# Patient Record
Sex: Male | Born: 1940
Health system: Southern US, Community
[De-identification: ages and names within clinical notes are randomized; demographics above are authoritative.]

## PROBLEM LIST (undated history)

## (undated) ENCOUNTER — Ambulatory Visit (HOSPITAL_COMMUNITY): Disposition: A | Discharge status: Home / Self Care | Payer: Self-pay

## (undated) DIAGNOSIS — K635 Polyp of colon: Secondary | ICD-10-CM

## (undated) DIAGNOSIS — R3129 Other microscopic hematuria: Secondary | ICD-10-CM

## (undated) DIAGNOSIS — N3281 Overactive bladder: Secondary | ICD-10-CM

## (undated) DIAGNOSIS — Z720 Tobacco use: Secondary | ICD-10-CM

## (undated) DIAGNOSIS — K5900 Constipation, unspecified: Secondary | ICD-10-CM

## (undated) DIAGNOSIS — IMO0002 Reserved for concepts with insufficient information to code with codable children: Secondary | ICD-10-CM

## (undated) DIAGNOSIS — C801 Malignant (primary) neoplasm, unspecified: Secondary | ICD-10-CM

## (undated) DIAGNOSIS — H269 Unspecified cataract: Secondary | ICD-10-CM

## (undated) DIAGNOSIS — E785 Hyperlipidemia, unspecified: Secondary | ICD-10-CM

## (undated) DIAGNOSIS — I10 Essential (primary) hypertension: Secondary | ICD-10-CM

## (undated) DIAGNOSIS — N400 Enlarged prostate without lower urinary tract symptoms: Secondary | ICD-10-CM

## (undated) DIAGNOSIS — E538 Deficiency of other specified B group vitamins: Secondary | ICD-10-CM

## (undated) DIAGNOSIS — D696 Thrombocytopenia, unspecified: Secondary | ICD-10-CM

## (undated) DIAGNOSIS — E7800 Pure hypercholesterolemia, unspecified: Secondary | ICD-10-CM

## (undated) HISTORY — DX: Reserved for concepts with insufficient information to code with codable children: IMO0002

## (undated) HISTORY — DX: Essential (primary) hypertension: I10

## (undated) HISTORY — PX: COLONOSCOPY: SHX174

## (undated) HISTORY — DX: Overactive bladder: N32.81

## (undated) HISTORY — DX: Constipation, unspecified: K59.00

## (undated) HISTORY — DX: Deficiency of other specified B group vitamins: E53.8

## (undated) HISTORY — DX: Tobacco use: Z72.0

## (undated) HISTORY — DX: Malignant (primary) neoplasm, unspecified: C80.1

## (undated) HISTORY — DX: Hyperlipidemia, unspecified: E78.5

## (undated) HISTORY — DX: Thrombocytopenia, unspecified: D69.6

## (undated) HISTORY — DX: Pure hypercholesterolemia, unspecified: E78.00

## (undated) HISTORY — DX: Benign prostatic hyperplasia without lower urinary tract symptoms: N40.0

## (undated) HISTORY — PX: POLYPECTOMY: SHX149

## (undated) HISTORY — DX: Polyp of colon: K63.5

## (undated) HISTORY — DX: Other microscopic hematuria: R31.29

## (undated) HISTORY — DX: Unspecified cataract: H26.9

## (undated) HISTORY — PX: NOSE SURGERY: SHX723

---

## 1978-06-16 HISTORY — PX: HERNIA REPAIR: SHX51

## 1998-09-11 ENCOUNTER — Ambulatory Visit (HOSPITAL_COMMUNITY): Admission: RE | Admit: 1998-09-11 | Discharge: 1998-09-11 | Payer: Self-pay

## 2001-08-20 ENCOUNTER — Encounter: Payer: Self-pay | Admitting: Family Medicine

## 2001-08-20 ENCOUNTER — Ambulatory Visit (HOSPITAL_COMMUNITY): Admission: RE | Admit: 2001-08-20 | Discharge: 2001-08-20 | Payer: Self-pay | Admitting: Family Medicine

## 2003-10-03 ENCOUNTER — Ambulatory Visit (HOSPITAL_COMMUNITY): Admission: RE | Admit: 2003-10-03 | Discharge: 2003-10-03 | Payer: Self-pay | Admitting: Family Medicine

## 2004-01-26 ENCOUNTER — Ambulatory Visit (HOSPITAL_COMMUNITY): Admission: RE | Admit: 2004-01-26 | Discharge: 2004-01-26 | Payer: Self-pay | Admitting: Family Medicine

## 2004-02-23 ENCOUNTER — Ambulatory Visit (HOSPITAL_COMMUNITY): Admission: RE | Admit: 2004-02-23 | Discharge: 2004-02-23 | Payer: Self-pay | Admitting: Gastroenterology

## 2004-02-23 ENCOUNTER — Encounter (INDEPENDENT_AMBULATORY_CARE_PROVIDER_SITE_OTHER): Payer: Self-pay | Admitting: Specialist

## 2005-06-16 HISTORY — PX: CATARACT EXTRACTION: SUR2

## 2006-10-08 HISTORY — PX: SKIN BIOPSY: SHX1

## 2006-11-10 HISTORY — PX: OTHER SURGICAL HISTORY: SHX169

## 2006-11-24 ENCOUNTER — Encounter: Admission: RE | Admit: 2006-11-24 | Discharge: 2006-12-10 | Payer: Self-pay | Admitting: Orthopedic Surgery

## 2008-02-28 HISTORY — PX: KNEE SURGERY: SHX244

## 2008-03-16 ENCOUNTER — Encounter: Admission: RE | Admit: 2008-03-16 | Discharge: 2008-05-04 | Payer: Self-pay | Admitting: Orthopedic Surgery

## 2010-11-01 NOTE — Op Note (Signed)
NAME:  Lonnie Hurley, Lonnie Hurley                        ACCOUNT NO.:  [id number]   MEDICAL RECORD NO.:  [id number]                   PATIENT TYPE:  AMB   LOCATION:  ENDO                                 FACILITY:  [facility]   PHYSICIAN:  John C. Madilyn Fireman, M.D.                 DATE OF BIRTH:  1941/06/14   DATE OF PROCEDURE:  02/23/2004  DATE OF DISCHARGE:                                 OPERATIVE REPORT   PROCEDURE:  Colonoscopy with polypectomy.   INDICATIONS FOR PROCEDURE:  Heme-positive stools.   PROCEDURE:  The patient was placed in the left lateral decubitus position  and placed on the pulse monitor with continuous low flow oxygen delivered by  nasal cannula.  He was sedated with 87.5 mcg IV fentanyl and 9 mg IV Versed.  The Olympus video colonoscope is inserted into the rectum and advanced to  the cecum, confirmed by transillumination at McBurney's point and  visualization of the ileocecal valve and appendiceal orifice.  Prep is  excellent.  The cecum, ascending, transverse, descending, and sigmoid colon  all appeared normal with no masses, polyps, diverticula, or other mucosal  abnormalities.  At the rectosigmoid junction, there was a smaller polyp that  was fulgurated by hot biopsy.  In the rectum at approximately 10 cm, there  was a second polyp, approximately 5 mm in diameter, that was also fulgurated  by hot biopsy.  The remainder of the rectum appeared normal.  On retroflexed  view, the anus revealed no obvious internal hemorrhoids.  The scope was then  withdrawn, and the patient returned to the recovery room in stable  condition.  He tolerated the procedure well, and there were no immediate  complications.   IMPRESSION:  Rectosigmoid and rectal polyps.   PLAN:  Await histology to determine method and interval for future colon  screening.                                               John C. Madilyn Fireman, M.D.    [facility]MED[facility]  D:  02/23/2004  T:  02/24/2004  Job:  [id number]   cc:   Ernestina Penna, M.D.  [address] [place]  [place] [address]  Fax: [phone]

## 2010-11-08 ENCOUNTER — Encounter: Payer: Self-pay | Admitting: Nurse Practitioner

## 2011-11-21 ENCOUNTER — Telehealth: Payer: Self-pay | Admitting: Oncology

## 2011-11-21 NOTE — Telephone Encounter (Signed)
pt called Lonnie Hurley and scheduled new pt appt for 12/03/2011.  will fax over a letter to Dr. Caryn Bee

## 2011-11-24 ENCOUNTER — Telehealth: Payer: Self-pay | Admitting: Oncology

## 2011-11-24 NOTE — Telephone Encounter (Signed)
Referred by Helene Kelp, PA Dx- Mild-Chronic Thrombocytome

## 2011-12-02 DIAGNOSIS — D696 Thrombocytopenia, unspecified: Secondary | ICD-10-CM | POA: Insufficient documentation

## 2011-12-02 NOTE — Progress Notes (Signed)
Called patient to remind him of appointment tomorrow (12/03/11); patient verbalized understanding.

## 2011-12-02 NOTE — Patient Instructions (Addendum)
Issue:  Low platelet count (thrombocytopenia). Cause:  Most likely due to medication (simvastatin).  I need to rule out other causes such as VitB12 and folate deficiency. Risk of bleeding is low when platelet >30.   Recommend:  [facility] blood count [facility] in about 3 and 6 months.  Follow up in about 9 months.  In the future, if your platelet count decreases to <80, or if you develop anemia or low white count, I may consider bone marrow biopsy to rule out bone marrow failure.

## 2011-12-03 ENCOUNTER — Telehealth: Payer: Self-pay | Admitting: Oncology

## 2011-12-03 ENCOUNTER — Encounter: Payer: Self-pay | Admitting: Oncology

## 2011-12-03 ENCOUNTER — Ambulatory Visit (HOSPITAL_BASED_OUTPATIENT_CLINIC_OR_DEPARTMENT_OTHER): Payer: Medicare Other

## 2011-12-03 ENCOUNTER — Ambulatory Visit (HOSPITAL_BASED_OUTPATIENT_CLINIC_OR_DEPARTMENT_OTHER): Payer: Medicare Other | Admitting: Oncology

## 2011-12-03 ENCOUNTER — Other Ambulatory Visit (HOSPITAL_BASED_OUTPATIENT_CLINIC_OR_DEPARTMENT_OTHER): Payer: Medicare Other | Admitting: Lab

## 2011-12-03 VITALS — BP 124/73 | HR 75 | Temp 96.8°F | Ht 65.0 in | Wt 227.1 lb

## 2011-12-03 DIAGNOSIS — I10 Essential (primary) hypertension: Secondary | ICD-10-CM

## 2011-12-03 DIAGNOSIS — R5383 Other fatigue: Secondary | ICD-10-CM

## 2011-12-03 DIAGNOSIS — E785 Hyperlipidemia, unspecified: Secondary | ICD-10-CM

## 2011-12-03 DIAGNOSIS — D696 Thrombocytopenia, unspecified: Secondary | ICD-10-CM

## 2011-12-03 DIAGNOSIS — M949 Disorder of cartilage, unspecified: Secondary | ICD-10-CM

## 2011-12-03 LAB — CBC WITH DIFFERENTIAL/PLATELET
BASO%: 0.9 % (ref 0.0–2.0)
Eosinophils Absolute: 0.2 10*3/uL (ref 0.0–0.5)
HCT: 40.9 % (ref 38.4–49.9)
HGB: 14.1 g/dL (ref 13.0–17.1)
MCHC: 34.6 g/dL (ref 32.0–36.0)
MONO#: 0.6 10*3/uL (ref 0.1–0.9)
NEUT#: 3.4 10*3/uL (ref 1.5–6.5)
NEUT%: 51.9 % (ref 39.0–75.0)
WBC: 6.5 10*3/uL (ref 4.0–10.3)
lymph#: 2.3 10*3/uL (ref 0.9–3.3)

## 2011-12-03 LAB — VITAMIN B12: Vitamin B-12: 326 pg/mL (ref 211–911)

## 2011-12-03 LAB — TSH: TSH: 1.706 u[IU]/mL (ref 0.350–4.500)

## 2011-12-03 LAB — CHCC SMEAR

## 2011-12-03 LAB — MORPHOLOGY: PLT EST: ADEQUATE

## 2011-12-03 NOTE — Progress Notes (Signed)
[facility]  Telephone:(336) [phone] Fax:(336) [phone]     INITIAL HEMATOLOGY CONSULTATION    Referral MD:   Dr. Rudi Heap, M.D.   Reason for Referral: thrombocytopenia.   HPI: Lonnie Hurley is a 71 year-old man with history of DJD, HTN, HLP on Simvastatin, BPH.  There was no old record provided.  His most recent CBC on 11/13/2011 showed WBC 6.2; Hgb 14; Plt 118.  CMET was completely normal including his LFT.  He was thus kindly referred for evaluation.  Lonnie Hurley presented [facility].  He has chronic bone pain in his bilateral knees (Left worse than right).  He also had mild bone pain diffusely.   He denied fever, erythema, redness in bilateral knees.   Patient denies fever, anorexia, weight loss, fatigue, headache, visual changes, confusion, drenching night sweats, palpable lymph node swelling, mucositis, odynophagia, dysphagia, nausea vomiting, jaundice, chest pain, palpitation, shortness of breath, dyspnea on exertion, productive cough, gum bleeding, epistaxis, hematemesis, hemoptysis, abdominal pain, abdominal swelling, early satiety, melena, hematochezia, hematuria, skin rash, spontaneous bleeding, joint swelling, heat or cold intolerance, bowel bladder incontinence, back pain, focal motor weakness, paresthesia, depression, suicidal or homocidal ideation, feeling hopelessness.    Past Medical History  Diagnosis Date  . Hypercholesteremia   . Microscopic hematuria     negative work up with Urology in the past.   . Hearing loss   . Tinnitus   . DDD (degenerative disc disease)   . Hypertension   . Colon polyps   . BPH (benign prostatic hypertrophy)     Dr. Earlene Plater / Dr. Etta Grandchild  - Urologist   . Thrombocytopenia   . Vitamin d deficiency   . Hyperlipidemia   . Leukoplakia   . Tobacco abuse   :    Past Surgical History  Procedure Date  . Left knee repair   . Hernia repair 1980  . Nose surgery   :   CURRENT  MEDS: Current Outpatient Prescriptions  Medication Sig Dispense Refill  . Cholecalciferol (VITAMIN D3) 2000 UNITS TABS Take by mouth.        . CVS CALCIUM-MAGNESIUM-ZINC PO Take by mouth daily.      Marland Kitchen ezetimibe (ZETIA) 10 MG tablet Take 10 mg by mouth daily.        Marland Kitchen GLUCOSAMINE PO Take by mouth daily.        . hydrochlorothiazide 25 MG tablet Take 12.5 mg by mouth daily.       . Multiple Vitamins-Minerals (MULTIVITAMIN WITH MINERALS) tablet Take 1 tablet by mouth daily.      . niacin (NIASPAN) 1000 MG CR tablet Take 1,000 mg by mouth at bedtime.        . Omega-3 Fatty Acids (FISH OIL) 1000 MG CAPS Take by mouth.      . ramipril (ALTACE) 10 MG tablet Take 10 mg by mouth at bedtime as needed.        . simvastatin (ZOCOR) 40 MG tablet Take 40 mg by mouth at bedtime.        . Tamsulosin HCl (FLOMAX) 0.4 MG CAPS Take by mouth at bedtime.            Allergies  Allergen Reactions  . Aspirin Other (See Comments)    Causes Blood in urine  . Augmentin Es-600 (Amoxicillin-Pot Clavulanate)   . Crestor (Rosuvastatin Calcium)     Myalgia   . Hydrocodone     Constipation    .  Penicillins   :  Family History  Problem Relation Age of Onset  . Heart failure Father   :  History   Social History  . Marital Status: Single    Spouse Name: N/A    Number of Children: 0  . Years of Education: N/A   Occupational History  .      retired Chartered loss adjuster   Social History Main Topics  . Smoking status: Former Smoker -- 50 years    Types: Cigarettes    Quit date: 08/14/2009  . Smokeless tobacco: Not on file  . Alcohol Use: No  . Drug Use: No  . Sexually Active: Not on file   Other Topics Concern  . Not on file   Social History Narrative  . No narrative on file  :  REVIEW OF SYSTEM:  The rest of the 14-point review of sytem was negative.   Exam: ECOG 0.   General:  Mildly obese man,  in no acute distress.  Eyes:  no scleral icterus.  ENT:  There were no oropharyngeal  lesions.  Neck was without thyromegaly.  Lymphatics:  Negative cervical, supraclavicular or axillary adenopathy.  Respiratory: lungs were clear bilaterally without wheezing or crackles.  Cardiovascular:  Regular rate and rhythm, S1/S2, without murmur, rub or gallop.  There was no pedal edema.  GI:  abdomen was soft, flat, nontender, nondistended, without organomegaly.  Muscoloskeletal:  no spinal tenderness of palpation of vertebral spine.  Skin exam was without echymosis, petichae.  Neuro exam was nonfocal.  Patient was able to get on and off exam table without assistance.  Gait was normal.  Patient was alerted and oriented.  Attention was good.   Language was appropriate.  Mood was normal without depression.  Speech was not pressured.  Thought content was not tangential.    LABS:  Lab Results  Component Value Date   WBC 6.5 12/03/2011   HGB 14.1 12/03/2011   HCT 40.9 12/03/2011   PLT 143 Platelet count consistent in citrate 12/03/2011      Blood smear review:   I personally reviewed the patient's peripheral blood smear today.  There was isocytosis.  There was no peripheral blast.  There was no schistocytosis, spherocytosis, target cell, rouleaux formation, tear drop cell.  There was no giant platelets or platelet clumps.     ASSESSMENT AND PLAN:   1.  Thrombocytopenia:   It is resolved today.   Cause:  Most likely due to medication (simvastatin).  I need to rule out other causes such as VitB12 and folate deficiency.  I sent for these but they came back normal. I cannot rule out early stage myelodysplastic syndrome but this is low on my differential.  I discussed with patient that risk of spontaneous bleeding is low when >30K.   Recommend:  - Continue with Simvastatin for now.  -  Follow blood count here at the Cancer Center in about 3 and 6 months.   - Follow up in about 9 months.  In the future, if your platelet count decreases to <80, or if you develop anemia or low white count, I may  consider bone marrow biopsy to rule out bone marrow failure.   2.  Hyperlipidemia:  Continue Zocor, Zetia, Niacin, and Omega-3.  I do not advocate changing this at this time since his thrombocytopenia has been very mild and has resolved today.   3.  Hypertension:  Well controlled on ramipril.  4.  BPH:  Symptoms improved with  Tamsulosin.    Thank you for this referral.     The length of time of the face-to-face encounter was 30  minutes. More than 50% of time was spent counseling and coordination of care.

## 2011-12-03 NOTE — Telephone Encounter (Signed)
appts made and printed for pt aom °

## 2012-03-04 ENCOUNTER — Other Ambulatory Visit (HOSPITAL_BASED_OUTPATIENT_CLINIC_OR_DEPARTMENT_OTHER): Payer: Medicare Other | Admitting: Lab

## 2012-03-04 DIAGNOSIS — R5383 Other fatigue: Secondary | ICD-10-CM

## 2012-03-04 DIAGNOSIS — R5381 Other malaise: Secondary | ICD-10-CM

## 2012-03-04 DIAGNOSIS — D696 Thrombocytopenia, unspecified: Secondary | ICD-10-CM

## 2012-03-04 LAB — CBC WITH DIFFERENTIAL/PLATELET
BASO%: 0.8 % (ref 0.0–2.0)
Basophils Absolute: 0.1 10*3/uL (ref 0.0–0.1)
EOS%: 2.9 % (ref 0.0–7.0)
HCT: 40.4 % (ref 38.4–49.9)
HGB: 13.7 g/dL (ref 13.0–17.1)
LYMPH%: 36 % (ref 14.0–49.0)
MCH: 30.2 pg (ref 27.2–33.4)
MCHC: 33.9 g/dL (ref 32.0–36.0)
MONO#: 1.1 10*3/uL — ABNORMAL HIGH (ref 0.1–0.9)
NEUT%: 48.7 % (ref 39.0–75.0)
Platelets: 159 10*3/uL (ref 140–400)
lymph#: 3.3 10*3/uL (ref 0.9–3.3)

## 2012-03-09 ENCOUNTER — Telehealth: Payer: Self-pay

## 2012-03-09 NOTE — Telephone Encounter (Signed)
Message copied by Kallie Locks on Tue Mar 09, 2012  3:24 PM ------      Message from: Jethro Bolus T      Created: Tue Mar 09, 2012  9:10 AM       Please call patient. His platelet count has normalized. His previous thrombocytopenia was most likely due to medication-induced such as simvastatin. Since his platelet is normal now; no need to do anything. I recommend to continue observation at this time.

## 2012-06-03 ENCOUNTER — Telehealth: Payer: Self-pay | Admitting: *Deleted

## 2012-06-03 ENCOUNTER — Other Ambulatory Visit (HOSPITAL_BASED_OUTPATIENT_CLINIC_OR_DEPARTMENT_OTHER): Payer: Medicare Other

## 2012-06-03 DIAGNOSIS — R5383 Other fatigue: Secondary | ICD-10-CM

## 2012-06-03 DIAGNOSIS — D696 Thrombocytopenia, unspecified: Secondary | ICD-10-CM

## 2012-06-03 LAB — CBC WITH DIFFERENTIAL/PLATELET
BASO%: 0.9 % (ref 0.0–2.0)
Basophils Absolute: 0.1 10*3/uL (ref 0.0–0.1)
EOS%: 2.9 % (ref 0.0–7.0)
HCT: 43.5 % (ref 38.4–49.9)
HGB: 14.8 g/dL (ref 13.0–17.1)
MCH: 30.6 pg (ref 27.2–33.4)
MCHC: 34 g/dL (ref 32.0–36.0)
MCV: 90 fL (ref 79.3–98.0)
MONO%: 9.4 % (ref 0.0–14.0)
NEUT%: 47.8 % (ref 39.0–75.0)
lymph#: 3.3 10*3/uL (ref 0.9–3.3)

## 2012-06-03 NOTE — Telephone Encounter (Signed)
Message copied by PORTER, Shanta Dorvil C on Thu Jun 03, 2012  5:21 PM ------      Message from: HA, HUAN T      Created: Thu Jun 03, 2012  2:12 PM       Please call Lonnie Hurley.  His mild thrombocytopenia is still there. Not low enough for any work up.  Again, most likely due to medications.  Continue observation.  Thanks. 

## 2012-06-03 NOTE — Telephone Encounter (Signed)
Message copied by Wende Mott on Thu Jun 03, 2012  5:21 PM ------      Message from: HA, Raliegh Ip T      Created: Thu Jun 03, 2012  2:12 PM       Please call pt.  His mild thrombocytopenia is still there. Not low enough for any work up.  Again, most likely due to medications.  Continue observation.  Thanks.

## 2012-06-03 NOTE — Telephone Encounter (Signed)
Called pt w/ lab results,  Relayed Dr. Lodema Pilot message and instructed to keep lab as scheduled in March.  He verbalized understanding.

## 2012-09-01 ENCOUNTER — Encounter: Payer: Self-pay | Admitting: Oncology

## 2012-09-01 ENCOUNTER — Telehealth: Payer: Self-pay | Admitting: Oncology

## 2012-09-01 ENCOUNTER — Ambulatory Visit (HOSPITAL_BASED_OUTPATIENT_CLINIC_OR_DEPARTMENT_OTHER): Payer: Medicare Other | Admitting: Oncology

## 2012-09-01 ENCOUNTER — Other Ambulatory Visit (HOSPITAL_BASED_OUTPATIENT_CLINIC_OR_DEPARTMENT_OTHER): Payer: Medicare Other | Admitting: Lab

## 2012-09-01 VITALS — BP 123/80 | HR 78 | Temp 97.2°F | Resp 22 | Ht 65.0 in | Wt 235.2 lb

## 2012-09-01 DIAGNOSIS — D696 Thrombocytopenia, unspecified: Secondary | ICD-10-CM

## 2012-09-01 DIAGNOSIS — R5383 Other fatigue: Secondary | ICD-10-CM

## 2012-09-01 LAB — CBC WITH DIFFERENTIAL/PLATELET
BASO%: 0.7 % (ref 0.0–2.0)
EOS%: 2.8 % (ref 0.0–7.0)
MCH: 30.5 pg (ref 27.2–33.4)
MCHC: 34.4 g/dL (ref 32.0–36.0)
MCV: 88.4 fL (ref 79.3–98.0)
MONO%: 11.8 % (ref 0.0–14.0)
RBC: 4.76 10*6/uL (ref 4.20–5.82)
RDW: 13.4 % (ref 11.0–14.6)
lymph#: 3 10*3/uL (ref 0.9–3.3)

## 2012-09-01 LAB — COMPREHENSIVE METABOLIC PANEL (CC13)
ALT: 27 U/L (ref 0–55)
Albumin: 3.5 g/dL (ref 3.5–5.0)
Alkaline Phosphatase: 65 U/L (ref 40–150)
CO2: 28 mEq/L (ref 22–29)
Glucose: 106 mg/dl — ABNORMAL HIGH (ref 70–99)
Potassium: 4.4 mEq/L (ref 3.5–5.1)
Sodium: 141 mEq/L (ref 136–145)
Total Bilirubin: 0.77 mg/dL (ref 0.20–1.20)
Total Protein: 6.8 g/dL (ref 6.4–8.3)

## 2012-09-01 LAB — CHCC SMEAR

## 2012-09-01 NOTE — Progress Notes (Signed)
[facility]  Telephone:(336) [phone] Fax:(336) [phone]   OFFICE PROGRESS NOTE   Cc:  Rudi Heap, MD  DIAGNOSIS: Thrombocytopenia  CURRENT THERAPY: Watchful observation.  INTERVAL HISTORY: PADRAIC MARINOS 72 y.o. male returns for routine follow-up by himself. He continues to have chronic bone pain to his bilateral knees. No fatigue, CP, SOB, DOE. Denies any bleeding. No palpable adenopathy.  Patient denies fever, anorexia, weight loss, fatigue, headache, visual changes, confusion, drenching night sweats, palpable lymph node swelling, mucositis, odynophagia, dysphagia, nausea vomiting, jaundice, chest pain, palpitation, shortness of breath, dyspnea on exertion, productive cough, gum bleeding, epistaxis, hematemesis, hemoptysis, abdominal pain, abdominal swelling, early satiety, melena, hematochezia, hematuria, skin rash, spontaneous bleeding, joint swelling, heat or cold intolerance, bowel bladder incontinence, back pain, focal motor weakness, paresthesia, depression, suicidal or homocidal ideation, feeling hopelessness.   Past Medical History  Diagnosis Date  . Hypercholesteremia   . Microscopic hematuria     negative work up with Urology in the past.   . Hearing loss   . Tinnitus   . DDD (degenerative disc disease)   . Hypertension   . Colon polyps   . BPH (benign prostatic hypertrophy)     Dr. Earlene Plater / Dr. Etta Grandchild  - Urologist   . Thrombocytopenia   . Vitamin D deficiency   . Hyperlipidemia   . Leukoplakia   . Tobacco abuse     Past Surgical History  Procedure Laterality Date  . Left knee repair    . Hernia repair  1980  . Nose surgery      Current Outpatient Prescriptions  Medication Sig Dispense Refill  . mirabegron ER (MYRBETRIQ) 25 MG TB24 Take 25 mg by mouth daily.      . Cholecalciferol (VITAMIN D3) 2000 UNITS TABS Take by mouth.        . CVS CALCIUM-MAGNESIUM-ZINC PO Take by mouth daily.      Marland Kitchen ezetimibe (ZETIA) 10 MG tablet Take 10 mg by  mouth daily.        . hydrochlorothiazide 25 MG tablet Take 12.5 mg by mouth daily.       . Multiple Vitamins-Minerals (MULTIVITAMIN WITH MINERALS) tablet Take 1 tablet by mouth daily.      . niacin (NIASPAN) 1000 MG CR tablet Take 1,000 mg by mouth at bedtime.        . Omega-3 Fatty Acids (FISH OIL) 1000 MG CAPS Take by mouth.      . ramipril (ALTACE) 10 MG tablet Take 10 mg by mouth at bedtime as needed.        . simvastatin (ZOCOR) 40 MG tablet Take 40 mg by mouth at bedtime.        . Tamsulosin HCl (FLOMAX) 0.4 MG CAPS Take by mouth at bedtime.         No current facility-administered medications for this visit.    ALLERGIES:  is allergic to aspirin; augmentin es-600; crestor; hydrocodone; and penicillins.  REVIEW OF SYSTEMS:  The rest of the 14-point review of system was negative.   Filed Vitals:   09/01/12 1033  BP: 123/80  Pulse: 78  Temp: 97.2 F (36.2 C)  Resp: 22   Wt Readings from Last 3 Encounters:  09/01/12 235 lb 3.2 oz (106.686 kg)  12/03/11 227 lb 1.6 oz (103.012 kg)   ECOG Performance status: 0  PHYSICAL EXAMINATION:   General:  well-nourished in no acute distress.  Eyes:  no scleral icterus.  ENT:  There were no oropharyngeal lesions.  Neck was without thyromegaly.  Lymphatics:  Negative cervical, supraclavicular or axillary adenopathy.  Respiratory: lungs were clear bilaterally without wheezing or crackles.  Cardiovascular:  Regular rate and rhythm, S1/S2, without murmur, rub or gallop.  There was no pedal edema.  GI:  abdomen was soft, flat, nontender, nondistended, without organomegaly.  Muscoloskeletal:  no spinal tenderness of palpation of vertebral spine.  Skin exam was without echymosis, petichae.  Neuro exam was nonfocal.  Patient was able to get on and off exam table without assistance.  Gait was normal.  Patient was alerted and oriented.  Attention was good.   Language was appropriate.  Mood was normal without depression.  Speech was not pressured.  Thought  content was not tangential.     LABORATORY/RADIOLOGY DATA:  Lab Results  Component Value Date   WBC 8.1 09/01/2012   HGB 14.5 09/01/2012   HCT 42.1 09/01/2012   PLT 132* 09/01/2012   GLUCOSE 106* 09/01/2012   ALKPHOS 65 09/01/2012   ALT 27 09/01/2012   AST 22 09/01/2012   NA 141 09/01/2012   K 4.4 09/01/2012   CL 106 09/01/2012   CREATININE 1.1 09/01/2012   BUN 12.0 09/01/2012   CO2 28 09/01/2012    ASSESSMENT AND PLAN:   1. Thrombocytopenia:   Cause: Most likely due to medication (simvastatin). I cannot rule out early stage myelodysplastic syndrome but this is low on my differential. I discussed with patient that risk of spontaneous bleeding is low when >30K.  Recommend:  - Continue with Simvastatin for now.  - Follow blood count here at the Cancer Center in about 3 and 6 and 9 months.  - Follow up in about 1 year. In the future, if your platelet count decreases to <80, or if you develop anemia or low white count, I may consider bone marrow biopsy to rule out bone marrow failure.  2. Hyperlipidemia: Continue Zocor, Zetia, Niacin, and Omega-3. I do not advocate changing this at this time since his thrombocytopenia has been very mild and has resolved today.  3. Hypertension: Well controlled on ramipril.  4. BPH: Symptoms improved with Tamsulosin.       The length of time of the face-to-face encounter was 15 minutes. More than 50% of time was spent counseling and coordination of care.

## 2012-10-14 ENCOUNTER — Ambulatory Visit: Payer: Self-pay | Admitting: Nurse Practitioner

## 2012-11-15 ENCOUNTER — Other Ambulatory Visit (INDEPENDENT_AMBULATORY_CARE_PROVIDER_SITE_OTHER): Payer: Medicare Other

## 2012-11-15 DIAGNOSIS — I10 Essential (primary) hypertension: Secondary | ICD-10-CM

## 2012-11-15 DIAGNOSIS — E785 Hyperlipidemia, unspecified: Secondary | ICD-10-CM

## 2012-11-15 DIAGNOSIS — E559 Vitamin D deficiency, unspecified: Secondary | ICD-10-CM

## 2012-11-15 LAB — COMPLETE METABOLIC PANEL WITH GFR
AST: 20 U/L (ref 0–37)
Alkaline Phosphatase: 52 U/L (ref 39–117)
BUN: 16 mg/dL (ref 6–23)
GFR, Est Non African American: 83 mL/min
Glucose, Bld: 104 mg/dL — ABNORMAL HIGH (ref 70–99)
Total Bilirubin: 0.7 mg/dL (ref 0.3–1.2)

## 2012-11-15 NOTE — Progress Notes (Signed)
Patient came in for labs only.

## 2012-11-16 LAB — NMR LIPOPROFILE WITH LIPIDS
Cholesterol, Total: 120 mg/dL (ref <200)
HDL Size: 9.2 nm (ref >=9.2)
HDL-C: 43 mg/dL (ref >=40)
LDL (calc): 54 mg/dL (ref <100)
LDL Particle Number: 950 nmol/L (ref <1000)
LP-IR Score: 50 — ABNORMAL HIGH (ref <=45)
Triglycerides: 113 mg/dL (ref <150)
VLDL Size: 48 nm — ABNORMAL HIGH (ref <=46.6)

## 2012-11-16 LAB — VITAMIN D 25 HYDROXY (VIT D DEFICIENCY, FRACTURES): Vit D, 25-Hydroxy: 38 ng/mL (ref 30–89)

## 2012-11-17 ENCOUNTER — Ambulatory Visit (INDEPENDENT_AMBULATORY_CARE_PROVIDER_SITE_OTHER): Payer: Medicare Other | Admitting: Nurse Practitioner

## 2012-11-17 ENCOUNTER — Encounter: Payer: Self-pay | Admitting: Nurse Practitioner

## 2012-11-17 VITALS — BP 124/79 | HR 82 | Temp 96.7°F | Ht 65.0 in | Wt 239.0 lb

## 2012-11-17 DIAGNOSIS — M5136 Other intervertebral disc degeneration, lumbar region: Secondary | ICD-10-CM | POA: Insufficient documentation

## 2012-11-17 DIAGNOSIS — I10 Essential (primary) hypertension: Secondary | ICD-10-CM

## 2012-11-17 DIAGNOSIS — IMO0002 Reserved for concepts with insufficient information to code with codable children: Secondary | ICD-10-CM

## 2012-11-17 DIAGNOSIS — E785 Hyperlipidemia, unspecified: Secondary | ICD-10-CM

## 2012-11-17 NOTE — Progress Notes (Signed)
Subjective:    Patient ID: Lonnie Hurley, male    DOB: [birth year], 72 y.o.   MRN: [id number]  Hypertension This is a chronic problem. The current episode started more than 1 year ago. The problem has been resolved since onset. The problem is controlled. Pertinent negatives include no chest pain, malaise/fatigue, peripheral edema or shortness of breath. There are no associated agents to hypertension. Risk factors for coronary artery disease include dyslipidemia, obesity, post-menopausal state and family history. Past treatments include ACE inhibitors and diuretics. The current treatment provides moderate improvement. Compliance problems include diet and exercise.   Hyperlipidemia This is a chronic problem. The current episode started more than 1 year ago. The problem is controlled. Recent lipid tests were reviewed and are normal. There are no known factors aggravating his hyperlipidemia. Pertinent negatives include no chest pain, leg pain, myalgias or shortness of breath. Current antihyperlipidemic treatment includes ezetimibe and statins. The current treatment provides moderate improvement of lipids. Compliance problems include adherence to diet and adherence to exercise.  Risk factors for coronary artery disease include male sex, obesity and hypertension.  BPH flomax daily- no c/o uregncy or dificulty with stream- No nocturia    Review of Systems  Constitutional: Negative for malaise/fatigue.  Respiratory: Negative for shortness of breath.   Cardiovascular: Negative for chest pain.  Musculoskeletal: Negative for myalgias.  All other systems reviewed and are negative.       Objective:   Physical Exam  Constitutional: He is oriented to person, place, and time. He appears well-developed and well-nourished.  HENT:  Head: Normocephalic.  Right Ear: External ear normal.  Left Ear: External ear normal.  Nose: Nose normal.  Mouth/Throat: Oropharynx is clear and moist.  Eyes: EOM are  normal. Pupils are equal, round, and reactive to light.  Neck: Normal range of motion. Neck supple. No thyromegaly present.  Cardiovascular: Normal rate, regular rhythm, normal heart sounds and intact distal pulses.   No murmur heard. Pulmonary/Chest: Effort normal and breath sounds normal. He has no wheezes. He has no rales.  Abdominal: Soft. Bowel sounds are normal.  Genitourinary:  Patient refuses DRE   Musculoskeletal: Normal range of motion.  Neurological: He is alert and oriented to person, place, and time.  Skin: Skin is warm and dry.  Psychiatric: He has a normal mood and affect. His behavior is normal. Judgment and thought content normal.    BP 124/79  Pulse 82  Temp(Src) 96.7 F (35.9 C) (Oral)  Ht 5\' 5"  (1.651 m)  Wt 239 lb (108.41 kg)  BMI 39.77 kg/m2       Assessment & Plan:   1. Hypertension   2. Hyperlipidemia   3. DDD (degenerative disc disease)    Labs reviewed at appointment   Medication List       These changes are accurate as of: 11/17/2012  2:55 PM. If you have any questions, ask your nurse or doctor.          TAKE these medications       CVS CALCIUM-MAGNESIUM-ZINC PO  Take by mouth daily.     ezetimibe 10 MG tablet  Commonly known as:  ZETIA  Take 10 mg by mouth daily.     Fish Oil 1000 MG Caps  Take by mouth.     FLOMAX 0.4 MG Caps  Generic drug:  tamsulosin  Take by mouth at bedtime.     hydrochlorothiazide 25 MG tablet  Commonly known as:  HYDRODIURIL  Take 12.5 mg by mouth  daily.     multivitamin with minerals tablet  Take 1 tablet by mouth daily.     MYRBETRIQ 25 MG Tb24  Generic drug:  mirabegron ER  Take 25 mg by mouth daily.     niacin 1000 MG CR tablet  Commonly known as:  NIASPAN  Take 1,000 mg by mouth at bedtime.     ramipril 10 MG tablet  Commonly known as:  ALTACE  Take 10 mg by mouth at bedtime as needed.     simvastatin 40 MG tablet  Commonly known as:  ZOCOR  Take 40 mg by mouth at bedtime.      Vitamin D3 2000 UNITS Tabs  Take by mouth.       Continue all meds  Labs reviewed at appointment Diet encouraged [place] cards given to patient Mary-Margaret Daphine Deutscher, FNP

## 2012-11-17 NOTE — Patient Instructions (Addendum)
Health Maintenance, Males A healthy lifestyle and preventative care can promote health and wellness.  Maintain regular health, dental, and eye exams.  Eat a healthy diet. Foods like vegetables, fruits, whole grains, low-fat dairy products, and lean protein foods contain the nutrients you need without too many calories. Decrease your intake of foods high in solid fats, added sugars, and salt. Get information about a proper diet from your caregiver, if necessary.  Regular physical exercise is one of the most important things you can do for your health. Most adults should get at least 150 minutes of moderate-intensity exercise (any activity that increases your heart rate and causes you to sweat) each week. In addition, most adults need muscle-strengthening exercises on 2 or more days a week.   Maintain a healthy weight. The body mass index (BMI) is a screening tool to identify possible weight problems. It provides an estimate of body fat based on height and weight. Your caregiver can help determine your BMI, and can help you achieve or maintain a healthy weight. For adults 20 years and older:  A BMI below 18.5 is considered underweight.  A BMI of 18.5 to 24.9 is normal.  A BMI of 25 to 29.9 is considered overweight.  A BMI of 30 and above is considered obese.  Maintain normal blood lipids and cholesterol by exercising and minimizing your intake of saturated fat. Eat a balanced diet with plenty of fruits and vegetables. Blood tests for lipids and cholesterol should begin at age 20 and be repeated every 5 years. If your lipid or cholesterol levels are high, you are over 50, or you are a high risk for heart disease, you may need your cholesterol levels checked more frequently.Ongoing high lipid and cholesterol levels should be treated with medicines, if diet and exercise are not effective.  If you smoke, find out from your caregiver how to quit. If you do not use tobacco, do not start.  If you  choose to drink alcohol, do not exceed 2 drinks per day. One drink is considered to be 12 ounces (355 mL) of beer, 5 ounces (148 mL) of wine, or 1.5 ounces (44 mL) of liquor.  Avoid use of street drugs. Do not share needles with anyone. Ask for help if you need support or instructions about stopping the use of drugs.  High blood pressure causes heart disease and increases the risk of stroke. Blood pressure should be checked at least every 1 to 2 years. Ongoing high blood pressure should be treated with medicines if weight loss and exercise are not effective.  If you are 45 to 70 years old, ask your caregiver if you should take aspirin to prevent heart disease.  Diabetes screening involves taking a blood sample to check your fasting blood sugar level. This should be done once every 3 years, after age 45, if you are within normal weight and without risk factors for diabetes. Testing should be considered at a younger age or be carried out more frequently if you are overweight and have at least 1 risk factor for diabetes.  Colorectal cancer can be detected and often prevented. Most routine colorectal cancer screening begins at the age of 50 and continues through age 75. However, your caregiver may recommend screening at an earlier age if you have risk factors for colon cancer. On a yearly basis, your caregiver may provide home test kits to check for hidden blood in the stool. Use of a small camera at the end of a tube,   to directly examine the colon (sigmoidoscopy or colonoscopy), can detect the earliest forms of colorectal cancer. Talk to your caregiver about this at age 50, when routine screening begins. Direct examination of the colon should be repeated every 5 to 10 years through age 75, unless early forms of pre-cancerous polyps or small growths are found.  Hepatitis C blood testing is recommended for all people born from 1945 through 1965 and any individual with known risks for hepatitis C.  Healthy  men should no longer receive prostate-specific antigen (PSA) blood tests as part of routine cancer screening. Consult with your caregiver about prostate cancer screening.  Testicular cancer screening is not recommended for adolescents or adult males who have no symptoms. Screening includes self-exam, caregiver exam, and other screening tests. Consult with your caregiver about any symptoms you have or any concerns you have about testicular cancer.  Practice safe sex. Use condoms and avoid high-risk sexual practices to reduce the spread of sexually transmitted infections (STIs).  Use sunscreen with a sun protection factor (SPF) of 30 or greater. Apply sunscreen liberally and repeatedly throughout the day. You should seek shade when your shadow is shorter than you. Protect yourself by wearing long sleeves, pants, a wide-brimmed hat, and sunglasses year round, whenever you are outdoors.  Notify your caregiver of new moles or changes in moles, especially if there is a change in shape or color. Also notify your caregiver if a mole is larger than the size of a pencil eraser.  A one-time screening for abdominal aortic aneurysm (AAA) and surgical repair of large AAAs by sound wave imaging (ultrasonography) is recommended for ages 65 to 75 years who are current or former smokers.  Stay current with your immunizations. Document Released: 11/29/2007 Document Revised: 08/25/2011 Document Reviewed: 10/28/2010 Exit[employer] Patient Information 2014 [employer].  

## 2012-11-19 ENCOUNTER — Other Ambulatory Visit (INDEPENDENT_AMBULATORY_CARE_PROVIDER_SITE_OTHER): Payer: Medicare Other

## 2012-11-19 DIAGNOSIS — Z1212 Encounter for screening for malignant neoplasm of rectum: Secondary | ICD-10-CM

## 2012-12-02 ENCOUNTER — Other Ambulatory Visit (HOSPITAL_BASED_OUTPATIENT_CLINIC_OR_DEPARTMENT_OTHER): Payer: Medicare Other

## 2012-12-02 DIAGNOSIS — D696 Thrombocytopenia, unspecified: Secondary | ICD-10-CM

## 2012-12-02 LAB — CBC WITH DIFFERENTIAL/PLATELET
BASO%: 0.7 % (ref 0.0–2.0)
EOS%: 2.3 % (ref 0.0–7.0)
HCT: 41.2 % (ref 38.4–49.9)
LYMPH%: 33.2 % (ref 14.0–49.0)
MCH: 31 pg (ref 27.2–33.4)
MCHC: 35.4 g/dL (ref 32.0–36.0)
MCV: 87.3 fL (ref 79.3–98.0)
MONO#: 1 10*3/uL — ABNORMAL HIGH (ref 0.1–0.9)
MONO%: 13.1 % (ref 0.0–14.0)
NEUT%: 50.7 % (ref 39.0–75.0)
Platelets: 134 10*3/uL — ABNORMAL LOW (ref 140–400)

## 2012-12-06 ENCOUNTER — Encounter: Payer: Self-pay | Admitting: Physician Assistant

## 2012-12-06 ENCOUNTER — Ambulatory Visit (INDEPENDENT_AMBULATORY_CARE_PROVIDER_SITE_OTHER): Payer: Medicare Other | Admitting: Physician Assistant

## 2012-12-06 ENCOUNTER — Telehealth: Payer: Self-pay

## 2012-12-06 VITALS — BP 140/94 | HR 80 | Temp 96.8°F | Ht 65.0 in | Wt 227.0 lb

## 2012-12-06 DIAGNOSIS — W57XXXA Bitten or stung by nonvenomous insect and other nonvenomous arthropods, initial encounter: Secondary | ICD-10-CM

## 2012-12-06 MED ORDER — DOXYCYCLINE HYCLATE 100 MG PO TABS: 100.0000 mg | ORAL_TABLET | Freq: Two times a day (BID) | ORAL | Status: DC

## 2012-12-06 NOTE — Patient Instructions (Signed)
Deer Tick Bite Deer ticks are brown arachnids (spider family) that vary in size from as small as the head of a pin to 1/4 inch (1/2 cm) diameter. They thrive in wooded areas. Deer are the preferred host of adult deer ticks. Small rodents are the host of young ticks (nymphs). When a person walks in a field or wooded area, young and adult ticks in the surrounding grass and vegetation can attach themselves to the skin. They can suck blood for hours to days if unnoticed. Ticks are found all over the [place] Some ticks carry a specific bacteria (Borrelia burgdorferi) that causes an infection called Lyme disease. The bacteria is typically passed into a person during the blood sucking process. This happens after the tick has been attached for at least a number of hours. While ticks can be found all over the [place] those carrying the bacteria that causes Lyme disease are most common in [place] and the [place]. Only a small proportion of ticks in these areas carry the Lyme disease bacteria and cause human infections. Ticks usually attach to warm spots on the body, such as the:  Head.  Back.  Neck.  Armpits.  Groin. SYMPTOMS  Most of the time, a deer tick bite will not be felt. You may or may not see the attached tick. You may notice mild irritation or redness around the bite site. If the deer tick passes the Lyme disease bacteria to a person, a round, red rash may be noticed 2 to 3 days after the bite. The rash may be clear in the middle, like a bull's-eye or target. If not treated, other symptoms may develop several days to weeks after the onset of the rash. These symptoms may include:  New rash lesions.  Fatigue and weakness.  General ill feeling and achiness.  Chills.  Headache and neck pain.  Swollen lymph glands.  Sore muscles and joints. 5 to 15% of untreated people with Lyme disease may develop more severe illnesses after several weeks to months. This may include inflammation of the  brain lining (meningitis), nerve palsies, an abnormal heartbeat, or severe muscle and joint pain and inflammation (myositis or arthritis). DIAGNOSIS   Physical exam and medical history.  Viewing the tick if it was saved for confirmation.  Blood tests (to check or confirm the presence of Lyme disease). TREATMENT  Most ticks do not carry disease. If found, an attached tick should be removed using tweezers. Tweezers should be placed under the body of the tick so it is removed by its attachment parts (pincers). If there are signs or symptoms of being sick, or Lyme disease is confirmed, medicines (antibiotics) that kill germs are usually prescribed. In more severe cases, antibiotics may be given through an intravenous (IV) access. HOME CARE INSTRUCTIONS   Always remove ticks with tweezers. Do not use petroleum jelly or other methods to kill or remove the tick. Slide the tweezers under the body and pull out as much as you can. If you are not sure what it is, save it in a jar and show your caregiver.  Once you remove the tick, the skin will heal on its own. Wash your hands and the affected area with water and soap. You may place a bandage on the affected area.  Take medicine as directed. You may be advised to take a full course of antibiotics.  Follow up with your caregiver as recommended. FINDING OUT THE RESULTS OF YOUR TEST Not all test results are available   during your visit. If your test results are not back during the visit, make an appointment with your caregiver to find out the results. Do not assume everything is normal if you have not heard from your caregiver or the medical facility. It is important for you to follow up on all of your test results. PROGNOSIS  If Lyme disease is confirmed, early treatment with antibiotics is very effective. Following preventive guidelines is important since it is possible to get the disease more than once. PREVENTION   Wear long sleeves and long pants in  wooded or grassy areas. Tuck your pants into your socks.  Use an insect repellent while hiking.  Check yourself, your children, and your pets regularly for ticks after playing outside.  Clear piles of leaves or brush from your yard. Ticks might live there. SEEK MEDICAL CARE IF:   You or your child has an oral temperature above 102 F (38.9 C).  You develop a severe headache following the bite.  You feel generally ill.  You notice a rash.  You are having trouble removing the tick.  The bite area has red skin or yellow drainage. SEEK IMMEDIATE MEDICAL CARE IF:   Your face is weak and droopy or you have other neurological symptoms.  You have severe joint pain or weakness. MAKE SURE YOU:   Understand these instructions.  Will watch your condition.  Will get help right away if you are not doing well or get worse. FOR MORE INFORMATION Centers for Disease Control and Prevention: www.cdc.gov American Academy [employer]: www.aafp.org Document Released: 08/27/2009 Document Revised: 08/25/2011 Document Reviewed: 08/27/2009 ExitCare Patient Information 2014 [employer].  

## 2012-12-06 NOTE — Telephone Encounter (Signed)
Message copied by Kallie Locks on Mon Dec 06, 2012 10:19 AM ------      Message from: Clenton Pare R      Created: Thu Dec 02, 2012 11:09 AM       Please call pt. Plt are stable. Recommend continued observation. ------

## 2012-12-06 NOTE — Progress Notes (Signed)
Subjective:     Patient ID: CORINTHIAN MIZRAHI, male   DOB: 09-27-1940, 72 y.o.   MRN: [id number]  HPI Pt with a tick bite to the R post leg He does not know how long attached Noticed irritation to the area and when he scratched then noticed tick Pt states area will not heal Also with rash over the last several days   Review of Systems  All other systems reviewed and are negative.       Objective:   Physical Exam  Nursing note and vitals reviewed. Erythem bite to the prox R post thigh + surrounding induration No drainage from site + erythem rash to distal upper ext      Assessment:     Tick Bite    Plan:     Doxycyline rx- sun precaut given Keep area clean and dry F/U prn

## 2013-01-10 ENCOUNTER — Emergency Department (HOSPITAL_COMMUNITY)
Admission: EM | Admit: 2013-01-10 | Discharge: 2013-01-10 | Disposition: A | Discharge status: Home / Self Care | Payer: Medicare Other | Attending: Emergency Medicine | Admitting: Emergency Medicine

## 2013-01-10 ENCOUNTER — Encounter (HOSPITAL_COMMUNITY): Payer: Self-pay | Admitting: Cardiology

## 2013-01-10 ENCOUNTER — Ambulatory Visit: Payer: Medicare Other | Admitting: Family Medicine

## 2013-01-10 DIAGNOSIS — S91009A Unspecified open wound, unspecified ankle, initial encounter: Secondary | ICD-10-CM | POA: Insufficient documentation

## 2013-01-10 DIAGNOSIS — W540XXA Bitten by dog, initial encounter: Secondary | ICD-10-CM | POA: Insufficient documentation

## 2013-01-10 DIAGNOSIS — Z87891 Personal history of nicotine dependence: Secondary | ICD-10-CM | POA: Insufficient documentation

## 2013-01-10 DIAGNOSIS — Z23 Encounter for immunization: Secondary | ICD-10-CM | POA: Insufficient documentation

## 2013-01-10 DIAGNOSIS — I10 Essential (primary) hypertension: Secondary | ICD-10-CM | POA: Insufficient documentation

## 2013-01-10 DIAGNOSIS — Z79899 Other long term (current) drug therapy: Secondary | ICD-10-CM | POA: Insufficient documentation

## 2013-01-10 DIAGNOSIS — S81009A Unspecified open wound, unspecified knee, initial encounter: Secondary | ICD-10-CM | POA: Insufficient documentation

## 2013-01-10 DIAGNOSIS — Y990 Civilian activity done for income or pay: Secondary | ICD-10-CM | POA: Insufficient documentation

## 2013-01-10 DIAGNOSIS — Y9289 Other specified places as the place of occurrence of the external cause: Secondary | ICD-10-CM | POA: Insufficient documentation

## 2013-01-10 DIAGNOSIS — S81851A Open bite, right lower leg, initial encounter: Secondary | ICD-10-CM

## 2013-01-10 DIAGNOSIS — E785 Hyperlipidemia, unspecified: Secondary | ICD-10-CM | POA: Insufficient documentation

## 2013-01-10 MED ORDER — RABIES IMMUNE GLOBULIN 150 UNIT/ML IM INJ
20.0000 [IU]/kg | INJECTION | Freq: Once | INTRAMUSCULAR | Status: AC
  Administered 2013-01-10: 2025 [IU]
  Filled 2013-01-10: qty 13.5

## 2013-01-10 MED ORDER — RABIES VACCINE, PCEC IM SUSR
1.0000 mL | Freq: Once | INTRAMUSCULAR | Status: AC
  Administered 2013-01-10: 1 mL via INTRAMUSCULAR
  Filled 2013-01-10: qty 1

## 2013-01-10 MED ORDER — TETANUS-DIPHTH-ACELL PERTUSSIS 5-2.5-18.5 LF-MCG/0.5 IM SUSP
0.5000 mL | Freq: Once | INTRAMUSCULAR | Status: AC
  Administered 2013-01-10: 0.5 mL via INTRAMUSCULAR
  Filled 2013-01-10: qty 0.5

## 2013-01-10 MED ORDER — AMOXICILLIN-POT CLAVULANATE 875-125 MG PO TABS
1.0000 | ORAL_TABLET | Freq: Once | ORAL | Status: AC
  Administered 2013-01-10: 1 via ORAL
  Filled 2013-01-10: qty 1

## 2013-01-10 MED ORDER — AMOXICILLIN-POT CLAVULANATE 875-125 MG PO TABS: 1.0000 | ORAL_TABLET | Freq: Two times a day (BID) | ORAL | Status: DC

## 2013-01-10 NOTE — ED Notes (Signed)
Pt reports that he was bit on his right leg by a dog that he did not know. States that he is unsure whether the dog was up to date on his shots. Pt with small reddened area to the back of thigh.

## 2013-01-10 NOTE — ED Provider Notes (Signed)
Medical screening examination/treatment/procedure(s) were performed by non-physician practitioner and as supervising physician I was immediately available for consultation/collaboration.   Gwyneth Sprout, MD 01/10/13 2145

## 2013-01-10 NOTE — ED Provider Notes (Signed)
CSN: [id number]     Arrival date & time 01/10/13  1001 History     First MD Initiated Contact with Patient 01/10/13 1038     Chief Complaint  Patient presents with  . Animal Bite   (Consider location/radiation/quality/duration/timing/severity/associated sxs/prior Treatment) The history is provided by the patient.   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 Bite did break the skin but bleeding is well controlled on arrival.  Patient states he is unsure who the dog belongs to or vaccination status.  Animal control has not been contacted at this time.  Last tetanus unknown.  Denies and numbness or paresthesias of RLE.    Past Medical History  Diagnosis Date  . Hypercholesteremia   . Microscopic hematuria     negative work up with Urology in the past.   . Hearing loss   . Tinnitus   . DDD (degenerative disc disease)   . Hypertension   . Colon polyps   . BPH (benign prostatic hypertrophy)     Dr. Earlene Plater / Dr. Etta Grandchild  - Urologist   . Thrombocytopenia   . Vitamin D deficiency   . Hyperlipidemia   . Leukoplakia   . Tobacco abuse    Past Surgical History  Procedure Laterality Date  . Left knee repair    . Hernia repair  1980  . Nose surgery     Family History  Problem Relation Age of Onset  . Heart failure Father    History  Substance Use Topics  . Smoking status: Former Smoker -- 50 years    Types: Cigarettes    Quit date: 08/14/2009  . Smokeless tobacco: Not on file  . Alcohol Use: No    Review of Systems  Skin: Positive for wound.  All other systems reviewed and are negative.    Allergies  Aspirin; Crestor; Hydrocodone; and Penicillins  Home Medications   Current Outpatient Rx  Name  Route  Sig  Dispense  Refill  . Cholecalciferol (VITAMIN D3) 2000 UNITS TABS   Oral   Take 1 tablet by mouth daily.          . [facility] CALCIUM-MAGNESIUM-ZINC PO   Oral   Take 1 tablet by mouth  daily.          Marland Kitchen ezetimibe (ZETIA) 10 MG tablet   Oral   Take 10 mg by mouth daily.          . hydrochlorothiazide (HYDRODIURIL) 12.5 MG tablet   Oral   Take 12.5 mg by mouth daily.         . Multiple Vitamins-Minerals (MULTIVITAMIN WITH MINERALS) tablet   Oral   Take 1 tablet by mouth daily.         . niacin (NIASPAN) 1000 MG CR tablet   Oral   Take 1,000 mg by mouth at bedtime.           . Omega-3 Fatty Acids (FISH OIL) 1000 MG CAPS   Oral   Take 1 capsule by mouth daily.          . ramipril (ALTACE) 10 MG tablet   Oral   Take 10 mg by mouth at bedtime as needed.           . simvastatin (ZOCOR) 40 MG tablet   Oral   Take 40 mg by mouth at bedtime.           . Tamsulosin HCl (  FLOMAX) 0.4 MG CAPS   Oral   Take 0.4 mg by mouth at bedtime.          Marland Kitchen CALCIUM-MAGNESIUM PO   Oral   Take 1 tablet by mouth daily.         Marland Kitchen doxycycline (VIBRA-TABS) 100 MG tablet   Oral   Take 1 tablet (100 mg total) by mouth 2 (two) times daily.   20 tablet   0   . mirabegron ER (MYRBETRIQ) 25 MG TB24   Oral   Take 25 mg by mouth daily.          BP 142/82  Pulse 76  Temp(Src) 97.7 F (36.5 C) (Oral)  Resp 19  SpO2 93%  Physical Exam  Nursing note and vitals reviewed. Constitutional: He is oriented to person, place, and time. He appears well-developed and well-nourished.  HENT:  Head: Normocephalic and atraumatic.  Eyes: Conjunctivae and EOM are normal. Pupils are equal, round, and reactive to light.  Neck: Normal range of motion. Neck supple.  Cardiovascular: Normal rate, regular rhythm and normal heart sounds.   Pulmonary/Chest: Effort normal and breath sounds normal.  Musculoskeletal: Normal range of motion.  Neurological: He is alert and oriented to person, place, and time.  Skin: Skin is warm and dry.  Small dog bite to right posterior calf, 4 teeth marks present, localized erythema and bruising, no swelling, FB or signs of infection   Psychiatric: He has a normal mood and affect.    ED Course   Procedures (including critical care time)  Labs Reviewed - No data to display No results found.  1. Animal bite of lower leg, right, initial encounter   2. Rabies, need for prophylactic vaccination against     MDM   Animal control contacted, they are on scene looking for the dog.  They will call pt and FU.  Tetanus updated.  Rabies vaccine, immune globulin and first dose of augmentin given in the ED.  Rx augmentin.  Given FU schedule for subsequent rabies vaccinations at [facility] urgent care.  Instructed that if he does not complete the series, vaccines may not be fully effective-- pt acknowledged understanding and agreed to plan.  Return precautions advised.  Garlon Hatchet, PA-C 01/10/13 1531

## 2013-01-10 NOTE — Progress Notes (Signed)
Per Triage, patient sent to ER for evaluation and possible rabies vaccinations.

## 2013-01-10 NOTE — Patient Instructions (Signed)
Go to Emergency Department.

## 2013-01-17 ENCOUNTER — Encounter (HOSPITAL_COMMUNITY): Payer: Self-pay | Admitting: Emergency Medicine

## 2013-01-17 ENCOUNTER — Emergency Department (HOSPITAL_COMMUNITY)
Admission: EM | Admit: 2013-01-17 | Discharge: 2013-01-17 | Disposition: A | Discharge status: Home / Self Care | Payer: Medicare Other | Source: Home / Self Care

## 2013-01-17 DIAGNOSIS — Z203 Contact with and (suspected) exposure to rabies: Secondary | ICD-10-CM

## 2013-01-17 MED ORDER — RABIES VACCINE, PCEC IM SUSR
INTRAMUSCULAR | Status: AC
  Filled 2013-01-17: qty 1

## 2013-01-17 MED ORDER — RABIES VACCINE, PCEC IM SUSR
1.0000 mL | Freq: Once | INTRAMUSCULAR | Status: AC
  Administered 2013-01-17: 1 mL via INTRAMUSCULAR

## 2013-01-17 NOTE — ED Notes (Signed)
Patient at [facility] today for rabies injection

## 2013-01-24 ENCOUNTER — Encounter (HOSPITAL_COMMUNITY): Payer: Self-pay | Admitting: Emergency Medicine

## 2013-01-24 ENCOUNTER — Emergency Department (INDEPENDENT_AMBULATORY_CARE_PROVIDER_SITE_OTHER)
Admission: EM | Admit: 2013-01-24 | Discharge: 2013-01-24 | Disposition: A | Discharge status: Home / Self Care | Payer: Medicare Other | Source: Home / Self Care

## 2013-01-24 DIAGNOSIS — Z203 Contact with and (suspected) exposure to rabies: Secondary | ICD-10-CM

## 2013-01-24 MED ORDER — RABIES VACCINE, PCEC IM SUSR
1.0000 mL | Freq: Once | INTRAMUSCULAR | Status: AC
  Administered 2013-01-24: 1 mL via INTRAMUSCULAR

## 2013-01-24 MED ORDER — RABIES VACCINE, PCEC IM SUSR
INTRAMUSCULAR | Status: AC
  Filled 2013-01-24: qty 1

## 2013-01-24 NOTE — ED Notes (Signed)
Patient is here for Day 7 rabies vaccination.

## 2013-01-31 ENCOUNTER — Emergency Department (INDEPENDENT_AMBULATORY_CARE_PROVIDER_SITE_OTHER)
Admission: EM | Admit: 2013-01-31 | Discharge: 2013-01-31 | Disposition: A | Discharge status: Home / Self Care | Payer: Medicare Other | Source: Home / Self Care

## 2013-01-31 ENCOUNTER — Encounter (HOSPITAL_COMMUNITY): Payer: Self-pay

## 2013-01-31 DIAGNOSIS — Z203 Contact with and (suspected) exposure to rabies: Secondary | ICD-10-CM

## 2013-01-31 MED ORDER — RABIES VACCINE, PCEC IM SUSR
1.0000 mL | Freq: Once | INTRAMUSCULAR | Status: AC
  Administered 2013-01-31: 1 mL via INTRAMUSCULAR

## 2013-01-31 MED ORDER — RABIES VACCINE, PCEC IM SUSR
INTRAMUSCULAR | Status: AC
  Filled 2013-01-31: qty 1

## 2013-01-31 NOTE — ED Notes (Signed)
Her for day #14 , final shot , in rabies series ; NAD

## 2013-03-02 ENCOUNTER — Telehealth: Payer: Self-pay | Admitting: *Deleted

## 2013-03-02 NOTE — Telephone Encounter (Signed)
Pt states returning a missed call.  Informed him of lab appt scheduled for tomorrow at 10:15 am.  He verbalized understanding.

## 2013-03-03 ENCOUNTER — Other Ambulatory Visit (HOSPITAL_BASED_OUTPATIENT_CLINIC_OR_DEPARTMENT_OTHER): Payer: Medicare Other | Admitting: Lab

## 2013-03-03 DIAGNOSIS — D696 Thrombocytopenia, unspecified: Secondary | ICD-10-CM

## 2013-03-03 LAB — CBC WITH DIFFERENTIAL/PLATELET
Basophils Absolute: 0.1 10*3/uL (ref 0.0–0.1)
EOS%: 3.5 % (ref 0.0–7.0)
HCT: 41.4 % (ref 38.4–49.9)
HGB: 14.2 g/dL (ref 13.0–17.1)
LYMPH%: 35.9 % (ref 14.0–49.0)
MCH: 30.4 pg (ref 27.2–33.4)
MCV: 88.7 fL (ref 79.3–98.0)
MONO%: 12.3 % (ref 0.0–14.0)
NEUT%: 47.1 % (ref 39.0–75.0)
Platelets: 134 10*3/uL — ABNORMAL LOW (ref 140–400)

## 2013-03-04 ENCOUNTER — Telehealth: Payer: Self-pay | Admitting: *Deleted

## 2013-03-04 NOTE — Telephone Encounter (Signed)
Attempted to call pt w/ lab results.  Pt does not have Voicemail.  Will wait for return call.

## 2013-03-04 NOTE — Telephone Encounter (Signed)
Message copied by Wende Mott on Fri Mar 04, 2013  1:26 PM ------      Message from: Lonnie Hurley      Created: Thu Mar 03, 2013  8:38 PM       Call pt. Plt remain stable. Continue observation. ------

## 2013-03-15 ENCOUNTER — Ambulatory Visit: Payer: Medicare Other

## 2013-06-02 ENCOUNTER — Other Ambulatory Visit (HOSPITAL_BASED_OUTPATIENT_CLINIC_OR_DEPARTMENT_OTHER): Payer: Medicare Other

## 2013-06-02 ENCOUNTER — Encounter (INDEPENDENT_AMBULATORY_CARE_PROVIDER_SITE_OTHER): Payer: Self-pay

## 2013-06-02 DIAGNOSIS — D696 Thrombocytopenia, unspecified: Secondary | ICD-10-CM

## 2013-06-02 LAB — CBC WITH DIFFERENTIAL/PLATELET
EOS%: 2.1 % (ref 0.0–7.0)
LYMPH%: 36.2 % (ref 14.0–49.0)
MCH: 30.3 pg (ref 27.2–33.4)
MCV: 89.9 fL (ref 79.3–98.0)
MONO%: 13.1 % (ref 0.0–14.0)
RBC: 4.59 10*6/uL (ref 4.20–5.82)
RDW: 13.2 % (ref 11.0–14.6)

## 2013-06-23 ENCOUNTER — Other Ambulatory Visit (INDEPENDENT_AMBULATORY_CARE_PROVIDER_SITE_OTHER): Payer: Medicare Other

## 2013-06-23 DIAGNOSIS — Z0000 Encounter for general adult medical examination without abnormal findings: Secondary | ICD-10-CM

## 2013-06-23 DIAGNOSIS — I10 Essential (primary) hypertension: Secondary | ICD-10-CM

## 2013-06-23 DIAGNOSIS — D696 Thrombocytopenia, unspecified: Secondary | ICD-10-CM

## 2013-06-23 DIAGNOSIS — E785 Hyperlipidemia, unspecified: Secondary | ICD-10-CM

## 2013-06-23 LAB — POCT CBC
Granulocyte percent: 54.5 %G (ref 37–80)
HCT, POC: 44.5 % (ref 43.5–53.7)
Hemoglobin: 14.2 g/dL (ref 14.1–18.1)
Lymph, poc: 2.9 (ref 0.6–3.4)
MCH, POC: 28.5 pg (ref 27–31.2)
MCHC: 31.8 g/dL (ref 31.8–35.4)
MCV: 89.6 fL (ref 80–97)
MPV: 8.7 fL (ref 0–99.8)
POC Granulocyte: 3.9 (ref 2–6.9)
POC LYMPH PERCENT: 40.8 %L (ref 10–50)
Platelet Count, POC: 127 10*3/uL — AB (ref 142–424)
RBC: 5 M/uL (ref 4.69–6.13)
RDW, POC: 13.2 %
WBC: 7.2 10*3/uL (ref 4.6–10.2)

## 2013-06-23 NOTE — Progress Notes (Signed)
Pt came in for labs only 

## 2013-06-25 LAB — NMR, LIPOPROFILE
Cholesterol: 106 mg/dL (ref <200)
HDL Cholesterol by NMR: 41 mg/dL (ref >=40)
HDL Particle Number: 31.7 umol/L (ref >=30.5)
LDL Particle Number: 1066 nmol/L — ABNORMAL HIGH (ref <1000)
LDL Size: 19.9 nm — ABNORMAL LOW (ref >20.5)
LDLC SERPL CALC-MCNC: 48 mg/dL (ref <100)
LP-IR Score: 74 — ABNORMAL HIGH (ref <=45)
Small LDL Particle Number: 754 nmol/L — ABNORMAL HIGH (ref <=527)
Triglycerides by NMR: 87 mg/dL (ref <150)

## 2013-06-25 LAB — CMP14+EGFR
ALT: 17 IU/L (ref 0–44)
AST: 17 IU/L (ref 0–40)
Albumin/Globulin Ratio: 2 (ref 1.1–2.5)
Albumin: 4.1 g/dL (ref 3.5–4.8)
Alkaline Phosphatase: 57 IU/L (ref 39–117)
BUN/Creatinine Ratio: 19 (ref 10–22)
BUN: 19 mg/dL (ref 8–27)
CO2: 25 mmol/L (ref 18–29)
Calcium: 9.4 mg/dL (ref 8.6–10.2)
Chloride: 102 mmol/L (ref 97–108)
Creatinine, Ser: 0.99 mg/dL (ref 0.76–1.27)
GFR calc Af Amer: 89 mL/min/{1.73_m2} (ref >59)
GFR calc non Af Amer: 77 mL/min/{1.73_m2} (ref >59)
Globulin, Total: 2.1 g/dL (ref 1.5–4.5)
Glucose: 97 mg/dL (ref 65–99)
Potassium: 4.5 mmol/L (ref 3.5–5.2)
Sodium: 142 mmol/L (ref 134–144)
Total Bilirubin: 0.6 mg/dL (ref 0.0–1.2)
Total Protein: 6.2 g/dL (ref 6.0–8.5)

## 2013-06-25 LAB — PSA, TOTAL AND FREE
PSA, Free Pct: 41 %
PSA, Free: 0.41 ng/mL
PSA: 1 ng/mL (ref 0.0–4.0)

## 2013-06-25 LAB — THYROID PANEL WITH TSH
Free Thyroxine Index: 1.7 (ref 1.2–4.9)
T3 Uptake Ratio: 31 % (ref 24–39)
T4, Total: 5.6 ug/dL (ref 4.5–12.0)
TSH: 1.54 u[IU]/mL (ref 0.450–4.500)

## 2013-06-25 LAB — VITAMIN D 25 HYDROXY (VIT D DEFICIENCY, FRACTURES): Vit D, 25-Hydroxy: 36 ng/mL (ref 30.0–100.0)

## 2013-06-30 ENCOUNTER — Ambulatory Visit (INDEPENDENT_AMBULATORY_CARE_PROVIDER_SITE_OTHER): Payer: Medicare Other | Admitting: Family Medicine

## 2013-06-30 ENCOUNTER — Encounter: Payer: Self-pay | Admitting: Family Medicine

## 2013-06-30 VITALS — BP 116/74 | HR 62 | Temp 97.6°F | Ht 65.0 in | Wt 234.0 lb

## 2013-06-30 DIAGNOSIS — N400 Enlarged prostate without lower urinary tract symptoms: Secondary | ICD-10-CM

## 2013-06-30 DIAGNOSIS — E785 Hyperlipidemia, unspecified: Secondary | ICD-10-CM

## 2013-06-30 DIAGNOSIS — I10 Essential (primary) hypertension: Secondary | ICD-10-CM

## 2013-06-30 MED ORDER — HYDROCHLOROTHIAZIDE 12.5 MG PO TABS: 12.5000 mg | ORAL_TABLET | Freq: Every day | ORAL | Status: DC

## 2013-06-30 MED ORDER — EZETIMIBE 10 MG PO TABS: 10.0000 mg | ORAL_TABLET | Freq: Every day | ORAL | Status: DC

## 2013-06-30 MED ORDER — RAMIPRIL 10 MG PO CAPS: 10.0000 mg | ORAL_CAPSULE | Freq: Every day | ORAL | Status: DC

## 2013-06-30 MED ORDER — SIMVASTATIN 40 MG PO TABS: 40.0000 mg | ORAL_TABLET | Freq: Every day | ORAL | Status: DC

## 2013-06-30 MED ORDER — NIACIN ER (ANTIHYPERLIPIDEMIC) 1000 MG PO TBCR: 1000.0000 mg | EXTENDED_RELEASE_TABLET | Freq: Every day | ORAL | Status: DC

## 2013-06-30 NOTE — Progress Notes (Signed)
   Subjective:    Patient ID: GEOFF DACANAY, male    DOB: 05/09/1941, 73 y.o.   MRN: [id number]  HPI This 73 y.o. male presents for evaluation of hypertension, hyperlipidemia, and vitamin  D deficiency .   Review of Systems No chest pain, SOB, HA, dizziness, vision change, N/V, diarrhea, constipation, dysuria, urinary urgency or frequency, myalgias, arthralgias or rash.     Objective:   Physical Exam  Vital signs noted  Well developed well nourished male.  HEENT - Head atraumatic Normocephalic                Eyes - PERRLA, Conjuctiva - clear Sclera- Clear EOMI                Ears - EAC's Wnl TM's Wnl Gross Hearing WNL                Nose - Nares patent                 Throat - oropharanx wnl Respiratory - Lungs CTA bilateral Cardiac - RRR S1 and S2 without murmur GI - Abdomen soft Nontender and bowel sounds active x 4 Extremities - No edema. Neuro - Grossly intact.      Assessment & Plan:  BPH (benign prostatic hyperplasia)  Unspecified essential hypertension - Plan: niacin (NIASPAN) 1000 MG CR tablet, ramipril (ALTACE) 10 MG capsule  Hyperlipidemia - Plan: ezetimibe (ZETIA) 10 MG tablet, hydrochlorothiazide (HYDRODIURIL) 12.5 MG tablet, simvastatin (ZOCOR) 40 MG tablet  Lysbeth Penner FNP

## 2013-07-02 ENCOUNTER — Other Ambulatory Visit: Payer: Self-pay | Admitting: General Practice

## 2013-07-02 DIAGNOSIS — I10 Essential (primary) hypertension: Secondary | ICD-10-CM

## 2013-07-02 MED ORDER — NIACIN ER (ANTIHYPERLIPIDEMIC) 1000 MG PO TBCR: 1000.0000 mg | EXTENDED_RELEASE_TABLET | Freq: Every day | ORAL | Status: DC

## 2013-07-14 ENCOUNTER — Telehealth: Payer: Self-pay | Admitting: Family Medicine

## 2013-07-14 ENCOUNTER — Other Ambulatory Visit: Payer: Self-pay | Admitting: *Deleted

## 2013-07-14 DIAGNOSIS — E785 Hyperlipidemia, unspecified: Secondary | ICD-10-CM

## 2013-07-14 DIAGNOSIS — I10 Essential (primary) hypertension: Secondary | ICD-10-CM

## 2013-07-14 MED ORDER — HYDROCHLOROTHIAZIDE 12.5 MG PO TABS: 12.5000 mg | ORAL_TABLET | Freq: Every day | ORAL | Status: DC

## 2013-07-14 MED ORDER — RAMIPRIL 10 MG PO CAPS: 10.0000 mg | ORAL_CAPSULE | Freq: Every day | ORAL | Status: DC

## 2013-07-14 MED ORDER — EZETIMIBE 10 MG PO TABS: 10.0000 mg | ORAL_TABLET | Freq: Every day | ORAL | Status: DC

## 2013-07-14 MED ORDER — SIMVASTATIN 40 MG PO TABS: 40.0000 mg | ORAL_TABLET | Freq: Every day | ORAL | Status: DC

## 2013-07-14 NOTE — Telephone Encounter (Signed)
LDL-P was a little more elevated compared to last check but over all cholesterol looks good. Continue simvastatin 40mg  daily and zetia 10mg  daily.

## 2013-07-14 NOTE — Progress Notes (Signed)
Mail order pharmacy wouldn't accept prescription because they couldn't read the signature. Sent prescriptions electronically.

## 2013-07-15 ENCOUNTER — Telehealth: Payer: Self-pay | Admitting: Family Medicine

## 2013-07-15 NOTE — Telephone Encounter (Signed)
Please call the pharmacy for me and call his lisinopril in

## 2013-07-18 ENCOUNTER — Other Ambulatory Visit: Payer: Self-pay | Admitting: *Deleted

## 2013-07-18 DIAGNOSIS — E785 Hyperlipidemia, unspecified: Secondary | ICD-10-CM

## 2013-07-18 DIAGNOSIS — I10 Essential (primary) hypertension: Secondary | ICD-10-CM

## 2013-07-18 MED ORDER — HYDROCHLOROTHIAZIDE 12.5 MG PO TABS: 12.5000 mg | ORAL_TABLET | Freq: Every day | ORAL | Status: DC

## 2013-07-18 MED ORDER — EZETIMIBE 10 MG PO TABS: 10.0000 mg | ORAL_TABLET | Freq: Every day | ORAL | Status: DC

## 2013-07-18 MED ORDER — SIMVASTATIN 40 MG PO TABS: 40.0000 mg | ORAL_TABLET | Freq: Every day | ORAL | Status: DC

## 2013-07-18 MED ORDER — RAMIPRIL 10 MG PO CAPS: 10.0000 mg | ORAL_CAPSULE | Freq: Every day | ORAL | Status: DC

## 2013-07-18 NOTE — Telephone Encounter (Signed)
Reordered through [facility]

## 2013-09-01 ENCOUNTER — Encounter: Payer: Self-pay | Admitting: Hematology and Oncology

## 2013-09-01 ENCOUNTER — Ambulatory Visit (HOSPITAL_BASED_OUTPATIENT_CLINIC_OR_DEPARTMENT_OTHER): Payer: Medicare Other | Admitting: Hematology and Oncology

## 2013-09-01 ENCOUNTER — Other Ambulatory Visit (HOSPITAL_BASED_OUTPATIENT_CLINIC_OR_DEPARTMENT_OTHER): Payer: Medicare Other

## 2013-09-01 VITALS — BP 144/76 | HR 73 | Temp 97.5°F | Resp 19 | Ht 65.0 in | Wt 234.2 lb

## 2013-09-01 DIAGNOSIS — D696 Thrombocytopenia, unspecified: Secondary | ICD-10-CM

## 2013-09-01 DIAGNOSIS — E669 Obesity, unspecified: Secondary | ICD-10-CM

## 2013-09-01 DIAGNOSIS — E785 Hyperlipidemia, unspecified: Secondary | ICD-10-CM

## 2013-09-01 LAB — COMPREHENSIVE METABOLIC PANEL (CC13)
ALK PHOS: 57 U/L (ref 40–150)
ALT: 20 U/L (ref 0–55)
AST: 20 U/L (ref 5–34)
Albumin: 3.6 g/dL (ref 3.5–5.0)
Anion Gap: 9 mEq/L (ref 3–11)
BILIRUBIN TOTAL: 0.84 mg/dL (ref 0.20–1.20)
BUN: 15.8 mg/dL (ref 7.0–26.0)
CO2: 29 mEq/L (ref 22–29)
Calcium: 9.8 mg/dL (ref 8.4–10.4)
Chloride: 105 mEq/L (ref 98–109)
Creatinine: 1.1 mg/dL (ref 0.7–1.3)
GLUCOSE: 109 mg/dL (ref 70–140)
Potassium: 4.5 mEq/L (ref 3.5–5.1)
SODIUM: 143 meq/L (ref 136–145)
TOTAL PROTEIN: 6.9 g/dL (ref 6.4–8.3)

## 2013-09-01 LAB — CBC WITH DIFFERENTIAL/PLATELET
BASO%: 0.6 % (ref 0.0–2.0)
Basophils Absolute: 0.1 10*3/uL (ref 0.0–0.1)
EOS ABS: 0.2 10*3/uL (ref 0.0–0.5)
EOS%: 2.3 % (ref 0.0–7.0)
HCT: 43.9 % (ref 38.4–49.9)
HGB: 14.6 g/dL (ref 13.0–17.1)
LYMPH%: 35.9 % (ref 14.0–49.0)
MCH: 29.7 pg (ref 27.2–33.4)
MCHC: 33.3 g/dL (ref 32.0–36.0)
MCV: 89.4 fL (ref 79.3–98.0)
MONO#: 1.1 10*3/uL — ABNORMAL HIGH (ref 0.1–0.9)
MONO%: 13.6 % (ref 0.0–14.0)
NEUT#: 3.7 10*3/uL (ref 1.5–6.5)
NEUT%: 47.6 % (ref 39.0–75.0)
PLATELETS: 144 10*3/uL (ref 140–400)
RBC: 4.91 10*6/uL (ref 4.20–5.82)
RDW: 12.7 % (ref 11.0–14.6)
WBC: 7.9 10*3/uL (ref 4.0–10.3)
lymph#: 2.8 10*3/uL (ref 0.9–3.3)

## 2013-09-01 LAB — CHCC SMEAR

## 2013-09-01 NOTE — Progress Notes (Signed)
[place] OFFICE PROGRESS NOTE  Redge Gainer, MD DIAGNOSIS:  Chronic thrombocytopenia, resolved, likely due to hepatic congestion  SUMMARY OF HEMATOLOGIC HISTORY: This patient was seen before for chronic thrombocytopenia. His last 2 blood work show resolution of his thrombocytopenia  INTERVAL HISTORY: Lonnie Hurley 73 y.o. male returns for further followup. The patient is attempting to lose weight. Overall he feels well. Denies any bleeding complication. The patient denies any recent signs or symptoms of bleeding such as spontaneous epistaxis, hematuria or hematochezia.  I have reviewed the past medical history, past surgical history, social history and family history with the patient and they are unchanged from previous note.  ALLERGIES:  is allergic to aspirin; crestor; hydrocodone; and penicillins.  MEDICATIONS:  Current Outpatient Prescriptions  Medication Sig Dispense Refill  . CALCIUM-MAGNESIUM PO Take 1 tablet by mouth daily.      . Cholecalciferol (VITAMIN D3) 2000 UNITS TABS Take 1 tablet by mouth daily.       Marland Kitchen ezetimibe (ZETIA) 10 MG tablet Take 1 tablet (10 mg total) by mouth daily.  90 tablet  3  . hydrochlorothiazide (HYDRODIURIL) 12.5 MG tablet Take 1 tablet (12.5 mg total) by mouth daily.  90 tablet  4  . Multiple Vitamins-Minerals (MULTIVITAMIN WITH MINERALS) tablet Take 1 tablet by mouth daily.      . niacin (NIASPAN) 1000 MG CR tablet Take 1 tablet (1,000 mg total) by mouth at bedtime.  90 tablet  4  . Omega-3 Fatty Acids (FISH OIL) 1000 MG CAPS Take 1 capsule by mouth daily.       . ramipril (ALTACE) 10 MG capsule Take 1 capsule (10 mg total) by mouth daily.  90 capsule  4  . simvastatin (ZOCOR) 40 MG tablet Take 1 tablet (40 mg total) by mouth at bedtime.  90 tablet  4  . Tamsulosin HCl (FLOMAX) 0.4 MG CAPS Take 0.4 mg by mouth at bedtime.        No current facility-administered medications for this visit.     REVIEW OF SYSTEMS:    Constitutional: Denies fevers, chills or night sweats Eyes: Denies blurriness of vision Ears, nose, mouth, throat, and face: Denies mucositis or sore throat Respiratory: Denies cough, dyspnea or wheezes Cardiovascular: Denies palpitation, chest discomfort or lower extremity swelling Gastrointestinal:  Denies nausea, heartburn or change in bowel habits Skin: Denies abnormal skin rashes Lymphatics: Denies new lymphadenopathy or easy bruising Neurological:Denies numbness, tingling or new weaknesses Behavioral/Psych: Mood is stable, no new changes  All other systems were reviewed with the patient and are negative.  PHYSICAL EXAMINATION: ECOG PERFORMANCE STATUS: 0 - Asymptomatic  Filed Vitals:   09/01/13 1024  BP: 144/76  Pulse: 73  Temp: 97.5 F (36.4 C)  Resp: 19   Filed Weights   09/01/13 1024  Weight: 234 lb 3.2 oz (106.232 kg)    GENERAL:alert, no distress and comfortable ABDOMEN:abdomen soft, non-tender and normal bowel sounds. Significant abdomen the obesity is seen Musculoskeletal:no cyanosis of digits and no clubbing  NEURO: alert & oriented x 3 with fluent speech, no focal motor/sensory deficits  LABORATORY DATA:  I have reviewed the data as listed Results for orders placed in visit on 09/01/13 (from the past 48 hour(s))  CBC WITH DIFFERENTIAL     Status: Abnormal   Collection Time    09/01/13 10:03 AM      Result Value Ref Range   WBC 7.9  4.0 - 10.3 10e3/uL   NEUT# 3.7  1.5 - 6.5  10e3/uL   HGB 14.6  13.0 - 17.1 g/dL   HCT 43.9  38.4 - 49.9 %   Platelets 144  140 - 400 10e3/uL   MCV 89.4  79.3 - 98.0 fL   MCH 29.7  27.2 - 33.4 pg   MCHC 33.3  32.0 - 36.0 g/dL   RBC 4.91  4.20 - 5.82 10e6/uL   RDW 12.7  11.0 - 14.6 %   lymph# 2.8  0.9 - 3.3 10e3/uL   MONO# 1.1 (*) 0.1 - 0.9 10e3/uL   Eosinophils Absolute 0.2  0.0 - 0.5 10e3/uL   Basophils Absolute 0.1  0.0 - 0.1 10e3/uL   NEUT% 47.6  39.0 - 75.0 %   LYMPH% 35.9  14.0 - 49.0 %   MONO% 13.6  0.0 - 14.0 %    EOS% 2.3  0.0 - 7.0 %   BASO% 0.6  0.0 - 2.0 %  COMPREHENSIVE METABOLIC PANEL (QI29)     Status: None   Collection Time    09/01/13 10:04 AM      Result Value Ref Range   Sodium 143  136 - 145 mEq/L   Potassium 4.5  3.5 - 5.1 mEq/L   Chloride 105  98 - 109 mEq/L   CO2 29  22 - 29 mEq/L   Glucose 109  70 - 140 mg/dl   BUN 15.8  7.0 - 26.0 mg/dL   Creatinine 1.1  0.7 - 1.3 mg/dL   Total Bilirubin 0.84  0.20 - 1.20 mg/dL   Alkaline Phosphatase 57  40 - 150 U/L   AST 20  5 - 34 U/L   ALT 20  0 - 55 U/L   Total Protein 6.9  6.4 - 8.3 g/dL   Albumin 3.6  3.5 - 5.0 g/dL   Calcium 9.8  8.4 - 10.4 mg/dL   Anion Gap 9  3 - 11 mEq/L    Lab Results  Component Value Date   WBC 7.9 09/01/2013   HGB 14.6 09/01/2013   HCT 43.9 09/01/2013   MCV 89.4 09/01/2013   PLT 144 09/01/2013   ASSESSMENT & PLAN:  #1 chronic thrombocytopenia, resolved #2 history of hyperlipidemia I suspect the cause of his thrombocytopenia is likely due to hepatic congestion from significant obesity and hyperlipidemia. The patient is attempting to loose weight and exercise regularly. His low platelet problem has resolved. I would discharge patient from the clinic. I continue to encourage his effort to lose weight. All questions were answered. The patient knows to call the clinic with any problems, questions or concerns. No barriers to learning was detected.  I spent 15 minutes counseling the patient face to face. The total time spent in the appointment was 20 minutes and more than 50% was on counseling.     [place], [place], MD 09/01/2013 11:58 AM

## 2013-12-29 ENCOUNTER — Ambulatory Visit (INDEPENDENT_AMBULATORY_CARE_PROVIDER_SITE_OTHER): Payer: Medicare Other

## 2013-12-29 ENCOUNTER — Ambulatory Visit (INDEPENDENT_AMBULATORY_CARE_PROVIDER_SITE_OTHER): Payer: Medicare Other | Admitting: Family Medicine

## 2013-12-29 VITALS — BP 126/83 | HR 79 | Temp 96.9°F | Ht 65.0 in | Wt 229.4 lb

## 2013-12-29 DIAGNOSIS — I739 Peripheral vascular disease, unspecified: Secondary | ICD-10-CM

## 2013-12-29 DIAGNOSIS — M545 Low back pain, unspecified: Secondary | ICD-10-CM

## 2013-12-29 DIAGNOSIS — M5430 Sciatica, unspecified side: Secondary | ICD-10-CM

## 2013-12-29 DIAGNOSIS — Z23 Encounter for immunization: Secondary | ICD-10-CM

## 2013-12-29 DIAGNOSIS — Z1211 Encounter for screening for malignant neoplasm of colon: Secondary | ICD-10-CM

## 2013-12-29 MED ORDER — METHYLPREDNISOLONE (PAK) 4 MG PO TABS: ORAL_TABLET | ORAL | Status: DC

## 2013-12-29 NOTE — Progress Notes (Signed)
   Subjective:    Patient ID: Lonnie Hurley, male    DOB: [birth year], 73 y.o.   MRN: [id number]  HPI This 73 y.o. male presents for evaluation of complaints of discomfort in his buttocks when he sits and when he gets up his legs are numb.  He gets numbness in his right foot with riding the bike at the [facility].  He has been having claudication sx's when he walks a distance.  He has hx of DDD of the lumbar spine.  He has hx of HTN, hyperlipidemia, BPH, and he is due for colonoscopy.   Review of Systems C/o lower extremity numbness and discomfort. No chest pain, SOB, HA, dizziness, vision change, N/V, diarrhea, constipation, dysuria, urinary urgency or frequency, myalgias, arthralgias or rash.     Objective:   Physical Exam   Vital signs noted  Well developed well nourished male.  HEENT - Head atraumatic Normocephalic                Eyes - PERRLA, Conjuctiva - clear Sclera- Clear EOMI                Ears - EAC's Wnl TM's Wnl Gross Hearing WNL                Throat - oropharanx wnl Respiratory - Lungs CTA bilateral Cardiac - RRR S1 and S2 without murmur GI - Abdomen soft Nontender and bowel sounds active x 4 Extremities - No edema. Neuro - Grossly intact. Feet - unable to detect DP or PT pulses bilateral    Assessment & Plan:  Claudication - Plan: Lower Extremity Arterial Doppler  Back pain at L4-L5 level - Plan: DG Lumbar Spine 2-3 Views  Special screening for malignant neoplasms, colon - Plan: Ambulatory referral to Gastroenterology  Sciatica, unspecified laterality - Plan: methylPREDNIsolone (MEDROL DOSPACK) 4 MG tablet, DISCONTINUED: methylPREDNIsolone (MEDROL DOSPACK) 4 MG tablet  Follow up in 2 weeks to discuss.  Lysbeth Penner FNP

## 2014-01-03 ENCOUNTER — Other Ambulatory Visit: Payer: Self-pay

## 2014-01-03 DIAGNOSIS — I70219 Atherosclerosis of native arteries of extremities with intermittent claudication, unspecified extremity: Secondary | ICD-10-CM

## 2014-01-09 ENCOUNTER — Ambulatory Visit (HOSPITAL_COMMUNITY)
Admission: RE | Admit: 2014-01-09 | Discharge: 2014-01-09 | Disposition: A | Discharge status: Home / Self Care | Payer: Medicare Other | Source: Ambulatory Visit | Attending: Family Medicine | Admitting: Family Medicine

## 2014-01-09 DIAGNOSIS — I10 Essential (primary) hypertension: Secondary | ICD-10-CM | POA: Diagnosis not present

## 2014-01-09 DIAGNOSIS — E785 Hyperlipidemia, unspecified: Secondary | ICD-10-CM | POA: Diagnosis not present

## 2014-01-09 DIAGNOSIS — I70219 Atherosclerosis of native arteries of extremities with intermittent claudication, unspecified extremity: Secondary | ICD-10-CM | POA: Diagnosis present

## 2014-01-09 DIAGNOSIS — Z87891 Personal history of nicotine dependence: Secondary | ICD-10-CM | POA: Insufficient documentation

## 2014-01-12 ENCOUNTER — Ambulatory Visit (INDEPENDENT_AMBULATORY_CARE_PROVIDER_SITE_OTHER): Payer: Medicare Other | Admitting: Family Medicine

## 2014-01-12 ENCOUNTER — Encounter: Payer: Self-pay | Admitting: Family Medicine

## 2014-01-12 VITALS — BP 106/69 | HR 71 | Temp 97.4°F | Ht 65.0 in | Wt 225.6 lb

## 2014-01-12 DIAGNOSIS — M129 Arthropathy, unspecified: Secondary | ICD-10-CM

## 2014-01-12 DIAGNOSIS — M1990 Unspecified osteoarthritis, unspecified site: Secondary | ICD-10-CM

## 2014-01-12 MED ORDER — MELOXICAM 15 MG PO TABS: 15.0000 mg | ORAL_TABLET | Freq: Every day | ORAL | Status: DC

## 2014-01-12 NOTE — Progress Notes (Signed)
   Subjective:    Patient ID: Lonnie Hurley, male    DOB: April 24, 1941, 73 y.o.   MRN: [id number]  HPI C/o back pain and right sciatic discomfort and he is better since taking the prednisone. He has a lot of pain problems.  He has been having a lot arthritis discomfort.   Review of Systems C/o arthritis and back pain No chest pain, SOB, HA, dizziness, vision change, N/V, diarrhea, constipation, dysuria, urinary urgency or frequency, myalgias, arthralgias or rash.     Objective:   Physical Exam  Vital signs noted  Well developed well nourished male.  HEENT - Head atraumatic Normocephalic                Eyes - PERRLA, Conjuctiva - clear Sclera- Clear EOMI                Ears - EAC's Wnl TM's Wnl Gross Hearing WNL                Throat - oropharanx wnl Respiratory - Lungs CTA bilateral Cardiac - RRR S1 and S2 without murmur GI - Abdomen soft Nontender and bowel sounds active x 4 MS - TTP LS paraspinous muscles      Assessment & Plan:  Arthritis Meloxicam 15mg  one po qd and he is better and recommend he is allergic to ASA and will try and see If meloxicam can be tolerated.  Lysbeth Penner FNP

## 2014-01-17 ENCOUNTER — Encounter: Payer: Self-pay | Admitting: Internal Medicine

## 2014-02-14 ENCOUNTER — Telehealth: Payer: Self-pay | Admitting: *Deleted

## 2014-02-14 ENCOUNTER — Ambulatory Visit (AMBULATORY_SURGERY_CENTER): Payer: Self-pay | Admitting: *Deleted

## 2014-02-14 VITALS — Ht 66.0 in | Wt 237.0 lb

## 2014-02-14 DIAGNOSIS — Z860100 Personal history of colon polyps, unspecified: Secondary | ICD-10-CM

## 2014-02-14 MED ORDER — MOVIPREP 100 G PO SOLR: 1.0000 | Freq: Once | ORAL | Status: DC

## 2014-02-14 NOTE — Telephone Encounter (Signed)
Pt in [place] today. Had colon 10 years ago with Teena Irani md at [facility]. Release of information form signed and given to [place] for dr Henrene Pastor.

## 2014-02-14 NOTE — Progress Notes (Signed)
No home 02 use. ewm No problems with past sedation. ewm Last colon 02-23-2004 with Teena Irani at [facility].  We need records. Records release to leslie wrenn CMA for dr Henrene Pastor. ewm

## 2014-02-23 ENCOUNTER — Encounter: Payer: Self-pay | Admitting: Internal Medicine

## 2014-02-28 ENCOUNTER — Encounter: Payer: Self-pay | Admitting: Internal Medicine

## 2014-02-28 ENCOUNTER — Ambulatory Visit (AMBULATORY_SURGERY_CENTER): Payer: Medicare Other | Admitting: Internal Medicine

## 2014-02-28 VITALS — BP 91/65 | HR 59 | Temp 96.8°F | Resp 13 | Ht 66.0 in | Wt 237.0 lb

## 2014-02-28 DIAGNOSIS — D122 Benign neoplasm of ascending colon: Secondary | ICD-10-CM

## 2014-02-28 DIAGNOSIS — Z86010 Personal history of colonic polyps: Secondary | ICD-10-CM

## 2014-02-28 DIAGNOSIS — D126 Benign neoplasm of colon, unspecified: Secondary | ICD-10-CM

## 2014-02-28 DIAGNOSIS — Z1211 Encounter for screening for malignant neoplasm of colon: Secondary | ICD-10-CM

## 2014-02-28 HISTORY — PX: COLONOSCOPY: SHX174

## 2014-02-28 MED ORDER — SODIUM CHLORIDE 0.9 % IV SOLN: 500.0000 mL | INTRAVENOUS | Status: DC

## 2014-02-28 NOTE — Patient Instructions (Signed)
YOU HAD AN ENDOSCOPIC PROCEDURE TODAY AT THE [facility]: Refer to the procedure report that was given to you for any specific questions about what was found during the examination.  If the procedure report does not answer your questions, please call your gastroenterologist to clarify.  If you requested that your care partner not be given the details of your procedure findings, then the procedure report has been included in a sealed envelope for you to review at your convenience later.  YOU SHOULD EXPECT: Some feelings of bloating in the abdomen. Passage of more gas than usual.  Walking can help get rid of the air that was put into your GI tract during the procedure and reduce the bloating. If you had a lower endoscopy (such as a colonoscopy or flexible sigmoidoscopy) you may notice spotting of blood in your stool or on the toilet paper. If you underwent a bowel prep for your procedure, then you may not have a normal bowel movement for a few days.  DIET: Your first meal following the procedure should be a light meal and then it is ok to progress to your normal diet.  A half-sandwich or bowl of soup is an example of a good first meal.  Heavy or fried foods are harder to digest and may make you feel nauseous or bloated.  Likewise meals heavy in dairy and vegetables can cause extra gas to form and this can also increase the bloating.  Drink plenty of fluids but you should avoid alcoholic beverages for 24 hours.  ACTIVITY: Your care partner should take you home directly after the procedure.  You should plan to take it easy, moving slowly for the rest of the day.  You can resume normal activity the day after the procedure however you should NOT DRIVE or use heavy machinery for 24 hours (because of the sedation medicines used during the test).    SYMPTOMS TO REPORT IMMEDIATELY: A gastroenterologist can be reached at any hour.  During normal business hours, 8:30 AM to 5:00 PM Monday through Friday,  call [phone].  After hours and on weekends, please call the GI answering service at [phone] who will take a message and have the physician on call contact you.   Following lower endoscopy (colonoscopy or flexible sigmoidoscopy):  Excessive amounts of blood in the stool  Significant tenderness or worsening of abdominal pains  Swelling of the abdomen that is new, acute  Fever of 100F or higher    FOLLOW UP: If any biopsies were taken you will be contacted by phone or by letter within the next 1-3 weeks.  Call your gastroenterologist if you have not heard about the biopsies in 3 weeks.  Our staff will call the home number listed on your records the next business day following your procedure to check on you and address any questions or concerns that you may have at that time regarding the information given to you following your procedure. This is a courtesy call and so if there is no answer at the home number and we have not heard from you through the emergency physician on call, we will assume that you have returned to your regular daily activities without incident.  SIGNATURES/CONFIDENTIALITY: You and/or your care partner have signed paperwork which will be entered into your electronic medical record.  These signatures attest to the fact that that the information above on your After Visit Summary has been reviewed and is understood.  Full responsibility of the confidentiality   this discharge information lies with you and/or your care-partner.  Polyp, diverticulosis and high fiber diet information given. 

## 2014-02-28 NOTE — Op Note (Signed)
[facility]  [address] [place], [address]   COLONOSCOPY PROCEDURE REPORT  PATIENT: Lonnie Hurley, Lonnie Hurley  MR#: [id number] BIRTHDATE: [birth year] , 72  yrs. old GENDER: Male ENDOSCOPIST: Eustace Quail, MD REFERRED EZ:[id number] Laurance Flatten, M.D. PROCEDURE DATE:  02/28/2014 PROCEDURE:   Colonoscopy with snare polypectomy x 4 First Screening Colonoscopy - Avg.  risk and is 50 yrs.  old or older - No.  Prior Negative Screening - Now for repeat screening. 10 or more years since last screening  History of Adenoma - Now for follow-up colonoscopy & has been > or = to 3 yrs.  N/A  Polyps Removed Today? Yes. ASA CLASS:   Class II INDICATIONS:average risk screening. Index exam 2005 (Dr. Amedeo Plenty) non-adenoma/hyperplastic polyp only. MEDICATIONS: MAC sedation, administered by CRNA and propofol (Diprivan) 350mg  IV  DESCRIPTION OF PROCEDURE:   After the risks benefits and alternatives of the procedure were thoroughly explained, informed consent was obtained.  A digital rectal exam revealed no abnormalities of the rectum.   The [id number]  endoscope was introduced through the anus and advanced to the cecum, which was identified by both the appendix and ileocecal valve. No adverse events experienced.   The quality of the prep was excellent, using MoviPrep  The instrument was then slowly withdrawn as the colon was fully examined.  COLON FINDINGS: Four diminutive polyps were found in the ascending colon.  A polypectomy was performed with a cold snare.  The resection was complete and the polyp tissue was completely retrieved.   Moderate diverticulosis was noted The finding was in the left colon.   The colon mucosa was otherwise normal. Retroflexed views revealed internal hemorrhoids. The time to cecum=4 minutes 59 seconds.  Withdrawal time=14 minutes 59 seconds. The scope was withdrawn and the procedure completed. COMPLICATIONS: There were no complications.  ENDOSCOPIC  IMPRESSION: 1.   Four diminutive polyps were found in the ascending colon; polypectomy was performed with a cold snare 2.   Moderate diverticulosis was noted in the left colon 3.   The colon mucosa was otherwise normal  RECOMMENDATIONS: 1. Repeat colonoscopy in 5 years if polyp adenomatous; otherwise 10 years   eSigned:  Eustace Quail, MD 02/28/2014 1:59 PM   cc: Redge Gainer, MD and The Patient

## 2014-02-28 NOTE — Progress Notes (Signed)
Called to room to assist during endoscopic procedure.  Patient ID and intended procedure confirmed with present staff. Received instructions for my participation in the procedure from the performing physician.  

## 2014-02-28 NOTE — Progress Notes (Signed)
Report to PACU, RN, vss, BBS= Clear.  

## 2014-03-01 ENCOUNTER — Telehealth: Payer: Self-pay | Admitting: *Deleted

## 2014-03-01 NOTE — Telephone Encounter (Signed)
  Follow up Call-  Call back number 02/28/2014  Post procedure Call Back phone  # [phone]  Permission to leave phone message No  comments NO VOICEMAIL     Patient questions:  Do you have a fever, pain , or abdominal swelling? No. Pain Score  0 *  Have you tolerated food without any problems? Yes.    Have you been able to return to your normal activities? Yes.    Do you have any questions about your discharge instructions: Diet   No. Medications  No. Follow up visit  No.  Do you have questions or concerns about your Care? No.  Actions: * If pain score is 4 or above: No action needed, pain <4.

## 2014-03-01 NOTE — Telephone Encounter (Signed)
Had colon 9-15 ewm, rn

## 2014-03-07 ENCOUNTER — Encounter: Payer: Self-pay | Admitting: Internal Medicine

## 2014-03-14 ENCOUNTER — Ambulatory Visit (INDEPENDENT_AMBULATORY_CARE_PROVIDER_SITE_OTHER): Payer: Medicare Other

## 2014-03-14 DIAGNOSIS — Z23 Encounter for immunization: Secondary | ICD-10-CM

## 2014-05-22 ENCOUNTER — Encounter: Payer: Self-pay | Admitting: Family Medicine

## 2014-05-22 ENCOUNTER — Ambulatory Visit (INDEPENDENT_AMBULATORY_CARE_PROVIDER_SITE_OTHER): Payer: Medicare Other | Admitting: Family Medicine

## 2014-05-22 VITALS — BP 151/93 | HR 78 | Temp 98.0°F | Ht 66.0 in | Wt 236.0 lb

## 2014-05-22 DIAGNOSIS — R200 Anesthesia of skin: Secondary | ICD-10-CM

## 2014-05-22 DIAGNOSIS — I10 Essential (primary) hypertension: Secondary | ICD-10-CM

## 2014-05-22 DIAGNOSIS — M5136 Other intervertebral disc degeneration, lumbar region: Secondary | ICD-10-CM

## 2014-05-22 DIAGNOSIS — R739 Hyperglycemia, unspecified: Secondary | ICD-10-CM

## 2014-05-22 LAB — POCT GLYCOSYLATED HEMOGLOBIN (HGB A1C): Hemoglobin A1C: 5.9

## 2014-05-22 NOTE — Progress Notes (Signed)
   Subjective:    Patient ID: Lonnie Hurley, male    DOB: January 27, 1941, 73 y.o.   MRN: [id number]  HPI Patient is here for c/o left tingling and numbness.  He states he took his fsbs and it was 127.   He has had arterial doppler that was normal.  He has no hx of diabetes.  He has been having PTNS treatments for his bladder and he had trouble at last tx with having the conduction and states he didn't feel it as good.  He has DDD of the LS spine and hx of T12 compression fx.  He has been told he has borderline diabetes.  Review of Systems  Constitutional: Negative for fever.  HENT: Negative for ear pain.   Eyes: Negative for discharge.  Respiratory: Negative for cough.   Cardiovascular: Negative for chest pain.  Gastrointestinal: Negative for abdominal distention.  Endocrine: Negative for polyuria.  Genitourinary: Negative for difficulty urinating.  Musculoskeletal: Negative for gait problem and neck pain.  Skin: Negative for color change and rash.  Neurological: Negative for speech difficulty and headaches.  Psychiatric/Behavioral: Negative for agitation.       Objective:    BP 151/93 mmHg  Pulse 78  Temp(Src) 98 F (36.7 C)  Ht 5' 6" (1.676 m)  Wt 236 lb (107.049 kg)  BMI 38.11 kg/m2 Physical Exam  Constitutional: He is oriented to person, place, and time. He appears well-developed and well-nourished.  HENT:  Head: Normocephalic and atraumatic.  Mouth/Throat: Oropharynx is clear and moist.  Eyes: Pupils are equal, round, and reactive to light.  Neck: Normal range of motion. Neck supple.  Cardiovascular: Normal rate and regular rhythm.   No murmur heard. 1 plus DP and PT right foot  Pulmonary/Chest: Effort normal and breath sounds normal.  Abdominal: Soft. Bowel sounds are normal. There is no tenderness.  Musculoskeletal:  Reduced sensation bottom right foot with monofilament.  Neurological: He is alert and oriented to person, place, and time.  Skin: Skin is warm and  dry.  Psychiatric: He has a normal mood and affect.          Assessment & Plan:     ICD-9-CM ICD-10-CM   1. Numbness 782.0 R20.0 Vitamin B12  2. Hyperglycemia 790.29 R73.9 POCT glycosylated hemoglobin (Hb A1C)  3. Essential hypertension 401.9 I10 POCT CBC     CMP14+EGFR  4. DDD (degenerative disc disease), lumbar 722.52 M51.36 POCT CBC     TSH     CMP14+EGFR     No Follow-up on file.  Lysbeth Penner FNP

## 2014-05-23 LAB — VITAMIN B12: Vitamin B-12: 195 pg/mL — ABNORMAL LOW (ref 211–946)

## 2014-05-23 LAB — CMP14+EGFR
ALT: 17 IU/L (ref 0–44)
AST: 19 IU/L (ref 0–40)
Albumin/Globulin Ratio: 1.7 (ref 1.1–2.5)
Albumin: 4.1 g/dL (ref 3.5–4.8)
Alkaline Phosphatase: 54 IU/L (ref 39–117)
BUN/Creatinine Ratio: 17 (ref 10–22)
BUN: 16 mg/dL (ref 8–27)
CO2: 23 mmol/L (ref 18–29)
Calcium: 9.4 mg/dL (ref 8.6–10.2)
Chloride: 103 mmol/L (ref 97–108)
Creatinine, Ser: 0.96 mg/dL (ref 0.76–1.27)
GFR calc Af Amer: 92 mL/min/{1.73_m2} (ref >59)
GFR calc non Af Amer: 79 mL/min/{1.73_m2} (ref >59)
Globulin, Total: 2.4 g/dL (ref 1.5–4.5)
Glucose: 120 mg/dL — ABNORMAL HIGH (ref 65–99)
Potassium: 4 mmol/L (ref 3.5–5.2)
Sodium: 142 mmol/L (ref 134–144)
Total Bilirubin: 0.7 mg/dL (ref 0.0–1.2)
Total Protein: 6.5 g/dL (ref 6.0–8.5)

## 2014-05-23 LAB — CBC WITH DIFFERENTIAL
Basophils Absolute: 0.1 10*3/uL (ref 0.0–0.2)
Basos: 1 %
Eos: 1 %
Eosinophils Absolute: 0.1 10*3/uL (ref 0.0–0.4)
HCT: 45.8 % (ref 37.5–51.0)
Hemoglobin: 15.8 g/dL (ref 12.6–17.7)
Immature Grans (Abs): 0 10*3/uL (ref 0.0–0.1)
Immature Granulocytes: 0 %
Lymphocytes Absolute: 2.4 10*3/uL (ref 0.7–3.1)
Lymphs: 31 %
MCH: 31 pg (ref 26.6–33.0)
MCHC: 34.5 g/dL (ref 31.5–35.7)
MCV: 90 fL (ref 79–97)
Monocytes Absolute: 0.8 10*3/uL (ref 0.1–0.9)
Monocytes: 10 %
Neutrophils Absolute: 4.4 10*3/uL (ref 1.4–7.0)
Neutrophils Relative %: 57 %
Platelets: 155 10*3/uL (ref 150–379)
RBC: 5.1 x10E6/uL (ref 4.14–5.80)
RDW: 13.2 % (ref 12.3–15.4)
WBC: 7.7 10*3/uL (ref 3.4–10.8)

## 2014-05-23 LAB — TSH: TSH: 2.3 u[IU]/mL (ref 0.450–4.500)

## 2014-05-24 ENCOUNTER — Telehealth: Payer: Self-pay | Admitting: Family Medicine

## 2014-05-24 NOTE — Telephone Encounter (Signed)
I hadn't called patient but returned his call anyway. Was unable to reach him.  He had labs 2 days ago - maybe was called by nurse regarding call.  Will forward message to nurses pool.

## 2014-05-24 NOTE — Telephone Encounter (Signed)
Thank  You for taking a look at this.  I could not find a phone message either.  Thanks/rs

## 2014-05-24 NOTE — Telephone Encounter (Signed)
Patient prefers to start B-12 at the beginning of January.

## 2014-05-29 ENCOUNTER — Encounter: Payer: Self-pay | Admitting: Family Medicine

## 2014-05-29 ENCOUNTER — Ambulatory Visit (INDEPENDENT_AMBULATORY_CARE_PROVIDER_SITE_OTHER): Payer: Medicare Other | Admitting: Family Medicine

## 2014-05-29 VITALS — BP 119/77 | HR 77 | Temp 97.1°F | Ht 66.0 in | Wt 236.6 lb

## 2014-05-29 DIAGNOSIS — I10 Essential (primary) hypertension: Secondary | ICD-10-CM

## 2014-05-29 DIAGNOSIS — R5383 Other fatigue: Secondary | ICD-10-CM

## 2014-05-29 DIAGNOSIS — E785 Hyperlipidemia, unspecified: Secondary | ICD-10-CM

## 2014-05-29 DIAGNOSIS — E538 Deficiency of other specified B group vitamins: Secondary | ICD-10-CM

## 2014-05-29 MED ORDER — CYANOCOBALAMIN 1000 MCG/ML IJ SOLN: INTRAMUSCULAR | Status: DC

## 2014-05-29 MED ORDER — NIACIN ER (ANTIHYPERLIPIDEMIC) 1000 MG PO TBCR: 1000.0000 mg | EXTENDED_RELEASE_TABLET | Freq: Every day | ORAL | Status: DC

## 2014-05-29 MED ORDER — HYDROCHLOROTHIAZIDE 12.5 MG PO TABS
12.5000 mg | ORAL_TABLET | Freq: Every day | ORAL | Status: DC
Start: 2014-05-29 — End: 2014-06-29

## 2014-05-29 MED ORDER — CYANOCOBALAMIN 1000 MCG/ML IJ SOLN
1000.0000 ug | INTRAMUSCULAR | Status: DC
  Administered 2014-05-29 – 2014-10-05 (×5): 1000 ug via INTRAMUSCULAR

## 2014-05-29 NOTE — Progress Notes (Signed)
   Subjective:    Patient ID: Lonnie Hurley, male    DOB: [birth year], 73 y.o.   MRN: [id number]  HPI Patient is here for follow up on his labs.  He has some numbness and b12 level is low.  He needs refills.  He is doing well and is exercising daily.  Review of Systems  Constitutional: Negative for fever.  HENT: Negative for ear pain.   Eyes: Negative for discharge.  Respiratory: Negative for cough.   Cardiovascular: Negative for chest pain.  Gastrointestinal: Negative for abdominal distention.  Endocrine: Negative for polyuria.  Genitourinary: Negative for difficulty urinating.  Musculoskeletal: Negative for gait problem and neck pain.  Skin: Negative for color change and rash.  Neurological: Negative for speech difficulty and headaches.  Psychiatric/Behavioral: Negative for agitation.       Objective:    BP 119/77 mmHg  Pulse 77  Temp(Src) 97.1 F (36.2 C) (Oral)  Ht 5\' 6"  (1.676 m)  Wt 236 lb 9.6 oz (107.321 kg)  BMI 38.21 kg/m2 Physical Exam  Constitutional: He is oriented to person, place, and time. He appears well-developed and well-nourished.  HENT:  Head: Normocephalic and atraumatic.  Mouth/Throat: Oropharynx is clear and moist.  Eyes: Pupils are equal, round, and reactive to light.  Neck: Normal range of motion. Neck supple.  Cardiovascular: Normal rate and regular rhythm.   No murmur heard. Pulmonary/Chest: Effort normal and breath sounds normal.  Abdominal: Soft. Bowel sounds are normal. There is no tenderness.  Neurological: He is alert and oriented to person, place, and time.  Skin: Skin is warm and dry.  Psychiatric: He has a normal mood and affect.          Assessment & Plan:     ICD-9-CM ICD-10-CM   1. Other fatigue 780.79 R53.83 cyanocobalamin ((VITAMIN B-12)) injection 1,000 mcg     cyanocobalamin (,VITAMIN B-12,) 1000 MCG/ML injection     DISCONTINUED: cyanocobalamin (,VITAMIN B-12,) 1000 MCG/ML injection  2. Vitamin B12 deficiency 266.2  E53.8 cyanocobalamin ((VITAMIN B-12)) injection 1,000 mcg     cyanocobalamin (,VITAMIN B-12,) 1000 MCG/ML injection     DISCONTINUED: cyanocobalamin (,VITAMIN B-12,) 1000 MCG/ML injection  3. Hyperlipidemia 272.4 E78.5 niacin (NIASPAN) 1000 MG CR tablet     hydrochlorothiazide (HYDRODIURIL) 12.5 MG tablet  4. Essential hypertension 401.9 I10      No Follow-up on file.  Lysbeth Penner FNP

## 2014-05-30 ENCOUNTER — Ambulatory Visit (INDEPENDENT_AMBULATORY_CARE_PROVIDER_SITE_OTHER): Payer: Medicare Other | Admitting: *Deleted

## 2014-05-30 ENCOUNTER — Other Ambulatory Visit: Payer: Self-pay | Admitting: *Deleted

## 2014-05-30 DIAGNOSIS — E785 Hyperlipidemia, unspecified: Secondary | ICD-10-CM

## 2014-05-30 DIAGNOSIS — E538 Deficiency of other specified B group vitamins: Secondary | ICD-10-CM

## 2014-05-30 MED ORDER — NIACIN ER (ANTIHYPERLIPIDEMIC) 1000 MG PO TBCR: 1000.0000 mg | EXTENDED_RELEASE_TABLET | Freq: Every day | ORAL | Status: DC

## 2014-05-30 MED ORDER — CYANOCOBALAMIN 1000 MCG/ML IJ SOLN
1000.0000 ug | Freq: Every day | INTRAMUSCULAR | Status: AC
  Administered 2014-05-30 – 2014-06-02 (×4): 1000 ug via INTRAMUSCULAR

## 2014-05-30 NOTE — Patient Instructions (Signed)

## 2014-05-30 NOTE — Progress Notes (Signed)
Vitamin b12 injection given and tolerated well.  

## 2014-05-31 ENCOUNTER — Ambulatory Visit (INDEPENDENT_AMBULATORY_CARE_PROVIDER_SITE_OTHER): Payer: Medicare Other | Admitting: *Deleted

## 2014-05-31 DIAGNOSIS — E538 Deficiency of other specified B group vitamins: Secondary | ICD-10-CM

## 2014-05-31 NOTE — Progress Notes (Signed)
Vitamin b12 injection given and tolerated well.  

## 2014-05-31 NOTE — Patient Instructions (Signed)
Vitamin B12 Deficiency Not having enough vitamin B12 is called a deficiency. Vitamin B12 is an important vitamin. Your body needs vitamin B12 to:   Make red blood cells.  Make DNA. This is the genetic material inside all of your cells.  Help your nerves work properly so they can carry messages from your brain to your body. CAUSES  Not eating enough foods that contain vitamin B12.  Not having enough stomach acid and digestive juices. The body needs these to absorb vitamin B12 from the food you eat.  Having certain digestive system diseases that make it hard to absorb vitamin B12. These diseases include Crohn's disease, chronic pancreatitis, and cystic fibrosis.  Having pernicious anemia, which is a condition where the body has too few red blood cells. People with this condition do not make enough of a protein called "intrinsic factor," which is needed to absorb vitamin B12.  Having a surgery in which part of the stomach or small intestine is removed.  Taking certain medicines that make it hard for the body to absorb vitamin B12. These medicines include:  Heartburn medicine (antacids and proton pump inhibitors).  A certain antibiotic medicine called neomycin, which fights infection.  Some medicines used to treat diabetes, tuberculosis, gout, and high cholesterol. RISK FACTORS Risk factors are things that make you more likely to develop a vitamin B12 deficiency. They include:  Being older than 50.  Being a vegetarian.  Being pregnant and a vegetarian or having a poor diet.  Taking certain drugs.  Being an alcoholic. SYMPTOMS You may have a vitamin B12 deficiency with no symptoms. However, a vitamin B12 deficiency can cause health problems like anemia and nerve damage. These health problems can lead to many possible symptoms, including:  Weakness.  Fatigue.  Loss of appetite.  Weight loss.  Numbness or tingling in your hands and feet.  Redness and burning of the  tongue.  Confusion or memory problems.  Depression.  Dizziness.  Sensory problems, such as loss of taste, color blindness, and ringing in the ears.  Diarrhea or constipation.  Trouble walking. DIAGNOSIS Various types of tests can be given to help find the cause of your vitamin B12 deficiency. These tests include:  A complete blood count (CBC). This test gives your caregiver an overall picture of what makes up your blood.  A blood test to measure your B12 level.  A blood test to measure intrinsic factor.  An endoscopy. This procedure uses a thin tube with a camera on the end to look into your stomach or intestines. TREATMENT Treatment for vitamin B12 deficiency depends on what is causing it. Common options include:  Changing your eating and drinking habits, such as:  Eating more foods that contain vitamin B12.  Not drinking as much alcohol or any alcohol.  Taking vitamin B12 supplements. Your caregiver will tell you what dose is best for you.  Getting vitamin B12 injections. Some people get these a few times a week. Others get them once a month. HOME CARE INSTRUCTIONS  Take all supplements as directed by your caregiver. Follow the directions carefully.  Get any injections your caregiver prescribes. Do not miss your appointments.  Eat lots of healthy foods that contain vitamin B12. Ask your caregiver if you should work with a nutritionist. Good things to include in your diet are:  Meat.  Poultry.  Fish.  Eggs.  Fortified cereal and dairy products. This means vitamin B12 has been added to the food. Check the label on the   package to be sure.  Do not abuse alcohol.  Keep all follow-up appointments. Your caregiver will need to perform blood tests to make sure your vitamin B12 deficiency is going away. SEEK MEDICAL CARE IF:  You have any questions about your treatment.  Your symptoms come back. MAKE SURE YOU:  Understand these instructions.  Will watch your  condition.  Will get help right away if you are not doing well or get worse. Document Released: 08/25/2011 Document Reviewed: 08/25/2011 [facility] Patient Information 2015 [place]. This information is not intended to replace advice given to you by your health care provider. Make sure you discuss any questions you have with your health care provider. Cyanocobalamin, Vitamin B12 injection What is this medicine? CYANOCOBALAMIN (sye an oh koe BAL a min) is a man made form of vitamin B12. Vitamin B12 is used in the growth of healthy blood cells, nerve cells, and proteins in the body. It also helps with the metabolism of fats and carbohydrates. This medicine is used to treat people who can not absorb vitamin B12. This medicine may be used for other purposes; ask your health care provider or pharmacist if you have questions. COMMON BRAND NAME(S): Cyomin, LA-12, Nutri-Twelve, Primabalt What should I tell my health care provider before I take this medicine? They need to know if you have any of these conditions: -kidney disease -Leber's disease -megaloblastic anemia -an unusual or allergic reaction to cyanocobalamin, cobalt, other medicines, foods, dyes, or preservatives -pregnant or trying to get pregnant -breast-feeding How should I use this medicine? This medicine is injected into a muscle or deeply under the skin. It is usually given by a health care professional in a clinic or doctor's office. However, your doctor may teach you how to inject yourself. Follow all instructions. Talk to your pediatrician regarding the use of this medicine in children. Special care may be needed. Overdosage: If you think you have taken too much of this medicine contact a poison control center or emergency room at once. NOTE: This medicine is only for you. Do not share this medicine with others. What if I miss a dose? If you are given your dose at a clinic or doctor's office, call to reschedule your appointment.  If you give your own injections and you miss a dose, take it as soon as you can. If it is almost time for your next dose, take only that dose. Do not take double or extra doses. What may interact with this medicine? -colchicine -heavy alcohol intake This list may not describe all possible interactions. Give your health care provider a list of all the medicines, herbs, non-prescription drugs, or dietary supplements you use. Also tell them if you smoke, drink alcohol, or use illegal drugs. Some items may interact with your medicine. What should I watch for while using this medicine? Visit your doctor or health care professional regularly. You may need blood work done while you are taking this medicine. You may need to follow a special diet. Talk to your doctor. Limit your alcohol intake and avoid smoking to get the best benefit. What side effects may I notice from receiving this medicine? Side effects that you should report to your doctor or health care professional as soon as possible: -allergic reactions like skin rash, itching or hives, swelling of the face, lips, or tongue -blue tint to skin -chest tightness, pain -difficulty breathing, wheezing -dizziness -red, swollen painful area on the leg Side effects that usually do not require medical attention (report  to your doctor or health care professional if they continue or are bothersome): -diarrhea -headache This list may not describe all possible side effects. Call your doctor for medical advice about side effects. You may report side effects to FDA at 1-800-FDA-1088. Where should I keep my medicine? Keep out of the reach of children. Store at room temperature between 15 and 30 degrees C (59 and 85 degrees F). Protect from light. Throw away any unused medicine after the expiration date. NOTE: This sheet is a summary. It may not cover all possible information. If you have questions about this medicine, talk to your doctor, pharmacist, or  health care provider.  2015, [employer] Standard. (2007-09-13 22:10:20)

## 2014-06-01 ENCOUNTER — Ambulatory Visit (INDEPENDENT_AMBULATORY_CARE_PROVIDER_SITE_OTHER): Payer: Medicare Other | Admitting: *Deleted

## 2014-06-01 DIAGNOSIS — E538 Deficiency of other specified B group vitamins: Secondary | ICD-10-CM

## 2014-06-01 NOTE — Patient Instructions (Signed)

## 2014-06-01 NOTE — Progress Notes (Signed)
Pt given B12 injection IM left deltoid, pt tolerated injection well. 

## 2014-06-02 ENCOUNTER — Ambulatory Visit (INDEPENDENT_AMBULATORY_CARE_PROVIDER_SITE_OTHER): Payer: Medicare Other | Admitting: *Deleted

## 2014-06-02 DIAGNOSIS — E538 Deficiency of other specified B group vitamins: Secondary | ICD-10-CM

## 2014-06-02 NOTE — Patient Instructions (Signed)

## 2014-06-02 NOTE — Progress Notes (Signed)
Vitamin B12 injection given and tolerated well   

## 2014-06-08 ENCOUNTER — Ambulatory Visit (INDEPENDENT_AMBULATORY_CARE_PROVIDER_SITE_OTHER): Payer: Medicare Other | Admitting: *Deleted

## 2014-06-08 DIAGNOSIS — E538 Deficiency of other specified B group vitamins: Secondary | ICD-10-CM

## 2014-06-08 NOTE — Patient Instructions (Signed)

## 2014-06-08 NOTE — Progress Notes (Signed)
Pt given B12 injection IM left deltoid. Pt tolerated injection well.

## 2014-06-15 ENCOUNTER — Ambulatory Visit (INDEPENDENT_AMBULATORY_CARE_PROVIDER_SITE_OTHER): Payer: Medicare Other | Admitting: *Deleted

## 2014-06-15 DIAGNOSIS — E538 Deficiency of other specified B group vitamins: Secondary | ICD-10-CM

## 2014-06-15 MED ORDER — CYANOCOBALAMIN 1000 MCG/ML IJ SOLN
1000.0000 ug | INTRAMUSCULAR | Status: AC
  Administered 2014-06-15 – 2014-06-23 (×2): 1000 ug via INTRAMUSCULAR

## 2014-06-15 NOTE — Patient Instructions (Signed)

## 2014-06-15 NOTE — Progress Notes (Signed)
Vitamin B12 injection given and tolerated well   

## 2014-06-21 ENCOUNTER — Ambulatory Visit (INDEPENDENT_AMBULATORY_CARE_PROVIDER_SITE_OTHER): Payer: Medicare Other | Admitting: Family Medicine

## 2014-06-21 ENCOUNTER — Encounter: Payer: Self-pay | Admitting: Family Medicine

## 2014-06-21 VITALS — BP 162/78 | HR 105 | Temp 99.2°F | Ht 66.0 in | Wt 238.0 lb

## 2014-06-21 DIAGNOSIS — J029 Acute pharyngitis, unspecified: Secondary | ICD-10-CM

## 2014-06-21 MED ORDER — CEFTRIAXONE SODIUM 1 G IJ SOLR
1.0000 g | Freq: Once | INTRAMUSCULAR | Status: AC
  Administered 2014-06-21: 1 g via INTRAMUSCULAR

## 2014-06-21 MED ORDER — AZITHROMYCIN 250 MG PO TABS: ORAL_TABLET | ORAL | Status: DC

## 2014-06-21 NOTE — Progress Notes (Signed)
   Subjective:    Patient ID: Lonnie Hurley, male    DOB: [birth year], 74 y.o.   MRN: [id number]  HPI Patient is c/o fever and sore throat for 2 days.    Review of Systems  Constitutional: Negative for fever.  HENT: Negative for ear pain.   Eyes: Negative for discharge.  Respiratory: Negative for cough.   Cardiovascular: Negative for chest pain.  Gastrointestinal: Negative for abdominal distention.  Endocrine: Negative for polyuria.  Genitourinary: Negative for difficulty urinating.  Musculoskeletal: Negative for gait problem and neck pain.  Skin: Negative for color change and rash.  Neurological: Negative for speech difficulty and headaches.  Psychiatric/Behavioral: Negative for agitation.       Objective:    BP 162/78 mmHg  Pulse 105  Temp(Src) 99.2 F (37.3 C) (Oral)  Ht 5\' 6"  (1.676 m)  Wt 238 lb (107.956 kg)  BMI 38.43 kg/m2 Physical Exam  Constitutional: He is oriented to person, place, and time. He appears well-developed and well-nourished.  HENT:  Head: Normocephalic and atraumatic.  Mouth/Throat: Oropharynx is clear and moist.  Eyes: Pupils are equal, round, and reactive to light.  Neck: Normal range of motion. Neck supple.  Cardiovascular: Normal rate and regular rhythm.   No murmur heard. Pulmonary/Chest: Effort normal and breath sounds normal.  Abdominal: Soft. Bowel sounds are normal. There is no tenderness.  Neurological: He is alert and oriented to person, place, and time.  Skin: Skin is warm and dry.  Psychiatric: He has a normal mood and affect.          Assessment & Plan:     ICD-9-CM ICD-10-CM   1. Acute pharyngitis, unspecified pharyngitis type 462 J02.9 azithromycin (ZITHROMAX) 250 MG tablet     cefTRIAXone (ROCEPHIN) injection 1 g     Return if symptoms worsen or fail to improve.  Lysbeth Penner FNP

## 2014-06-23 ENCOUNTER — Ambulatory Visit (INDEPENDENT_AMBULATORY_CARE_PROVIDER_SITE_OTHER): Payer: Medicare Other | Admitting: *Deleted

## 2014-06-23 DIAGNOSIS — E538 Deficiency of other specified B group vitamins: Secondary | ICD-10-CM

## 2014-06-23 NOTE — Patient Instructions (Signed)

## 2014-06-23 NOTE — Progress Notes (Signed)
Pt given B12 injection IM left deltoid, pt tolerated injection well. 

## 2014-06-29 ENCOUNTER — Ambulatory Visit (INDEPENDENT_AMBULATORY_CARE_PROVIDER_SITE_OTHER): Payer: Medicare Other | Admitting: Family

## 2014-06-29 ENCOUNTER — Encounter: Payer: Self-pay | Admitting: Family

## 2014-06-29 VITALS — BP 121/80 | HR 84 | Temp 97.1°F | Ht 66.0 in | Wt 234.4 lb

## 2014-06-29 DIAGNOSIS — D696 Thrombocytopenia, unspecified: Secondary | ICD-10-CM

## 2014-06-29 DIAGNOSIS — I10 Essential (primary) hypertension: Secondary | ICD-10-CM

## 2014-06-29 DIAGNOSIS — N400 Enlarged prostate without lower urinary tract symptoms: Secondary | ICD-10-CM

## 2014-06-29 DIAGNOSIS — E559 Vitamin D deficiency, unspecified: Secondary | ICD-10-CM | POA: Insufficient documentation

## 2014-06-29 DIAGNOSIS — E785 Hyperlipidemia, unspecified: Secondary | ICD-10-CM

## 2014-06-29 DIAGNOSIS — E538 Deficiency of other specified B group vitamins: Secondary | ICD-10-CM

## 2014-06-29 MED ORDER — RAMIPRIL 10 MG PO CAPS: 10.0000 mg | ORAL_CAPSULE | Freq: Every day | ORAL | Status: DC

## 2014-06-29 MED ORDER — SIMVASTATIN 40 MG PO TABS: 40.0000 mg | ORAL_TABLET | Freq: Every day | ORAL | Status: DC

## 2014-06-29 MED ORDER — HYDROCHLOROTHIAZIDE 12.5 MG PO TABS: 12.5000 mg | ORAL_TABLET | Freq: Every day | ORAL | Status: DC

## 2014-06-29 MED ORDER — NIACIN ER (ANTIHYPERLIPIDEMIC) 1000 MG PO TBCR: 1000.0000 mg | EXTENDED_RELEASE_TABLET | Freq: Every day | ORAL | Status: DC

## 2014-06-29 MED ORDER — EZETIMIBE 10 MG PO TABS: 10.0000 mg | ORAL_TABLET | Freq: Every day | ORAL | Status: DC

## 2014-06-29 NOTE — Patient Instructions (Signed)

## 2014-06-29 NOTE — Progress Notes (Signed)
Subjective:    Patient ID: Lonnie Hurley, male    DOB: [birth year], 74 y.o.   MRN: [id number]  Hypertension This is a chronic problem. The current episode started more than 1 year ago. The problem has been resolved since onset. The problem is controlled. Pertinent negatives include no anxiety, chest pain, headaches, palpitations, peripheral edema or shortness of breath. Risk factors for coronary artery disease include dyslipidemia and male gender. Past treatments include ACE inhibitors and diuretics. The current treatment provides moderate improvement. There is no history of kidney disease, CAD/MI, CVA, heart failure or a thyroid problem. There is no history of sleep apnea.  Hyperlipidemia This is a chronic problem. The current episode started more than 1 year ago. The problem is controlled. Recent lipid tests were reviewed and are normal. Exacerbating diseases include obesity. He has no history of diabetes or hypothyroidism. Pertinent negatives include no chest pain, leg pain, myalgias or shortness of breath. Current antihyperlipidemic treatment includes statins and nicotinic acid. The current treatment provides significant improvement of lipids. Risk factors for coronary artery disease include dyslipidemia, hypertension, male sex and obesity.  Benign Prostatic Hypertrophy This is a chronic (Pt sees Dr. Jettie Pagan) problem. The current episode started more than 1 year ago. Irritative symptoms include frequency and nocturia. Pertinent negatives include no hematuria, nausea or vomiting. Past treatments include tamsulosin. The treatment provided moderate relief.      Review of Systems  Constitutional: Negative.   HENT: Negative.   Respiratory: Negative.  Negative for shortness of breath.   Cardiovascular: Negative.  Negative for chest pain and palpitations.  Gastrointestinal: Negative.  Negative for nausea and vomiting.  Endocrine: Negative.   Genitourinary: Positive for frequency and nocturia.  Negative for hematuria.  Musculoskeletal: Negative.  Negative for myalgias.  Neurological: Negative.  Negative for headaches.  Hematological: Negative.   Psychiatric/Behavioral: Negative.   All other systems reviewed and are negative.      Objective:   Physical Exam  Constitutional: He is oriented to person, place, and time. He appears well-developed and well-nourished. No distress.  HENT:  Head: Normocephalic.  Right Ear: External ear normal.  Left Ear: External ear normal.  Nose: Nose normal.  Mouth/Throat: Oropharynx is clear and moist.  Eyes: Pupils are equal, round, and reactive to light. Right eye exhibits no discharge. Left eye exhibits no discharge.  Neck: Normal range of motion. Neck supple. No thyromegaly present.  Cardiovascular: Normal rate, regular rhythm, normal heart sounds and intact distal pulses.   No murmur heard. Pulmonary/Chest: Effort normal and breath sounds normal. No respiratory distress. He has no wheezes.  Abdominal: Soft. Bowel sounds are normal. He exhibits no distension. There is no tenderness.  Musculoskeletal: Normal range of motion. He exhibits no edema or tenderness.  Neurological: He is alert and oriented to person, place, and time. He has normal reflexes. No cranial nerve deficit.  Skin: Skin is warm and dry. No rash noted. No erythema.  Psychiatric: He has a normal mood and affect. His behavior is normal. Judgment and thought content normal.  Vitals reviewed.     BP 121/80 mmHg  Pulse 84  Temp(Src) 97.1 F (36.2 C) (Oral)  Ht 5' 6"  (1.676 m)  Wt 234 lb 6.4 oz (106.323 kg)  BMI 37.85 kg/m2     Assessment & Plan:  1. Essential hypertension - CMP14+EGFR; Future - ramipril (ALTACE) 10 MG capsule; Take 1 capsule (10 mg total) by mouth daily.  Dispense: 90 capsule; Refill: 4  2. Hyperlipidemia - CMP14+EGFR;  Future - Lipid panel; Future - ezetimibe (ZETIA) 10 MG tablet; Take 1 tablet (10 mg total) by mouth daily.  Dispense: 90 tablet;  Refill: 3 - hydrochlorothiazide (HYDRODIURIL) 12.5 MG tablet; Take 1 tablet (12.5 mg total) by mouth daily.  Dispense: 90 tablet; Refill: 4 - niacin (NIASPAN) 1000 MG CR tablet; Take 1 tablet (1,000 mg total) by mouth at bedtime.  Dispense: 90 tablet; Refill: 4 - simvastatin (ZOCOR) 40 MG tablet; Take 1 tablet (40 mg total) by mouth at bedtime.  Dispense: 90 tablet; Refill: 4  3. Vitamin D deficiency - CMP14+EGFR; Future - Vit D  25 hydroxy (rtn osteoporosis monitoring); Future  4. Thrombocytopenia - CMP14+EGFR; Future  5. BPH (benign prostatic hyperplasia) - CMP14+EGFR; Future  6. Vitamin B12 deficiency - Vitamin B12; Future   Continue all meds Labs pending Health Maintenance reviewed Diet and exercise encouraged RTO 6 months  [name], FNP

## 2014-06-30 ENCOUNTER — Other Ambulatory Visit (INDEPENDENT_AMBULATORY_CARE_PROVIDER_SITE_OTHER): Payer: Medicare Other

## 2014-06-30 ENCOUNTER — Ambulatory Visit (INDEPENDENT_AMBULATORY_CARE_PROVIDER_SITE_OTHER): Payer: Medicare Other | Admitting: *Deleted

## 2014-06-30 DIAGNOSIS — E559 Vitamin D deficiency, unspecified: Secondary | ICD-10-CM

## 2014-06-30 DIAGNOSIS — N400 Enlarged prostate without lower urinary tract symptoms: Secondary | ICD-10-CM

## 2014-06-30 DIAGNOSIS — D696 Thrombocytopenia, unspecified: Secondary | ICD-10-CM

## 2014-06-30 DIAGNOSIS — E785 Hyperlipidemia, unspecified: Secondary | ICD-10-CM

## 2014-06-30 DIAGNOSIS — R5383 Other fatigue: Secondary | ICD-10-CM

## 2014-06-30 DIAGNOSIS — E538 Deficiency of other specified B group vitamins: Secondary | ICD-10-CM

## 2014-06-30 DIAGNOSIS — I10 Essential (primary) hypertension: Secondary | ICD-10-CM

## 2014-06-30 NOTE — Patient Instructions (Signed)

## 2014-06-30 NOTE — Progress Notes (Signed)
Pt given B12 injection IM right deltoid per pt request, pt tolerated injection well.

## 2014-06-30 NOTE — Progress Notes (Signed)
Labs only

## 2014-07-01 LAB — CMP14+EGFR
ALT: 22 IU/L (ref 0–44)
AST: 17 IU/L (ref 0–40)
Albumin/Globulin Ratio: 1.6 (ref 1.1–2.5)
Albumin: 3.9 g/dL (ref 3.5–4.8)
Alkaline Phosphatase: 52 IU/L (ref 39–117)
BUN/Creatinine Ratio: 19 (ref 10–22)
BUN: 20 mg/dL (ref 8–27)
CHLORIDE: 101 mmol/L (ref 97–108)
CO2: 26 mmol/L (ref 18–29)
CREATININE: 1.03 mg/dL (ref 0.76–1.27)
Calcium: 9 mg/dL (ref 8.6–10.2)
GFR calc Af Amer: 84 mL/min/{1.73_m2} (ref >59)
GFR calc non Af Amer: 73 mL/min/{1.73_m2} (ref >59)
GLUCOSE: 102 mg/dL — AB (ref 65–99)
Globulin, Total: 2.4 g/dL (ref 1.5–4.5)
Potassium: 4 mmol/L (ref 3.5–5.2)
Sodium: 140 mmol/L (ref 134–144)
TOTAL PROTEIN: 6.3 g/dL (ref 6.0–8.5)
Total Bilirubin: 0.5 mg/dL (ref 0.0–1.2)

## 2014-07-01 LAB — LIPID PANEL
CHOL/HDL RATIO: 2.8 ratio (ref 0.0–5.0)
Cholesterol, Total: 106 mg/dL (ref 100–199)
HDL: 38 mg/dL — ABNORMAL LOW (ref >39)
LDL Calculated: 46 mg/dL (ref 0–99)
Triglycerides: 110 mg/dL (ref 0–149)
VLDL CHOLESTEROL CAL: 22 mg/dL (ref 5–40)

## 2014-07-01 LAB — VITAMIN D 25 HYDROXY (VIT D DEFICIENCY, FRACTURES): Vit D, 25-Hydroxy: 33.1 ng/mL (ref 30.0–100.0)

## 2014-07-01 LAB — VITAMIN B12: VITAMIN B 12: 926 pg/mL (ref 211–946)

## 2014-08-01 ENCOUNTER — Ambulatory Visit (INDEPENDENT_AMBULATORY_CARE_PROVIDER_SITE_OTHER): Payer: Medicare Other | Admitting: *Deleted

## 2014-08-01 DIAGNOSIS — E538 Deficiency of other specified B group vitamins: Secondary | ICD-10-CM

## 2014-08-01 NOTE — Progress Notes (Signed)
Vitamin b12 injection given and tolerated well.  

## 2014-08-01 NOTE — Patient Instructions (Signed)

## 2014-08-31 ENCOUNTER — Ambulatory Visit (INDEPENDENT_AMBULATORY_CARE_PROVIDER_SITE_OTHER): Payer: Medicare Other | Admitting: *Deleted

## 2014-08-31 DIAGNOSIS — E538 Deficiency of other specified B group vitamins: Secondary | ICD-10-CM

## 2014-08-31 DIAGNOSIS — R5383 Other fatigue: Secondary | ICD-10-CM

## 2014-08-31 NOTE — Patient Instructions (Signed)

## 2014-08-31 NOTE — Progress Notes (Signed)
Vitamin b12 given and tolerated well. 

## 2014-10-02 ENCOUNTER — Other Ambulatory Visit: Payer: Self-pay | Admitting: Family

## 2014-10-02 DIAGNOSIS — E785 Hyperlipidemia, unspecified: Secondary | ICD-10-CM

## 2014-10-02 DIAGNOSIS — I10 Essential (primary) hypertension: Secondary | ICD-10-CM

## 2014-10-02 MED ORDER — RAMIPRIL 10 MG PO CAPS: 10.0000 mg | ORAL_CAPSULE | Freq: Every day | ORAL | Status: DC

## 2014-10-02 MED ORDER — NIACIN ER (ANTIHYPERLIPIDEMIC) 1000 MG PO TBCR: 1000.0000 mg | EXTENDED_RELEASE_TABLET | Freq: Every day | ORAL | Status: DC

## 2014-10-02 NOTE — Telephone Encounter (Signed)
done

## 2014-10-05 ENCOUNTER — Ambulatory Visit (INDEPENDENT_AMBULATORY_CARE_PROVIDER_SITE_OTHER): Payer: Medicare Other | Admitting: *Deleted

## 2014-10-05 DIAGNOSIS — R5383 Other fatigue: Secondary | ICD-10-CM | POA: Diagnosis not present

## 2014-10-05 DIAGNOSIS — E538 Deficiency of other specified B group vitamins: Secondary | ICD-10-CM

## 2014-10-05 NOTE — Patient Instructions (Signed)

## 2014-10-05 NOTE — Progress Notes (Signed)
Pt given B12 injection IM left deltoid and tolerated well. °

## 2014-10-17 ENCOUNTER — Ambulatory Visit (INDEPENDENT_AMBULATORY_CARE_PROVIDER_SITE_OTHER): Payer: Medicare Other | Admitting: Family Medicine

## 2014-10-17 ENCOUNTER — Encounter: Payer: Self-pay | Admitting: Family Medicine

## 2014-10-17 VITALS — BP 142/85 | HR 68 | Temp 98.0°F | Ht 66.0 in | Wt 241.6 lb

## 2014-10-17 DIAGNOSIS — G609 Hereditary and idiopathic neuropathy, unspecified: Secondary | ICD-10-CM | POA: Insufficient documentation

## 2014-10-17 MED ORDER — PREDNISONE 10 MG PO TABS: ORAL_TABLET | ORAL | Status: DC

## 2014-10-17 NOTE — Progress Notes (Signed)
   Subjective:    Patient ID: Lonnie Hurley, male    DOB: [birth year], 74 y.o.   MRN: [id number]  HPI 74 year old gentleman who complains of pain and numbness in his right foot and leg. There have been some sharp pains in his hips. He has a history of B12 deficiency neuropathy secondary and receives B12 injections. Last B12 level was in the therapeutic range. His symptoms of been ongoing. I note in his chart he had Doppler and LS-spine x-rays within the last year    Review of Systems  HENT: Negative.   Respiratory: Negative.   Cardiovascular: Negative.   Gastrointestinal: Negative.   Musculoskeletal: Negative.   Neurological: Positive for numbness.   Patient Active Problem List   Diagnosis Date Noted  . Vitamin D deficiency 06/29/2014  . BPH (benign prostatic hyperplasia) 06/29/2014  . Hypertension 11/17/2012  . Hyperlipidemia 11/17/2012  . DDD (degenerative disc disease) 11/17/2012  . Thrombocytopenia    Outpatient Encounter Prescriptions as of 10/17/2014  . Order #: [id number] Class: Historical Med  . Order #: [id number] Class: Historical Med  . Order #: [id number] Class: Normal  . Order #: [id number] Class: Normal  . Order #: [id number] Class: Normal  . Order #: [id number] Class: Normal  . Order #: [id number] Class: Historical Med  . Order #: [id number] Class: Normal  . Order #: [id number] Class: Historical Med  . Order #: [id number] Class: Normal  . Order #: [id number] Class: Normal  . Order #: [id number] Class: Historical Med  . Order #: [id number] Class: Normal       Objective:   Physical Exam  Constitutional: He is oriented to person, place, and time. He appears well-developed.  Cardiovascular: Normal rate.   Pulmonary/Chest: Effort normal.  Musculoskeletal:  Sensation in her right foot his okay but there are some calluses that impede sensation. The right calf is firm but there is no erythema or increased 10 to suggest DVT and symptoms are really more in the foot and in the calf.    Neurological: He is alert and oriented to person, place, and time. He has normal reflexes.          Assessment & Plan:  1. Hereditary and idiopathic peripheral neuropathy Problem may be multifactorial. Certainly there is M75 neuropathy but I am concerned that he may   [facility] neuropathy secondary to her disc disease. I will treat him with prednisone for 10-14 days and recheck. If symptoms are not improved consider imaging of lumbar spine to rule out disc problem

## 2014-11-01 ENCOUNTER — Ambulatory Visit: Payer: Medicare Other | Admitting: Family Medicine

## 2014-11-02 ENCOUNTER — Encounter: Payer: Self-pay | Admitting: Family Medicine

## 2014-11-02 ENCOUNTER — Ambulatory Visit (INDEPENDENT_AMBULATORY_CARE_PROVIDER_SITE_OTHER): Payer: Medicare Other | Admitting: Family Medicine

## 2014-11-02 VITALS — BP 109/73 | HR 89 | Temp 97.2°F | Ht 66.0 in | Wt 240.0 lb

## 2014-11-02 DIAGNOSIS — G609 Hereditary and idiopathic neuropathy, unspecified: Secondary | ICD-10-CM

## 2014-11-02 MED ORDER — GABAPENTIN 300 MG PO CAPS: ORAL_CAPSULE | ORAL | Status: DC

## 2014-11-02 NOTE — Progress Notes (Signed)
   Subjective:    Patient ID: Lonnie Hurley, male    DOB: 12-Jul-1940, 74 y.o.   MRN: [id number]  HPI  Patient Active Problem List   Diagnosis Date Noted  . Hereditary and idiopathic peripheral neuropathy 10/17/2014  . Vitamin D deficiency 06/29/2014  . BPH (benign prostatic hyperplasia) 06/29/2014  . Hypertension 11/17/2012  . Hyperlipidemia 11/17/2012  . DDD (degenerative disc disease) 11/17/2012  . Thrombocytopenia    Outpatient Encounter Prescriptions as of 11/02/2014  Medication Sig  . CALCIUM-MAGNESIUM PO Take 1 tablet by mouth daily.  . Cholecalciferol (VITAMIN D3) 2000 UNITS TABS Take 1 tablet by mouth daily.   . cyanocobalamin (,VITAMIN B-12,) 1000 MCG/ML injection One ml IM daily for a week then weekly for 4 weeks then monthly  . ezetimibe (ZETIA) 10 MG tablet Take 1 tablet (10 mg total) by mouth daily.  . hydrochlorothiazide (HYDRODIURIL) 12.5 MG tablet Take 1 tablet (12.5 mg total) by mouth daily.  . meloxicam (MOBIC) 15 MG tablet Take 1 tablet (15 mg total) by mouth daily.  . Multiple Vitamins-Minerals (MULTIVITAMIN WITH MINERALS) tablet Take 1 tablet by mouth daily.  . niacin (NIASPAN) 1000 MG CR tablet Take 1 tablet (1,000 mg total) by mouth at bedtime.  . Omega-3 Fatty Acids (FISH OIL) 1000 MG CAPS Take 1 capsule by mouth daily.   . ramipril (ALTACE) 10 MG capsule Take 1 capsule (10 mg total) by mouth daily.  . simvastatin (ZOCOR) 40 MG tablet Take 1 tablet (40 mg total) by mouth at bedtime.  . Tamsulosin HCl (FLOMAX) 0.4 MG CAPS Take 0.4 mg by mouth at bedtime.   . [DISCONTINUED] predniSONE (DELTASONE) 10 MG tablet 7 tabs x 2 days; 6 tabs x 2 days; 5 tabs x 2 days; 4 tabs x 2 days; 3 tabs x 2 days; 2 tabs x 2 days; 1 tab x 2 days  . [DISCONTINUED] cyanocobalamin ((VITAMIN B-12)) injection 1,000 mcg    No facility-administered encounter medications on file as of 11/02/2014.      Review of Systems     Objective:   Physical Exam  BP 109/73 mmHg  Pulse 89   Temp(Src) 97.2 F (36.2 C) (Oral)  Ht 5\' 6"  (1.676 m)  Wt 240 lb (108.863 kg)  BMI 38.76 kg/m2       Assessment & Plan:

## 2014-11-02 NOTE — Progress Notes (Signed)
   Subjective:    Patient ID: Lonnie Hurley, male    DOB: 1941/03/18, 74 y.o.   MRN: [id number]  HPI since his last visit, after a course of prednisone, symptoms have greatly improved. He still has what sounds like peripheral neuropathy and I note from his medication review, he is not on Neurontin. He asked about further testing such as MRI etc. but I think trial of Neurontin might be the next best thing to try.    Review of Systems  Constitutional: Negative.   Respiratory: Negative.   Cardiovascular: Negative.   Musculoskeletal: Positive for back pain.  Neurological: Positive for numbness.       Objective:   Physical Exam  Constitutional: He is oriented to person, place, and time. He appears well-developed and well-nourished.  Musculoskeletal:  T. here is tenderness over greater trochanters on both sides. Straight leg raising is negative  Neurological: He is alert and oriented to person, place, and time. He has normal reflexes.          Assessment & Plan:  1. Hereditary and idiopathic peripheral neuropathy One-month trial of Neurontin: (300 mg at bedtime and if no improvement after 1 week and 300 mg in a.m.  [facility] MD

## 2014-11-08 ENCOUNTER — Ambulatory Visit (INDEPENDENT_AMBULATORY_CARE_PROVIDER_SITE_OTHER): Payer: Medicare Other | Admitting: *Deleted

## 2014-11-08 DIAGNOSIS — E538 Deficiency of other specified B group vitamins: Secondary | ICD-10-CM

## 2014-11-08 MED ORDER — CYANOCOBALAMIN 1000 MCG/ML IJ SOLN
1000.0000 ug | INTRAMUSCULAR | Status: AC
  Administered 2014-11-08 – 2014-12-13 (×2): 1000 ug via INTRAMUSCULAR

## 2014-11-08 NOTE — Progress Notes (Signed)
Vitamin B12 injection given in Right Deltoid and patient tolerated well.

## 2014-11-08 NOTE — Patient Instructions (Signed)

## 2014-11-20 ENCOUNTER — Encounter: Payer: Self-pay | Admitting: *Deleted

## 2014-12-01 ENCOUNTER — Ambulatory Visit (INDEPENDENT_AMBULATORY_CARE_PROVIDER_SITE_OTHER): Payer: Medicare Other | Admitting: Family Medicine

## 2014-12-01 ENCOUNTER — Encounter: Payer: Self-pay | Admitting: Family Medicine

## 2014-12-01 VITALS — BP 118/67 | HR 77 | Temp 97.2°F | Ht 66.0 in | Wt 242.0 lb

## 2014-12-01 DIAGNOSIS — G609 Hereditary and idiopathic neuropathy, unspecified: Secondary | ICD-10-CM | POA: Diagnosis not present

## 2014-12-01 DIAGNOSIS — I10 Essential (primary) hypertension: Secondary | ICD-10-CM

## 2014-12-01 MED ORDER — NYSTATIN-TRIAMCINOLONE 100000-0.1 UNIT/GM-% EX CREA: 1.0000 "application " | TOPICAL_CREAM | Freq: Two times a day (BID) | CUTANEOUS | Status: DC

## 2014-12-01 NOTE — Progress Notes (Signed)
   Subjective:    Patient ID: Lonnie Hurley, male    DOB: 04/09/1941, 74 y.o.   MRN: [id number]  HPI  74 year old gentleman here to follow-up neuropathy. He has started taking gabapentin 300 mg twice a day. Symptoms have improved. He also has a history of degenerative disc disease with the B12 deficiency. Also question about some itching in the gluteal cleft at the base of spine    Review of Systems  Constitutional: Negative.   Respiratory: Negative.   Cardiovascular: Negative.   Gastrointestinal: Negative.   Skin: Positive for rash.  Neurological: Positive for numbness.  Psychiatric/Behavioral: Positive for confusion.       Patient Active Problem List   Diagnosis Date Noted  . Hereditary and idiopathic peripheral neuropathy 10/17/2014  . Vitamin D deficiency 06/29/2014  . BPH (benign prostatic hyperplasia) 06/29/2014  . Hypertension 11/17/2012  . Hyperlipidemia 11/17/2012  . DDD (degenerative disc disease) 11/17/2012  . Thrombocytopenia    Outpatient Encounter Prescriptions as of 12/01/2014  Medication Sig  . CALCIUM-MAGNESIUM PO Take 1 tablet by mouth daily.  . Cholecalciferol (VITAMIN D3) 2000 UNITS TABS Take 1 tablet by mouth daily.   . cyanocobalamin (,VITAMIN B-12,) 1000 MCG/ML injection One ml IM daily for a week then weekly for 4 weeks then monthly  . ezetimibe (ZETIA) 10 MG tablet Take 1 tablet (10 mg total) by mouth daily.  Marland Kitchen gabapentin (NEURONTIN) 300 MG capsule If sx not improved after 1 week, take 1 cap in AM also  . glucosamine-chondroitin 500-400 MG tablet Take 1 tablet by mouth 3 (three) times daily.  . hydrochlorothiazide (HYDRODIURIL) 12.5 MG tablet Take 1 tablet (12.5 mg total) by mouth daily.  . Multiple Vitamins-Minerals (MULTIVITAMIN WITH MINERALS) tablet Take 1 tablet by mouth daily.  . niacin (NIASPAN) 1000 MG CR tablet Take 1 tablet (1,000 mg total) by mouth at bedtime.  . Omega-3 Fatty Acids (FISH OIL) 1000 MG CAPS Take 1 capsule by mouth daily.   .  ramipril (ALTACE) 10 MG capsule Take 1 capsule (10 mg total) by mouth daily.  . simvastatin (ZOCOR) 40 MG tablet Take 1 tablet (40 mg total) by mouth at bedtime.  . Tamsulosin HCl (FLOMAX) 0.4 MG CAPS Take 0.4 mg by mouth at bedtime.   . [DISCONTINUED] meloxicam (MOBIC) 15 MG tablet Take 1 tablet (15 mg total) by mouth daily.  Marland Kitchen nystatin-triamcinolone (MYCOLOG II) cream Apply 1 application topically 2 (two) times daily.   Facility-Administered Encounter Medications as of 12/01/2014  Medication  . cyanocobalamin ((VITAMIN B-12)) injection 1,000 mcg    Objective:   Physical Exam  Constitutional: He is oriented to person, place, and time. He appears well-developed and well-nourished.  Cardiovascular: Normal rate and regular rhythm.   Musculoskeletal: Normal range of motion.  Neurological: He is alert and oriented to person, place, and time. He displays normal reflexes. He exhibits normal muscle tone.  Skin:  Erythema superior gluteal cleft consistent with monilia  Psychiatric: He has a normal mood and affect.          Assessment & Plan:   1. Hereditary and idiopathic peripheral neuropathy 7 improved on gabapentin may still increased dose but have explained to have no symptoms may not be realistic. We'll continue with B12 is well  2. Essential hypertension Blood pressure well controlled on current regimen of Remeron Prinivil.  Wardell Honour MD

## 2014-12-12 ENCOUNTER — Ambulatory Visit: Payer: Medicare Other

## 2014-12-13 ENCOUNTER — Ambulatory Visit (INDEPENDENT_AMBULATORY_CARE_PROVIDER_SITE_OTHER): Payer: Medicare Other | Admitting: *Deleted

## 2014-12-13 DIAGNOSIS — E538 Deficiency of other specified B group vitamins: Secondary | ICD-10-CM | POA: Diagnosis not present

## 2014-12-13 NOTE — Patient Instructions (Signed)

## 2014-12-13 NOTE — Progress Notes (Signed)
Pt given B12 injection IM left deltoid and tolerated well. °

## 2014-12-14 ENCOUNTER — Encounter: Payer: Self-pay | Admitting: Family

## 2014-12-14 ENCOUNTER — Ambulatory Visit (INDEPENDENT_AMBULATORY_CARE_PROVIDER_SITE_OTHER): Payer: Medicare Other | Admitting: Family

## 2014-12-14 VITALS — BP 120/67 | HR 83 | Temp 97.5°F | Ht 66.0 in | Wt 241.8 lb

## 2014-12-14 DIAGNOSIS — M5136 Other intervertebral disc degeneration, lumbar region: Secondary | ICD-10-CM | POA: Diagnosis not present

## 2014-12-14 DIAGNOSIS — I10 Essential (primary) hypertension: Secondary | ICD-10-CM | POA: Diagnosis not present

## 2014-12-14 DIAGNOSIS — N400 Enlarged prostate without lower urinary tract symptoms: Secondary | ICD-10-CM

## 2014-12-14 DIAGNOSIS — E559 Vitamin D deficiency, unspecified: Secondary | ICD-10-CM | POA: Diagnosis not present

## 2014-12-14 DIAGNOSIS — E538 Deficiency of other specified B group vitamins: Secondary | ICD-10-CM | POA: Diagnosis not present

## 2014-12-14 DIAGNOSIS — E785 Hyperlipidemia, unspecified: Secondary | ICD-10-CM | POA: Diagnosis not present

## 2014-12-14 DIAGNOSIS — G609 Hereditary and idiopathic neuropathy, unspecified: Secondary | ICD-10-CM

## 2014-12-14 NOTE — Patient Instructions (Signed)

## 2014-12-14 NOTE — Progress Notes (Signed)
Subjective:    Patient ID: Lonnie Hurley, male    DOB: [birth year], 74 y.o.   MRN: [id number]  Hypertension This is a chronic problem. The current episode started more than 1 year ago. The problem has been resolved since onset. The problem is controlled. Pertinent negatives include no anxiety, chest pain, headaches, palpitations, peripheral edema or shortness of breath. Risk factors for coronary artery disease include dyslipidemia and male gender. Past treatments include ACE inhibitors and diuretics. The current treatment provides moderate improvement. There is no history of kidney disease, CAD/MI, CVA, heart failure or a thyroid problem. There is no history of sleep apnea.  Hyperlipidemia This is a chronic problem. The current episode started more than 1 year ago. The problem is controlled. Recent lipid tests were reviewed and are normal. Exacerbating diseases include obesity. He has no history of diabetes or hypothyroidism. Pertinent negatives include no chest pain, leg pain, myalgias or shortness of breath. Current antihyperlipidemic treatment includes statins and nicotinic acid. The current treatment provides significant improvement of lipids. Risk factors for coronary artery disease include dyslipidemia, hypertension, male sex and obesity.  Benign Prostatic Hypertrophy This is a chronic (Pt sees Dr. Jettie Pagan) problem. The current episode started more than 1 year ago. Irritative symptoms include frequency and nocturia. Pertinent negatives include no hematuria, nausea or vomiting. Past treatments include tamsulosin. The treatment provided moderate relief.      Review of Systems  Constitutional: Negative.   HENT: Negative.   Respiratory: Negative.  Negative for shortness of breath.   Cardiovascular: Negative.  Negative for chest pain and palpitations.  Gastrointestinal: Negative.  Negative for nausea and vomiting.  Endocrine: Negative.   Genitourinary: Positive for frequency and nocturia.  Negative for hematuria.  Musculoskeletal: Negative.  Negative for myalgias.  Neurological: Negative.  Negative for headaches.  Hematological: Negative.   Psychiatric/Behavioral: Negative.   All other systems reviewed and are negative.      Objective:   Physical Exam  Constitutional: He is oriented to person, place, and time. He appears well-developed and well-nourished. No distress.  HENT:  Head: Normocephalic.  Right Ear: External ear normal.  Left Ear: External ear normal.  Nose: Nose normal.  Mouth/Throat: Oropharynx is clear and moist.  Eyes: Pupils are equal, round, and reactive to light. Right eye exhibits no discharge. Left eye exhibits no discharge.  Neck: Normal range of motion. Neck supple. No thyromegaly present.  Cardiovascular: Normal rate, regular rhythm, normal heart sounds and intact distal pulses.   No murmur heard. Pulmonary/Chest: Effort normal and breath sounds normal. No respiratory distress. He has no wheezes.  Abdominal: Soft. Bowel sounds are normal. He exhibits no distension. There is no tenderness.  Musculoskeletal: Normal range of motion. He exhibits no edema or tenderness.  Neurological: He is alert and oriented to person, place, and time. He has normal reflexes. No cranial nerve deficit.  Skin: Skin is warm and dry. No rash noted. No erythema.  Psychiatric: He has a normal mood and affect. His behavior is normal. Judgment and thought content normal.  Vitals reviewed.   BP 120/67 mmHg  Pulse 83  Temp(Src) 97.5 F (36.4 C)  Ht 5' 6"  (1.676 m)  Wt 241 lb 12.8 oz (109.68 kg)  BMI 39.05 kg/m2       Assessment & Plan:  1. Essential hypertension - CMP14+EGFR  2. Hereditary and idiopathic peripheral neuropathy - CMP14+EGFR  3. DDD (degenerative disc disease), lumbar - CMP14+EGFR  4. BPH (benign prostatic hyperplasia) - CMP14+EGFR  5. Hyperlipidemia -  CMP14+EGFR - Lipid panel  6. Vitamin D deficiency - CMP14+EGFR - Vit D  25 hydroxy  (rtn osteoporosis monitoring)   Continue all meds Labs pending- Will hold off on lipid levels because pt just ate Health Maintenance reviewed Diet and exercise encouraged RTO 6 months  [name], FNP

## 2014-12-14 NOTE — Addendum Note (Signed)
Addended by: [name] A on: 12/14/2014 02:52 PM   Modules accepted: Orders

## 2014-12-15 ENCOUNTER — Ambulatory Visit: Payer: Medicare Other | Admitting: Family

## 2014-12-15 LAB — CMP14+EGFR
A/G RATIO: 1.6 (ref 1.1–2.5)
ALT: 21 IU/L (ref 0–44)
AST: 19 IU/L (ref 0–40)
Albumin: 3.9 g/dL (ref 3.5–4.8)
Alkaline Phosphatase: 51 IU/L (ref 39–117)
BUN / CREAT RATIO: 16 (ref 10–22)
BUN: 16 mg/dL (ref 8–27)
Bilirubin Total: 0.6 mg/dL (ref 0.0–1.2)
CHLORIDE: 103 mmol/L (ref 97–108)
CO2: 28 mmol/L (ref 18–29)
Calcium: 9.8 mg/dL (ref 8.6–10.2)
Creatinine, Ser: 0.97 mg/dL (ref 0.76–1.27)
GFR calc Af Amer: 90 mL/min/{1.73_m2} (ref >59)
GFR calc non Af Amer: 78 mL/min/{1.73_m2} (ref >59)
Globulin, Total: 2.5 g/dL (ref 1.5–4.5)
Glucose: 112 mg/dL — ABNORMAL HIGH (ref 65–99)
Potassium: 3.8 mmol/L (ref 3.5–5.2)
Sodium: 146 mmol/L — ABNORMAL HIGH (ref 134–144)
Total Protein: 6.4 g/dL (ref 6.0–8.5)

## 2014-12-15 LAB — VITAMIN D 25 HYDROXY (VIT D DEFICIENCY, FRACTURES): VIT D 25 HYDROXY: 36.9 ng/mL (ref 30.0–100.0)

## 2014-12-15 LAB — VITAMIN B12: Vitamin B-12: 1205 pg/mL — ABNORMAL HIGH (ref 211–946)

## 2014-12-19 ENCOUNTER — Other Ambulatory Visit: Payer: Self-pay | Admitting: *Deleted

## 2015-01-06 ENCOUNTER — Telehealth: Payer: Self-pay | Admitting: Family Medicine

## 2015-01-06 MED ORDER — GABAPENTIN 300 MG PO CAPS: 300.0000 mg | ORAL_CAPSULE | Freq: Three times a day (TID) | ORAL | Status: DC

## 2015-01-06 NOTE — Telephone Encounter (Signed)
His script for Gabapentin 300 mg ran out before his insurance would pay for his next refill.   He takes BID now and wanted a refill.   The original script was for 45 pills only.  Since prescribing BID would not allow him to get an early refill,  I changed his directions but he knows to continue with same dose of BID.  This was approved per Kayren Eaves FNP.

## 2015-01-16 ENCOUNTER — Ambulatory Visit: Payer: Medicare Other

## 2015-03-16 ENCOUNTER — Ambulatory Visit: Payer: Medicare Other

## 2015-03-16 ENCOUNTER — Ambulatory Visit (INDEPENDENT_AMBULATORY_CARE_PROVIDER_SITE_OTHER): Payer: Medicare Other

## 2015-03-16 DIAGNOSIS — Z23 Encounter for immunization: Secondary | ICD-10-CM

## 2015-03-21 HISTORY — PX: CATARACT EXTRACTION: SUR2

## 2015-04-06 ENCOUNTER — Other Ambulatory Visit: Payer: Self-pay | Admitting: Family Medicine

## 2015-04-06 DIAGNOSIS — I10 Essential (primary) hypertension: Secondary | ICD-10-CM

## 2015-04-06 MED ORDER — RAMIPRIL 10 MG PO CAPS: 10.0000 mg | ORAL_CAPSULE | Freq: Every day | ORAL | Status: DC

## 2015-04-06 NOTE — Telephone Encounter (Signed)
done

## 2015-04-10 ENCOUNTER — Other Ambulatory Visit: Payer: Self-pay | Admitting: Family Medicine

## 2015-04-10 DIAGNOSIS — E785 Hyperlipidemia, unspecified: Secondary | ICD-10-CM

## 2015-04-10 MED ORDER — NIACIN ER (ANTIHYPERLIPIDEMIC) 1000 MG PO TBCR: 1000.0000 mg | EXTENDED_RELEASE_TABLET | Freq: Every day | ORAL | Status: DC

## 2015-04-10 NOTE — Telephone Encounter (Signed)
done

## 2015-04-25 ENCOUNTER — Encounter: Payer: Self-pay | Admitting: Family Medicine

## 2015-04-25 ENCOUNTER — Ambulatory Visit (INDEPENDENT_AMBULATORY_CARE_PROVIDER_SITE_OTHER): Payer: Medicare Other | Admitting: Family Medicine

## 2015-04-25 VITALS — BP 125/65 | HR 77 | Temp 97.0°F | Ht 66.0 in | Wt 240.8 lb

## 2015-04-25 DIAGNOSIS — J218 Acute bronchiolitis due to other specified organisms: Secondary | ICD-10-CM | POA: Diagnosis not present

## 2015-04-25 MED ORDER — AZITHROMYCIN 250 MG PO TABS: ORAL_TABLET | ORAL | Status: DC

## 2015-04-25 NOTE — Progress Notes (Signed)
   HPI  Patient presents today for evaluation of cough and nasal congestion.  Patient explains that for the last week or so he's had continued cough, congestion, malaise, and general decreased energy. He seems to be persisting in this shape with no improvement or worsening over the last few days. He denies ear pain, chest pain, dyspnea, or by mouth intolerance. He denies fever but does endorse decreased energy and malaise.  PMH: Smoking status noted ROS: Per HPI  Objective: BP 125/65 mmHg  Pulse 77  Temp(Src) 97 F (36.1 C) (Oral)  Ht 5\' 6"  (1.676 m)  Wt 240 lb 12.8 oz (109.226 kg)  BMI 38.88 kg/m2 Gen: NAD, alert, cooperative with exam HEENT: NCAT, nares with some swelling bilaterally, TM on the right obscured by cerumen, left normal CV: RRR, good S1/S2, no murmur Resp: CTABL, no wheezes, non-labored Ext: No edema, warm Neuro: Alert and oriented, No gross deficits  Assessment and plan:  # Acute bronchitis Considering force of illness and continued worsening at day 7 we'll go ahead and cover with azithromycin Continue suppressant and decongestant   Meds ordered this encounter  Medications  . azithromycin (ZITHROMAX) 250 MG tablet    Sig: Take 2 tablets on day 1 and 1 tablet daily after that    Dispense:  6 tablet    Refill:  0    Laroy Apple, MD [facility] 04/25/2015, 3:04 PM

## 2015-04-25 NOTE — Patient Instructions (Addendum)
Great to meet you!  Come back if you get worse or dont get better as expected.   Acute Bronchitis Bronchitis is inflammation of the airways that extend from the windpipe into the lungs (bronchi). The inflammation often causes mucus to develop. This leads to a cough, which is the most common symptom of bronchitis.  In acute bronchitis, the condition usually develops suddenly and goes away over time, usually in a couple weeks. Smoking, allergies, and asthma can make bronchitis worse. Repeated episodes of bronchitis may cause further lung problems.  CAUSES Acute bronchitis is most often caused by the same virus that causes a cold. The virus can spread from person to person (contagious) through coughing, sneezing, and touching contaminated objects. SIGNS AND SYMPTOMS   Cough.   Fever.   Coughing up mucus.   Body aches.   Chest congestion.   Chills.   Shortness of breath.   Sore throat.  DIAGNOSIS  Acute bronchitis is usually diagnosed through a physical exam. Your health care provider will also ask you questions about your medical history. Tests, such as chest X-rays, are sometimes done to rule out other conditions.  TREATMENT  Acute bronchitis usually goes away in a couple weeks. Oftentimes, no medical treatment is necessary. Medicines are sometimes given for relief of fever or cough. Antibiotic medicines are usually not needed but may be prescribed in certain situations. In some cases, an inhaler may be recommended to help reduce shortness of breath and control the cough. A cool mist vaporizer may also be used to help thin bronchial secretions and make it easier to clear the chest.  HOME CARE INSTRUCTIONS  Get plenty of rest.   Drink enough fluids to keep your urine clear or pale yellow (unless you have a medical condition that requires fluid restriction). Increasing fluids may help thin your respiratory secretions (sputum) and reduce chest congestion, and it will prevent  dehydration.   Take medicines only as directed by your health care provider.  If you were prescribed an antibiotic medicine, finish it all even if you start to feel better.  Avoid smoking and secondhand smoke. Exposure to cigarette smoke or irritating chemicals will make bronchitis worse. If you are a smoker, consider using nicotine gum or skin patches to help control withdrawal symptoms. Quitting smoking will help your lungs heal faster.   Reduce the chances of another bout of acute bronchitis by washing your hands frequently, avoiding people with cold symptoms, and trying not to touch your hands to your mouth, nose, or eyes.   Keep all follow-up visits as directed by your health care provider.  SEEK MEDICAL CARE IF: Your symptoms do not improve after 1 week of treatment.  SEEK IMMEDIATE MEDICAL CARE IF:  You develop an increased fever or chills.   You have chest pain.   You have severe shortness of breath.  You have bloody sputum.   You develop dehydration.  You faint or repeatedly feel like you are going to pass out.  You develop repeated vomiting.  You develop a severe headache. MAKE SURE YOU:   Understand these instructions.  Will watch your condition.  Will get help right away if you are not doing well or get worse.   This information is not intended to replace advice given to you by your health care provider. Make sure you discuss any questions you have with your health care provider.   Document Released: 07/10/2004 Document Revised: 06/23/2014 Document Reviewed: 11/23/2012 Elsevier Interactive Patient Education [employer].

## 2015-06-15 ENCOUNTER — Ambulatory Visit (INDEPENDENT_AMBULATORY_CARE_PROVIDER_SITE_OTHER): Payer: Medicare Other | Admitting: Family

## 2015-06-15 ENCOUNTER — Encounter: Payer: Self-pay | Admitting: Family

## 2015-06-15 VITALS — BP 124/72 | HR 79 | Temp 96.8°F | Ht 66.0 in | Wt 240.0 lb

## 2015-06-15 DIAGNOSIS — M5136 Other intervertebral disc degeneration, lumbar region: Secondary | ICD-10-CM

## 2015-06-15 DIAGNOSIS — E8881 Metabolic syndrome: Secondary | ICD-10-CM

## 2015-06-15 DIAGNOSIS — E559 Vitamin D deficiency, unspecified: Secondary | ICD-10-CM

## 2015-06-15 DIAGNOSIS — N400 Enlarged prostate without lower urinary tract symptoms: Secondary | ICD-10-CM

## 2015-06-15 DIAGNOSIS — I10 Essential (primary) hypertension: Secondary | ICD-10-CM | POA: Diagnosis not present

## 2015-06-15 DIAGNOSIS — R7309 Other abnormal glucose: Secondary | ICD-10-CM

## 2015-06-15 DIAGNOSIS — M51369 Other intervertebral disc degeneration, lumbar region without mention of lumbar back pain or lower extremity pain: Secondary | ICD-10-CM

## 2015-06-15 DIAGNOSIS — Z1159 Encounter for screening for other viral diseases: Secondary | ICD-10-CM | POA: Diagnosis not present

## 2015-06-15 DIAGNOSIS — E785 Hyperlipidemia, unspecified: Secondary | ICD-10-CM

## 2015-06-15 DIAGNOSIS — E538 Deficiency of other specified B group vitamins: Secondary | ICD-10-CM

## 2015-06-15 DIAGNOSIS — D696 Thrombocytopenia, unspecified: Secondary | ICD-10-CM

## 2015-06-15 DIAGNOSIS — G609 Hereditary and idiopathic neuropathy, unspecified: Secondary | ICD-10-CM

## 2015-06-15 LAB — POCT GLYCOSYLATED HEMOGLOBIN (HGB A1C): Hemoglobin A1C: 5.9

## 2015-06-15 MED ORDER — NIACIN ER (ANTIHYPERLIPIDEMIC) 1000 MG PO TBCR: 1000.0000 mg | EXTENDED_RELEASE_TABLET | Freq: Every day | ORAL | Status: DC

## 2015-06-15 MED ORDER — RAMIPRIL 10 MG PO CAPS: 10.0000 mg | ORAL_CAPSULE | Freq: Every day | ORAL | Status: DC

## 2015-06-15 MED ORDER — SIMVASTATIN 40 MG PO TABS: 40.0000 mg | ORAL_TABLET | Freq: Every day | ORAL | Status: DC

## 2015-06-15 MED ORDER — GABAPENTIN 300 MG PO CAPS: 300.0000 mg | ORAL_CAPSULE | Freq: Three times a day (TID) | ORAL | Status: DC

## 2015-06-15 MED ORDER — EZETIMIBE 10 MG PO TABS: 10.0000 mg | ORAL_TABLET | Freq: Every day | ORAL | Status: DC

## 2015-06-15 MED ORDER — HYDROCHLOROTHIAZIDE 12.5 MG PO TABS: 12.5000 mg | ORAL_TABLET | Freq: Every day | ORAL | Status: DC

## 2015-06-15 NOTE — Patient Instructions (Signed)

## 2015-06-15 NOTE — Progress Notes (Signed)
Subjective:    Patient ID: Lonnie Hurley, male    DOB: 09-Dec-1940, 74 y.o.   MRN: [id number]  Pt presents to the office today for chronic follow up. PT is followed by Dr. Rayann Heman, [facility], every 3-6 months for BPH.  Hypertension This is a chronic problem. The current episode started more than 1 year ago. The problem has been resolved since onset. The problem is controlled. Pertinent negatives include no anxiety, chest pain, headaches, palpitations, peripheral edema or shortness of breath. Risk factors for coronary artery disease include dyslipidemia and male gender. Past treatments include ACE inhibitors and diuretics. The current treatment provides moderate improvement. There is no history of kidney disease, CAD/MI, CVA, heart failure or a thyroid problem. There is no history of sleep apnea.  Hyperlipidemia This is a chronic problem. The current episode started more than 1 year ago. The problem is controlled. Recent lipid tests were reviewed and are normal. Exacerbating diseases include obesity. He has no history of diabetes or hypothyroidism. Pertinent negatives include no chest pain, leg pain, myalgias or shortness of breath. Current antihyperlipidemic treatment includes statins and nicotinic acid. The current treatment provides significant improvement of lipids. Risk factors for coronary artery disease include dyslipidemia, hypertension, male sex and obesity.  Benign Prostatic Hypertrophy This is a chronic (Pt sees Dr. Jettie Pagan) problem. The current episode started more than 1 year ago. Irritative symptoms include frequency and nocturia. Pertinent negatives include no hematuria, nausea or vomiting. Past treatments include tamsulosin. The treatment provided moderate relief.      Review of Systems  Constitutional: Negative.   HENT: Negative.   Respiratory: Negative.  Negative for shortness of breath.   Cardiovascular: Negative.  Negative for chest pain and palpitations.  Gastrointestinal:  Negative.  Negative for nausea and vomiting.  Endocrine: Negative.   Genitourinary: Positive for frequency and nocturia. Negative for hematuria.  Musculoskeletal: Negative.  Negative for myalgias.  Neurological: Negative.  Negative for headaches.  Hematological: Negative.   Psychiatric/Behavioral: Negative.   All other systems reviewed and are negative.      Objective:   Physical Exam  Constitutional: He is oriented to person, place, and time. He appears well-developed and well-nourished. No distress.  HENT:  Head: Normocephalic.  Right Ear: External ear normal.  Left Ear: External ear normal.  Nose: Nose normal.  Mouth/Throat: Oropharynx is clear and moist.  Eyes: Pupils are equal, round, and reactive to light. Right eye exhibits no discharge. Left eye exhibits no discharge.  Neck: Normal range of motion. Neck supple. No thyromegaly present.  Cardiovascular: Normal rate, regular rhythm, normal heart sounds and intact distal pulses.   No murmur heard. Pulmonary/Chest: Effort normal and breath sounds normal. No respiratory distress. He has no wheezes.  Abdominal: Soft. Bowel sounds are normal. He exhibits no distension. There is no tenderness.  Musculoskeletal: Normal range of motion. He exhibits no edema or tenderness.  Neurological: He is alert and oriented to person, place, and time. He has normal reflexes. No cranial nerve deficit.  Skin: Skin is warm and dry. No rash noted. No erythema.  Psychiatric: He has a normal mood and affect. His behavior is normal. Judgment and thought content normal.  Vitals reviewed.     BP 124/72 mmHg  Pulse 79  Temp(Src) 96.8 F (36 C) (Oral)  Ht _0  (1.676 m)  Wt 240 lb (108.863 kg)  BMI 38.76 kg/m2     Assessment & Plan:  1. Hyperlipidemia - ezetimibe (ZETIA) 10 MG tablet; Take 1 tablet (10  mg total) by mouth daily.  Dispense: 90 tablet; Refill: 3 - simvastatin (ZOCOR) 40 MG tablet; Take 1 tablet (40 mg total) by mouth at bedtime.   Dispense: 90 tablet; Refill: 4 - niacin (NIASPAN) 1000 MG CR tablet; Take 1 tablet (1,000 mg total) by mouth at bedtime.  Dispense: 90 tablet; Refill: 3 - CMP14+EGFR - Lipid panel  2. Essential hypertension - hydrochlorothiazide (HYDRODIURIL) 12.5 MG tablet; Take 1 tablet (12.5 mg total) by mouth daily.  Dispense: 90 tablet; Refill: 4 - ramipril (ALTACE) 10 MG capsule; Take 1 capsule (10 mg total) by mouth daily.  Dispense: 90 capsule; Refill: 1 - CMP14+EGFR  3. Vitamin B 12 deficiency - CMP14+EGFR  4. Hereditary and idiopathic peripheral neuropathy - gabapentin (NEURONTIN) 300 MG capsule; Take 1 capsule (300 mg total) by mouth 3 (three) times daily.  Dispense: 90 capsule; Refill: 1 - CMP14+EGFR  5. DDD (degenerative disc disease), lumbar - CMP14+EGFR  6. BPH (benign prostatic hyperplasia) - CMP14+EGFR  7. Thrombocytopenia (HCC) - CMP14+EGFR  8. Vitamin D deficiency - CMP14+EGFR  9. Need for hepatitis C screening test - CMP14+EGFR - Hepatitis C antibody  10. Elevated glucose level - CMP14+EGFR - POCT glycosylated hemoglobin (Hb A1C)  11. Metabolic syndrome   Continue all meds Labs pending Health Maintenance reviewed Diet and exercise encouraged RTO 6 months  [name], FNP

## 2015-06-16 LAB — CMP14+EGFR
ALBUMIN: 3.9 g/dL (ref 3.5–4.8)
ALK PHOS: 51 IU/L (ref 39–117)
ALT: 19 IU/L (ref 0–44)
AST: 21 IU/L (ref 0–40)
Albumin/Globulin Ratio: 1.6 (ref 1.1–2.5)
BUN / CREAT RATIO: 17 (ref 10–22)
BUN: 17 mg/dL (ref 8–27)
Bilirubin Total: 0.5 mg/dL (ref 0.0–1.2)
CO2: 27 mmol/L (ref 18–29)
CREATININE: 1.01 mg/dL (ref 0.76–1.27)
Calcium: 9.3 mg/dL (ref 8.6–10.2)
Chloride: 102 mmol/L (ref 96–106)
GFR, EST AFRICAN AMERICAN: 86 mL/min/{1.73_m2} (ref >59)
GFR, EST NON AFRICAN AMERICAN: 74 mL/min/{1.73_m2} (ref >59)
GLOBULIN, TOTAL: 2.4 g/dL (ref 1.5–4.5)
Glucose: 153 mg/dL — ABNORMAL HIGH (ref 65–99)
Potassium: 3.6 mmol/L (ref 3.5–5.2)
SODIUM: 144 mmol/L (ref 134–144)
TOTAL PROTEIN: 6.3 g/dL (ref 6.0–8.5)

## 2015-06-16 LAB — HEPATITIS C ANTIBODY

## 2015-06-16 LAB — LIPID PANEL
CHOL/HDL RATIO: 3.1 ratio (ref 0.0–5.0)
Cholesterol, Total: 118 mg/dL (ref 100–199)
HDL: 38 mg/dL — ABNORMAL LOW (ref >39)
LDL CALC: 43 mg/dL (ref 0–99)
Triglycerides: 186 mg/dL — ABNORMAL HIGH (ref 0–149)
VLDL Cholesterol Cal: 37 mg/dL (ref 5–40)

## 2015-06-19 DIAGNOSIS — N3941 Urge incontinence: Secondary | ICD-10-CM | POA: Diagnosis not present

## 2015-06-20 ENCOUNTER — Telehealth: Payer: Self-pay | Admitting: *Deleted

## 2015-06-20 NOTE — Telephone Encounter (Signed)
Aware of all lab results. 

## 2015-07-10 DIAGNOSIS — N3941 Urge incontinence: Secondary | ICD-10-CM | POA: Diagnosis not present

## 2015-07-12 DIAGNOSIS — N401 Enlarged prostate with lower urinary tract symptoms: Secondary | ICD-10-CM | POA: Diagnosis not present

## 2015-07-12 DIAGNOSIS — Z0000 Encounter for general adult medical examination without abnormal findings: Secondary | ICD-10-CM | POA: Diagnosis not present

## 2015-07-12 DIAGNOSIS — N3941 Urge incontinence: Secondary | ICD-10-CM | POA: Diagnosis not present

## 2015-07-12 DIAGNOSIS — N138 Other obstructive and reflux uropathy: Secondary | ICD-10-CM | POA: Diagnosis not present

## 2015-07-12 DIAGNOSIS — R351 Nocturia: Secondary | ICD-10-CM | POA: Diagnosis not present

## 2015-07-31 DIAGNOSIS — N3941 Urge incontinence: Secondary | ICD-10-CM | POA: Diagnosis not present

## 2015-08-23 DIAGNOSIS — N3941 Urge incontinence: Secondary | ICD-10-CM | POA: Diagnosis not present

## 2015-09-11 DIAGNOSIS — N3941 Urge incontinence: Secondary | ICD-10-CM | POA: Diagnosis not present

## 2015-09-18 ENCOUNTER — Ambulatory Visit (INDEPENDENT_AMBULATORY_CARE_PROVIDER_SITE_OTHER): Payer: PPO | Admitting: Family Medicine

## 2015-09-18 ENCOUNTER — Encounter: Payer: Self-pay | Admitting: Family Medicine

## 2015-09-18 VITALS — BP 130/76 | HR 66 | Temp 97.2°F | Ht 66.0 in | Wt 237.8 lb

## 2015-09-18 DIAGNOSIS — R208 Other disturbances of skin sensation: Secondary | ICD-10-CM

## 2015-09-18 DIAGNOSIS — R7303 Prediabetes: Secondary | ICD-10-CM | POA: Diagnosis not present

## 2015-09-18 DIAGNOSIS — R200 Anesthesia of skin: Secondary | ICD-10-CM

## 2015-09-18 DIAGNOSIS — E119 Type 2 diabetes mellitus without complications: Secondary | ICD-10-CM | POA: Insufficient documentation

## 2015-09-18 LAB — BAYER DCA HB A1C WAIVED: HB A1C: 6.1 % (ref <7.0)

## 2015-09-18 NOTE — Progress Notes (Signed)
   HPI  Patient presents today here with BL Foot numbness  Pt explains he has subtle mild foot numbness limited to the soles with occasional pins and needles type feeling.  He denies overt pain, this has been going on approx 2+ years  He is getting PTNS treatments and has noticed a steady decrease in the sensation in his heel during th etreatments.   He has no loss of function, his last 2 A1C's have been 5.9  It is not unilateral, he has npo focal weakness  PMH: Smoking status noted ROS: Per HPI  Objective: BP 130/76 mmHg  Pulse 66  Temp(Src) 97.2 F (36.2 C) (Oral)  Ht 5\' 6"  (1.676 m)  Wt 237 lb 12.8 oz (107.865 kg)  BMI 38.40 kg/m2 Gen: NAD, alert, cooperative with exam HEENT: NCAT CV: RRR, good S1/S2, no murmur Resp: CTABL, no wheezes, non-labored Ext: No edema, warm Neuro: Alert and oriented, No gross deficits  Feet: BL no lesions, sensation  Intact moofilmentthroughout, 2+ dorsalis pedis pulses bilterall  Assessment and plan:  # foot numbness Mild, bilateral Exline nonpainful Tolerate gabapentin i discussed with him that it's likely not diabetic neuropathy given hisvery well-controlled A1c. For now watc I don't believe it severe enough for medication Consider Lyrica or Cymbalta The clinic with any worsening symptoms, otherwise in 4 monts for routine f/u   Lonnie Apple, MD [facility] 09/18/2015, 3:36 PM

## 2015-09-18 NOTE — Patient Instructions (Signed)
Great to see you!  Lets see you back in 3-4 months  Let us know if the numbness becomes persistent or painful

## 2015-09-19 NOTE — Progress Notes (Signed)
Patient aware.

## 2015-10-02 DIAGNOSIS — N3941 Urge incontinence: Secondary | ICD-10-CM | POA: Diagnosis not present

## 2015-10-04 ENCOUNTER — Ambulatory Visit (INDEPENDENT_AMBULATORY_CARE_PROVIDER_SITE_OTHER): Payer: PPO | Admitting: Family

## 2015-10-04 ENCOUNTER — Encounter (INDEPENDENT_AMBULATORY_CARE_PROVIDER_SITE_OTHER): Payer: Self-pay

## 2015-10-04 ENCOUNTER — Encounter: Payer: Self-pay | Admitting: Family

## 2015-10-04 VITALS — BP 121/72 | HR 80 | Temp 97.0°F | Wt 240.0 lb

## 2015-10-04 DIAGNOSIS — S40911A Unspecified superficial injury of right shoulder, initial encounter: Secondary | ICD-10-CM

## 2015-10-04 DIAGNOSIS — W57XXXA Bitten or stung by nonvenomous insect and other nonvenomous arthropods, initial encounter: Secondary | ICD-10-CM | POA: Diagnosis not present

## 2015-10-04 MED ORDER — DOXYCYCLINE HYCLATE 100 MG PO TABS: 200.0000 mg | ORAL_TABLET | Freq: Once | ORAL | Status: DC

## 2015-10-04 NOTE — Patient Instructions (Signed)
Tick Bite Information Ticks are insects that attach themselves to the skin and draw blood for food. There are various types of ticks. Common types include wood ticks and deer ticks. Most ticks live in shrubs and grassy areas. Ticks can climb onto your body when you make contact with leaves or grass where the tick is waiting. The most common places on the body for ticks to attach themselves are the scalp, neck, armpits, waist, and groin. Most tick bites are harmless, but sometimes ticks carry germs that cause diseases. These germs can be spread to a person during the tick's feeding process. The chance of a disease spreading through a tick bite depends on:   The type of tick.  Time of year.   How long the tick is attached.   Geographic location.  HOW CAN YOU PREVENT TICK BITES? Take these steps to help prevent tick bites when you are outdoors:  Wear protective clothing. Long sleeves and long pants are best.   Wear white clothes so you can see ticks more easily.  Tuck your pant legs into your socks.   If walking on a trail, stay in the middle of the trail to avoid brushing against bushes.  Avoid walking through areas with long grass.  Put insect repellent on all exposed skin and along boot tops, pant legs, and sleeve cuffs.   Check clothing, hair, and skin repeatedly and before going inside.   Brush off any ticks that are not attached.  Take a shower or bath as soon as possible after being outdoors.  WHAT IS THE PROPER WAY TO REMOVE A TICK? Ticks should be removed as soon as possible to help prevent diseases caused by tick bites. 1. If latex gloves are available, put them on before trying to remove a tick.  2. Using fine-point tweezers, grasp the tick as close to the skin as possible. You may also use curved forceps or a tick removal tool. Grasp the tick as close to its head as possible. Avoid grasping the tick on its body. 3. Pull gently with steady upward pressure until  the tick lets go. Do not twist the tick or jerk it suddenly. This may break off the tick's head or mouth parts. 4. Do not squeeze or crush the tick's body. This could force disease-carrying fluids from the tick into your body.  5. After the tick is removed, wash the bite area and your hands with soap and water or other disinfectant such as alcohol. 6. Apply a small amount of antiseptic cream or ointment to the bite site.  7. Wash and disinfect any instruments that were used.  Do not try to remove a tick by applying a hot match, petroleum jelly, or fingernail polish to the tick. These methods do not work and may increase the chances of disease being spread from the tick bite.  WHEN SHOULD YOU SEEK MEDICAL CARE? Contact your health care provider if you are unable to remove a tick from your skin or if a part of the tick breaks off and is stuck in the skin.  After a tick bite, you need to be aware of signs and symptoms that could be related to diseases spread by ticks. Contact your health care provider if you develop any of the following in the days or weeks after the tick bite:  Unexplained fever.  Rash. A circular rash that appears days or weeks after the tick bite may indicate the possibility of Lyme disease. The rash may resemble   a target with a bull's-eye and may occur at a different part of your body than the tick bite.  Redness and swelling in the area of the tick bite.   Tender, swollen lymph glands.   Diarrhea.   Weight loss.   Cough.   Fatigue.   Muscle, joint, or bone pain.   Abdominal pain.   Headache.   Lethargy or a change in your level of consciousness.  Difficulty walking or moving your legs.   Numbness in the legs.   Paralysis.  Shortness of breath.   Confusion.   Repeated vomiting.    This information is not intended to replace advice given to you by your health care provider. Make sure you discuss any questions you have with your health  care provider.   Document Released: 05/30/2000 Document Revised: 06/23/2014 Document Reviewed: 11/10/2012 Elsevier Interactive Patient Education 2016 [employer]  

## 2015-10-04 NOTE — Progress Notes (Signed)
   Subjective:    Patient ID: Lonnie Hurley, male    DOB: 1941-02-19, 75 y.o.   MRN: [id number]  HPI PT presents to the office today for a tick bite of right shoulder . Pt states he noticed the tick yesterday. Pt does not know how long the tick was attached.  Denies any new onset of SOB, joint pain, fever, or rash.    Review of Systems  All other systems reviewed and are negative.      Objective:   Physical Exam  Constitutional: He is oriented to person, place, and time. He appears well-developed and well-nourished. No distress.  Eyes: Pupils are equal, round, and reactive to light. Right eye exhibits no discharge. Left eye exhibits no discharge.  Neck: Normal range of motion. Neck supple. No thyromegaly present.  Cardiovascular: Normal rate, regular rhythm, normal heart sounds and intact distal pulses.   No murmur heard. Pulmonary/Chest: Effort normal and breath sounds normal. No respiratory distress. He has no wheezes.  Abdominal: Soft. Bowel sounds are normal. He exhibits no distension. There is no tenderness.  Musculoskeletal: Normal range of motion. He exhibits no edema or tenderness.  Neurological: He is alert and oriented to person, place, and time. He has normal reflexes. No cranial nerve deficit.  Skin: Skin is warm and dry. No rash noted. No erythema.  Psychiatric: He has a normal mood and affect. His behavior is normal. Judgment and thought content normal.  Vitals reviewed.   BP 121/72 mmHg  Pulse 80  Temp(Src) 97 F (36.1 C) (Oral)  Wt 240 lb (108.863 kg)       Assessment & Plan:  1. Tick bite -Pt to report any new fever, joint pain, or rash -Wear protective clothing while outside- Long sleeves and long pants -Put insect repellent on all exposed skin and along clothing -Take a shower as soon as possible after being outside - doxycycline (VIBRA-TABS) 100 MG tablet; Take 2 tablets (200 mg total) by mouth once.  Dispense: 2 tablet; Refill: 0  [name],  FNP

## 2015-10-23 DIAGNOSIS — N3941 Urge incontinence: Secondary | ICD-10-CM | POA: Diagnosis not present

## 2015-10-25 ENCOUNTER — Encounter (INDEPENDENT_AMBULATORY_CARE_PROVIDER_SITE_OTHER): Payer: Self-pay

## 2015-10-25 ENCOUNTER — Encounter: Payer: Self-pay | Admitting: Pharmacist

## 2015-10-25 ENCOUNTER — Ambulatory Visit (INDEPENDENT_AMBULATORY_CARE_PROVIDER_SITE_OTHER): Payer: PPO | Admitting: Pharmacist

## 2015-10-25 VITALS — BP 120/60 | HR 70 | Ht 65.5 in | Wt 236.5 lb

## 2015-10-25 DIAGNOSIS — Z1211 Encounter for screening for malignant neoplasm of colon: Secondary | ICD-10-CM

## 2015-10-25 DIAGNOSIS — Z0000 Encounter for general adult medical examination without abnormal findings: Secondary | ICD-10-CM | POA: Diagnosis not present

## 2015-10-25 NOTE — Progress Notes (Signed)
Patient ID: Lonnie Hurley, male   DOB: 1940-10-11, 75 y.o.   MRN: [id number]    Subjective:   Lonnie Hurley is a 75 y.o. male who presents for an Initial Medicare Annual Wellness Visit.  Review of Systems    Review of Systems  Constitutional: Negative.   HENT: Negative.   Eyes: Positive for pain (reports having when he took myrbetriq  but has resovled sinced stopped).  Respiratory: Negative.   Cardiovascular: Negative.   Gastrointestinal: Negative.   Genitourinary: Positive for frequency (nocturia - improved with tamsulosin). Negative for urgency.  Musculoskeletal: Positive for joint pain.       Reports leg tingling after sitting for a long time - has diagnosis of DDD  Skin: Negative.   Neurological: Negative.   Endo/Heme/Allergies: Negative.   Psychiatric/Behavioral: Negative.      Current Medications (verified) Outpatient Encounter Prescriptions as of 10/25/2015  Medication Sig  . CALCIUM-MAGNESIUM PO Take 1 tablet by mouth daily.  . Cholecalciferol (VITAMIN D3) 2000 UNITS TABS Take 1 tablet by mouth daily.   Marland Kitchen ezetimibe (ZETIA) 10 MG tablet Take 1 tablet (10 mg total) by mouth daily.  Marland Kitchen gabapentin (NEURONTIN) 300 MG capsule Take 1 capsule (300 mg total) by mouth 3 (three) times daily.  . hydrochlorothiazide (HYDRODIURIL) 12.5 MG tablet Take 1 tablet (12.5 mg total) by mouth daily.  . Multiple Vitamins-Minerals (MULTIVITAMIN WITH MINERALS) tablet Take 1 tablet by mouth daily.  . niacin (NIASPAN) 1000 MG CR tablet Take 1 tablet (1,000 mg total) by mouth at bedtime.  . Omega-3 Fatty Acids (FISH OIL) 1000 MG CAPS Take 1 capsule by mouth daily. 1200  . ramipril (ALTACE) 10 MG capsule Take 1 capsule (10 mg total) by mouth daily.  . simvastatin (ZOCOR) 40 MG tablet Take 1 tablet (40 mg total) by mouth at bedtime.  . Tamsulosin HCl (FLOMAX) 0.4 MG CAPS Take 0.4 mg by mouth at bedtime.   Marland Kitchen glucosamine-chondroitin 500-400 MG tablet Take 1 tablet by mouth 3 (three) times daily.  Marland Kitchen  nystatin-triamcinolone (MYCOLOG II) cream Apply 1 application topically 2 (two) times daily. (Patient not taking: Reported on 10/25/2015)  . [DISCONTINUED] doxycycline (VIBRA-TABS) 100 MG tablet Take 2 tablets (200 mg total) by mouth once.   Facility-Administered Encounter Medications as of 10/25/2015  Medication  . cyanocobalamin ((VITAMIN B-12)) injection 1,000 mcg    Allergies (verified) Aspirin; Crestor; Hydrocodone; and Penicillins   History: Past Medical History  Diagnosis Date  . Hypercholesteremia   . Microscopic hematuria     negative work up with Urology in the past.   . Hearing loss   . Tinnitus   . DDD (degenerative disc disease)   . Hypertension   . Colon polyps   . BPH (benign prostatic hypertrophy)     Dr. Rosana Hoes / Dr. Joelyn Oms  - Urologist   . Thrombocytopenia ([place])   . Vitamin D deficiency   . Hyperlipidemia   . Leukoplakia   . Tobacco abuse   . Cancer ([place])     lip-mole surgery  . Cataract    Past Surgical History  Procedure Laterality Date  . Left knee repair  11-10-2006  . Hernia repair  1980  . Nose surgery    . Cataract extraction  2007    right eye  . Knee surgery  02-28-2008    replaced inside right knee  . Colonoscopy    . Polypectomy    . Skin biopsy  10-08-2006    left ear  . Cataract extraction Left  03/21/15   Family History  Problem Relation Age of Onset  . Heart failure Father   . Colon cancer Neg Hx   . Glaucoma Mother   . Osteoporosis Brother   . Heart disease Brother   . Hypertension Brother   . Hyperlipidemia Brother   . Diabetes Brother    Social History   Occupational History  .      retired Immunologist   Social History Main Topics  . Smoking status: Former Smoker -- 50 years    Types: Cigarettes    Quit date: 08/14/2009  . Smokeless tobacco: Never Used  . Alcohol Use: No  . Drug Use: No  . Sexual Activity: No    Do you feel safe at home?  No Are there smokers in your home (other than you)?  No  Dietary issues and exercise activities discussed: Current Exercise Habits: Structured exercise class, Type of exercise: Other - see comments (stationary bike at [facility]), Time (Minutes): 35, Frequency (Times/Week): 5, Weekly Exercise (Minutes/Week): 175, Intensity: Moderate  Current Dietary habits:  Reports "craving" sweets;  No sodas (regular or diet) Drinks lots of water.  Eats potatoes and bread frequently  Cardiac Risk Factors include: advanced age (>5men, >66 women);dyslipidemia;family history of premature cardiovascular disease;male gender;hypertension;obesity (BMI >30kg/m2)  Objective:    Today's Vitals   10/25/15 0827  BP: 120/60  Pulse: 70  Height: 5' 5.5" (1.664 m)  Weight: 236 lb 8 oz (107.276 kg)  PainSc: 2   PainLoc: Hip   Body mass index is 38.74 kg/(m^2).   Activities of Daily Living In your present state of health, do you have any difficulty performing the following activities: 10/25/2015  Hearing? N  Vision? Y  Difficulty concentrating or making decisions? N  Walking or climbing stairs? Y  Dressing or bathing? N  Doing errands, shopping? N  Preparing Food and eating ? N  Using the Toilet? N  In the past six months, have you accidently leaked urine? N  Do you have problems with loss of bowel control? N  Managing your Medications? N  Managing your Finances? N  Housekeeping or managing your Housekeeping? N     Depression Screen PHQ 2/9 Scores 10/25/2015 06/15/2015 12/01/2014 06/29/2014  PHQ - 2 Score 0 0 0 0     Fall Risk Fall Risk  10/25/2015 06/15/2015 12/01/2014 06/29/2014 05/29/2014  Falls in the past year? No No No No No    Cognitive Function: No flowsheet data found.  Immunizations and Health Maintenance Immunization History  Administered Date(s) Administered  . Influenza,inj,Quad PF,36+ Mos 03/14/2014, 03/16/2015  . Pneumococcal Conjugate-13 12/29/2013  . Rabies, IM 01/10/2013, 01/17/2013, 01/24/2013, 01/31/2013  . Tdap 01/10/2013   Health  Maintenance Due  Topic Date Due  . PNA vac Low Risk Adult (2 of 2 - PPSV23) 12/30/2014  . COLON CANCER SCREENING ANNUAL FOBT  03/01/2015    Patient Care Team: Timmothy Euler, MD as PCP - General (Family Medicine) Nobie Putnam, MD (Hematology and Oncology) Irine Seal, MD as Attending Physician (Urology) Trellis Paganini, MD as Consulting Physician (Dermatology) Warden Fillers, MD as Consulting Physician (Ophthalmology)  Indicate any recent Medical Services you may have received from other than [facility] providers in the past year (date may be approximate).    Assessment:    Annual Wellness Visit  Prediabetes / elevated triglycerides - likely worsened by poor dietary habits.   Screening Tests Health Maintenance  Topic Date Due  . PNA vac Low Risk Adult (  2 of 2 - PPSV23) 12/30/2014  . COLON CANCER SCREENING ANNUAL FOBT  03/01/2015  . INFLUENZA VACCINE  01/15/2016  . COLONOSCOPY  03/01/2019  . TETANUS/TDAP  01/11/2023  . ZOSTAVAX  Completed        Plan:   During the course of the visit Raedyn was educated and counseled about the following appropriate screening and preventive services:   Vaccines to include Pneumoccal, Influenza,  Td, Zostavax - UTD on all vaccines  Colorectal cancer screening - colonoscopy is UTD; due FOBT - home test given in office today to take home and return  Cardiovascular disease screening - no EKG in several years - will consider at next PCP visit  Diabetes screening - A1c was 6.1% (06/15/2015) - prediabetes;  Not following any particular diet.  Bone Denisty / Osteoporosis Screening - last ws normal about 5 years ago  Glaucoma screening /  Eye Exam - UTD  Nutrition counseling - discussed limiting sugar and CHO intake (to lower Tg and BG).  He is to limit serving sizes of high CHO foods (discussed which these were).  Increase lean proteins, non starchy vegetables and whole grains.  Prostate cancer screening - UTD  Smoking cessation  counseling - patient has not smoked in over 5 years - he is commended and encouraged to continue smoking cessation  Advanced Directives - information given in office toay  Physical Activity - continue to exercise at [facility] 5 days per week.  Goal is 150 minutes per week.    Orders Placed This Encounter  Procedures  . Fecal occult blood, imunochemical    Standing Status: Future     Number of Occurrences:      Standing Expiration Date: 10/24/2016    Patient Instructions (the written plan) were given to the patient.   Cherre Robins, [facility]   10/25/2015

## 2015-10-25 NOTE — Patient Instructions (Addendum)
Lonnie Hurley , Thank you for taking time to come for your Medicare Wellness Visit. I appreciate your ongoing commitment to your health goals. Please review the following plan we discussed and let me know if I can assist you in the future.   These are the goals we discussed:   Dietary Recommendations to keep blood glucose / sugar and triglycerides low:  Increase non-starchy vegetables - carrots, green bean, squash, zucchini, tomatoes, onions, peppers, spinach and other green leafy vegetables, cabbage, lettuce, cucumbers, asparagus, okra (not fried), eggplant Limit sugar and processed foods (cakes, cookies, ice cream, crackers and chips) Increase fresh fruit but limit serving sizes 1/2 cup or about the size of tennis or baseball Limit red meat to no more than 1-2 times per week (serving size about the size of your palm) Choose whole grains / lean proteins - whole wheat bread, quinoa, whole grain rice (1/2 cup), fish, chicken, [place] Avoid sugar and calorie containing beverages - soda, sweet tea and juice.  Choose water or unsweetened tea instead.  Continue to drink water with meals  Continue bicycling at [facility] 5 days per week.    This is a list of the screening recommended for you and due dates:  Health Maintenance  Topic Date Due  . Pneumonia vaccines (2 of 2 - PPSV23) completed  . Stool Blood Test  03/01/2015  . Flu Shot  01/15/2016  . Colon Cancer Screening  03/01/2019  . Tetanus Vaccine  01/11/2023  . Shingles Vaccine  Completed    Health Maintenance, Male A healthy lifestyle and preventative care can promote health and wellness.  Maintain regular health, dental, and eye exams.  Eat a healthy diet. Foods like vegetables, fruits, whole grains, low-fat dairy products, and lean protein foods contain the nutrients you need and are low in calories. Decrease your intake of foods high in solid fats, added sugars, and salt. Get information about a proper diet from your health care  provider, if necessary.  Regular physical exercise is one of the most important things you can do for your health. Most adults should get at least 150 minutes of moderate-intensity exercise (any activity that increases your heart rate and causes you to sweat) each week. In addition, most adults need muscle-strengthening exercises on 2 or more days a week.   Maintain a healthy weight. The body mass index (BMI) is a screening tool to identify possible weight problems. It provides an estimate of body fat based on height and weight. Your health care provider can find your BMI and can help you achieve or maintain a healthy weight. For males 20 years and older:  A BMI below 18.5 is considered underweight.  A BMI of 18.5 to 24.9 is normal.  A BMI of 25 to 29.9 is considered overweight.  A BMI of 30 and above is considered obese.  Maintain normal blood lipids and cholesterol by exercising and minimizing your intake of saturated fat. Eat a balanced diet with plenty of fruits and vegetables. Blood tests for lipids and cholesterol should begin at age 60 and be repeated every 5 years. If your lipid or cholesterol levels are high, you are over age 22, or you are at high risk for heart disease, you may need your cholesterol levels checked more frequently.Ongoing high lipid and cholesterol levels should be treated with medicines if diet and exercise are not working.  If you smoke, find out from your health care provider how to quit. If you do not use tobacco, do not  start.  Lung cancer screening is recommended for adults aged 69-80 years who are at high risk for developing lung cancer because of a history of smoking. A yearly low-dose CT scan of the lungs is recommended for people who have at least a 30-pack-year history of smoking and are current smokers or have quit within the past 15 years. A pack year of smoking is smoking an average of 1 pack of cigarettes a day for 1 year (for example, a 30-pack-year  history of smoking could mean smoking 1 pack a day for 30 years or 2 packs a day for 15 years). Yearly screening should continue until the smoker has stopped smoking for at least 15 years. Yearly screening should be stopped for people who develop a health problem that would prevent them from having lung cancer treatment.  If you choose to drink alcohol, do not have more than 2 drinks per day. One drink is considered to be 12 oz (360 mL) of beer, 5 oz (150 mL) of wine, or 1.5 oz (45 mL) of liquor.  Avoid the use of street drugs. Do not share needles with anyone. Ask for help if you need support or instructions about stopping the use of drugs.  High blood pressure causes heart disease and increases the risk of stroke. High blood pressure is more likely to develop in:  People who have blood pressure in the end of the normal range (100-139/85-89 mm Hg).  People who are overweight or obese.  People who are African American.  If you are 45-68 years of age, have your blood pressure checked every 3-5 years. If you are 93 years of age or older, have your blood pressure checked every year. You should have your blood pressure measured twice--once when you are at a hospital or clinic, and once when you are not at a hospital or clinic. Record the average of the two measurements. To check your blood pressure when you are not at a hospital or clinic, you can use:  An automated blood pressure machine at a pharmacy.  A home blood pressure monitor.  If you are 73-55 years old, ask your health care provider if you should take aspirin to prevent heart disease.  Diabetes screening involves taking a blood sample to check your fasting blood sugar level. This should be done once every 3 years after age 64 if you are at a normal weight and without risk factors for diabetes. Testing should be considered at a younger age or be carried out more frequently if you are overweight and have at least 1 risk factor for  diabetes.  Colorectal cancer can be detected and often prevented. Most routine colorectal cancer screening begins at the age of 90 and continues through age 53. However, your health care provider may recommend screening at an earlier age if you have risk factors for colon cancer. On a yearly basis, your health care provider may provide home test kits to check for hidden blood in the stool. A small camera at the end of a tube may be used to directly examine the colon (sigmoidoscopy or colonoscopy) to detect the earliest forms of colorectal cancer. Talk to your health care provider about this at age 62 when routine screening begins. A direct exam of the colon should be repeated every 5-10 years through age 16, unless early forms of precancerous polyps or small growths are found.  People who are at an increased risk for hepatitis B should be screened for this virus. You  are considered at high risk for hepatitis B if:  You were born in a country where hepatitis B occurs often. Talk with your health care provider about which countries are considered high risk.  Your parents were born in a high-risk country and you have not received a shot to protect against hepatitis B (hepatitis B vaccine).  You have HIV or AIDS.  You use needles to inject street drugs.  You live with, or have sex with, someone who has hepatitis B.  You are a man who has sex with other men (MSM).  You get hemodialysis treatment.  You take certain medicines for conditions like cancer, organ transplantation, and autoimmune conditions.  Hepatitis C blood testing is recommended for all people born from [birth year] through 1963 and any individual with known risk factors for hepatitis C.  Healthy men should no longer receive prostate-specific antigen (PSA) blood tests as part of routine cancer screening. Talk to your health care provider about prostate cancer screening.  Testicular cancer screening is not recommended for adolescents or  adult males who have no symptoms. Screening includes self-exam, a health care provider exam, and other screening tests. Consult with your health care provider about any symptoms you have or any concerns you have about testicular cancer.  Practice safe sex. Use condoms and avoid high-risk sexual practices to reduce the spread of sexually transmitted infections (STIs).  You should be screened for STIs, including gonorrhea and chlamydia if:  You are sexually active and are younger than 24 years.  You are older than 24 years, and your health care provider tells you that you are at risk for this type of infection.  Your sexual activity has changed since you were last screened, and you are at an increased risk for chlamydia or gonorrhea. Ask your health care provider if you are at risk.  If you are at risk of being infected with HIV, it is recommended that you take a prescription medicine daily to prevent HIV infection. This is called pre-exposure prophylaxis (PrEP). You are considered at risk if:  You are a man who has sex with other men (MSM).  You are a heterosexual man who is sexually active with multiple partners.  You take drugs by injection.  You are sexually active with a partner who has HIV.  Talk with your health care provider about whether you are at high risk of being infected with HIV. If you choose to begin PrEP, you should first be tested for HIV. You should then be tested every 3 months for as long as you are taking PrEP.  Use sunscreen. Apply sunscreen liberally and repeatedly throughout the day. You should seek shade when your shadow is shorter than you. Protect yourself by wearing long sleeves, pants, a wide-brimmed hat, and sunglasses year round whenever you are outdoors.  Tell your health care provider of new moles or changes in moles, especially if there is a change in shape or color. Also, tell your health care provider if a mole is larger than the size of a pencil  eraser.  A one-time screening for abdominal aortic aneurysm (AAA) and surgical repair of large AAAs by ultrasound is recommended for men aged 40-75 years who are current or former smokers.  Stay current with your vaccines (immunizations).   This information is not intended to replace advice given to you by your health care provider. Make sure you discuss any questions you have with your health care provider.   Document Released: 11/29/2007 Document Revised:  06/23/2014 Document Reviewed: 10/28/2010 Elsevier Interactive Patient Education [employer].

## 2015-10-27 ENCOUNTER — Other Ambulatory Visit: Payer: PPO

## 2015-10-27 DIAGNOSIS — Z1211 Encounter for screening for malignant neoplasm of colon: Secondary | ICD-10-CM | POA: Diagnosis not present

## 2015-11-02 LAB — FECAL OCCULT BLOOD, IMMUNOCHEMICAL: Fecal Occult Bld: NEGATIVE

## 2015-11-05 ENCOUNTER — Telehealth: Payer: Self-pay | Admitting: Pharmacist

## 2015-11-05 NOTE — Telephone Encounter (Signed)
Patient notified of normal FOBT and copies of test left at front desk for his records.

## 2015-11-05 NOTE — Telephone Encounter (Signed)
FOBT was WNL and his brother, [name]'s labs were also WNL.  Lonnie Hurley is due to have Reclast infused and will send referral.

## 2015-11-13 DIAGNOSIS — N3941 Urge incontinence: Secondary | ICD-10-CM | POA: Diagnosis not present

## 2015-11-14 ENCOUNTER — Ambulatory Visit (INDEPENDENT_AMBULATORY_CARE_PROVIDER_SITE_OTHER): Payer: PPO | Admitting: Physician Assistant

## 2015-11-14 ENCOUNTER — Encounter: Payer: Self-pay | Admitting: Physician Assistant

## 2015-11-14 VITALS — BP 121/69 | HR 77 | Temp 97.0°F | Ht 65.5 in | Wt 229.6 lb

## 2015-11-14 DIAGNOSIS — J069 Acute upper respiratory infection, unspecified: Secondary | ICD-10-CM | POA: Diagnosis not present

## 2015-11-14 MED ORDER — AZITHROMYCIN 250 MG PO TABS: ORAL_TABLET | ORAL | Status: DC

## 2015-11-14 NOTE — Progress Notes (Signed)
Subjective:     Patient ID: Lonnie Hurley, male   DOB: [birth year], 75 y.o.   MRN: [id number]  HPI Pt with sinus congestion and cough Using OTC nasal decongest and saline drops No fever/chills Sx have not improved over the last week  Review of Systems  Constitutional: Negative.   HENT: Positive for congestion, postnasal drip, rhinorrhea, sinus pressure and sore throat. Negative for ear discharge, ear pain, nosebleeds and sneezing.   Respiratory: Positive for cough and wheezing. Negative for shortness of breath.   Cardiovascular: Negative.        Objective:   Physical Exam  Constitutional: He appears well-developed and well-nourished.  HENT:  Right Ear: External ear normal.  Left Ear: External ear normal.  Mouth/Throat: Oropharynx is clear and moist.  Neck: Neck supple.  Cardiovascular: Normal rate, regular rhythm and normal heart sounds.   Pulmonary/Chest: Effort normal. He has wheezes.  Exp wheezes noted primarily to upper lung fields  Lymphadenopathy:    He has no cervical adenopathy.  Nursing note and vitals reviewed.      Assessment:     1. Acute upper respiratory infection        Plan:     ZPack take as directed Fluids Rest Continue OTC meds fr sx F/U prn

## 2015-11-14 NOTE — Patient Instructions (Signed)
Upper Respiratory Infection, Adult Most upper respiratory infections (URIs) are a viral infection of the air passages leading to the lungs. A URI affects the nose, throat, and upper air passages. The most common type of URI is nasopharyngitis and is typically referred to as "the common cold." URIs run their course and usually go away on their own. Most of the time, a URI does not require medical attention, but sometimes a bacterial infection in the upper airways can follow a viral infection. This is called a secondary infection. Sinus and middle ear infections are common types of secondary upper respiratory infections. Bacterial pneumonia can also complicate a URI. A URI can worsen asthma and chronic obstructive pulmonary disease (COPD). Sometimes, these complications can require emergency medical care and may be life threatening.  CAUSES Almost all URIs are caused by viruses. A virus is a type of germ and can spread from one person to another.  RISKS FACTORS You may be at risk for a URI if:   You smoke.   You have chronic heart or lung disease.  You have a weakened defense (immune) system.   You are very young or very old.   You have nasal allergies or asthma.  You work in crowded or poorly ventilated areas.  You work in health care facilities or schools. SIGNS AND SYMPTOMS  Symptoms typically develop 2-3 days after you come in contact with a cold virus. Most viral URIs last 7-10 days. However, viral URIs from the influenza virus (flu virus) can last 14-18 days and are typically more severe. Symptoms may include:   Runny or stuffy (congested) nose.   Sneezing.   Cough.   Sore throat.   Headache.   Fatigue.   Fever.   Loss of appetite.   Pain in your forehead, behind your eyes, and over your cheekbones (sinus pain).  Muscle aches.  DIAGNOSIS  Your health care provider may diagnose a URI by:  Physical exam.  Tests to check that your symptoms are not due to  another condition such as:  Strep throat.  Sinusitis.  Pneumonia.  Asthma. TREATMENT  A URI goes away on its own with time. It cannot be cured with medicines, but medicines may be prescribed or recommended to relieve symptoms. Medicines may help:  Reduce your fever.  Reduce your cough.  Relieve nasal congestion. HOME CARE INSTRUCTIONS   Take medicines only as directed by your health care provider.   Gargle warm saltwater or take cough drops to comfort your throat as directed by your health care provider.  Use a warm mist humidifier or inhale steam from a shower to increase air moisture. This may make it easier to breathe.  Drink enough fluid to keep your urine clear or pale yellow.   Eat soups and other clear broths and maintain good nutrition.   Rest as needed.   Return to work when your temperature has returned to normal or as your health care provider advises. You may need to stay home longer to avoid infecting others. You can also use a face mask and careful hand washing to prevent spread of the virus.  Increase the usage of your inhaler if you have asthma.   Do not use any tobacco products, including cigarettes, chewing tobacco, or electronic cigarettes. If you need help quitting, ask your health care provider. PREVENTION  The best way to protect yourself from getting a cold is to practice good hygiene.   Avoid oral or hand contact with people with cold   symptoms.   Wash your hands often if contact occurs.  There is no clear evidence that vitamin C, vitamin E, echinacea, or exercise reduces the chance of developing a cold. However, it is always recommended to get plenty of rest, exercise, and practice good nutrition.  SEEK MEDICAL CARE IF:   You are getting worse rather than better.   Your symptoms are not controlled by medicine.   You have chills.  You have worsening shortness of breath.  You have brown or red mucus.  You have yellow or brown nasal  discharge.  You have pain in your face, especially when you bend forward.  You have a fever.  You have swollen neck glands.  You have pain while swallowing.  You have white areas in the back of your throat. SEEK IMMEDIATE MEDICAL CARE IF:   You have severe or persistent:  Headache.  Ear pain.  Sinus pain.  Chest pain.  You have chronic lung disease and any of the following:  Wheezing.  Prolonged cough.  Coughing up blood.  A change in your usual mucus.  You have a stiff neck.  You have changes in your:  Vision.  Hearing.  Thinking.  Mood. MAKE SURE YOU:   Understand these instructions.  Will watch your condition.  Will get help right away if you are not doing well or get worse.   This information is not intended to replace advice given to you by your health care provider. Make sure you discuss any questions you have with your health care provider.   Document Released: 11/26/2000 Document Revised: 10/17/2014 Document Reviewed: 09/07/2013 Elsevier Interactive Patient Education 2016 [employer]  

## 2015-12-04 DIAGNOSIS — N3941 Urge incontinence: Secondary | ICD-10-CM | POA: Diagnosis not present

## 2015-12-14 ENCOUNTER — Ambulatory Visit (INDEPENDENT_AMBULATORY_CARE_PROVIDER_SITE_OTHER): Payer: PPO | Admitting: Family

## 2015-12-14 ENCOUNTER — Encounter: Payer: Self-pay | Admitting: Family

## 2015-12-14 VITALS — BP 113/63 | HR 67 | Temp 97.0°F | Ht 65.5 in | Wt 231.6 lb

## 2015-12-14 DIAGNOSIS — W57XXXA Bitten or stung by nonvenomous insect and other nonvenomous arthropods, initial encounter: Secondary | ICD-10-CM | POA: Diagnosis not present

## 2015-12-14 DIAGNOSIS — E785 Hyperlipidemia, unspecified: Secondary | ICD-10-CM

## 2015-12-14 DIAGNOSIS — N400 Enlarged prostate without lower urinary tract symptoms: Secondary | ICD-10-CM | POA: Diagnosis not present

## 2015-12-14 DIAGNOSIS — S0080XA Unspecified superficial injury of other part of head, initial encounter: Secondary | ICD-10-CM

## 2015-12-14 DIAGNOSIS — E8881 Metabolic syndrome: Secondary | ICD-10-CM | POA: Diagnosis not present

## 2015-12-14 DIAGNOSIS — E559 Vitamin D deficiency, unspecified: Secondary | ICD-10-CM

## 2015-12-14 DIAGNOSIS — M5136 Other intervertebral disc degeneration, lumbar region: Secondary | ICD-10-CM | POA: Diagnosis not present

## 2015-12-14 DIAGNOSIS — E538 Deficiency of other specified B group vitamins: Secondary | ICD-10-CM

## 2015-12-14 DIAGNOSIS — I10 Essential (primary) hypertension: Secondary | ICD-10-CM | POA: Diagnosis not present

## 2015-12-14 DIAGNOSIS — G609 Hereditary and idiopathic neuropathy, unspecified: Secondary | ICD-10-CM | POA: Diagnosis not present

## 2015-12-14 DIAGNOSIS — R7303 Prediabetes: Secondary | ICD-10-CM

## 2015-12-14 DIAGNOSIS — M51369 Other intervertebral disc degeneration, lumbar region without mention of lumbar back pain or lower extremity pain: Secondary | ICD-10-CM

## 2015-12-14 MED ORDER — DOXYCYCLINE HYCLATE 100 MG PO TABS: 200.0000 mg | ORAL_TABLET | Freq: Once | ORAL | Status: DC

## 2015-12-14 MED ORDER — RAMIPRIL 10 MG PO CAPS: 10.0000 mg | ORAL_CAPSULE | Freq: Every day | ORAL | Status: DC

## 2015-12-14 NOTE — Patient Instructions (Signed)

## 2015-12-14 NOTE — Progress Notes (Signed)
Subjective:    Patient ID: Lonnie Hurley, male    DOB: 30-Dec-1940, 75 y.o.   MRN: [id number]  Pt presents to the office today for chronic follow up. PT is followed by Dr. Rayann Heman, [facility],  for BPH. Pt states he removed one tick off his back of his head last night and is unsure how long it was attached. PT denies any fever, rash, or new joint pain.  Hypertension This is a chronic problem. The current episode started more than 1 year ago. The problem has been resolved since onset. The problem is controlled. Pertinent negatives include no anxiety, chest pain, headaches, palpitations, peripheral edema or shortness of breath. Risk factors for coronary artery disease include dyslipidemia and male gender. Past treatments include ACE inhibitors and diuretics. The current treatment provides moderate improvement. There is no history of kidney disease, CAD/MI, CVA, heart failure or a thyroid problem. There is no history of sleep apnea.  Hyperlipidemia This is a chronic problem. The current episode started more than 1 year ago. The problem is controlled. Recent lipid tests were reviewed and are normal. Exacerbating diseases include obesity. He has no history of diabetes or hypothyroidism. Pertinent negatives include no chest pain, leg pain, myalgias or shortness of breath. Current antihyperlipidemic treatment includes statins and nicotinic acid. The current treatment provides significant improvement of lipids. Risk factors for coronary artery disease include dyslipidemia, hypertension, male sex and obesity.  Benign Prostatic Hypertrophy This is a chronic (Pt sees Dr. Jettie Pagan) problem. The current episode started more than 1 year ago. Irritative symptoms include frequency and nocturia. Pertinent negatives include no hematuria, nausea or vomiting. Past treatments include tamsulosin. The treatment provided moderate relief.  Peripheral Neuropathy  Pt currently taking gabapentin 300 mg as needed for  tingling and  burning in his right foot.     Review of Systems  Constitutional: Negative.   HENT: Negative.   Respiratory: Negative.  Negative for shortness of breath.   Cardiovascular: Negative.  Negative for chest pain and palpitations.  Gastrointestinal: Negative.  Negative for nausea and vomiting.  Endocrine: Negative.   Genitourinary: Positive for frequency and nocturia. Negative for hematuria.  Musculoskeletal: Negative.  Negative for myalgias.  Neurological: Negative.  Negative for headaches.  Hematological: Negative.   Psychiatric/Behavioral: Negative.   All other systems reviewed and are negative.      Objective:   Physical Exam  Constitutional: He is oriented to person, place, and time. He appears well-developed and well-nourished. No distress.  HENT:  Head: Normocephalic.  Right Ear: External ear normal.  Left Ear: External ear normal.  Nose: Nose normal.  Mouth/Throat: Oropharynx is clear and moist.  Eyes: Pupils are equal, round, and reactive to light. Right eye exhibits no discharge. Left eye exhibits no discharge.  Neck: Normal range of motion. Neck supple. No thyromegaly present.  Cardiovascular: Normal rate, regular rhythm, normal heart sounds and intact distal pulses.   No murmur heard. Pulmonary/Chest: Effort normal and breath sounds normal. No respiratory distress. He has no wheezes.  Abdominal: Soft. Bowel sounds are normal. He exhibits no distension. There is no tenderness.  Musculoskeletal: Normal range of motion. He exhibits no edema or tenderness.  Neurological: He is alert and oriented to person, place, and time. He has normal reflexes. No cranial nerve deficit.  Skin: Skin is warm and dry. No rash noted. No erythema.  Psychiatric: He has a normal mood and affect. His behavior is normal. Judgment and thought content normal.  Vitals reviewed.  BP 113/63 mmHg  Pulse 67  Temp(Src) 97 F (36.1 C) (Oral)  Ht 5' 5.5" (1.664 m)  Wt 231 lb 9.6 oz (105.053 kg)   BMI 37.94 kg/m2     Assessment & Plan:  1. Essential hypertension - ramipril (ALTACE) 10 MG capsule; Take 1 capsule (10 mg total) by mouth daily.  Dispense: 90 capsule; Refill: 3 - CMP14+EGFR - CBC with Differential/Platelet  2. Vitamin B 12 deficiency  - CMP14+EGFR - CBC with Differential/Platelet  3. Hereditary and idiopathic peripheral neuropathy - CMP14+EGFR - CBC with Differential/Platelet  4. DDD (degenerative disc disease), lumbar - CMP14+EGFR - CBC with Differential/Platelet  5. BPH (benign prostatic hyperplasia) - CMP14+EGFR - CBC with Differential/Platelet  6. Hyperlipidemia - CMP14+EGFR - Lipid panel - CBC with Differential/Platelet  7. Vitamin D deficiency - CMP14+EGFR - CBC with Differential/Platelet  8. Metabolic syndrome - OFH21+FXJO - CBC with Differential/Platelet  9. Pre-diabetes - CMP14+EGFR - CBC with Differential/Platelet  10. Tick bite --Pt to report any new fever, joint pain, or rash -Wear protective clothing while outside- Long sleeves and long pants -Put insect repellent on all exposed skin and along clothing -Take a shower as soon as possible after being outside -RTO prn - doxycycline (VIBRA-TABS) 100 MG tablet; Take 2 tablets (200 mg total) by mouth once. 1 po bid  Dispense: 2 tablet; Refill: 0   Continue all meds Labs pending Health Maintenance reviewed Diet and exercise encouraged RTO 6 months  [name], FNP

## 2015-12-15 ENCOUNTER — Other Ambulatory Visit: Payer: Self-pay | Admitting: Nurse Practitioner

## 2015-12-15 DIAGNOSIS — W57XXXA Bitten or stung by nonvenomous insect and other nonvenomous arthropods, initial encounter: Secondary | ICD-10-CM

## 2015-12-15 LAB — CBC WITH DIFFERENTIAL/PLATELET
BASOS ABS: 0 10*3/uL (ref 0.0–0.2)
Basos: 0 %
EOS (ABSOLUTE): 0.2 10*3/uL (ref 0.0–0.4)
Eos: 3 %
HEMOGLOBIN: 14 g/dL (ref 12.6–17.7)
Hematocrit: 41.1 % (ref 37.5–51.0)
Immature Grans (Abs): 0 10*3/uL (ref 0.0–0.1)
Immature Granulocytes: 0 %
LYMPHS ABS: 3.4 10*3/uL — AB (ref 0.7–3.1)
Lymphs: 38 %
MCH: 30.2 pg (ref 26.6–33.0)
MCHC: 34.1 g/dL (ref 31.5–35.7)
MCV: 89 fL (ref 79–97)
MONOS ABS: 1.1 10*3/uL — AB (ref 0.1–0.9)
Monocytes: 12 %
NEUTROS ABS: 4.3 10*3/uL (ref 1.4–7.0)
Neutrophils: 47 %
PLATELETS: 131 10*3/uL — AB (ref 150–379)
RBC: 4.64 x10E6/uL (ref 4.14–5.80)
RDW: 13.6 % (ref 12.3–15.4)
WBC: 9 10*3/uL (ref 3.4–10.8)

## 2015-12-15 LAB — CMP14+EGFR
ALBUMIN: 3.9 g/dL (ref 3.5–4.8)
ALK PHOS: 50 IU/L (ref 39–117)
ALT: 19 IU/L (ref 0–44)
AST: 23 IU/L (ref 0–40)
Albumin/Globulin Ratio: 1.6 (ref 1.2–2.2)
BILIRUBIN TOTAL: 0.6 mg/dL (ref 0.0–1.2)
BUN / CREAT RATIO: 15 (ref 10–24)
BUN: 15 mg/dL (ref 8–27)
CHLORIDE: 101 mmol/L (ref 96–106)
CO2: 29 mmol/L (ref 18–29)
Calcium: 8.8 mg/dL (ref 8.6–10.2)
Creatinine, Ser: 0.98 mg/dL (ref 0.76–1.27)
GFR calc Af Amer: 89 mL/min/{1.73_m2} (ref >59)
GFR calc non Af Amer: 77 mL/min/{1.73_m2} (ref >59)
GLOBULIN, TOTAL: 2.4 g/dL (ref 1.5–4.5)
GLUCOSE: 78 mg/dL (ref 65–99)
Potassium: 3.7 mmol/L (ref 3.5–5.2)
SODIUM: 144 mmol/L (ref 134–144)
Total Protein: 6.3 g/dL (ref 6.0–8.5)

## 2015-12-15 LAB — LIPID PANEL
CHOL/HDL RATIO: 3.1 ratio (ref 0.0–5.0)
Cholesterol, Total: 120 mg/dL (ref 100–199)
HDL: 39 mg/dL — AB (ref >39)
LDL CALC: 48 mg/dL (ref 0–99)
TRIGLYCERIDES: 167 mg/dL — AB (ref 0–149)
VLDL CHOLESTEROL CAL: 33 mg/dL (ref 5–40)

## 2015-12-15 MED ORDER — DOXYCYCLINE HYCLATE 100 MG PO TABS: 200.0000 mg | ORAL_TABLET | Freq: Once | ORAL | Status: DC

## 2015-12-24 ENCOUNTER — Encounter: Payer: Self-pay | Admitting: Family

## 2015-12-24 ENCOUNTER — Ambulatory Visit (INDEPENDENT_AMBULATORY_CARE_PROVIDER_SITE_OTHER): Payer: PPO

## 2015-12-24 ENCOUNTER — Other Ambulatory Visit: Payer: PPO

## 2015-12-24 ENCOUNTER — Ambulatory Visit (INDEPENDENT_AMBULATORY_CARE_PROVIDER_SITE_OTHER): Payer: PPO | Admitting: Family

## 2015-12-24 VITALS — BP 109/66 | HR 82 | Temp 96.9°F | Ht 65.5 in | Wt 233.8 lb

## 2015-12-24 DIAGNOSIS — S62609A Fracture of unspecified phalanx of unspecified finger, initial encounter for closed fracture: Secondary | ICD-10-CM | POA: Diagnosis not present

## 2015-12-24 DIAGNOSIS — S6992XA Unspecified injury of left wrist, hand and finger(s), initial encounter: Secondary | ICD-10-CM

## 2015-12-24 NOTE — Progress Notes (Signed)
   Subjective:    Patient ID: Lonnie Hurley, male    DOB: 1940-09-23, 75 y.o.   MRN: [id number]  Hand Pain  The incident occurred 3 to 5 days ago. The incident occurred in the yard. The injury mechanism was a direct blow. The pain is present in the left hand. The quality of the pain is described as aching. The pain is at a severity of 3/10. The pain is moderate. The pain has been intermittent since the incident. Pertinent negatives include no numbness or tingling. Nothing aggravates the symptoms. He has tried acetaminophen for the symptoms. The treatment provided mild relief.      Review of Systems  Respiratory: Negative.   Cardiovascular: Negative.   Gastrointestinal: Negative.   Genitourinary: Negative.   Musculoskeletal: Positive for joint swelling.  Neurological: Negative for tingling and numbness.       Objective:   Physical Exam  Constitutional: He is oriented to person, place, and time. He appears well-developed and well-nourished. No distress.  HENT:  Head: Normocephalic.  Cardiovascular: Normal rate, regular rhythm, normal heart sounds and intact distal pulses.   No murmur heard. Pulmonary/Chest: Effort normal and breath sounds normal. No respiratory distress. He has no wheezes.  Abdominal: Soft. Bowel sounds are normal. He exhibits no distension. There is no tenderness.  Musculoskeletal: Normal range of motion. He exhibits edema (trace amt of swelling in left middle and ring finger, eccyhmosis present). He exhibits no tenderness.  Neurological: He is alert and oriented to person, place, and time.  Skin: Skin is warm and dry. No rash noted. There is erythema.  Psychiatric: He has a normal mood and affect. His behavior is normal. Judgment and thought content normal.  Vitals reviewed.   BP 109/66 mmHg  Pulse 82  Temp(Src) 96.9 F (36.1 C) (Oral)  Ht 5' 5.5" (1.664 m)  Wt 233 lb 12.8 oz (106.051 kg)  BMI 38.30 kg/m2  X-ray- Small fracture at joint of left middle  finger Preliminary reading by [name], FNP [facility]      Assessment & Plan:  1. Injury, fingers, left, initial encounter - DG Hand Complete Left; Future  2. Finger fracture, left, closed, initial encounter  Rest Ice Buddy taped middle and ring finger Tylenol and motrin prn for pain RTO prn   [name], FNP

## 2015-12-24 NOTE — Patient Instructions (Signed)
Finger Fracture  Fractures of fingers are breaks in the bones of the fingers. There are many types of fractures. There are different ways of treating these fractures. Your health care provider will discuss the best way to treat your fracture.  CAUSES  Traumatic injury is the main cause of broken fingers. These include:  · Injuries while playing sports.  · Workplace injuries.  · Falls.  RISK FACTORS  Activities that can increase your risk of finger fractures include:  · Sports.  · Workplace activities that involve machinery.  · A condition called osteoporosis, which can make your bones less dense and cause them to fracture more easily.  SIGNS AND SYMPTOMS  The main symptoms of a broken finger are pain and swelling within 15 minutes after the injury. Other symptoms include:  · Bruising of your finger.  · Stiffness of your finger.  · Numbness of your finger.  · Exposed bones (compound fracture) if the fracture is severe.  DIAGNOSIS   The best way to diagnose a broken bone is with X-ray imaging. Additionally, your health care provider will use this X-ray image to evaluate the position of the broken finger bones.   TREATMENT   Finger fractures can be treated with:   · Nonreduction--This means the bones are in place. The finger is splinted without changing the positions of the bone pieces. The splint is usually left on for about a week to 10 days. This will depend on your fracture and what your health care provider thinks.  · Closed reduction--The bones are put back into position without using surgery. The finger is then splinted.  · Open reduction and internal fixation--The fracture site is opened. Then the bone pieces are fixed into place with pins or some type of hardware. This is seldom required. It depends on the severity of the fracture.  HOME CARE INSTRUCTIONS   · Follow your health care provider's instructions regarding activities, exercises, and physical therapy.  · Only take over-the-counter or prescription  medicines for pain, discomfort, or fever as directed by your health care provider.  SEEK MEDICAL CARE IF:  You have pain or swelling that limits the motion or use of your fingers.  SEEK IMMEDIATE MEDICAL CARE IF:   Your finger becomes numb.  MAKE SURE YOU:   · Understand these instructions.  · Will watch your condition.  · Will get help right away if you are not doing well or get worse.     This information is not intended to replace advice given to you by your health care provider. Make sure you discuss any questions you have with your health care provider.     Document Released: 09/14/2000 Document Revised: 03/23/2013 Document Reviewed: 01/12/2013  Elsevier Interactive Patient Education ©2016 [employer].

## 2015-12-25 DIAGNOSIS — N3941 Urge incontinence: Secondary | ICD-10-CM | POA: Diagnosis not present

## 2016-01-03 DIAGNOSIS — N401 Enlarged prostate with lower urinary tract symptoms: Secondary | ICD-10-CM | POA: Diagnosis not present

## 2016-01-10 DIAGNOSIS — N401 Enlarged prostate with lower urinary tract symptoms: Secondary | ICD-10-CM | POA: Diagnosis not present

## 2016-01-10 DIAGNOSIS — R3129 Other microscopic hematuria: Secondary | ICD-10-CM | POA: Diagnosis not present

## 2016-01-10 DIAGNOSIS — N3941 Urge incontinence: Secondary | ICD-10-CM | POA: Diagnosis not present

## 2016-01-10 DIAGNOSIS — R351 Nocturia: Secondary | ICD-10-CM | POA: Diagnosis not present

## 2016-01-15 DIAGNOSIS — N3941 Urge incontinence: Secondary | ICD-10-CM | POA: Diagnosis not present

## 2016-01-21 ENCOUNTER — Other Ambulatory Visit: Payer: Self-pay | Admitting: Family Medicine

## 2016-01-21 ENCOUNTER — Ambulatory Visit (INDEPENDENT_AMBULATORY_CARE_PROVIDER_SITE_OTHER): Payer: PPO | Admitting: Family Medicine

## 2016-01-21 ENCOUNTER — Ambulatory Visit (INDEPENDENT_AMBULATORY_CARE_PROVIDER_SITE_OTHER): Payer: PPO

## 2016-01-21 DIAGNOSIS — G609 Hereditary and idiopathic neuropathy, unspecified: Secondary | ICD-10-CM | POA: Diagnosis not present

## 2016-01-21 DIAGNOSIS — M25572 Pain in left ankle and joints of left foot: Secondary | ICD-10-CM

## 2016-01-21 MED ORDER — DICLOFENAC SODIUM 1 % TD GEL: 4.0000 g | Freq: Four times a day (QID) | TRANSDERMAL | 0 refills | Status: DC

## 2016-01-21 MED ORDER — GABAPENTIN 300 MG PO CAPS: 300.0000 mg | ORAL_CAPSULE | Freq: Three times a day (TID) | ORAL | 1 refills | Status: DC

## 2016-01-21 NOTE — Progress Notes (Signed)
   HPI  Patient presents today here with left ankle pain.  Patient's lines over the last week it's been bothering him more and more. His symptoms are predominantly at night. He has also had some left lateral hip pain and has a history of degenerative disc disease.  He states that he has throbbing left lateral malleolus pain with no radiation. It is not distinctly associated with the left lateral hip pain.  He denies any loss of function. It has interrupted his sleep.  He has tried his usual topical ointments with no improvement. He has not been using gabapentin lately. Tolerates 1 gabapentin daily easily, when he increases to twice a he has decreased mental sharpness and does not tolerate it well.  PMH: Smoking status noted ROS: Per HPI  Objective: BP 140/80 (BP Location: Left Arm, Patient Position: Sitting, Cuff Size: Large)   Pulse 72   Temp 97.2 F (36.2 C) (Oral)   Ht 5\' 5"  (1.651 m)   Wt 233 lb (105.7 kg)   BMI 38.77 kg/m  Gen: NAD, alert, cooperative with exam HEENT: NCAT, CV: RRR, good S1/S2, no murmur Resp: CTABL, no wheezes, non-labored Ext: No edema, warm Neuro: Alert and oriented, No gross deficits  Plain film without acute findings on my read- Heel spurs present  Assessment and plan:  # Ankle pain Unclear etiology, possibly related to degenerative disc disease, also possibly isolated ankle issue. Recommended conservative treatment with Voltaren gel, and starting gabapentin every night. Return to clinic if not improving within 1 week. Return to clinic with any other concerns as well.   Meds ordered this encounter  Medications  . diclofenac sodium (VOLTAREN) 1 % GEL    Sig: Apply 4 g topically 4 (four) times daily.    Dispense:  100 g    Refill:  0  . gabapentin (NEURONTIN) 300 MG capsule    Sig: Take 1 capsule (300 mg total) by mouth 3 (three) times daily.    Dispense:  90 capsule    Refill:  [place], MD [facility] 01/21/2016, 4:10 PM

## 2016-01-21 NOTE — Patient Instructions (Signed)
Great to see you!  Start gabapentin, 1 pill every night  Try voltaren gel, if this is too expensive then try aspercreme.

## 2016-01-28 DIAGNOSIS — Z85828 Personal history of other malignant neoplasm of skin: Secondary | ICD-10-CM | POA: Diagnosis not present

## 2016-01-28 DIAGNOSIS — L821 Other seborrheic keratosis: Secondary | ICD-10-CM | POA: Diagnosis not present

## 2016-01-28 DIAGNOSIS — C44319 Basal cell carcinoma of skin of other parts of face: Secondary | ICD-10-CM | POA: Diagnosis not present

## 2016-01-28 DIAGNOSIS — D485 Neoplasm of uncertain behavior of skin: Secondary | ICD-10-CM | POA: Diagnosis not present

## 2016-01-28 DIAGNOSIS — L570 Actinic keratosis: Secondary | ICD-10-CM | POA: Diagnosis not present

## 2016-02-11 DIAGNOSIS — C44319 Basal cell carcinoma of skin of other parts of face: Secondary | ICD-10-CM | POA: Diagnosis not present

## 2016-02-11 DIAGNOSIS — Z85828 Personal history of other malignant neoplasm of skin: Secondary | ICD-10-CM | POA: Diagnosis not present

## 2016-02-12 ENCOUNTER — Ambulatory Visit (INDEPENDENT_AMBULATORY_CARE_PROVIDER_SITE_OTHER): Payer: PPO | Admitting: Family Medicine

## 2016-02-12 ENCOUNTER — Encounter: Payer: Self-pay | Admitting: Family Medicine

## 2016-02-12 DIAGNOSIS — M79605 Pain in left leg: Secondary | ICD-10-CM | POA: Diagnosis not present

## 2016-02-12 DIAGNOSIS — G8929 Other chronic pain: Secondary | ICD-10-CM

## 2016-02-12 MED ORDER — PREGABALIN 150 MG PO CAPS: 150.0000 mg | ORAL_CAPSULE | Freq: Every evening | ORAL | 1 refills | Status: DC

## 2016-02-12 NOTE — Progress Notes (Signed)
Subjective:  Patient ID: Laken Fincannon, male    DOB: Sep 29, 1940  Age: 75 y.o. MRN: [id number]  CC: Left leg pain (worsening, worse when walking or laying down)   HPI Verle Gelsinger presents for Pain in the left lower extremity that is continually worsening. He describes the pain at the left lateral thigh over the trochanteric bursa region as 5-6/10 and stinging. There is no radiation to her from the buttocks region or the back. The pain will radiate down to the inner aspect of the knee. It progresses to the lateral malleolus of the left ankle. He says that the pain becomes a hard pain at night when he lays down. He had a knee replacement 8 years ago. Since that time he has had some pain. It used to be made worse when he mowed the lawn. Now lesser activities affect the pain as well. Of note is that he had an ankle x-ray 3 weeks ago that showed heel spurs. 2 years ago he had an arterial ultrasound that was normal for left lower extremity. Pain has progressed and become much worse since that time. History Darren has a past medical history of BPH (benign prostatic hypertrophy); Cancer ([place]); Cataract; Colon polyps; DDD (degenerative disc disease); Hearing loss; Hypercholesteremia; Hyperlipidemia; Hypertension; Leukoplakia; Microscopic hematuria; Thrombocytopenia ([place]); Tinnitus; Tobacco abuse; and Vitamin D deficiency.   He has a past surgical history that includes Left Knee Repair (11-10-2006); Hernia repair (1980); Nose surgery; Cataract extraction (2007); Knee surgery (02-28-2008); Colonoscopy; Polypectomy; Skin biopsy (10-08-2006); and Cataract extraction (Left, 03/21/15).   His family history includes Diabetes in his brother; Glaucoma in his mother; Heart disease in his brother; Heart failure in his father; Hyperlipidemia in his brother; Hypertension in his brother; Osteoporosis in his brother.He reports that he quit smoking about 6 years ago. His smoking use included Cigarettes. He quit after 50.00  years of use. He has never used smokeless tobacco. He reports that he does not drink alcohol or use drugs.    ROS Review of Systems  Constitutional: Negative for chills, diaphoresis, fever and unexpected weight change.  HENT: Positive for hearing loss.   Respiratory: Negative for cough and shortness of breath.   Cardiovascular: Negative for chest pain.  Gastrointestinal: Negative for abdominal pain.  Genitourinary: Negative for dysuria and flank pain.  Musculoskeletal: Positive for arthralgias and myalgias. Negative for back pain and joint swelling.  Skin: Negative for rash.  Neurological: Negative for dizziness, weakness, numbness and headaches.  Psychiatric/Behavioral: Negative for dysphoric mood and sleep disturbance.    Objective:  BP 121/71 (BP Location: Left Arm, Patient Position: Sitting, Cuff Size: Large)   Pulse 74   Temp 97 F (36.1 C) (Oral)   Ht 5\' 5"  (1.651 m)   Wt 240 lb (108.9 kg)   SpO2 96%   BMI 39.94 kg/m   BP Readings from Last 3 Encounters:  02/12/16 121/71  01/21/16 140/80  12/24/15 109/66    Wt Readings from Last 3 Encounters:  02/12/16 240 lb (108.9 kg)  01/21/16 233 lb (105.7 kg)  12/24/15 233 lb 12.8 oz (106.1 kg)     Physical Exam  Constitutional: He is oriented to person, place, and time. He appears well-developed and well-nourished. No distress.  HENT:  Head: Normocephalic and atraumatic.  Right Ear: External ear normal.  Left Ear: External ear normal.  Nose: Nose normal.  Mouth/Throat: Oropharynx is clear and moist.  Eyes: Conjunctivae and EOM are normal. Pupils are equal, round, and reactive to light.  Neck:  Normal range of motion. Neck supple. No thyromegaly present.  Cardiovascular: Normal rate, regular rhythm and normal heart sounds.   No murmur heard. Pulmonary/Chest: Effort normal and breath sounds normal. No respiratory distress. He has no wheezes. He has no rales.  Abdominal: Soft. Bowel sounds are normal. He exhibits no  distension. There is no tenderness.  Musculoskeletal: Normal range of motion.  Lymphadenopathy:    He has no cervical adenopathy.  Neurological: He is alert and oriented to person, place, and time. He has normal reflexes.  Skin: Skin is warm and dry.  Psychiatric: He has a normal mood and affect. His behavior is normal. Judgment and thought content normal.     Lab Results  Component Value Date   WBC 9.0 12/14/2015   HGB 15.8 05/22/2014   HCT 41.1 12/14/2015   PLT 131 (L) 12/14/2015   GLUCOSE 78 12/14/2015   CHOL 120 12/14/2015   TRIG 167 (H) 12/14/2015   HDL 39 (L) 12/14/2015   LDLCALC 48 12/14/2015   ALT 19 12/14/2015   AST 23 12/14/2015   NA 144 12/14/2015   K 3.7 12/14/2015   CL 101 12/14/2015   CREATININE 0.98 12/14/2015   BUN 15 12/14/2015   CO2 29 12/14/2015   TSH 2.300 05/22/2014   PSA 1.0 06/23/2013   HGBA1C 5.9 06/15/2015    US Arterial Seg Single  Result Date: 01/09/2014 CLINICAL DATA:  Bilateral lower extremity claudication symptoms, right greater than left. History of hypertension, hyperlipidemia, former smoker. Evaluate for PAD. EXAM: NONINVASIVE PHYSIOLOGIC VASCULAR STUDY OF BILATERAL LOWER EXTREMITIES TECHNIQUE: Evaluation of both lower extremities were performed at rest, including calculation of ankle-brachial indices with single level Doppler, pressure and pulse volume recording. COMPARISON:  None. FINDINGS: Right Lower Extremity: ABI - 1.12; triphasic waveforms are demonstrated within the right posterior tibial artery. Biphasic waveforms are demonstrated within the right dorsalis pedis artery. Left Lower Extremity: ABI - 1.1; triphasic waveforms are demonstrated within the left posterior tibial artery. Biphasic waveforms are demonstrated within the left dorsalis pedis artery. ABI reference: >1.2: Abnormal vessel hardening 1.0 - 1.2: Normal range 0.9 - 1.0: Acceptable 0.8 - 0.9: Mild arterial disease 0.5 - 0.8: Moderate arterial disease <0.5: Severe arterial  disease IMPRESSION: No evidence of hemodynamically significant vasoocclusive disease affecting either lower extremity on this resting examination. Electronically Signed   By: Sandi Mariscal M.D.   On: 01/09/2014 16:00    Assessment & Plan:   Benicio was seen today for left leg pain.  Diagnoses and all orders for this visit:  Chronic pain of left lower extremity -     Nerve conduction test; Future -     Ultrasound doppler arterial leg left; Future  Other orders -     pregabalin (LYRICA) 150 MG capsule; Take 1 capsule (150 mg total) by mouth every evening.   I have discontinued Mr. Amodio's gabapentin. I am also having him start on pregabalin. Additionally, I am having him maintain his tamsulosin, Vitamin D3, multivitamin with minerals, Fish Oil, CALCIUM-MAGNESIUM PO, glucosamine-chondroitin, nystatin-triamcinolone, ezetimibe, hydrochlorothiazide, simvastatin, niacin, ramipril, and diclofenac sodium.  Meds ordered this encounter  Medications  . pregabalin (LYRICA) 150 MG capsule    Sig: Take 1 capsule (150 mg total) by mouth every evening.    Dispense:  30 capsule    Refill:  1     Follow-up: Return in about 2 weeks (around 02/26/2016).  Claretta Fraise, M.D.

## 2016-02-14 ENCOUNTER — Other Ambulatory Visit: Payer: Self-pay

## 2016-02-14 ENCOUNTER — Telehealth: Payer: Self-pay

## 2016-02-14 DIAGNOSIS — M79605 Pain in left leg: Secondary | ICD-10-CM

## 2016-02-14 DIAGNOSIS — M7989 Other specified soft tissue disorders: Principal | ICD-10-CM

## 2016-02-14 NOTE — Telephone Encounter (Signed)
Thanks! Will take care of it now. WS 

## 2016-02-15 ENCOUNTER — Ambulatory Visit (HOSPITAL_COMMUNITY)
Admission: RE | Admit: 2016-02-15 | Discharge: 2016-02-15 | Disposition: A | Discharge status: Home / Self Care | Payer: PPO | Source: Ambulatory Visit | Attending: Family Medicine | Admitting: Family Medicine

## 2016-02-15 ENCOUNTER — Other Ambulatory Visit: Payer: Self-pay | Admitting: *Deleted

## 2016-02-15 DIAGNOSIS — G8929 Other chronic pain: Secondary | ICD-10-CM

## 2016-02-15 DIAGNOSIS — M79605 Pain in left leg: Secondary | ICD-10-CM | POA: Insufficient documentation

## 2016-02-15 DIAGNOSIS — M79652 Pain in left thigh: Secondary | ICD-10-CM | POA: Diagnosis not present

## 2016-02-19 ENCOUNTER — Ambulatory Visit (INDEPENDENT_AMBULATORY_CARE_PROVIDER_SITE_OTHER): Payer: PPO | Admitting: Neurology

## 2016-02-19 ENCOUNTER — Other Ambulatory Visit: Payer: Self-pay | Admitting: Family Medicine

## 2016-02-19 DIAGNOSIS — M79605 Pain in left leg: Secondary | ICD-10-CM

## 2016-02-19 DIAGNOSIS — G8929 Other chronic pain: Secondary | ICD-10-CM

## 2016-02-19 DIAGNOSIS — M5417 Radiculopathy, lumbosacral region: Secondary | ICD-10-CM

## 2016-02-19 DIAGNOSIS — M5416 Radiculopathy, lumbar region: Secondary | ICD-10-CM

## 2016-02-19 NOTE — Procedures (Signed)
[facility]  [address], [address]  [place], [place] [address] Tel: [phone] Fax:  [phone] Test Date:  02/19/2016  Patient: Lonnie Hurley DOB: 03-16-1941 Physician: Narda Amber, DO  Sex: Male Height: 5\' 6"  Ref Phys: Dr.Stacks  ID#: [id number] Temp: 33.2C Technician: Jerilynn Mages. Dean   Patient Complaints: This is a 75 year old gentleman referred for evaluation of left lateral thigh burning pain, as well as left medial knee and ankle pain.  NCV & EMG Findings: Extensive electrodiagnostic testing of the left lower extremity shows: 1. Left sural and superficial peroneal sensory responses are within normal limits. 2. Left tibial and peroneal motor responses are within normal limits. 3. Chronic motor axon loss changes are seen affecting the L5-S1 myotomes, without accompanied active denervation.  Impression: 1. Chronic L5-S1 radiculopathy affecting the left lower extremity, mild-to-moderate in degree electrically. 2. There is no evidence of a large fiber sensorimotor polyneuropathy.    ___________________________ Narda Amber, DO    Nerve Conduction Studies Anti Sensory Summary Table   Stim Site NR Peak (ms) Norm Peak (ms) P-T Amp (V) Norm P-T Amp  Left Sup Peroneal Anti Sensory (Ant Lat Mall)  33.2C  12 cm    3.4 <4.6 6.1 >3  Left Sural Anti Sensory (Lat Mall)  33.2C  Calf    3.4 <4.6 5.8 >3   Motor Summary Table   Stim Site NR Onset (ms) Norm Onset (ms) O-P Amp (mV) Norm O-P Amp Site1 Site2 Delta-0 (ms) Dist (cm) Vel (m/s) Norm Vel (m/s)  Left Peroneal Motor (Ext Dig Brev)  33.2C  Ankle    4.1 <6.0 3.7 >2.5 B Fib Ankle 7.6 32.0 42 >40  B Fib    11.7  3.5  Poplt B Fib 2.3 10.0 43 >40  Poplt    14.0  3.3         Left Tibial Motor (Abd Hall Brev)  33.2C  Ankle    3.4 <6.0 7.6 >4 Knee Ankle 9.7 40.0 41 >40  Knee    13.1  6.1          H Reflex Studies   NR H-Lat (ms) Lat Norm (ms) L-R H-Lat (ms) M-Lat (ms) HLat-MLat (ms)  Left Tibial (Gastroc)  33.2C      33.88 <35 0.82 4.35 29.53  Right Tibial (Gastroc)  33.2C     34.69 <35 0.82 4.35 30.34   EMG   Side Muscle Ins Act Fibs Psw Fasc Number Recrt Dur Dur. Amp Amp. Poly Poly. Comment  Left AntTibialis Nml Nml Nml Nml 1- Mod-R Few 1+ Few 1+ Nml Nml N/A  Left Gastroc Nml Nml Nml Nml 2- Rapid Some 1+ Some 1+ Nml Nml N/A  Left Flex Dig Long Nml Nml Nml Nml 1- Rapid Some 1+ Some 1+ Nml Nml N/A  Left RectFemoris Nml Nml Nml Nml Nml Nml Nml Nml Nml Nml Nml Nml N/A  Left GluteusMed Nml Nml Nml Nml 1- Rapid Some 1+ Some 1+ Nml Nml N/A  Left BicepsFemS Nml Nml Nml Nml 1- Rapid Some 1+ Some 1+ Nml Nml N/A  Left Lumbo Parasp Low Nml Nml Nml Nml Nml Nml Nml Nml Nml Nml Nml Nml N/A      Waveforms:

## 2016-02-26 ENCOUNTER — Encounter: Payer: Self-pay | Admitting: Family Medicine

## 2016-02-26 ENCOUNTER — Ambulatory Visit (INDEPENDENT_AMBULATORY_CARE_PROVIDER_SITE_OTHER): Payer: PPO | Admitting: Family Medicine

## 2016-02-26 VITALS — BP 120/65 | HR 67 | Temp 96.9°F | Ht 65.0 in | Wt 241.4 lb

## 2016-02-26 DIAGNOSIS — M5136 Other intervertebral disc degeneration, lumbar region: Secondary | ICD-10-CM

## 2016-02-26 DIAGNOSIS — I10 Essential (primary) hypertension: Secondary | ICD-10-CM | POA: Diagnosis not present

## 2016-02-26 DIAGNOSIS — G609 Hereditary and idiopathic neuropathy, unspecified: Secondary | ICD-10-CM | POA: Diagnosis not present

## 2016-02-26 LAB — BMP8+EGFR
BUN/Creatinine Ratio: 23 (ref 10–24)
BUN: 22 mg/dL (ref 8–27)
CO2: 28 mmol/L (ref 18–29)
CREATININE: 0.96 mg/dL (ref 0.76–1.27)
Calcium: 9.5 mg/dL (ref 8.6–10.2)
Chloride: 101 mmol/L (ref 96–106)
GFR calc Af Amer: 91 mL/min/{1.73_m2} (ref >59)
GFR, EST NON AFRICAN AMERICAN: 79 mL/min/{1.73_m2} (ref >59)
GLUCOSE: 107 mg/dL — AB (ref 65–99)
Potassium: 4.5 mmol/L (ref 3.5–5.2)
SODIUM: 142 mmol/L (ref 134–144)

## 2016-02-26 NOTE — Progress Notes (Signed)
Subjective:  Patient ID: Lonnie Hurley, male    DOB: 01/13/1941  Age: 75 y.o. MRN: [id number]  CC: 2 week recheck (pt was started on Lyrica 2 weeks ago for neuropathy and it is getting better)   HPI Lonnie Hurley presents for Right foot partially numb still, but stinging at left thigh is gone. Symptoms improved with no side effects of Lyrica noted. He is able to walk without imbalance.He denies falling.   History Lonnie Hurley has a past medical history of BPH (benign prostatic hypertrophy); Cancer ([place]); Cataract; Colon polyps; DDD (degenerative disc disease); Hearing loss; Hypercholesteremia; Hyperlipidemia; Hypertension; Leukoplakia; Microscopic hematuria; Thrombocytopenia (Lonnie Hurley); Tinnitus; Tobacco abuse; and Vitamin D deficiency.   He has a past surgical history that includes Left Knee Repair (11-10-2006); Hernia repair (1980); Nose surgery; Cataract extraction (2007); Knee surgery (02-28-2008); Colonoscopy; Polypectomy; Skin biopsy (10-08-2006); and Cataract extraction (Left, 03/21/15).   His family history includes Diabetes in his brother; Glaucoma in his mother; Heart disease in his brother; Heart failure in his father; Hyperlipidemia in his brother; Hypertension in his brother; Osteoporosis in his brother.He reports that he quit smoking about 6 years ago. His smoking use included Cigarettes. He quit after 50.00 years of use. He has never used smokeless tobacco. He reports that he does not drink alcohol or use drugs.    ROS Review of Systems  Constitutional: Negative for chills, diaphoresis and fever.  HENT: Negative for rhinorrhea and sore throat.   Respiratory: Negative for cough and shortness of breath.   Cardiovascular: Negative for chest pain.  Gastrointestinal: Negative for abdominal pain.  Musculoskeletal: Negative for arthralgias and myalgias.  Skin: Negative for rash.  Neurological: Positive for numbness. Negative for weakness and headaches.    Objective:  BP 120/65   Pulse  67   Temp (!) 96.9 F (36.1 C) (Oral)   Ht 5' 5"  (1.651 m)   Wt 241 lb 6 oz (109.5 kg)   BMI 40.17 kg/m   BP Readings from Last 3 Encounters:  02/26/16 120/65  02/12/16 121/71  01/21/16 140/80    Wt Readings from Last 3 Encounters:  02/26/16 241 lb 6 oz (109.5 kg)  02/12/16 240 lb (108.9 kg)  01/21/16 233 lb (105.7 kg)     Physical Exam  Constitutional: He appears well-developed and well-nourished.  HENT:  Head: Normocephalic and atraumatic.  Right Ear: Tympanic membrane and external ear normal. No decreased hearing is noted.  Left Ear: Tympanic membrane and external ear normal. No decreased hearing is noted.  Mouth/Throat: No oropharyngeal exudate or posterior oropharyngeal erythema.  Eyes: Pupils are equal, round, and reactive to light.  Neck: Normal range of motion. Neck supple.  Cardiovascular: Normal rate and regular rhythm.   No murmur heard. Pulmonary/Chest: Breath sounds normal. No respiratory distress.  Abdominal: Soft. Bowel sounds are normal. He exhibits no mass. There is no tenderness.  Vitals reviewed.    Lab Results  Component Value Date   WBC 9.0 12/14/2015   HGB 15.8 05/22/2014   HCT 41.1 12/14/2015   PLT 131 (L) 12/14/2015   GLUCOSE 78 12/14/2015   CHOL 120 12/14/2015   TRIG 167 (H) 12/14/2015   HDL 39 (L) 12/14/2015   LDLCALC 48 12/14/2015   ALT 19 12/14/2015   AST 23 12/14/2015   NA 144 12/14/2015   K 3.7 12/14/2015   CL 101 12/14/2015   CREATININE 0.98 12/14/2015   BUN 15 12/14/2015   CO2 29 12/14/2015   TSH 2.300 05/22/2014   PSA 1.0 06/23/2013  HGBA1C 5.9 06/15/2015    US Arterial Seg Single  Result Date: 02/15/2016 CLINICAL DATA:  Left thigh pain x6 months. Left foot tingling. Left infrapopliteal pain. EXAM: NONINVASIVE PHYSIOLOGIC VASCULAR STUDY OF BILATERAL LOWER EXTREMITIES TECHNIQUE: Evaluation of both lower extremities were performed at rest, including calculation of ankle-brachial indices with single level Doppler, pressure  and pulse volume recording. COMPARISON:  01/09/2014 FINDINGS: Right ABI:  1.25 Left ABI:  1.20 Right Lower Extremity: Triphasic waveforms in the distal posterior tibial. Left Lower Extremity:  Triphasic waveforms distally. IMPRESSION: No evidence of hemodynamically significant lower extremity arterial occlusive disease at rest. Electronically Signed   By: Lonnie Hurley M.D.   On: 02/15/2016 14:50    Assessment & Plan:   Lonnie Hurley was seen today for 2 week recheck.  Diagnoses and all orders for this visit:  DDD (degenerative disc disease), lumbar -     BMP8+EGFR  Hereditary and idiopathic peripheral neuropathy -     BMP8+EGFR  Essential hypertension -     BMP8+EGFR   I am having Lonnie Hurley maintain his tamsulosin, Vitamin D3, multivitamin with minerals, Fish Oil, CALCIUM-MAGNESIUM PO, glucosamine-chondroitin, nystatin-triamcinolone, ezetimibe, hydrochlorothiazide, simvastatin, niacin, ramipril, diclofenac sodium, and pregabalin.  No orders of the defined types were placed in this encounter.    Follow-up: Return in about 6 weeks (around 04/08/2016).  Lonnie Hurley, M.D.

## 2016-03-13 ENCOUNTER — Ambulatory Visit (INDEPENDENT_AMBULATORY_CARE_PROVIDER_SITE_OTHER): Payer: PPO

## 2016-03-13 DIAGNOSIS — Z23 Encounter for immunization: Secondary | ICD-10-CM | POA: Diagnosis not present

## 2016-03-14 ENCOUNTER — Ambulatory Visit: Payer: PPO

## 2016-04-01 ENCOUNTER — Ambulatory Visit (INDEPENDENT_AMBULATORY_CARE_PROVIDER_SITE_OTHER): Payer: PPO | Admitting: Family Medicine

## 2016-04-01 ENCOUNTER — Encounter: Payer: Self-pay | Admitting: Family Medicine

## 2016-04-01 VITALS — BP 119/61 | HR 65 | Temp 98.0°F | Ht 65.0 in | Wt 245.0 lb

## 2016-04-01 DIAGNOSIS — R7303 Prediabetes: Secondary | ICD-10-CM | POA: Diagnosis not present

## 2016-04-01 DIAGNOSIS — H04123 Dry eye syndrome of bilateral lacrimal glands: Secondary | ICD-10-CM | POA: Diagnosis not present

## 2016-04-01 DIAGNOSIS — G609 Hereditary and idiopathic neuropathy, unspecified: Secondary | ICD-10-CM | POA: Diagnosis not present

## 2016-04-01 DIAGNOSIS — I10 Essential (primary) hypertension: Secondary | ICD-10-CM

## 2016-04-01 DIAGNOSIS — H538 Other visual disturbances: Secondary | ICD-10-CM | POA: Diagnosis not present

## 2016-04-01 MED ORDER — PREGABALIN 100 MG PO CAPS: 100.0000 mg | ORAL_CAPSULE | Freq: Every evening | ORAL | 2 refills | Status: DC

## 2016-04-01 MED ORDER — CYCLOSPORINE 0.05 % OP EMUL: 1.0000 [drp] | Freq: Two times a day (BID) | OPHTHALMIC | 11 refills | Status: DC

## 2016-04-01 NOTE — Progress Notes (Signed)
Subjective:  Patient ID: Lonnie Hurley, male    DOB: 04/24/1941  Age: 75 y.o. MRN: [id number]  CC: medication follow up (pt here today for a follow up after starting Lyrica 6 weeks ago. Pt states he thinks it is working but has noticed some blurry vision.)   HPI Lonnie Hurley presents for Blurred vision since taking the Lyrica. Seems to have occurred when he started on the 150 mg dose but the timeline is very unclear. He says he also uses some drops for dry eyes and he has not been using those as regularly as directed. In fact much less than directed. He has had some subconjunctival hemorrhages well. With regard to the blurred vision he states that there is no problem with reading road signs or his instrument panel or regular newsprint. However sometimes he has to work hard to focus for fine print. Moreover he says the Lyrica is working quite well for his pain. He still has some in the right knee and in the instep of his right foot. It is tolerable, much less then prior to taking the medication.   History Lonnie Hurley has a past medical history of BPH (benign prostatic hypertrophy); Cancer ([place]); Cataract; Colon polyps; DDD (degenerative disc disease); Hearing loss; Hypercholesteremia; Hyperlipidemia; Hypertension; Leukoplakia; Microscopic hematuria; Thrombocytopenia ([place]); Tinnitus; Tobacco abuse; and Vitamin D deficiency.   He has a past surgical history that includes Left Knee Repair (11-10-2006); Hernia repair (1980); Nose surgery; Cataract extraction (2007); Knee surgery (02-28-2008); Colonoscopy; Polypectomy; Skin biopsy (10-08-2006); and Cataract extraction (Left, 03/21/15).   His family history includes Diabetes in his brother; Glaucoma in his mother; Heart disease in his brother; Heart failure in his father; Hyperlipidemia in his brother; Hypertension in his brother; Osteoporosis in his brother.He reports that he quit smoking about 6 years ago. His smoking use included Cigarettes. He quit after  50.00 years of use. He has never used smokeless tobacco. He reports that he does not drink alcohol or use drugs.    Medication List       Accurate as of 04/01/16  9:40 AM. Always use your most recent med list.          CALCIUM-MAGNESIUM PO Take 1 tablet by mouth daily.   cycloSPORINE 0.05 % ophthalmic emulsion Commonly known as:  RESTASIS Place 1 drop into both eyes 2 (two) times daily. For dry eyes   diclofenac sodium 1 % Gel Commonly known as:  VOLTAREN Apply 4 g topically 4 (four) times daily.   ezetimibe 10 MG tablet Commonly known as:  ZETIA Take 1 tablet (10 mg total) by mouth daily.   Fish Oil 1000 MG Caps Take 2 capsules by mouth daily. 1200   FLOMAX 0.4 MG Caps capsule Generic drug:  tamsulosin Take 0.4 mg by mouth at bedtime.   glucosamine-chondroitin 500-400 MG tablet Take 1 tablet by mouth 3 (three) times daily.   hydrochlorothiazide 12.5 MG tablet Commonly known as:  HYDRODIURIL Take 1 tablet (12.5 mg total) by mouth daily.   multivitamin with minerals tablet Take 1 tablet by mouth daily.   niacin 1000 MG CR tablet Commonly known as:  NIASPAN Take 1 tablet (1,000 mg total) by mouth at bedtime.   nystatin-triamcinolone cream Commonly known as:  MYCOLOG II Apply 1 application topically 2 (two) times daily.   pregabalin 100 MG capsule Commonly known as:  LYRICA Take 1 capsule (100 mg total) by mouth every evening.   ramipril 10 MG capsule Commonly known as:  ALTACE Take 1  capsule (10 mg total) by mouth daily.   simvastatin 40 MG tablet Commonly known as:  ZOCOR Take 1 tablet (40 mg total) by mouth at bedtime.   Vitamin D3 2000 units Tabs Take 1 tablet by mouth daily.        ROS Review of Systems  Constitutional: Negative for chills, diaphoresis and fever.  HENT: Negative for rhinorrhea and sore throat.   Eyes: Positive for visual disturbance.  Respiratory: Negative for cough and shortness of breath.   Cardiovascular: Negative for  chest pain.  Gastrointestinal: Negative for abdominal pain.  Musculoskeletal: Positive for arthralgias and myalgias.  Skin: Negative for rash.  Neurological: Negative for dizziness, weakness and headaches.    Objective:  BP 119/61   Pulse 65   Temp 98 F (36.7 C) (Oral)   Ht 5\' 5"  (1.651 m)   Wt 245 lb (111.1 kg)   BMI 40.77 kg/m   BP Readings from Last 3 Encounters:  04/01/16 119/61  02/26/16 120/65  02/12/16 121/71    Wt Readings from Last 3 Encounters:  04/01/16 245 lb (111.1 kg)  02/26/16 241 lb 6 oz (109.5 kg)  02/12/16 240 lb (108.9 kg)     Physical Exam  Constitutional: He is oriented to person, place, and time. He appears well-developed and well-nourished.  HENT:  Head: Normocephalic and atraumatic.  Right Ear: Tympanic membrane and external ear normal. No decreased hearing is noted.  Left Ear: Tympanic membrane and external ear normal. No decreased hearing is noted.  Mouth/Throat: No oropharyngeal exudate or posterior oropharyngeal erythema.  Eyes: EOM are normal. Pupils are equal, round, and reactive to light. Right eye exhibits no chemosis, no discharge and no hordeolum. Left eye exhibits no chemosis, no discharge and no hordeolum. Right conjunctiva is not injected. Right conjunctiva has no hemorrhage. Left conjunctiva is not injected. Left conjunctiva has no hemorrhage.  Neck: Normal range of motion. Neck supple.  Cardiovascular: Normal rate and regular rhythm.   No murmur heard. Pulmonary/Chest: Breath sounds normal. No respiratory distress.  Neurological: He is alert and oriented to person, place, and time.  Skin: Skin is warm and dry.  Vitals reviewed.    Lab Results  Component Value Date   WBC 9.0 12/14/2015   HGB 15.8 05/22/2014   HCT 41.1 12/14/2015   PLT 131 (L) 12/14/2015   GLUCOSE 107 (H) 02/26/2016   CHOL 120 12/14/2015   TRIG 167 (H) 12/14/2015   HDL 39 (L) 12/14/2015   LDLCALC 48 12/14/2015   ALT 19 12/14/2015   AST 23 12/14/2015    NA 142 02/26/2016   K 4.5 02/26/2016   CL 101 02/26/2016   CREATININE 0.96 02/26/2016   BUN 22 02/26/2016   CO2 28 02/26/2016   TSH 2.300 05/22/2014   PSA 1.0 06/23/2013   HGBA1C 5.9 06/15/2015    US Arterial Seg Single  Result Date: 02/15/2016 CLINICAL DATA:  Left thigh pain x6 months. Left foot tingling. Left infrapopliteal pain. EXAM: NONINVASIVE PHYSIOLOGIC VASCULAR STUDY OF BILATERAL LOWER EXTREMITIES TECHNIQUE: Evaluation of both lower extremities were performed at rest, including calculation of ankle-brachial indices with single level Doppler, pressure and pulse volume recording. COMPARISON:  01/09/2014 FINDINGS: Right ABI:  1.25 Left ABI:  1.20 Right Lower Extremity: Triphasic waveforms in the distal posterior tibial. Left Lower Extremity:  Triphasic waveforms distally. IMPRESSION: No evidence of hemodynamically significant lower extremity arterial occlusive disease at rest. Electronically Signed   By: Lucrezia Europe M.D.   On: 02/15/2016 14:50  Assessment & Plan:   Lonnie Hurley was seen today for medication follow up.  Diagnoses and all orders for this visit:  Essential hypertension  Pre-diabetes  Hereditary and idiopathic peripheral neuropathy  Blurred vision, bilateral  Chronically dry eyes, bilateral  Other orders -     pregabalin (LYRICA) 100 MG capsule; Take 1 capsule (100 mg total) by mouth every evening. -     cycloSPORINE (RESTASIS) 0.05 % ophthalmic emulsion; Place 1 drop into both eyes 2 (two) times daily. For dry eyes    Mild blurred vision is likely due to a combination of the Lyrica and the dry eyes. He will recommit to using his eyedrops as well as decreasing his dose of Lyrica.  I have changed Lonnie Hurley's pregabalin. I am also having him start on cycloSPORINE. Additionally, I am having him maintain his tamsulosin, Vitamin D3, multivitamin with minerals, Fish Oil, CALCIUM-MAGNESIUM PO, glucosamine-chondroitin, nystatin-triamcinolone, ezetimibe,  hydrochlorothiazide, simvastatin, niacin, ramipril, and diclofenac sodium.  Meds ordered this encounter  Medications  . pregabalin (LYRICA) 100 MG capsule    Sig: Take 1 capsule (100 mg total) by mouth every evening.    Dispense:  30 capsule    Refill:  2  . cycloSPORINE (RESTASIS) 0.05 % ophthalmic emulsion    Sig: Place 1 drop into both eyes 2 (two) times daily. For dry eyes    Dispense:  5.5 mL    Refill:  11     Follow-up: Return in about 1 month (around 05/02/2016).  Claretta Fraise, M.D.

## 2016-04-25 DIAGNOSIS — H16223 Keratoconjunctivitis sicca, not specified as Sjogren's, bilateral: Secondary | ICD-10-CM | POA: Diagnosis not present

## 2016-04-25 DIAGNOSIS — E119 Type 2 diabetes mellitus without complications: Secondary | ICD-10-CM | POA: Diagnosis not present

## 2016-04-25 DIAGNOSIS — Z961 Presence of intraocular lens: Secondary | ICD-10-CM | POA: Diagnosis not present

## 2016-05-05 ENCOUNTER — Encounter: Payer: Self-pay | Admitting: Family Medicine

## 2016-05-05 ENCOUNTER — Ambulatory Visit (INDEPENDENT_AMBULATORY_CARE_PROVIDER_SITE_OTHER): Payer: PPO | Admitting: Family Medicine

## 2016-05-05 VITALS — BP 129/67 | HR 74 | Temp 97.2°F | Ht 65.0 in | Wt 250.0 lb

## 2016-05-05 DIAGNOSIS — H04123 Dry eye syndrome of bilateral lacrimal glands: Secondary | ICD-10-CM

## 2016-05-05 DIAGNOSIS — M5136 Other intervertebral disc degeneration, lumbar region: Secondary | ICD-10-CM | POA: Diagnosis not present

## 2016-05-05 DIAGNOSIS — H538 Other visual disturbances: Secondary | ICD-10-CM | POA: Diagnosis not present

## 2016-05-05 DIAGNOSIS — I10 Essential (primary) hypertension: Secondary | ICD-10-CM | POA: Diagnosis not present

## 2016-05-05 NOTE — Progress Notes (Signed)
Subjective:  Patient ID: Lonnie Hurley, male    DOB: 1940/11/01  Age: 75 y.o. MRN: [id number]  CC: Peripheral Neuropathy (pt here today following up on the neuropathy, pt saw the eye doctor and his vision hasn't changed)   HPI Lonnie Hurley presents for  follow-up of hypertension. Patient has no history of headache chest pain or shortness of breath or recent cough. Patient also denies symptoms of TIA such as numbness weakness lateralizing. Patient checks  blood pressure at home and has not had any elevated readings recently. Patient denies side effects from medication. States taking it regularly.  Also in for follow-up of his back pain. We had to decrease his dose of Lyrica last month due to blurred vision. Today he states that Instead he went back to the gabapentin 300 mg once daily. the blurred vision is Much better and the pain is under good control. He still notices some numbness and tingling on the right below the knee. He also has some aching on the left. Both are manageable at this time. He says the Restasis was too expensive and he wants to try an over-the-counter medicine recommended by his eye doctor. It is called Soothe. He states that his eye doctor gave him a good report. He just needed some new lenses in his glasses due to excessive scratches.   History Lonnie Hurley has a past medical history of BPH (benign prostatic hypertrophy); Cancer (Lonnie Hurley); Cataract; Colon polyps; DDD (degenerative disc disease); Hearing loss; Hypercholesteremia; Hyperlipidemia; Hypertension; Leukoplakia; Microscopic hematuria; Thrombocytopenia (Lonnie Hurley); Tinnitus; Tobacco abuse; and Vitamin D deficiency.   He has a past surgical history that includes Left Knee Repair (11-10-2006); Hernia repair (1980); Nose surgery; Cataract extraction (2007); Knee surgery (02-28-2008); Colonoscopy; Polypectomy; Skin biopsy (10-08-2006); and Cataract extraction (Left, 03/21/15).   His family history includes Diabetes in his brother;  Glaucoma in his mother; Heart disease in his brother; Heart failure in his father; Hyperlipidemia in his brother; Hypertension in his brother; Osteoporosis in his brother.He reports that he quit smoking about 6 years ago. His smoking use included Cigarettes. He quit after 50.00 years of use. He has never used smokeless tobacco. He reports that he does not drink alcohol or use drugs.  Current Outpatient Prescriptions on File Prior to Visit  Medication Sig Dispense Refill  . CALCIUM-MAGNESIUM PO Take 1 tablet by mouth daily.    . Cholecalciferol (VITAMIN D3) 2000 UNITS TABS Take 1 tablet by mouth daily.     . cycloSPORINE (RESTASIS) 0.05 % ophthalmic emulsion Place 1 drop into both eyes 2 (two) times daily. For dry eyes 5.5 mL 11  . diclofenac sodium (VOLTAREN) 1 % GEL Apply 4 g topically 4 (four) times daily. 100 g 0  . ezetimibe (ZETIA) 10 MG tablet Take 1 tablet (10 mg total) by mouth daily. 90 tablet 3  . glucosamine-chondroitin 500-400 MG tablet Take 1 tablet by mouth 3 (three) times daily.    . hydrochlorothiazide (HYDRODIURIL) 12.5 MG tablet Take 1 tablet (12.5 mg total) by mouth daily. 90 tablet 4  . Multiple Vitamins-Minerals (MULTIVITAMIN WITH MINERALS) tablet Take 1 tablet by mouth daily.    . niacin (NIASPAN) 1000 MG CR tablet Take 1 tablet (1,000 mg total) by mouth at bedtime. 90 tablet 3  . nystatin-triamcinolone (MYCOLOG II) cream Apply 1 application topically 2 (two) times daily. 30 g 0  . Omega-3 Fatty Acids (FISH OIL) 1000 MG CAPS Take 2 capsules by mouth daily. 1200    . ramipril (ALTACE) 10 MG capsule  Take 1 capsule (10 mg total) by mouth daily. 90 capsule 3  . simvastatin (ZOCOR) 40 MG tablet Take 1 tablet (40 mg total) by mouth at bedtime. 90 tablet 4  . Tamsulosin HCl (FLOMAX) 0.4 MG CAPS Take 0.4 mg by mouth at bedtime.      No current facility-administered medications on file prior to visit.     ROS Review of Systems  Constitutional: Negative for chills, diaphoresis,  fever and unexpected weight change.  HENT: Negative for congestion, hearing loss, rhinorrhea and sore throat.   Eyes: Positive for pain and visual disturbance.  Respiratory: Negative for cough and shortness of breath.   Cardiovascular: Negative for chest pain.  Gastrointestinal: Negative for abdominal pain, constipation and diarrhea.  Genitourinary: Negative for dysuria and flank pain.  Musculoskeletal: Positive for arthralgias and myalgias. Negative for joint swelling.  Skin: Negative for rash.  Neurological: Negative for dizziness and headaches.  Psychiatric/Behavioral: Negative for dysphoric mood and sleep disturbance.    Objective:  BP 129/67   Pulse 74   Temp 97.2 F (36.2 C) (Oral)   Ht 5\' 5"  (1.651 m)   Wt 250 lb (113.4 kg)   BMI 41.60 kg/m   BP Readings from Last 3 Encounters:  05/05/16 129/67  04/01/16 119/61  02/26/16 120/65    Wt Readings from Last 3 Encounters:  05/05/16 250 lb (113.4 kg)  04/01/16 245 lb (111.1 kg)  02/26/16 241 lb 6 oz (109.5 kg)     Physical Exam  Constitutional: He is oriented to person, place, and time. He appears well-developed and well-nourished. No distress.  HENT:  Head: Normocephalic and atraumatic.  Right Ear: External ear normal.  Left Ear: External ear normal.  Nose: Nose normal.  Mouth/Throat: Oropharynx is clear and moist.  Eyes: Conjunctivae and EOM are normal. Pupils are equal, round, and reactive to light.  Neck: Normal range of motion. Neck supple. No thyromegaly present.  Pulmonary/Chest: Effort normal. No respiratory distress.  Abdominal: Soft.  Lymphadenopathy:    He has no cervical adenopathy.  Neurological: He is alert and oriented to person, place, and time. He has normal reflexes.  Skin: Skin is warm and dry.  Psychiatric: He has a normal mood and affect. His behavior is normal. Judgment and thought content normal.     Lab Results  Component Value Date   WBC 9.0 12/14/2015   HGB 15.8 05/22/2014   HCT  41.1 12/14/2015   PLT 131 (L) 12/14/2015   GLUCOSE 107 (H) 02/26/2016   CHOL 120 12/14/2015   TRIG 167 (H) 12/14/2015   HDL 39 (L) 12/14/2015   LDLCALC 48 12/14/2015   ALT 19 12/14/2015   AST 23 12/14/2015   NA 142 02/26/2016   K 4.5 02/26/2016   CL 101 02/26/2016   CREATININE 0.96 02/26/2016   BUN 22 02/26/2016   CO2 28 02/26/2016   TSH 2.300 05/22/2014   PSA 1.0 06/23/2013   HGBA1C 5.9 06/15/2015    US Arterial Seg Single  Result Date: 02/15/2016 CLINICAL DATA:  Left thigh pain x6 months. Left foot tingling. Left infrapopliteal pain. EXAM: NONINVASIVE PHYSIOLOGIC VASCULAR STUDY OF BILATERAL LOWER EXTREMITIES TECHNIQUE: Evaluation of both lower extremities were performed at rest, including calculation of ankle-brachial indices with single level Doppler, pressure and pulse volume recording. COMPARISON:  01/09/2014 FINDINGS: Right ABI:  1.25 Left ABI:  1.20 Right Lower Extremity: Triphasic waveforms in the distal posterior tibial. Left Lower Extremity:  Triphasic waveforms distally. IMPRESSION: No evidence of hemodynamically significant lower extremity  arterial occlusive disease at rest. Electronically Signed   By: Lucrezia Europe M.D.   On: 02/15/2016 14:50    Assessment & Plan:   Sylvin was seen today for peripheral neuropathy.  Diagnoses and all orders for this visit:  Essential hypertension  DDD (degenerative disc disease), lumbar  Chronically dry eyes, bilateral  Blurred vision, bilateral   I have discontinued Mr. Steve's pregabalin. I am also having him maintain his tamsulosin, Vitamin D3, multivitamin with minerals, Fish Oil, CALCIUM-MAGNESIUM PO, glucosamine-chondroitin, nystatin-triamcinolone, ezetimibe, hydrochlorothiazide, simvastatin, niacin, ramipril, diclofenac sodium, cycloSPORINE, and gabapentin.  Meds ordered this encounter  Medications  . gabapentin (NEURONTIN) 300 MG capsule    Sig: Take 300 mg by mouth daily.      Follow-up: Return in about 3 months  (around 08/05/2016) for hypertension, neurpathy, prediabetes.  Claretta Fraise, M.D.

## 2016-05-17 ENCOUNTER — Encounter: Payer: Self-pay | Admitting: Family Medicine

## 2016-05-17 ENCOUNTER — Ambulatory Visit (INDEPENDENT_AMBULATORY_CARE_PROVIDER_SITE_OTHER): Payer: PPO | Admitting: Family Medicine

## 2016-05-17 VITALS — BP 117/69 | HR 81 | Temp 97.2°F | Ht 65.0 in | Wt 247.0 lb

## 2016-05-17 DIAGNOSIS — J209 Acute bronchitis, unspecified: Secondary | ICD-10-CM | POA: Diagnosis not present

## 2016-05-17 MED ORDER — AZITHROMYCIN 250 MG PO TABS: ORAL_TABLET | ORAL | 0 refills | Status: DC

## 2016-05-17 NOTE — Progress Notes (Signed)
   HPI  Patient presents today here with cough.  Patient explains that over the last 7 days he's had cough productive of yellow sputum, sinus congestion, and malaise. He's had subjective fever but no measured fevers. He is tolerating foods and fluids normally.  He denies any chest pain, sore throat, or severe dyspnea.  He also requests results from a recent EMG.  He has been taking Mucinex with some increase in productive cough.   PMH: Smoking status noted ROS: Per HPI  Objective: BP 117/69   Pulse 81   Temp 97.2 F (36.2 C) (Oral)   Ht 5\' 5"  (1.651 m)   Wt 247 lb (112 kg)   BMI 41.10 kg/m  Gen: NAD, alert, cooperative with exam HEENT: NCAT, TMs normal bilaterally, oropharynx moist and clear, nares with swelling of the turbinates bilaterally CV: RRR, good S1/S2, no murmur Resp: CTABL, no wheezes, non-labored Ext: No edema, warm Neuro: Alert and oriented, No gross deficits  Assessment and plan:  # Acute bronchitis Possible developing pneumonia given malaise and productive cough. Considering subjective fever I have covered him with azithromycin. Continue supportive care Low threshold for return   EMG results printed out and reviewed- Note that PCP has discussed with pt and ordered f/u MRI. Pt keeps a detailed binder with hard copies    Meds ordered this encounter  Medications  . fluticasone (FLONASE) 50 MCG/ACT nasal spray    Sig: Place 2 sprays into both nostrils daily.    Laroy Apple, MD [facility] 05/17/2016, 9:00 AM

## 2016-05-17 NOTE — Patient Instructions (Signed)
Great to see you!  Finish all antibiotics   Acute Bronchitis, Adult Acute bronchitis is when air tubes (bronchi) in the lungs suddenly get swollen. The condition can make it hard to breathe. It can also cause these symptoms:  A cough.  Coughing up clear, yellow, or green mucus.  Wheezing.  Chest congestion.  Shortness of breath.  A fever.  Body aches.  Chills.  A sore throat. Follow these instructions at home: Medicines  Take over-the-counter and prescription medicines only as told by your doctor.  If you were prescribed an antibiotic medicine, take it as told by your doctor. Do not stop taking the antibiotic even if you start to feel better. General instructions  Rest.  Drink enough fluids to keep your pee (urine) clear or pale yellow.  Avoid smoking and secondhand smoke. If you smoke and you need help quitting, ask your doctor. Quitting will help your lungs heal faster.  Use an inhaler, cool mist vaporizer, or humidifier as told by your doctor.  Keep all follow-up visits as told by your doctor. This is important. How is this prevented? To lower your risk of getting this condition again:  Wash your hands often with soap and water. If you cannot use soap and water, use hand sanitizer.  Avoid contact with people who have cold symptoms.  Try not to touch your hands to your mouth, nose, or eyes.  Make sure to get the flu shot every year. Contact a doctor if:  Your symptoms do not get better in 2 weeks. Get help right away if:  You cough up blood.  You have chest pain.  You have very bad shortness of breath.  You become dehydrated.  You faint (pass out) or keep feeling like you are going to pass out.  You keep throwing up (vomiting).  You have a very bad headache.  Your fever or chills gets worse. This information is not intended to replace advice given to you by your health care provider. Make sure you discuss any questions you have with your  health care provider. Document Released: 11/19/2007 Document Revised: 01/09/2016 Document Reviewed: 11/21/2015 Elsevier Interactive Patient Education  2017 [employer].

## 2016-05-30 ENCOUNTER — Other Ambulatory Visit: Payer: Self-pay | Admitting: Pharmacist

## 2016-05-30 NOTE — Patient Outreach (Signed)
Outreach call to [employer] regarding his request for follow up from the [facility] Medication Adherence Campaign. The person answering the phone asks that I call back today after 12pm. Will try patient back at that time.  Call patient back as requested. Phone rings, but no one answers and no voicemail picks up.  Harlow Asa, PharmD, [facility] [phone]

## 2016-06-13 ENCOUNTER — Other Ambulatory Visit: Payer: Self-pay | Admitting: Family Medicine

## 2016-06-13 DIAGNOSIS — G609 Hereditary and idiopathic neuropathy, unspecified: Secondary | ICD-10-CM

## 2016-06-17 ENCOUNTER — Ambulatory Visit: Payer: PPO | Admitting: Family Medicine

## 2016-06-18 ENCOUNTER — Ambulatory Visit (INDEPENDENT_AMBULATORY_CARE_PROVIDER_SITE_OTHER): Payer: PPO | Admitting: Family Medicine

## 2016-06-18 ENCOUNTER — Encounter: Payer: Self-pay | Admitting: Family Medicine

## 2016-06-18 VITALS — BP 122/71 | HR 78 | Temp 98.5°F | Ht 65.0 in | Wt 254.4 lb

## 2016-06-18 DIAGNOSIS — E785 Hyperlipidemia, unspecified: Secondary | ICD-10-CM

## 2016-06-18 DIAGNOSIS — I10 Essential (primary) hypertension: Secondary | ICD-10-CM

## 2016-06-18 DIAGNOSIS — M5442 Lumbago with sciatica, left side: Secondary | ICD-10-CM | POA: Diagnosis not present

## 2016-06-18 DIAGNOSIS — R7303 Prediabetes: Secondary | ICD-10-CM | POA: Diagnosis not present

## 2016-06-18 LAB — BAYER DCA HB A1C WAIVED: HB A1C (BAYER DCA - WAIVED): 6.2 % (ref <7.0)

## 2016-06-18 MED ORDER — EZETIMIBE 10 MG PO TABS: 10.0000 mg | ORAL_TABLET | Freq: Every day | ORAL | 3 refills | Status: DC

## 2016-06-18 MED ORDER — NIACIN ER (ANTIHYPERLIPIDEMIC) 1000 MG PO TBCR: 1000.0000 mg | EXTENDED_RELEASE_TABLET | Freq: Every day | ORAL | 3 refills | Status: DC

## 2016-06-18 MED ORDER — SIMVASTATIN 40 MG PO TABS
40.0000 mg | ORAL_TABLET | Freq: Every day | ORAL | 3 refills | Status: DC
Start: 2016-06-18 — End: 2017-06-25

## 2016-06-18 MED ORDER — HYDROCHLOROTHIAZIDE 12.5 MG PO TABS: 12.5000 mg | ORAL_TABLET | Freq: Every day | ORAL | 3 refills | Status: DC

## 2016-06-18 MED ORDER — PREDNISONE 20 MG PO TABS: 40.0000 mg | ORAL_TABLET | Freq: Every day | ORAL | 0 refills | Status: DC

## 2016-06-18 NOTE — Patient Instructions (Addendum)
Great to see you!  Try the prednisone for your back pain, it is only a short course wfor when the pain is bad.   Normally you can take gabapentin 1 pill three times daily  We will call with labs in week

## 2016-06-18 NOTE — Progress Notes (Signed)
   HPI  Patient presents today here for follow-up chronic medical conditions and left hip pain.  Hyperlipidemia Good medication compliance, needs refill  Prediabetes States that he has had increased urination lately, request A1c rechecked today.  Hypertension No chest pain, good medication compliance. Needs refills.  Left-sided low back pain Patient reports 5+ year history of low back pain. Patient states over the last few weeks it's been much worse, he took gabapentin 3 times daily, Tylenol 3 times daily, and some intermittent ibuprofen and has had some improvement. He denies any recent injury. He has had nerve conduction study and an MRI has been ordered, although not arranged.  PMH: Smoking status noted ROS: Per HPI  Objective: BP 122/71   Pulse 78   Temp 98.5 F (36.9 C) (Oral)   Ht 5' 5" (1.651 m)   Wt 254 lb 6.4 oz (115.4 kg)   BMI 42.33 kg/m  Gen: NAD, alert, cooperative with exam HEENT: NCAT CV: RRR, good S1/S2, no murmur Resp: CTABL, no wheezes, non-labored Ext: No edema, warm Neuro: Alert and oriented, left lower extremity 4/5 strength compared to 5/5 strength on the right, 2+ patellar tendon reflexes bilaterally, gait with a left-sided limp  Assessment and plan:  # Hypertension Well-controlled on HCTZ and ramipril, continue Labs ordered  # Hyperlipidemia Previously with Crestor myalgias, tolerating simvastatin and Zetia, refilled Labs  # Prediabetes Also with obesity, repeat A1c Suspect stable results  # Acute left-sided low back pain Acute on chronic low back pain Previous nerve conduction study shows radiculopathy, follow-up on MRI. Short course of prednisone to help with inflammation Reviewed appropriate dosing of Tylenol and gabapentin, recommended avoiding NSAIDs in his case    Orders Placed This Encounter  Procedures  . Lipid panel  . CMP14+EGFR  . CBC with Differential/Platelet  . Bayer DCA Hb A1c Waived    Meds ordered this  encounter  Medications  . ezetimibe (ZETIA) 10 MG tablet    Sig: Take 1 tablet (10 mg total) by mouth daily.    Dispense:  90 tablet    Refill:  3  . niacin (NIASPAN) 1000 MG CR tablet    Sig: Take 1 tablet (1,000 mg total) by mouth at bedtime.    Dispense:  90 tablet    Refill:  3  . simvastatin (ZOCOR) 40 MG tablet    Sig: Take 1 tablet (40 mg total) by mouth at bedtime.    Dispense:  90 tablet    Refill:  3  . hydrochlorothiazide (HYDRODIURIL) 12.5 MG tablet    Sig: Take 1 tablet (12.5 mg total) by mouth daily.    Dispense:  90 tablet    Refill:  3  . predniSONE (DELTASONE) 20 MG tablet    Sig: Take 2 tablets (40 mg total) by mouth daily with breakfast.    Dispense:  10 tablet    Refill:  0    Laroy Apple, MD [facility] 06/18/2016, 5:18 PM

## 2016-06-19 LAB — LIPID PANEL
CHOL/HDL RATIO: 3.1 ratio (ref 0.0–5.0)
Cholesterol, Total: 128 mg/dL (ref 100–199)
HDL: 41 mg/dL (ref >39)
LDL Calculated: 40 mg/dL (ref 0–99)
Triglycerides: 234 mg/dL — ABNORMAL HIGH (ref 0–149)
VLDL Cholesterol Cal: 47 mg/dL — ABNORMAL HIGH (ref 5–40)

## 2016-06-19 LAB — CBC WITH DIFFERENTIAL/PLATELET
BASOS ABS: 0.1 10*3/uL (ref 0.0–0.2)
Basos: 1 %
EOS (ABSOLUTE): 0.5 10*3/uL — ABNORMAL HIGH (ref 0.0–0.4)
Eos: 5 %
HEMOGLOBIN: 14.6 g/dL (ref 13.0–17.7)
Hematocrit: 44.2 % (ref 37.5–51.0)
IMMATURE GRANS (ABS): 0 10*3/uL (ref 0.0–0.1)
Immature Granulocytes: 0 %
LYMPHS: 35 %
Lymphocytes Absolute: 3.7 10*3/uL — ABNORMAL HIGH (ref 0.7–3.1)
MCH: 29.7 pg (ref 26.6–33.0)
MCHC: 33 g/dL (ref 31.5–35.7)
MCV: 90 fL (ref 79–97)
MONOCYTES: 14 %
Monocytes Absolute: 1.4 10*3/uL — ABNORMAL HIGH (ref 0.1–0.9)
Neutrophils Absolute: 4.8 10*3/uL (ref 1.4–7.0)
Neutrophils: 45 %
PLATELETS: 169 10*3/uL (ref 150–379)
RBC: 4.91 x10E6/uL (ref 4.14–5.80)
RDW: 13.6 % (ref 12.3–15.4)
WBC: 10.4 10*3/uL (ref 3.4–10.8)

## 2016-06-19 LAB — CMP14+EGFR
ALBUMIN: 4.2 g/dL (ref 3.5–4.8)
ALT: 26 IU/L (ref 0–44)
AST: 27 IU/L (ref 0–40)
Albumin/Globulin Ratio: 1.6 (ref 1.2–2.2)
Alkaline Phosphatase: 47 IU/L (ref 39–117)
BUN / CREAT RATIO: 18 (ref 10–24)
BUN: 15 mg/dL (ref 8–27)
Bilirubin Total: 0.5 mg/dL (ref 0.0–1.2)
CALCIUM: 9.8 mg/dL (ref 8.6–10.2)
CHLORIDE: 101 mmol/L (ref 96–106)
CO2: 27 mmol/L (ref 18–29)
CREATININE: 0.84 mg/dL (ref 0.76–1.27)
GFR, EST AFRICAN AMERICAN: 100 mL/min/{1.73_m2} (ref >59)
GFR, EST NON AFRICAN AMERICAN: 87 mL/min/{1.73_m2} (ref >59)
GLUCOSE: 94 mg/dL (ref 65–99)
Globulin, Total: 2.6 g/dL (ref 1.5–4.5)
Potassium: 4.1 mmol/L (ref 3.5–5.2)
Sodium: 144 mmol/L (ref 134–144)
TOTAL PROTEIN: 6.8 g/dL (ref 6.0–8.5)

## 2016-06-23 ENCOUNTER — Other Ambulatory Visit: Payer: PPO

## 2016-06-23 ENCOUNTER — Other Ambulatory Visit: Payer: Self-pay | Admitting: *Deleted

## 2016-06-23 DIAGNOSIS — K921 Melena: Secondary | ICD-10-CM | POA: Diagnosis not present

## 2016-06-24 ENCOUNTER — Ambulatory Visit (INDEPENDENT_AMBULATORY_CARE_PROVIDER_SITE_OTHER): Payer: PPO | Admitting: Family Medicine

## 2016-06-24 ENCOUNTER — Encounter: Payer: Self-pay | Admitting: Family Medicine

## 2016-06-24 VITALS — BP 139/83 | HR 87 | Temp 96.9°F | Ht 65.0 in | Wt 252.8 lb

## 2016-06-24 DIAGNOSIS — K921 Melena: Secondary | ICD-10-CM | POA: Diagnosis not present

## 2016-06-24 DIAGNOSIS — M5442 Lumbago with sciatica, left side: Secondary | ICD-10-CM

## 2016-06-24 DIAGNOSIS — G8929 Other chronic pain: Secondary | ICD-10-CM | POA: Diagnosis not present

## 2016-06-24 LAB — CBC WITH DIFFERENTIAL/PLATELET
BASOS: 0 %
Basophils Absolute: 0 10*3/uL (ref 0.0–0.2)
EOS (ABSOLUTE): 0 10*3/uL (ref 0.0–0.4)
Eos: 0 %
HEMATOCRIT: 43.5 % (ref 37.5–51.0)
HEMOGLOBIN: 14.9 g/dL (ref 13.0–17.7)
IMMATURE GRANS (ABS): 0.1 10*3/uL (ref 0.0–0.1)
Immature Granulocytes: 1 %
LYMPHS: 19 %
Lymphocytes Absolute: 1.9 10*3/uL (ref 0.7–3.1)
MCH: 29.9 pg (ref 26.6–33.0)
MCHC: 34.3 g/dL (ref 31.5–35.7)
MCV: 87 fL (ref 79–97)
MONOCYTES: 4 %
Monocytes Absolute: 0.4 10*3/uL (ref 0.1–0.9)
Neutrophils Absolute: 7.8 10*3/uL — ABNORMAL HIGH (ref 1.4–7.0)
Neutrophils: 76 %
Platelets: 185 10*3/uL (ref 150–379)
RBC: 4.99 x10E6/uL (ref 4.14–5.80)
RDW: 14 % (ref 12.3–15.4)
WBC: 10.3 10*3/uL (ref 3.4–10.8)

## 2016-06-24 NOTE — Patient Instructions (Signed)
Great to see you!  Please let us know if you have any additional bloody stools. If you have more than 3 in a day or they are unusaually large please get medical help right away.   If you have any abdominal pain or fevers, please get help.

## 2016-06-24 NOTE — Progress Notes (Signed)
   HPI  Patient presents today here with bloody stools.  Patient states that over the last 3 days he's had 3 different episodes of bright red blood per rectum. He describes stool covered in blood with some blood present in the water of the toilet as well. He denies any abdominal pain or rectal pain. He states that this started 2 days after starting prednisone which was being used for radicular left leg pain.  The pain was not felt very well by prednisone. He has finished prednisone and today he has had a normal bowel movement. He had a total of 3 bowel movements overall.  He had a colonoscopy 3 years ago and had diverticulosis at that time. He does not have any signs or symptoms of anal fissure or hemorrhoids.  Overall patient feels well. A CBC drawn yesterday which is stable from labs one week ago.  Back pain Severe left-sided low back pain with more severe leg pain. She also feels that his left leg is weak. He started gabapentin, however he's hesitant to titrate the medication. He has taken it 3 times a day without somnolence with some improvement of pain, however he is now taking it twice daily. It is helping. Voltaren gel also helps. Prednisone did not help much. Pain worsens with walking. No bowel or bladder dysfunction   PMH: Smoking status noted ROS: Per HPI  Objective: BP 139/83   Pulse 87   Temp (!) 96.9 F (36.1 C) (Oral)   Ht 5\' 5"  (1.651 m)   Wt 252 lb 12.8 oz (114.7 kg)   BMI 42.07 kg/m  Gen: NAD, alert, cooperative with exam HEENT: NCAT CV: RRR, good S1/S2, no murmur Resp: CTABL, no wheezes, non-labored Ext: No edema, warm Neuro: Alert and oriented, 5/5 and sensation intact in bilateral lower extremities MSK Tenderness to palpation left-sided low back paraspinal muscles, no midline tenderness  Assessment and plan:  # Bloody stools Resolved, transient bleeding diverticula versus internal hemorrhoid. CBC is stable. Very low threshold for GI follow-up, however  no signs or symptoms of serious GI bleed  # Chronic Right low back pain Continued, slowly worsening. Previous nerve conduction study shows radiculopathy, MRI ordered Followed up on MRI this morning, if unable to get this approved I would recommend orthopedic surgery referral    Laroy Apple, MD [facility] 06/24/2016, 12:29 PM

## 2016-07-08 ENCOUNTER — Ambulatory Visit (HOSPITAL_COMMUNITY): Payer: PPO

## 2016-07-10 DIAGNOSIS — N401 Enlarged prostate with lower urinary tract symptoms: Secondary | ICD-10-CM | POA: Diagnosis not present

## 2016-07-10 DIAGNOSIS — N3941 Urge incontinence: Secondary | ICD-10-CM | POA: Diagnosis not present

## 2016-07-10 DIAGNOSIS — R351 Nocturia: Secondary | ICD-10-CM | POA: Diagnosis not present

## 2016-07-11 ENCOUNTER — Ambulatory Visit (HOSPITAL_COMMUNITY)
Admission: RE | Admit: 2016-07-11 | Discharge: 2016-07-11 | Disposition: A | Discharge status: Home / Self Care | Payer: PPO | Source: Ambulatory Visit | Attending: Family Medicine | Admitting: Family Medicine

## 2016-07-11 DIAGNOSIS — M48061 Spinal stenosis, lumbar region without neurogenic claudication: Secondary | ICD-10-CM | POA: Diagnosis not present

## 2016-07-11 DIAGNOSIS — M5116 Intervertebral disc disorders with radiculopathy, lumbar region: Secondary | ICD-10-CM | POA: Insufficient documentation

## 2016-07-11 DIAGNOSIS — M5416 Radiculopathy, lumbar region: Secondary | ICD-10-CM

## 2016-07-11 DIAGNOSIS — M545 Low back pain: Secondary | ICD-10-CM | POA: Diagnosis not present

## 2016-07-13 ENCOUNTER — Encounter: Payer: Self-pay | Admitting: Family Medicine

## 2016-07-13 ENCOUNTER — Other Ambulatory Visit: Payer: Self-pay | Admitting: Family Medicine

## 2016-07-13 DIAGNOSIS — I714 Abdominal aortic aneurysm, without rupture, unspecified: Secondary | ICD-10-CM | POA: Insufficient documentation

## 2016-07-13 DIAGNOSIS — M4716 Other spondylosis with myelopathy, lumbar region: Secondary | ICD-10-CM

## 2016-07-15 ENCOUNTER — Ambulatory Visit (INDEPENDENT_AMBULATORY_CARE_PROVIDER_SITE_OTHER): Payer: PPO | Admitting: Family Medicine

## 2016-07-15 ENCOUNTER — Encounter: Payer: Self-pay | Admitting: Family Medicine

## 2016-07-15 VITALS — BP 137/69 | HR 76 | Temp 96.7°F | Ht 65.0 in | Wt 255.0 lb

## 2016-07-15 DIAGNOSIS — I714 Abdominal aortic aneurysm, without rupture, unspecified: Secondary | ICD-10-CM

## 2016-07-15 DIAGNOSIS — M5416 Radiculopathy, lumbar region: Secondary | ICD-10-CM | POA: Diagnosis not present

## 2016-07-15 NOTE — Patient Instructions (Signed)
Great to see you!  We will work on a referral for a back Psychologist, sport and exercise for you.

## 2016-07-15 NOTE — Progress Notes (Signed)
   HPI  Patient presents today here to discuss MRI results.  Patient has been struggling with left-sided low back pain with radiculopathy for several months. He was found to have several issues on MRI, most significantly L5 nerve root compression from a bulging disc. His symptoms are stable to slightly improved. His symptoms are mostly left-sided with some right-footed numbness.  We also discussed his aortic aneurysm and repeat ultrasound in 3 years per the radiology recommendation. He would like to see a neurosurgeon to discuss his MRI results.  PMH: Smoking status noted ROS: Per HPI  Objective: BP 137/69   Pulse 76   Temp (!) 96.7 F (35.9 C) (Oral)   Ht 5\' 5"  (1.651 m)   Wt 255 lb (115.7 kg)   BMI 42.43 kg/m  Gen: NAD, alert, cooperative with exam HEENT: NCAT CV: RRR, good S1/S2, no murmur Resp: CTABL, no wheezes, non-labored Ext: No edema, warm Neuro: Alert and oriented, No gross deficits  Assessment and plan:  # Lumbar nerve root compression As seen on MRI, refer to neurosurgery Doing well with gabapentin and that his pain is actually little bit better over the last week or so. Stable right foot numbness  # Abdominal aortic aneurysm 3 cm No indication for surgical intervention until 5 cm, repeat ultrasound in 3 years per radiology report recommendations    Orders Placed This Encounter  Procedures  . Ambulatory referral to Neurosurgery    Referral Priority:   Routine    Referral Type:   Surgical    Referral Reason:   Specialty Services Required    Requested Specialty:   Neurosurgery    Number of Visits Requested:   [place], MD [facility] 07/15/2016, 10:25 AM

## 2016-08-07 ENCOUNTER — Encounter: Payer: Self-pay | Admitting: Family Medicine

## 2016-08-07 ENCOUNTER — Ambulatory Visit (INDEPENDENT_AMBULATORY_CARE_PROVIDER_SITE_OTHER): Payer: PPO | Admitting: Family Medicine

## 2016-08-07 VITALS — BP 130/70 | HR 70 | Temp 97.1°F | Ht 65.0 in | Wt 247.0 lb

## 2016-08-07 DIAGNOSIS — M65311 Trigger thumb, right thumb: Secondary | ICD-10-CM | POA: Diagnosis not present

## 2016-08-07 MED ORDER — MELOXICAM 7.5 MG PO TABS: 7.5000 mg | ORAL_TABLET | Freq: Every day | ORAL | 0 refills | Status: DC

## 2016-08-07 NOTE — Progress Notes (Signed)
   HPI  Patient presents today here with some pain.  Patient explains that it's been going on off and on for about 3-4 weeks. He has locking symptoms of his right thumb, pain at the base of the right thumb, at the first PIP and DIP, Locking at the PIP  Injury. No history of similar illness. Patient does not take NSAIDs.  PMH: Smoking status noted ROS: Per HPI  Objective: BP 130/70   Pulse 70   Temp 97.1 F (36.2 C) (Oral)   Ht 5\' 5"  (1.651 m)   Wt 247 lb (112 kg)   BMI 41.10 kg/m  Gen: NAD, alert, cooperative with exam HEENT: NCAT, EOMI, PERRL CV: RRR, good S1/S2, no murmur Resp: CTABL, no wheezes, non-labored Ext: No edema, warm Neuro: Alert and oriented, No gross deficits MSK:  Life swelling of the PIP of the thumb on the right, slight tenderness to palpation, thumb does feel to be locking on exam  Assessment and plan:  # Trigger thumb Treat with very short course of meloxicam, 7.5 mg 7 days Ice aggressively If patient needs third injection would recommend hand specialist given diabetes and multiple comorbidities. Patient to call in 1-2 weeks if symptoms are not improved for hand specialist referral    Meds ordered this encounter  Medications  . meloxicam (MOBIC) 7.5 MG tablet    Sig: Take 1 tablet (7.5 mg total) by mouth daily.    Dispense:  7 tablet    Refill:  0    Laroy Apple, MD [facility] 08/07/2016, 8:41 AM

## 2016-08-07 NOTE — Patient Instructions (Signed)
Great to see you!  Try ice 15 minutes 4-5 times daily for 5 days Try meloxicam for 7 days  Call for referral to hand specialist if this does not resolve the problem.

## 2016-08-19 DIAGNOSIS — E119 Type 2 diabetes mellitus without complications: Secondary | ICD-10-CM | POA: Diagnosis not present

## 2016-08-19 DIAGNOSIS — B351 Tinea unguium: Secondary | ICD-10-CM | POA: Diagnosis not present

## 2016-08-20 DIAGNOSIS — M545 Low back pain, unspecified: Secondary | ICD-10-CM | POA: Insufficient documentation

## 2016-08-20 DIAGNOSIS — M4310 Spondylolisthesis, site unspecified: Secondary | ICD-10-CM | POA: Insufficient documentation

## 2016-08-20 DIAGNOSIS — M5416 Radiculopathy, lumbar region: Secondary | ICD-10-CM | POA: Insufficient documentation

## 2016-08-20 DIAGNOSIS — M4316 Spondylolisthesis, lumbar region: Secondary | ICD-10-CM | POA: Diagnosis not present

## 2016-08-20 DIAGNOSIS — M5126 Other intervertebral disc displacement, lumbar region: Secondary | ICD-10-CM | POA: Diagnosis not present

## 2016-08-20 DIAGNOSIS — M4800 Spinal stenosis, site unspecified: Secondary | ICD-10-CM | POA: Insufficient documentation

## 2016-08-20 DIAGNOSIS — M9983 Other biomechanical lesions of lumbar region: Secondary | ICD-10-CM | POA: Diagnosis not present

## 2016-08-25 ENCOUNTER — Ambulatory Visit (INDEPENDENT_AMBULATORY_CARE_PROVIDER_SITE_OTHER): Payer: PPO | Admitting: Pharmacist

## 2016-08-25 ENCOUNTER — Encounter: Payer: Self-pay | Admitting: Pharmacist

## 2016-08-25 VITALS — BP 136/74 | HR 72 | Ht 65.5 in | Wt 249.0 lb

## 2016-08-25 DIAGNOSIS — M479 Spondylosis, unspecified: Secondary | ICD-10-CM | POA: Insufficient documentation

## 2016-08-25 DIAGNOSIS — Z0000 Encounter for general adult medical examination without abnormal findings: Secondary | ICD-10-CM | POA: Diagnosis not present

## 2016-08-25 DIAGNOSIS — Z87891 Personal history of nicotine dependence: Secondary | ICD-10-CM

## 2016-08-25 NOTE — Patient Instructions (Addendum)
  Mr. Bonn , Thank you for taking time to come for your Medicare Wellness Visit. I appreciate your ongoing commitment to your health goals. Please review the following plan we discussed and let me know if I can assist you in the future.   These are the goals we discussed:  Goals    . Weight (lb) < 225 lb (102.1 kg)          Recommend goal is weight loss of 10% of weight as initial goal.        Try to follow the diet recommendations below - will help to keep triglycerides and blood glucose/sugar down:  Increase non-starchy vegetables - carrots, green bean, squash, zucchini, tomatoes, onions, peppers, spinach and other green leafy vegetables, cabbage, lettuce, cucumbers, asparagus, okra (not fried), eggplant Limit sugar and processed foods (cakes, cookies, ice cream, crackers and chips) Increase fresh fruit but limit serving sizes 1/2 cup or about the size of tennis or baseball Limit red meat to no more than 1-2 times per week (serving size about the size of your palm) Choose whole grains / lean proteins - whole wheat bread, old fashioned oatmeal, quinoa, whole grain rice (1/2 cup), fish, chicken, [place] Avoid sugar and calorie containing beverages - soda, sweet tea and juice.  Choose water or unsweetened tea instead.  Continue to exercise at [facility] daily - goal is to get at least 150 minutes of exercise per week.    This is a list of the screening recommended for you and due dates:  Health Maintenance  Topic Date Due  . Pneumonia vaccines (2 of 2 - PPSV23) 12/30/2014  . Stool Blood Test  10/26/2016  . Colon Cancer Screening  03/01/2019  . Tetanus Vaccine  01/11/2023  . Flu Shot  Completed

## 2016-08-25 NOTE — Progress Notes (Signed)
Patient ID: Hadyn Blanck, male   DOB: Sep 26, 1940, 76 y.o.   MRN: [id number]     Subjective:   Rio Kidane is a 76 y.o. male who presents for a subsequent Medicare Annual Wellness Visit. Mr. Godley is a single WM. He is the primary care giver to his adult brother and they live together in [place].  Mr. Mainville is retired   Review of Systems  Review of Systems  Constitutional: Negative.   HENT: Negative.   Eyes: Negative.        Dry eyes  Respiratory: Negative.   Cardiovascular: Negative.   Gastrointestinal: Negative.   Genitourinary: Negative.        Reports urinary symptoms have improved greatly after PTNS procedure and starting tamsulosin  Musculoskeletal: Positive for back pain (sees Dr Vertell Limber for this and is starting PT tomorrow).  Skin: Negative.   Neurological: Negative.   Endo/Heme/Allergies: Negative.   Psychiatric/Behavioral: Negative.     Current Medications (verified) Outpatient Encounter Prescriptions as of 08/25/2016  Medication Sig  . acetaminophen (TYLENOL) 500 MG tablet Take 500 mg by mouth every 6 (six) hours as needed.  . Cholecalciferol (VITAMIN D3) 2000 UNITS TABS Take 1 tablet by mouth daily.   Marland Kitchen ezetimibe (ZETIA) 10 MG tablet Take 1 tablet (10 mg total) by mouth daily.  Marland Kitchen glucosamine-chondroitin 500-400 MG tablet Take 1 tablet by mouth 3 (three) times daily.  . hydrochlorothiazide (HYDRODIURIL) 12.5 MG tablet Take 1 tablet (12.5 mg total) by mouth daily.  . Multiple Vitamins-Minerals (MULTIVITAMIN WITH MINERALS) tablet Take 1 tablet by mouth daily.  . niacin (NIASPAN) 1000 MG CR tablet Take 1 tablet (1,000 mg total) by mouth at bedtime.  Marland Kitchen nystatin-triamcinolone (MYCOLOG II) cream Apply 1 application topically 2 (two) times daily.  . Omega-3 Fatty Acids (FISH OIL) 1000 MG CAPS Take 2 capsules by mouth daily. 1200  . ramipril (ALTACE) 10 MG capsule Take 1 capsule (10 mg total) by mouth daily.  . simvastatin (ZOCOR) 40 MG tablet Take 1 tablet (40 mg  total) by mouth at bedtime.  . Tamsulosin HCl (FLOMAX) 0.4 MG CAPS Take 0.4 mg by mouth at bedtime.   . fluticasone (FLONASE) 50 MCG/ACT nasal spray Place 2 sprays into both nostrils daily.  . [DISCONTINUED] CALCIUM-MAGNESIUM PO Take 1 tablet by mouth daily.  . [DISCONTINUED] cycloSPORINE (RESTASIS) 0.05 % ophthalmic emulsion Place 1 drop into both eyes 2 (two) times daily. For dry eyes (Patient not taking: Reported on 08/25/2016)  . [DISCONTINUED] diclofenac sodium (VOLTAREN) 1 % GEL Apply 4 g topically 4 (four) times daily. (Patient not taking: Reported on 08/25/2016)  . [DISCONTINUED] meloxicam (MOBIC) 7.5 MG tablet Take 1 tablet (7.5 mg total) by mouth daily. (Patient not taking: Reported on 08/25/2016)   No facility-administered encounter medications on file as of 08/25/2016.     Allergies (verified) Penicillins; Aspirin; Crestor [rosuvastatin calcium]; Gabapentin; Hydrocodone; and Lyrica [pregabalin]   History: Past Medical History:  Diagnosis Date  . BPH (benign prostatic hypertrophy)    Dr. Rosana Hoes / Dr. Joelyn Oms  - Urologist   . Cancer [facility])    lip-mole surgery  . Cataract   . Colon polyps   . DDD (degenerative disc disease)   . Hearing loss   . Hypercholesteremia   . Hyperlipidemia   . Hypertension   . Leukoplakia   . Microscopic hematuria    negative work up with Urology in the past.   . OAB (overactive bladder)    with past percutaneous tibial nerve stimulation therapy  .  Thrombocytopenia ([place])   . Tinnitus   . Tobacco abuse   . Vitamin B 12 deficiency   . Vitamin D deficiency    Past Surgical History:  Procedure Laterality Date  . CATARACT EXTRACTION  2007   right eye  . CATARACT EXTRACTION Left 03/21/15  . COLONOSCOPY    . HERNIA REPAIR  1980  . KNEE SURGERY  02-28-2008   replaced inside right knee  . Left Knee Repair  11-10-2006  . NOSE SURGERY    . POLYPECTOMY    . SKIN BIOPSY  10-08-2006   left ear   Family History  Problem Relation Age of Onset  . Heart  failure Father   . Glaucoma Mother   . Osteoporosis Brother   . Heart disease Brother   . Hypertension Brother   . Hyperlipidemia Brother   . Diabetes Brother   . Colon cancer Neg Hx    Social History   Occupational History  .      retired Immunologist   Social History Main Topics  . Smoking status: Former Smoker    Packs/day: 1.00    Years: 50.00    Types: Cigarettes    Quit date: 08/14/2009  . Smokeless tobacco: Never Used  . Alcohol use No  . Drug use: No  . Sexual activity: No    Do you feel safe at home?  Yes Are there smokers in your home (other than you)? No  Dietary issues and exercise activities discussed: Current Exercise Habits: Structured exercise class, Type of exercise: Other - see comments (reclining bike), Time (Minutes): 30, Frequency (Times/Week): 5, Weekly Exercise (Minutes/Week): 150, Intensity: Moderate  Current Dietary habits:  Drinks diet soda, water and sweet tea from time to time.  Has recently started eating oatmeal for breakfast with honey.      Objective:    Today's Vitals   08/25/16 0929  BP: 136/74  Pulse: 72  Weight: 249 lb (112.9 kg)  Height: 5' 5.5" (1.664 m)  PainSc: 2   PainLoc: Foot   Body mass index is 40.81 kg/m.   Activities of Daily Living In your present state of health, do you have any difficulty performing the following activities: 08/25/2016 02/12/2016  Hearing? N N  Vision? N N  Difficulty concentrating or making decisions? N N  Walking or climbing stairs? Y N  Dressing or bathing? N N  Doing errands, shopping? N N  Preparing Food and eating ? N -  Using the Toilet? N -  In the past six months, have you accidently leaked urine? N -  Do you have problems with loss of bowel control? N -  Managing your Medications? N -  Managing your Finances? N -  Housekeeping or managing your Housekeeping? N -  Some recent data might be hidden     Depression Screen PHQ 2/9 Scores 08/25/2016 08/07/2016 07/15/2016  06/24/2016  PHQ - 2 Score 0 0 0 0     Fall Risk Fall Risk  08/25/2016 08/07/2016 07/15/2016 06/24/2016 06/18/2016  Falls in the past year? No No No No No    Cognitive Function: MMSE - Mini Mental State Exam 08/25/2016 10/25/2015  Orientation to time 5 5  Orientation to Place 5 5  Registration 3 3  Attention/ Calculation 5 4  Recall 3 3  Language- name 2 objects 2 2  Language- repeat 1 1  Language- follow 3 step command 3 3  Language- read & follow direction 1 1  Write a sentence  1 1  Copy design 1 0  Total score 30 28    Immunizations and Health Maintenance Immunization History  Administered Date(s) Administered  . Influenza, High Dose Seasonal PF 03/13/2016  . Influenza,inj,Quad PF,36+ Mos 03/14/2014, 03/16/2015  . Pneumococcal Conjugate-13 12/29/2013  . Rabies, IM 01/10/2013, 01/17/2013, 01/24/2013, 01/31/2013  . Tdap 01/10/2013   Health Maintenance Due  Topic Date Due  . PNA vac Low Risk Adult (2 of 2 - PPSV23) 12/30/2014    Patient Care Team: Timmothy Euler, MD as PCP - General (Family Medicine) Nobie Putnam, MD (Hematology and Oncology) Irine Seal, MD as Attending Physician (Urology) Trellis Paganini, MD as Consulting Physician (Dermatology) Warden Fillers, MD as Consulting Physician (Ophthalmology) Erline Levine, MD as Consulting Physician (Neurosurgery)  Indicate any recent Medical Services you may have received from other than [facility] providers in the past year (date may be approximate).    Assessment:    Annual Wellness Visit  Back pain - seeing neurologist / neurosurgeon Dry eyes - seeing optometrist History of smoking   Screening Tests Health Maintenance  Topic Date Due  . PNA vac Low Risk Adult (2 of 2 - PPSV23) 12/30/2014  . COLON CANCER SCREENING ANNUAL FOBT  10/26/2016  . COLONOSCOPY  03/01/2019  . TETANUS/TDAP  01/11/2023  . INFLUENZA VACCINE  Completed        Plan:   During the course of the visit Kayden was educated and counseled about  the following appropriate screening and preventive services:   Vaccines to include Pneumoccal, Influenza,  Td, Zostavax - appears to be UTD on all vaccines except Pneumovax 23 but may have received prior to e-chart. Will request paper chart to verify if Pneumovax given in past  Colorectal cancer screening - UTD; done 2015  Lipids showed elevated Tg 06/18/2016.  Patient is taking statin and niacin  BP is at goal today  Diabetes screening - prediabetic.  Last A1 was 6.2%.  Mr. Koepp asks for referral to his brother's endocrinologist Dr Buddy Duty.  Will check with his PCP  Bone Denisty / Osteoporosis Screening - UTD  Glaucoma screening / Eye Exam - due recheck now. Patient has appointment  Nutrition counseling - recommended limiting sugar and sugar containing beverages;  Limit high CHO containing foods.  Increase non starchy vegetables, lean proteins and whole grains. Also discussed limiting serving sizes.    Prostate cancer screening - UTD, done by urologist Dr Jeffie Pollock  Smoking cessation counseling - continue to not smoke.  Recommend lung CT since less than 15 years since stopped smoking, history of over 30 ppd and history of lung nodule  Advanced Directives - Discussed - patient has information at home  Physical Activity - continue to exercise daily.  Also continue with plan for PT.   Goals    . Weight (lb) < 225 lb (102.1 kg)          Recommend goal is weight loss of 10% of weight as initial goal.         Patient Instructions (the written plan) were given to the patient.   Cherre Robins, PharmD   08/25/2016

## 2016-08-26 ENCOUNTER — Encounter: Payer: Self-pay | Admitting: Physical Therapy

## 2016-08-26 ENCOUNTER — Ambulatory Visit: Payer: PPO | Attending: Neurosurgery | Admitting: Physical Therapy

## 2016-08-26 DIAGNOSIS — M5442 Lumbago with sciatica, left side: Secondary | ICD-10-CM | POA: Diagnosis not present

## 2016-08-26 NOTE — Patient Instructions (Addendum)
Hip Stretch  Put right ankle over left knee. Let right knee fall downward, but keep ankle in place. Feel the stretch in hip. May push down gently with hand to feel stretch. Hold _30-60___ seconds while counting out loud. Repeat with other leg. Repeat _3___ times. Do __2__ sessions per day.    [name], PT 08/26/16 10:26 AM [facility] [address] [place], [place], [address] Phone: [phone]   Fax:  [phone]

## 2016-08-26 NOTE — Therapy (Signed)
[facility] [place], [place], [address] Phone: [phone]   Fax:  [phone]  Physical Therapy Evaluation  Patient Details  Name: Lonnie Hurley MRN: [id number] Date of Birth: 08-Feb-1941 Referring Provider: Erline Levine MD  Encounter Date: 08/26/2016      PT End of Session - 08/26/16 0954    Visit Number 1   Number of Visits 12   Date for PT Re-Evaluation 10/07/16   PT Start Time 0955   PT Stop Time 1033   PT Time Calculation (min) 38 min   Activity Tolerance Patient tolerated treatment well   Behavior During Therapy [facility] for tasks assessed/performed      Past Medical History:  Diagnosis Date  . BPH (benign prostatic hypertrophy)    Dr. Rosana Hoes / Dr. Joelyn Oms  - Urologist   . Cancer [facility])    lip-mole surgery  . Cataract   . Colon polyps   . DDD (degenerative disc disease)   . Hearing loss   . Hypercholesteremia   . Hyperlipidemia   . Hypertension   . Leukoplakia   . Microscopic hematuria    negative work up with Urology in the past.   . OAB (overactive bladder)    with past percutaneous tibial nerve stimulation therapy  . Thrombocytopenia ([place])   . Tinnitus   . Tobacco abuse   . Vitamin B 12 deficiency   . Vitamin D deficiency     Past Surgical History:  Procedure Laterality Date  . CATARACT EXTRACTION  2007   right eye  . CATARACT EXTRACTION Left 03/21/15  . COLONOSCOPY    . HERNIA REPAIR  1980  . KNEE SURGERY  02-28-2008   replaced inside right knee  . Left Knee Repair  11-10-2006  . NOSE SURGERY    . POLYPECTOMY    . SKIN BIOPSY  10-08-2006   left ear    There were no vitals filed for this visit.       Subjective Assessment - 08/26/16 0955    Subjective Patient states onset of LBP in December 2017. It started when he would lie flat on bed. Now he sleeps in reclinter or love seat. Patient reports pain is somewhat better than Dec, but it still hurts. Patient reports he goes to the [facility] 2x/day,  but he would like to learn exercises to help his back. Sit to stand transition is the worst. He denies pain today, but pain gets to 8-9/10.    Pertinent History HTN, aortic aneurysm, prediabetic   Diagnostic tests xrays and MRI; showed arthritis throughout the spine   Currently in Pain? No/denies   Pain Score 8    Pain Location Back   Pain Orientation Left;Right  L greater than right   Pain Descriptors / Indicators Aching   Pain Type Acute pain   Pain Radiating Towards into post hip. lateral thigh, post calf into lateral ankle   Pain Onset More than a month ago   Pain Frequency Intermittent   Aggravating Factors  lying down, sitting to standing   Pain Relieving Factors heat   Effect of Pain on Daily Activities limited with gardening and strenuous positions            [facility] PT Assessment - 08/26/16 0001      Assessment   Medical Diagnosis low back pain unspecified laterality   Referring Provider Erline Levine MD   Onset Date/Surgical Date 06/15/16   Next MD Visit 10/20/16     Precautions  Precaution Comments No heavy lifting/avoid strained position     Balance Screen   Has the patient fallen in the past 6 months No   Has the patient had a decrease in activity level because of a fear of falling?  No   Is the patient reluctant to leave their home because of a fear of falling?  No     Prior Function   Level of Independence Independent   Vocation Retired     ROM / Strength   AROM / PROM / Strength AROM;Strength     AROM   AROM Assessment Site Lumbar   Lumbar Flexion full   Lumbar Extension full   Lumbar - Right Side Bend 50%  pain on R   Lumbar - Left Side Bend 25%  left side pain mild   Lumbar - Right Rotation 75%   Lumbar - Left Rotation 75%     Strength   Overall Strength Comments R hip flex 4+/5 pain in left back, L hip flex 5/5; grossly 5/5 in BLE     Flexibility   Soft Tissue Assessment /Muscle Length yes   Hamstrings R   Piriformis R                            PT Education - 08/26/16 1145    Education provided Yes   Education Details HEP   Person(s) Educated Patient   Methods Explanation;Demonstration;Handout   Comprehension Returned demonstration;Verbalized understanding             PT Long Term Goals - 08/26/16 1155      PT LONG TERM GOAL #1   Title I with HEP for core strengthening and flexibiltiy.   Time 6   Period Weeks   Status New     PT LONG TERM GOAL #2   Title Patient able to lie supine or hooklying to sleep with pain less than 2/10.   Time 6   Period Weeks   Status New     PT LONG TERM GOAL #3   Title Patient to demo improved FOTO to CJ   Time 6   Period Weeks   Status New               Plan - 08/26/16 1146    Clinical Impression Statement Patient presents with c/o LBP with intermittent radicular symptoms mainly into L buttock and leg. He has stenosis and bulging discs per MRI. Patient is going to the [facility] 2x/day and is trying to lose weight. He would like to learn specific back exercises and decrease his pain which occurs mainly with sit to stand transition and lying supine. Patient is reluctant to lie supine secondary to significant pain he experienced in December and with MRI. Patient will benefit from TE to strengthen his core and improve his flexibilty.   Rehab Potential Good   PT Frequency 2x / week   PT Duration 6 weeks   PT Treatment/Interventions ADLs/Self Care Home Management;Electrical Stimulation;Moist Heat;Traction;Ultrasound;Therapeutic activities;Therapeutic exercise;Neuromuscular re-education;Manual techniques;Patient/family education;Dry needling;Taping   PT Next Visit Plan Core strengthening (start in supine as tolerated), hip flexibility, QL stretching. Modalities PRN. Traction?,    PT Home Exercise Plan seated piriformis stretch   Consulted and Agree with Plan of Care Patient      Patient will benefit from skilled therapeutic intervention in  order to improve the following deficits and impairments:  Postural dysfunction, Impaired flexibility, Decreased range of motion, Decreased activity tolerance,  Pain  Visit Diagnosis: Acute left-sided low back pain with left-sided sciatica - Plan: PT plan of care cert/re-cert      G-Codes - 88/41/66 10-07-1209    Functional Assessment Tool Used (Outpatient Only) FOTO 42% LIMITED   Functional Limitation Other PT primary   Other PT Primary Current Status (A6301) At least 40 percent but less than 60 percent impaired, limited or restricted   Other PT Primary Goal Status (S0109) At least 20 percent but less than 40 percent impaired, limited or restricted       Problem List Patient Active Problem List   Diagnosis Date Noted  . Arthritis of back 08/25/2016  . AAA (abdominal aortic aneurysm) without rupture ([place]) 07/13/2016  . Lumbar spondylosis with myelopathy 07/13/2016  . Pre-diabetes 09/18/2015  . Metabolic syndrome 32/35/5732  . Vitamin B 12 deficiency 12/14/2014  . Hereditary and idiopathic peripheral neuropathy 10/17/2014  . Vitamin D deficiency 06/29/2014  . BPH (benign prostatic hyperplasia) 06/29/2014  . Hypertension 11/17/2012  . Hyperlipidemia 11/17/2012  . DDD (degenerative disc disease), lumbar 11/17/2012  . Thrombocytopenia [facility])     [name] PT 08/26/2016, 12:19 PM  [facility] [address] [place], [place], [address] Phone: [phone]   Fax:  [phone]  Name: Kodah Maret MRN: [id number] Date of Birth: [birth year]

## 2016-09-02 ENCOUNTER — Ambulatory Visit: Payer: PPO | Admitting: Physical Therapy

## 2016-09-02 DIAGNOSIS — M5442 Lumbago with sciatica, left side: Secondary | ICD-10-CM | POA: Diagnosis not present

## 2016-09-02 NOTE — Therapy (Signed)
[facility] [address] [place], [place], [address] Phone: [phone]   Fax:  [phone]  Physical Therapy Treatment  Patient Details  Name: Lonnie Hurley MRN: [id number] Date of Birth: Dec 02, 1940 Referring Provider: Maeola Harman MD  Encounter Date: 09/02/2016      PT End of Session - 09/02/16 0945    Visit Number 2   Number of Visits 12   Date for PT Re-Evaluation 10/07/16   PT Start Time 0945   PT Stop Time 1032   PT Time Calculation (min) 47 min   Activity Tolerance Patient tolerated treatment well   Behavior During Therapy [facility] for tasks assessed/performed      Past Medical History:  Diagnosis Date  . BPH (benign prostatic hypertrophy)    Dr. Earlene Plater / Dr. Etta Grandchild  - Urologist   . Cancer [facility])    lip-mole surgery  . Cataract   . Colon polyps   . DDD (degenerative disc disease)   . Hearing loss   . Hypercholesteremia   . Hyperlipidemia   . Hypertension   . Leukoplakia   . Microscopic hematuria    negative work up with Urology in the past.   . OAB (overactive bladder)    with past percutaneous tibial nerve stimulation therapy  . Thrombocytopenia ([facility])   . Tinnitus   . Tobacco abuse   . Vitamin B 12 deficiency   . Vitamin D deficiency     Past Surgical History:  Procedure Laterality Date  . CATARACT EXTRACTION  2007   right eye  . CATARACT EXTRACTION Left 03/21/15  . COLONOSCOPY    . HERNIA REPAIR  1980  . KNEE SURGERY  02-28-2008   replaced inside right knee  . Left Knee Repair  11-10-2006  . NOSE SURGERY    . POLYPECTOMY    . SKIN BIOPSY  10-08-2006   left ear    There were no vitals filed for this visit.      Subjective Assessment - 09/02/16 0945    Subjective Patient states he does not have pain at the moment. He knows what not to do.   Pertinent History HTN, aortic aneurysm, prediabetic   Diagnostic tests xrays and MRI; showed arthritis throughout the spine   Currently in Pain? No/denies                          [facility] Adult PT Treatment/Exercise - 09/02/16 0001      Exercises   Exercises Lumbar     Lumbar Exercises: Stretches   Passive Hamstring Stretch 2 reps;30 seconds   Single Knee to Chest Stretch 1 rep;60 seconds  B   Single Knee to Chest Stretch Limitations to opposite shoulder   ITB Stretch 1 rep;30 seconds  left side only with strap   Piriformis Stretch 2 reps;30 seconds  B; seated      Lumbar Exercises: Aerobic   Stationary Bike Nustep L3 x     Lumbar Exercises: Seated   Other Seated Lumbar Exercises lumbar spine rocking forward and backward with tactile and verbal cues; attempted on mat table and in straight back chair     Lumbar Exercises: Supine   Bridge 10 reps   Straight Leg Raise 10 reps  B with elbow press into table for ab engagement   Isometric Hip Flexion 5 reps  B                PT Education - 09/02/16 1234  Education provided Yes   Education Details HeP; and discussed putting foot up on cupboard when doing dishes at sink   Person(s) Educated Patient   Methods Explanation;Demonstration;Handout;Verbal cues   Comprehension Verbalized understanding;Returned demonstration             PT Long Term Goals - 08/26/16 1155      PT LONG TERM GOAL #1   Title I with HEP for core strengthening and flexibiltiy.   Time 6   Period Weeks   Status New     PT LONG TERM GOAL #2   Title Patient able to lie supine or hooklying to sleep with pain less than 2/10.   Time 6   Period Weeks   Status New     PT LONG TERM GOAL #3   Title Patient to demo improved FOTO to CJ   Time 6   Period Weeks   Status New               Plan - 09/02/16 1235    Clinical Impression Statement Patient did well today with TE. He demonstrated B HS flexibility WNL today which was marked improvement from IE. He reported some R SI region pain with L piriformis stretching. He had difficulty with pelvic rocking and will require  further instruction to master. No goals met as only second visit.   Rehab Potential Good   PT Frequency 2x / week   PT Duration 6 weeks   PT Treatment/Interventions ADLs/Self Care Home Management;Electrical Stimulation;Moist Heat;Traction;Ultrasound;Therapeutic activities;Therapeutic exercise;Neuromuscular re-education;Manual techniques;Patient/family education;Dry needling;Taping   PT Next Visit Plan Core strengthening (start in supine as tolerated), hip flexibility, QL stretching. Modalities PRN. Traction?,    PT Home Exercise Plan seated piriformis stretch; bridging   Consulted and Agree with Plan of Care Patient      Patient will benefit from skilled therapeutic intervention in order to improve the following deficits and impairments:  Postural dysfunction, Impaired flexibility, Decreased range of motion, Decreased activity tolerance, Pain, Abnormal gait  Visit Diagnosis: Acute left-sided low back pain with left-sided sciatica     Problem List Patient Active Problem List   Diagnosis Date Noted  . Arthritis of back 08/25/2016  . AAA (abdominal aortic aneurysm) without rupture ([place]) 07/13/2016  . Lumbar spondylosis with myelopathy 07/13/2016  . Pre-diabetes 09/18/2015  . Metabolic syndrome 04/18/1593  . Vitamin B 12 deficiency 12/14/2014  . Hereditary and idiopathic peripheral neuropathy 10/17/2014  . Vitamin D deficiency 06/29/2014  . BPH (benign prostatic hyperplasia) 06/29/2014  . Hypertension 11/17/2012  . Hyperlipidemia 11/17/2012  . DDD (degenerative disc disease), lumbar 11/17/2012  . Thrombocytopenia [facility])     [name] PT 09/02/2016, 12:39 PM  [facility] [place], [place], [address] Phone: [phone]   Fax:  [phone]  Name: Talis Iwan MRN: [id number] Date of Birth: April 04, 1941

## 2016-09-02 NOTE — Patient Instructions (Signed)
Bridge [employer] small of back into mat, maintain pelvic tilt, roll up one vertebrae at a time. Focus on engaging posterior hip muscles. Hold for _5-10 seconds. Repeat _10-20___ times. 1-2 times per day.  MAKE SURE TO BREATHE! DONT HOLD YOUR BREATH.  [name], PT 09/02/16 10:32 AM; [facility] [place], [place], [address] Phone: [phone]   Fax:  [phone]

## 2016-09-05 DIAGNOSIS — L570 Actinic keratosis: Secondary | ICD-10-CM | POA: Diagnosis not present

## 2016-09-05 DIAGNOSIS — D1801 Hemangioma of skin and subcutaneous tissue: Secondary | ICD-10-CM | POA: Diagnosis not present

## 2016-09-05 DIAGNOSIS — Z85828 Personal history of other malignant neoplasm of skin: Secondary | ICD-10-CM | POA: Diagnosis not present

## 2016-09-05 DIAGNOSIS — L821 Other seborrheic keratosis: Secondary | ICD-10-CM | POA: Diagnosis not present

## 2016-09-05 DIAGNOSIS — L812 Freckles: Secondary | ICD-10-CM | POA: Diagnosis not present

## 2016-09-09 ENCOUNTER — Ambulatory Visit: Payer: PPO | Admitting: Physical Therapy

## 2016-09-09 ENCOUNTER — Encounter: Payer: Self-pay | Admitting: *Deleted

## 2016-09-09 DIAGNOSIS — M5442 Lumbago with sciatica, left side: Secondary | ICD-10-CM

## 2016-09-09 NOTE — Therapy (Signed)
[facility] [place], [place], [address] Phone: [phone]   Fax:  [phone]  Physical Therapy Treatment  Patient Details  Name: Lonnie Hurley MRN: [id number] Date of Birth: [birth year] Referring Provider: Erline Levine MD  Encounter Date: 09/09/2016      PT End of Session - 09/09/16 0905    Visit Number 3   Number of Visits 12   Date for PT Re-Evaluation 10/07/16   PT Start Time 0903   PT Stop Time 0946   PT Time Calculation (min) 43 min   Activity Tolerance Patient tolerated treatment well   Behavior During Therapy [facility] for tasks assessed/performed      Past Medical History:  Diagnosis Date  . BPH (benign prostatic hypertrophy)    Dr. Rosana Hoes / Dr. Joelyn Oms  - Urologist   . Cancer [facility])    lip-mole surgery  . Cataract   . Colon polyps   . DDD (degenerative disc disease)   . Hearing loss   . Hypercholesteremia   . Hyperlipidemia   . Hypertension   . Leukoplakia   . Microscopic hematuria    negative work up with Urology in the past.   . OAB (overactive bladder)    with past percutaneous tibial nerve stimulation therapy  . Thrombocytopenia ([place])   . Tinnitus   . Tobacco abuse   . Vitamin B 12 deficiency   . Vitamin D deficiency     Past Surgical History:  Procedure Laterality Date  . CATARACT EXTRACTION  2007   right eye  . CATARACT EXTRACTION Left 03/21/15  . COLONOSCOPY    . HERNIA REPAIR  1980  . KNEE SURGERY  02-28-2008   replaced inside right knee  . Left Knee Repair  11-10-2006  . NOSE SURGERY    . POLYPECTOMY    . SKIN BIOPSY  10-08-2006   left ear    There were no vitals filed for this visit.      Subjective Assessment - 09/09/16 0906    Subjective Patient reports he was sore on the R side for a couple of days after his last visit.   Pertinent History HTN, aortic aneurysm, prediabetic   Diagnostic tests xrays and MRI; showed arthritis throughout the spine   Patient Stated Goals to get  exercises  to strengthen him and decrease pain   Currently in Pain? Yes   Pain Score 2    Pain Location Back   Pain Orientation Left;Right   Pain Descriptors / Indicators Aching   Pain Type Acute pain   Pain Radiating Towards into post hip, lateral thigh, post calf to ankle (not today)   Pain Onset More than a month ago   Pain Frequency Intermittent   Aggravating Factors  lying down, sitting to standing   Pain Relieving Factors heat   Effect of Pain on Daily Activities limited with gardening and strenuous position                         [facility] Adult PT Treatment/Exercise - 09/09/16 0001      Lumbar Exercises: Aerobic   Stationary Bike Nustep L5 x 6 min     Lumbar Exercises: Seated   Other Seated Lumbar Exercises seated chair yoga activities: cat/cow 2 x 10, OH reach 2 x 10, lumbar flexion (floor reach) 2 x 10, side angle reach x 10 B, seated twist x 10 B  PT Education - 09/09/16 1231    Education provided Yes   Education Details HEP   Person(s) Educated Patient   Methods Explanation;Demonstration;Handout;Verbal cues;Tactile cues   Comprehension Verbalized understanding;Returned demonstration             PT Long Term Goals - 09/09/16 1232      PT LONG TERM GOAL #1   Title I with HEP for core strengthening and flexibiltiy.   Time 6   Period Weeks   Status On-going     PT LONG TERM GOAL #2   Title Patient able to lie supine or hooklying to sleep with pain less than 2/10.   Time 6   Period Weeks   Status On-going     PT LONG TERM GOAL #3   Title Patient to demo improved FOTO to CJ   Time 6   Period Weeks   Status On-going               Plan - 09/09/16 1232    Clinical Impression Statement Patient did very well today with chair yoga activities. He required verbal cues and tactile cues to perform correctly, but tolerated all without c/o increased pain. He did much better with pelvic rocking today. HEP was progressed  and goals are ongoing.   Rehab Potential Good   PT Frequency 2x / week   PT Duration 6 weeks   PT Treatment/Interventions ADLs/Self Care Home Management;Electrical Stimulation;Moist Heat;Traction;Ultrasound;Therapeutic activities;Therapeutic exercise;Neuromuscular re-education;Manual techniques;Patient/family education;Dry needling;Taping   PT Next Visit Plan Continue with chair yoga activite ([email or web address].com for examples); Core strengthening (start in supine as tolerated), hip flexibility, QL stretching. Modalities PRN. Traction?,    PT Home Exercise Plan pelvic rocking, lumbar flexion in chair, seated piriformis stretch; bridging   Consulted and Agree with Plan of Care Patient      Patient will benefit from skilled therapeutic intervention in order to improve the following deficits and impairments:  Postural dysfunction, Impaired flexibility, Decreased range of motion, Decreased activity tolerance, Pain, Abnormal gait  Visit Diagnosis: Acute left-sided low back pain with left-sided sciatica     Problem List Patient Active Problem List   Diagnosis Date Noted  . Arthritis of back 08/25/2016  . AAA (abdominal aortic aneurysm) without rupture ([place]) 07/13/2016  . Lumbar spondylosis with myelopathy 07/13/2016  . Pre-diabetes 09/18/2015  . Metabolic syndrome 10/31/3356  . Vitamin B 12 deficiency 12/14/2014  . Hereditary and idiopathic peripheral neuropathy 10/17/2014  . Vitamin D deficiency 06/29/2014  . BPH (benign prostatic hyperplasia) 06/29/2014  . Hypertension 11/17/2012  . Hyperlipidemia 11/17/2012  . DDD (degenerative disc disease), lumbar 11/17/2012  . Thrombocytopenia [facility])    [name] PT 09/09/2016, 12:36 PM  [facility] [address] [place], [place], [address] Phone: [phone]   Fax:  [phone]  Name: Lonnie Hurley MRN: [id number] Date of Birth: [birth year]

## 2016-09-09 NOTE — Patient Instructions (Signed)
   [name], PT 09/09/16 9:45 AM  [facility]

## 2016-09-16 ENCOUNTER — Ambulatory Visit: Payer: PPO | Attending: Neurosurgery | Admitting: *Deleted

## 2016-09-16 DIAGNOSIS — M5442 Lumbago with sciatica, left side: Secondary | ICD-10-CM | POA: Insufficient documentation

## 2016-09-16 NOTE — Therapy (Signed)
[facility] [place], [place], [address] Phone: [phone]   Fax:  [phone]  Physical Therapy Treatment  Patient Details  Name: Lonnie Hurley MRN: [id number] Date of Birth: 1940/10/26 Referring Provider: Erline Levine MD  Encounter Date: 09/16/2016      PT End of Session - 09/16/16 1004    Visit Number 4   Number of Visits 12   Date for PT Re-Evaluation 10/07/16   PT Start Time 0945   PT Stop Time 6503   PT Time Calculation (min) 50 min      Past Medical History:  Diagnosis Date  . BPH (benign prostatic hypertrophy)    Dr. Rosana Hoes / Dr. Joelyn Oms  - Urologist   . Cancer [facility])    lip-mole surgery  . Cataract   . Colon polyps   . DDD (degenerative disc disease)   . Hearing loss   . Hypercholesteremia   . Hyperlipidemia   . Hypertension   . Leukoplakia   . Microscopic hematuria    negative work up with Urology in the past.   . OAB (overactive bladder)    with past percutaneous tibial nerve stimulation therapy  . Thrombocytopenia ([place])   . Tinnitus   . Tobacco abuse   . Vitamin B 12 deficiency   . Vitamin D deficiency     Past Surgical History:  Procedure Laterality Date  . CATARACT EXTRACTION  2007   right eye  . CATARACT EXTRACTION Left 03/21/15  . COLONOSCOPY    . HERNIA REPAIR  1980  . KNEE SURGERY  02-28-2008   replaced inside right knee  . Left Knee Repair  11-10-2006  . NOSE SURGERY    . POLYPECTOMY    . SKIN BIOPSY  10-08-2006   left ear    There were no vitals filed for this visit.      Subjective Assessment - 09/16/16 1000    Subjective  I got a little sore after last Rx. Might need to go  over them   Pertinent History HTN, aortic aneurysm, prediabetic   Diagnostic tests xrays and MRI; showed arthritis throughout the spine   Patient Stated Goals to get exercises  to strengthen him and decrease pain   Currently in Pain? Yes   Pain Location Back   Pain Orientation Right;Left   Pain  Descriptors / Indicators Aching   Pain Type Acute pain   Pain Onset More than a month ago   Pain Frequency Intermittent                         [facility] Adult PT Treatment/Exercise - 09/16/16 0001      Exercises   Exercises Lumbar     Lumbar Exercises: Stretches   Piriformis Stretch 5 reps;30 seconds  Bilateral LE's      Lumbar Exercises: Aerobic   Stationary Bike Nustep L5 x   10 min     Lumbar Exercises: Seated   Other Seated Lumbar Exercises seated chair yoga activities: cat/cow 2 x 10, OH reach 2 x 10, lumbar flexion (floor reach) 2 x 10, side angle reach x 10 B,  UE  Diagonal reaching  x 10 each side     Lumbar Exercises: Supine   Bridge 10 reps   Straight Leg Raise 10 reps  B with elbow press into table for ab engagement Bil LE's     Attempted sitting piriformis stretch, but causes LT knee pain  PT Long Term Goals - 09/09/16 1232      PT LONG TERM GOAL #1   Title I with HEP for core strengthening and flexibiltiy.   Time 6   Period Weeks   Status On-going     PT LONG TERM GOAL #2   Title Patient able to lie supine or hooklying to sleep with pain less than 2/10.   Time 6   Period Weeks   Status On-going     PT LONG TERM GOAL #3   Title Patient to demo improved FOTO to CJ   Time 6   Period Weeks   Status On-going               Plan - 09/16/16 1017    Clinical Impression Statement Pt did fairly well today with exs and was able to modify 2 Yoga exs so that pt could perform. The sitting piriformis stretch was performed in supine due to LT knee pain and the flexion with rotation was changed to just thoracic rotation.    PT Frequency 2x / week   PT Duration 6 weeks   PT Treatment/Interventions ADLs/Self Care Home Management;Electrical Stimulation;Moist Heat;Traction;Ultrasound;Therapeutic activities;Therapeutic exercise;Neuromuscular re-education;Manual techniques;Patient/family education;Dry needling;Taping   PT  Next Visit Plan Continue with chair yoga activite ([email or web address].com for examples); Core strengthening (start in supine as tolerated), hip flexibility, QL stretching. Modalities PRN. Traction?,    PT Home Exercise Plan pelvic rocking, lumbar flexion in chair, seated piriformis stretch; bridging   Consulted and Agree with Plan of Care Patient      Patient will benefit from skilled therapeutic intervention in order to improve the following deficits and impairments:  Postural dysfunction, Impaired flexibility, Decreased range of motion, Decreased activity tolerance, Pain, Abnormal gait  Visit Diagnosis: Acute left-sided low back pain with left-sided sciatica     Problem List Patient Active Problem List   Diagnosis Date Noted  . Arthritis of back 08/25/2016  . AAA (abdominal aortic aneurysm) without rupture ([place]) 07/13/2016  . Lumbar spondylosis with myelopathy 07/13/2016  . Pre-diabetes 09/18/2015  . Metabolic syndrome 68/61/6837  . Vitamin B 12 deficiency 12/14/2014  . Hereditary and idiopathic peripheral neuropathy 10/17/2014  . Vitamin D deficiency 06/29/2014  . BPH (benign prostatic hyperplasia) 06/29/2014  . Hypertension 11/17/2012  . Hyperlipidemia 11/17/2012  . DDD (degenerative disc disease), lumbar 11/17/2012  . Thrombocytopenia ([name])     RAMSEUR,CHRIS, PTA 09/16/2016, 5:41 PM  [facility] [address] [place], [place], [address] Phone: [phone]   Fax:  [phone]  Name: Lonnie Hurley MRN: [id number] Date of Birth: 1941-04-14

## 2016-09-22 ENCOUNTER — Other Ambulatory Visit: Payer: Self-pay

## 2016-09-22 ENCOUNTER — Ambulatory Visit (HOSPITAL_COMMUNITY)
Admission: RE | Admit: 2016-09-22 | Discharge: 2016-09-22 | Disposition: A | Discharge status: Home / Self Care | Payer: PPO | Source: Ambulatory Visit | Attending: Family Medicine | Admitting: Family Medicine

## 2016-09-22 DIAGNOSIS — M4316 Spondylolisthesis, lumbar region: Secondary | ICD-10-CM | POA: Diagnosis not present

## 2016-09-22 DIAGNOSIS — M5416 Radiculopathy, lumbar region: Secondary | ICD-10-CM | POA: Diagnosis not present

## 2016-09-22 DIAGNOSIS — Z87891 Personal history of nicotine dependence: Secondary | ICD-10-CM

## 2016-09-22 DIAGNOSIS — M9983 Other biomechanical lesions of lumbar region: Secondary | ICD-10-CM | POA: Diagnosis not present

## 2016-09-23 ENCOUNTER — Ambulatory Visit: Payer: PPO | Admitting: *Deleted

## 2016-09-30 ENCOUNTER — Ambulatory Visit: Payer: PPO | Admitting: Physical Therapy

## 2016-09-30 DIAGNOSIS — M5442 Lumbago with sciatica, left side: Secondary | ICD-10-CM

## 2016-09-30 NOTE — Patient Instructions (Signed)
CHEST: Doorway, Bilateral - Standing    Standing in doorway, place hands on wall with elbows bent at shoulder height. PUT RIGHT FOOT FORWARD TO TAKE PRESSURE OFF YOUR BACK. Lean forward. Hold _30__ seconds. __3_ reps per set, __2_ sets per day, ___ days per week   [name], PT 09/30/16 10:30 AM [facility] [place], [place], [address] Phone: [phone]   Fax:  [phone]

## 2016-09-30 NOTE — Therapy (Signed)
[facility] [place], [place], [address] Phone: [phone]   Fax:  [phone]  Physical Therapy Treatment  Patient Details  Name: Lonnie Hurley MRN: [id number] Date of Birth: 23-Sep-1940 Referring Provider: Erline Levine MD  Encounter Date: 09/30/2016      PT End of Session - 09/30/16 0947    Visit Number 5   Number of Visits 12   Date for PT Re-Evaluation 10/07/16   PT Start Time 0947   PT Stop Time 1030   PT Time Calculation (min) 43 min   Activity Tolerance Patient tolerated treatment well   Behavior During Therapy [facility] for tasks assessed/performed      Past Medical History:  Diagnosis Date  . BPH (benign prostatic hypertrophy)    Dr. Rosana Hoes / Dr. Joelyn Oms  - Urologist   . Cancer [facility])    lip-mole surgery  . Cataract   . Colon polyps   . DDD (degenerative disc disease)   . Hearing loss   . Hypercholesteremia   . Hyperlipidemia   . Hypertension   . Leukoplakia   . Microscopic hematuria    negative work up with Urology in the past.   . OAB (overactive bladder)    with past percutaneous tibial nerve stimulation therapy  . Thrombocytopenia ([place])   . Tinnitus   . Tobacco abuse   . Vitamin B 12 deficiency   . Vitamin D deficiency     Past Surgical History:  Procedure Laterality Date  . CATARACT EXTRACTION  2007   right eye  . CATARACT EXTRACTION Left 03/21/15  . COLONOSCOPY    . HERNIA REPAIR  1980  . KNEE SURGERY  02-28-2008   replaced inside right knee  . Left Knee Repair  11-10-2006  . NOSE SURGERY    . POLYPECTOMY    . SKIN BIOPSY  10-08-2006   left ear    There were no vitals filed for this visit.      Subjective Assessment - 09/30/16 0948    Subjective Patient reports he is 8 days post injection to B low back. He feels the injections helped and that he is around a 3/10 overall and mainly just sore, not pain. Soreness is in the low back and not much in his hips. His left knee is what really is  hurting.    Pertinent History HTN, aortic aneurysm, prediabetic   Diagnostic tests xrays and MRI; showed arthritis throughout the spine   Patient Stated Goals to get exercises  to strengthen him and decrease pain   Currently in Pain? Yes   Pain Score 3    Pain Location Back   Pain Orientation Right;Left;Lower   Pain Descriptors / Indicators Aching   Pain Type Acute pain   Pain Onset More than a month ago   Pain Frequency Intermittent   Multiple Pain Sites Yes   Pain Score 4   Pain Location Knee   Pain Orientation Left   Pain Descriptors / Indicators Aching   Pain Type Chronic pain   Aggravating Factors  weightbearing   Pain Relieving Factors Nustep and medicated gel                         [facility] Adult PT Treatment/Exercise - 09/30/16 0001      Lumbar Exercises: Aerobic   Stationary Bike Nustep L5 x   10 min     Lumbar Exercises: Machines for Strengthening   Other Lumbar Machine Exercise Ab  crunch 40# x 15; 50# x 15     Lumbar Exercises: Standing   Row Strengthening;20 reps;Theraband  semi tandem stance   Row Limitations pink xts     Lumbar Exercises: Seated   Other Seated Lumbar Exercises seated reach OH and forward and then backward x 10 ea.      Lumbar Exercises: Supine   Bridge 10 reps  10 sec hold   Straight Leg Raise 10 reps  B with elbow press into table for ab engagement Bil LE's                PT Education - 09/30/16 1623    Education provided Yes   Education Details HeP   Person(s) Educated Patient   Methods Explanation;Demonstration;Handout   Comprehension Verbalized understanding;Returned demonstration             PT Long Term Goals - 09/09/16 1232      PT LONG TERM GOAL #1   Title I with HEP for core strengthening and flexibiltiy.   Time 6   Period Weeks   Status On-going     PT LONG TERM GOAL #2   Title Patient able to lie supine or hooklying to sleep with pain less than 2/10.   Time 6   Period Weeks    Status On-going     PT LONG TERM GOAL #3   Title Patient to demo improved FOTO to CJ   Time 6   Period Weeks   Status On-going               Plan - 09/30/16 1623    Clinical Impression Statement Patient did very well with TE today reporting decreased pain overall.    PT Treatment/Interventions ADLs/Self Care Home Management;Electrical Stimulation;Moist Heat;Traction;Ultrasound;Therapeutic activities;Therapeutic exercise;Neuromuscular re-education;Manual techniques;Patient/family education;Dry needling;Taping   PT Next Visit Plan Continue with chair yoga activite ([email or web address].com for examples); Core strengthening (start in supine as tolerated), hip flexibility, QL stretching. Modalities PRN.    PT Home Exercise Plan doorway pec stretch, pelvic rocking, lumbar flexion in chair, seated piriformis stretch; bridging      Patient will benefit from skilled therapeutic intervention in order to improve the following deficits and impairments:  Postural dysfunction, Impaired flexibility, Decreased range of motion, Decreased activity tolerance, Pain, Abnormal gait  Visit Diagnosis: Acute left-sided low back pain with left-sided sciatica     Problem List Patient Active Problem List   Diagnosis Date Noted  . Arthritis of back 08/25/2016  . AAA (abdominal aortic aneurysm) without rupture ([place]) 07/13/2016  . Lumbar spondylosis with myelopathy 07/13/2016  . Pre-diabetes 09/18/2015  . Metabolic syndrome 83/41/9622  . Vitamin B 12 deficiency 12/14/2014  . Hereditary and idiopathic peripheral neuropathy 10/17/2014  . Vitamin D deficiency 06/29/2014  . BPH (benign prostatic hyperplasia) 06/29/2014  . Hypertension 11/17/2012  . Hyperlipidemia 11/17/2012  . DDD (degenerative disc disease), lumbar 11/17/2012  . Thrombocytopenia [facility])     [name] PT 09/30/2016, 4:25 PM  [facility] [address] [place], [place], [address] Phone:  [phone]   Fax:  [phone]  Name: Alonzo Owczarzak MRN: [id number] Date of Birth: 11/12/1940

## 2016-10-07 ENCOUNTER — Ambulatory Visit: Payer: PPO | Admitting: Physical Therapy

## 2016-10-07 DIAGNOSIS — M5442 Lumbago with sciatica, left side: Secondary | ICD-10-CM | POA: Diagnosis not present

## 2016-10-07 NOTE — Therapy (Signed)
[facility] [place], [place], [address] Phone: [phone]   Fax:  [phone]  Physical Therapy Treatment  Patient Details  Name: Lonnie Hurley MRN: [id number] Date of Birth: 02/19/1941 Referring Provider: Erline Levine MD  Encounter Date: 10/07/2016      PT End of Session - 10/07/16 0950    Visit Number 6   Number of Visits 12   Date for PT Re-Evaluation 11/07/16   PT Start Time 0947   PT Stop Time 1032   PT Time Calculation (min) 45 min   Activity Tolerance Patient tolerated treatment well   Behavior During Therapy [facility] for tasks assessed/performed      Past Medical History:  Diagnosis Date  . BPH (benign prostatic hypertrophy)    Dr. Rosana Hoes / Dr. Joelyn Oms  - Urologist   . Cancer [facility])    lip-mole surgery  . Cataract   . Colon polyps   . DDD (degenerative disc disease)   . Hearing loss   . Hypercholesteremia   . Hyperlipidemia   . Hypertension   . Leukoplakia   . Microscopic hematuria    negative work up with Urology in the past.   . OAB (overactive bladder)    with past percutaneous tibial nerve stimulation therapy  . Thrombocytopenia ([place])   . Tinnitus   . Tobacco abuse   . Vitamin B 12 deficiency   . Vitamin D deficiency     Past Surgical History:  Procedure Laterality Date  . CATARACT EXTRACTION  2007   right eye  . CATARACT EXTRACTION Left 03/21/15  . COLONOSCOPY    . HERNIA REPAIR  1980  . KNEE SURGERY  02-28-2008   replaced inside right knee  . Left Knee Repair  11-10-2006  . NOSE SURGERY    . POLYPECTOMY    . SKIN BIOPSY  10-08-2006   left ear    There were no vitals filed for this visit.      Subjective Assessment - 10/07/16 0952    Subjective Patient reports soreness in back but not pain. He has 2-3/10 pain in his knee.   Pertinent History HTN, aortic aneurysm, prediabetic   Diagnostic tests xrays and MRI; showed arthritis throughout the spine   Patient Stated Goals to get  exercises  to strengthen him and decrease pain   Currently in Pain? No/denies   Multiple Pain Sites Yes   Pain Score 3   Pain Location Knee   Pain Orientation Left   Pain Descriptors / Indicators Aching   Pain Type Chronic pain                         [facility] Adult PT Treatment/Exercise - 10/07/16 0001      Lumbar Exercises: Stretches   ITB Stretch 2 reps;30 seconds   ITB Stretch Limitations Pain with SDLY off EOB, performed supine with strap across body     Lumbar Exercises: Aerobic   Stationary Bike Nustep L5 x  13 min     Manual Therapy   Manual Therapy Soft tissue mobilization;Myofascial release   Soft tissue mobilization to L lateral quads and HS   Myofascial Release to L ITB with TPR to lateral quads/HS                PT Education - 10/07/16 1232    Education provided Yes   Education Details Self MFR with dowel/rolling pin and/or tennis ball   Person(s) Educated Patient  Methods Explanation;Demonstration;Verbal cues   Comprehension Verbalized understanding             PT Long Term Goals - 06-Nov-2016 0953      PT LONG TERM GOAL #1   Title I with HEP for core strengthening and flexibiltiy.   Period Weeks   Status Achieved     PT LONG TERM GOAL #2   Title Patient able to lie supine or hooklying to sleep with pain less than 2/10.   Baseline Goal met for treatment, but patient hasn't attempted sleeping in bed.   Time 6   Period Weeks   Status On-going     PT LONG TERM GOAL #3   Title Patient to demo improved FOTO to [place] 38% (CJ) as of Nov 06, 2016   Time 6   Period Weeks   Status Achieved               Plan - 11/06/2016 1234    Clinical Impression Statement Patient presents today with continued reports of improvement with LBP, but ongoing L knee pain. Patient demo's marked ITB and lateral quad and HS tightness with trigger points throughout. He als has trigger points in L lateral gastroc as well. He tolerated deep  MFR and STW to these areas and was able to cross his leg after treatment without c/o knee pain. Most of his back and knee pain comes with weightbearing, so PT was unable to assess manual treatment effects. Patient has met 2/3 LTGs at this time. He plans to wait two weeks before return to PT. His next MD appt is 11/12/16.   Rehab Potential Good   PT Frequency 1x / week   PT Duration 4 weeks   PT Treatment/Interventions ADLs/Self Care Home Management;Electrical Stimulation;Moist Heat;Traction;Ultrasound;Therapeutic activities;Therapeutic exercise;Neuromuscular re-education;Manual techniques;Patient/family education;Dry needling;Taping   PT Next Visit Plan Patient to return in 2 weeks, MD appt 11/12/16, injection scheduled for 10/20/16. Continue MFR/STW to L lateral leg. Continue core strengthening, stretching to Lateral leg and gastroc.   PT Home Exercise Plan doorway pec stretch, pelvic rocking, lumbar flexion in chair, seated piriformis stretch; bridging   Consulted and Agree with Plan of Care Patient      Patient will benefit from skilled therapeutic intervention in order to improve the following deficits and impairments:  Postural dysfunction, Impaired flexibility, Decreased range of motion, Decreased activity tolerance, Pain, Abnormal gait  Visit Diagnosis: Acute left-sided low back pain with left-sided sciatica - Plan: PT plan of care cert/re-cert       G-Codes - 11/06/16 1241    Functional Assessment Tool Used (Outpatient Only) FOTO 38% LIMITED   Functional Limitation Other PT primary   Other PT Primary Current Status (C7893) At least 20 percent but less than 40 percent impaired, limited or restricted   Other PT Primary Goal Status (Y1017) At least 20 percent but less than 40 percent impaired, limited or restricted      Problem List Patient Active Problem List   Diagnosis Date Noted  . Arthritis of back 08/25/2016  . AAA (abdominal aortic aneurysm) without rupture ([place]) 07/13/2016  .  Lumbar spondylosis with myelopathy 07/13/2016  . Pre-diabetes 09/18/2015  . Metabolic syndrome 51/07/5850  . Vitamin B 12 deficiency 12/14/2014  . Hereditary and idiopathic peripheral neuropathy 10/17/2014  . Vitamin D deficiency 06/29/2014  . BPH (benign prostatic hyperplasia) 06/29/2014  . Hypertension 11/17/2012  . Hyperlipidemia 11/17/2012  . DDD (degenerative disc disease), lumbar 11/17/2012  . Thrombocytopenia ([place])  [name] PT 10/07/2016, 12:45 PM  [facility] [place], [place], [address] Phone: [phone]   Fax:  [phone]  Name: Layten Aiken MRN: [id number] Date of Birth: 1940-10-18

## 2016-10-16 DIAGNOSIS — M5416 Radiculopathy, lumbar region: Secondary | ICD-10-CM | POA: Diagnosis not present

## 2016-10-16 DIAGNOSIS — M9983 Other biomechanical lesions of lumbar region: Secondary | ICD-10-CM | POA: Diagnosis not present

## 2016-10-21 ENCOUNTER — Ambulatory Visit: Payer: PPO | Attending: Neurosurgery | Admitting: *Deleted

## 2016-10-21 DIAGNOSIS — M5442 Lumbago with sciatica, left side: Secondary | ICD-10-CM | POA: Insufficient documentation

## 2016-10-21 NOTE — Therapy (Signed)
[facility] [place], [place], [address] Phone: [phone]   Fax:  [phone]  Physical Therapy Treatment  Patient Details  Name: Lonnie Hurley MRN: [id number] Date of Birth: 04-16-1941 Referring Provider: Erline Levine MD  Encounter Date: 10/21/2016      PT End of Session - 10/21/16 0951    Visit Number 7   Number of Visits 12   Date for PT Re-Evaluation 11/07/16   PT Start Time 0945   PT Stop Time 1037   PT Time Calculation (min) 52 min      Past Medical History:  Diagnosis Date  . BPH (benign prostatic hypertrophy)    Dr. Rosana Hoes / Dr. Joelyn Oms  - Urologist   . Cancer [facility])    lip-mole surgery  . Cataract   . Colon polyps   . DDD (degenerative disc disease)   . Hearing loss   . Hypercholesteremia   . Hyperlipidemia   . Hypertension   . Leukoplakia   . Microscopic hematuria    negative work up with Urology in the past.   . OAB (overactive bladder)    with past percutaneous tibial nerve stimulation therapy  . Thrombocytopenia ([place])   . Tinnitus   . Tobacco abuse   . Vitamin B 12 deficiency   . Vitamin D deficiency     Past Surgical History:  Procedure Laterality Date  . CATARACT EXTRACTION  2007   right eye  . CATARACT EXTRACTION Left 03/21/15  . COLONOSCOPY    . HERNIA REPAIR  1980  . KNEE SURGERY  02-28-2008   replaced inside right knee  . Left Knee Repair  11-10-2006  . NOSE SURGERY    . POLYPECTOMY    . SKIN BIOPSY  10-08-2006   left ear    There were no vitals filed for this visit.      Subjective Assessment - 10/21/16 0950    Subjective Patient reports soreness in back but not pain. He has 2-3/10 pain in his knee.  To MD 11-12-16. Want to be put on hold until F/U with him   Pertinent History HTN, aortic aneurysm, prediabetic   Diagnostic tests xrays and MRI; showed arthritis throughout the spine   Patient Stated Goals to get exercises  to strengthen him and decrease pain                                       PT Long Term Goals - 10/07/16 0953      PT LONG TERM GOAL #1   Title I with HEP for core strengthening and flexibiltiy.   Period Weeks   Status Achieved     PT LONG TERM GOAL #2   Title Patient able to lie supine or hooklying to sleep with pain less than 2/10.   Baseline Goal met for treatment, but patient hasn't attempted sleeping in bed.   Time 6   Period Weeks   Status On-going     PT LONG TERM GOAL #3   Title Patient to demo improved FOTO to [place] 38% (CJ) as of 10/07/16   Time 6   Period Weeks   Status Achieved               Plan - 10/21/16 1023    Clinical Impression Statement Pt did fairly well today with Therex and manual STW. He reports overall improvement with  LBP, but activities (ADL's) increase his LBP and LT knee pain. He has also had injections in LB that have  helped. He continues to have  tightness and TPs throughout LT ITB, HS and lateral quad.Marland Kitchen He tolerated IASTM and deep MFR to these areas with good realease. He wants to be put on hold until MD F/U. He has met 2/3 LTGs at this time   Rehab Potential Good   PT Frequency 1x / week   PT Duration 4 weeks   PT Treatment/Interventions ADLs/Self Care Home Management;Electrical Stimulation;Moist Heat;Traction;Ultrasound;Therapeutic activities;Therapeutic exercise;Neuromuscular re-education;Manual techniques;Patient/family education;Dry needling;Taping   PT Next Visit Plan  MD appt 11/12/16, Hold on PT until MD F/U as per MD.. Continue MFR/STW to L lateral leg. Continue core strengthening, stretching to Lateral leg and gastroc.   PT Home Exercise Plan doorway pec stretch, pelvic rocking, lumbar flexion in chair, seated piriformis stretch; bridging   Consulted and Agree with Plan of Care Patient      Patient will benefit from skilled therapeutic intervention in order to improve the following deficits and impairments:  Postural  dysfunction, Impaired flexibility, Decreased range of motion, Decreased activity tolerance, Pain, Abnormal gait  Visit Diagnosis: Acute left-sided low back pain with left-sided sciatica     Problem List Patient Active Problem List   Diagnosis Date Noted  . Arthritis of back 08/25/2016  . AAA (abdominal aortic aneurysm) without rupture ([place]) 07/13/2016  . Lumbar spondylosis with myelopathy 07/13/2016  . Pre-diabetes 09/18/2015  . Metabolic syndrome 61/68/3729  . Vitamin B 12 deficiency 12/14/2014  . Hereditary and idiopathic peripheral neuropathy 10/17/2014  . Vitamin D deficiency 06/29/2014  . BPH (benign prostatic hyperplasia) 06/29/2014  . Hypertension 11/17/2012  . Hyperlipidemia 11/17/2012  . DDD (degenerative disc disease), lumbar 11/17/2012  . Thrombocytopenia ([place])     APPLEGATE, [place], PTA 10/22/2016, 1:06 PM Mali Applegate MPT [facility] [address] [place], [place], [address] Phone: [phone]   Fax:  [phone]  Name: Lonnie Hurley MRN: [id number] Date of Birth: 1940-12-25

## 2016-11-07 ENCOUNTER — Ambulatory Visit (INDEPENDENT_AMBULATORY_CARE_PROVIDER_SITE_OTHER): Payer: PPO | Admitting: Nurse Practitioner

## 2016-11-07 ENCOUNTER — Encounter: Payer: Self-pay | Admitting: Nurse Practitioner

## 2016-11-07 VITALS — BP 106/66 | HR 65 | Temp 97.2°F | Ht 65.5 in | Wt 246.0 lb

## 2016-11-07 DIAGNOSIS — W57XXXA Bitten or stung by nonvenomous insect and other nonvenomous arthropods, initial encounter: Secondary | ICD-10-CM

## 2016-11-07 DIAGNOSIS — S40261A Insect bite (nonvenomous) of right shoulder, initial encounter: Secondary | ICD-10-CM

## 2016-11-07 MED ORDER — DOXYCYCLINE HYCLATE 100 MG PO TABS: 100.0000 mg | ORAL_TABLET | Freq: Two times a day (BID) | ORAL | 0 refills | Status: DC

## 2016-11-07 NOTE — Patient Instructions (Signed)
Tick Bite Information Introduction Ticks are insects that attach themselves to the skin. There are many types of ticks. Common types include wood ticks and deer ticks. Sometimes, ticks carry diseases that can make a person very ill. The most common places for ticks to attach themselves are the scalp, neck, armpits, waist, and groin. HOW CAN YOU PREVENT TICK BITES? Take these steps to help prevent tick bites when you are outdoors:  Wear long sleeves and long pants.  Wear white clothes so you can see ticks more easily.  Tuck your pant legs into your socks.  If walking on a trail, stay in the middle of the trail to avoid brushing against bushes.  Avoid walking through areas with long grass.  Put bug spray on all skin that is showing and along boot tops, pant legs, and sleeve cuffs.  Check clothes, hair, and skin often and before going inside.  Brush off any ticks that are not attached.  Take a shower or bath as soon as possible after being outdoors. HOW SHOULD YOU REMOVE A TICK? Ticks should be removed as soon as possible to help prevent diseases. 1. If latex gloves are available, put them on before trying to remove a tick. 2. Use tweezers to grasp the tick as close to the skin as possible. You may also use curved forceps or a tick removal tool. Grasp the tick as close to its head as possible. Avoid grasping the tick on its body. 3. Pull gently upward until the tick lets go. Do not twist the tick or jerk it suddenly. This may break off the tick's head or mouth parts. 4. Do not squeeze or crush the tick's body. This could force disease-carrying fluids from the tick into your body. 5. After the tick is removed, wash the bite area and your hands with soap and water or alcohol. 6. Apply a small amount of antiseptic cream or ointment to the bite site. 7. Wash any tools that were used. Do not try to remove a tick by applying a hot match, petroleum jelly, or fingernail polish to the tick. These  methods do not work. They may also increase the chances of disease being spread from the tick bite. WHEN SHOULD YOU SEEK HELP? Contact your health care provider if you are unable to remove a tick or if a part of the tick breaks off in the skin. After a tick bite, you need to watch for signs and symptoms of diseases that can be spread by ticks. Contact your health care provider if you develop any of the following:  Fever.  Rash.  Redness and puffiness (swelling) in the area of the tick bite.  Tender, puffy lymph glands.  Watery poop (diarrhea).  Weight loss.  Cough.  Feeling more tired than normal (fatigue).  Muscle, joint, or bone pain.  Belly (abdominal) pain.  Headache.  Change in your level of consciousness.  Trouble walking or moving your legs.  Loss of feeling (numbness) in the legs.  Loss of movement (paralysis).  Shortness of breath.  Confusion.  Throwing up (vomiting) many times. This information is not intended to replace advice given to you by your health care provider. Make sure you discuss any questions you have with your health care provider. Document Released: 08/27/2009 Document Revised: 11/08/2015 Document Reviewed: 11/10/2012 Elsevier Interactive Patient Education  2017 Elsevier Inc.  

## 2016-11-07 NOTE — Progress Notes (Signed)
   Subjective:    Patient ID: Lonnie Hurley, male    DOB: 05-01-1941, 76 y.o.   MRN: [id number]  HPI Patient comes in today c/o tick bite- he pulled tick off right posterior shoulder 3 days ago- area is still itching and red. He saved the tick because he wanted [place] to see that had a white spot on it.    Review of Systems  Constitutional: Negative for chills, fatigue and fever.  Respiratory: Negative.   Cardiovascular: Negative.   Gastrointestinal: Negative.   Musculoskeletal: Negative.   Neurological: Negative.   Psychiatric/Behavioral: Negative.   All other systems reviewed and are negative.      Objective:   Physical Exam  Constitutional: He appears well-developed and well-nourished. No distress.  Cardiovascular: Normal rate and regular rhythm.   Pulmonary/Chest: Effort normal and breath sounds normal.  Skin: Skin is warm.  3cm erythematous raised area on right posterior shoulder   BP 106/66   Pulse 65   Temp 97.2 F (36.2 C) (Oral)   Ht 5' 5.5" (1.664 m)   Wt 246 lb (111.6 kg)   BMI 40.31 kg/m        Assessment & Plan:   1. Tick bite, initial encounter    Meds ordered this encounter  Medications  . doxycycline (VIBRA-TABS) 100 MG tablet    Sig: Take 1 tablet (100 mg total) by mouth 2 (two) times daily. 1 po bid    Dispense:  28 tablet    Refill:  0    Order Specific Question:   Supervising Provider    Answer:   Eustaquio Maize [[id number]]   Avoid scratching area Discussed meat allergy and what to watch for RTO prn  Mary-Margaret Hassell Done, FNP

## 2016-11-12 DIAGNOSIS — M545 Low back pain: Secondary | ICD-10-CM | POA: Diagnosis not present

## 2016-11-12 DIAGNOSIS — M9983 Other biomechanical lesions of lumbar region: Secondary | ICD-10-CM | POA: Diagnosis not present

## 2016-11-12 DIAGNOSIS — M4316 Spondylolisthesis, lumbar region: Secondary | ICD-10-CM | POA: Diagnosis not present

## 2016-11-12 DIAGNOSIS — M5416 Radiculopathy, lumbar region: Secondary | ICD-10-CM | POA: Diagnosis not present

## 2016-12-19 ENCOUNTER — Ambulatory Visit: Payer: PPO | Admitting: Family Medicine

## 2016-12-22 ENCOUNTER — Encounter: Payer: Self-pay | Admitting: Family Medicine

## 2016-12-22 ENCOUNTER — Ambulatory Visit (INDEPENDENT_AMBULATORY_CARE_PROVIDER_SITE_OTHER): Payer: PPO | Admitting: Family Medicine

## 2016-12-22 ENCOUNTER — Ambulatory Visit (INDEPENDENT_AMBULATORY_CARE_PROVIDER_SITE_OTHER): Payer: PPO

## 2016-12-22 VITALS — BP 111/67 | HR 90 | Temp 96.5°F | Ht 65.5 in | Wt 245.2 lb

## 2016-12-22 DIAGNOSIS — M79671 Pain in right foot: Secondary | ICD-10-CM

## 2016-12-22 DIAGNOSIS — R5383 Other fatigue: Secondary | ICD-10-CM | POA: Diagnosis not present

## 2016-12-22 DIAGNOSIS — I10 Essential (primary) hypertension: Secondary | ICD-10-CM | POA: Diagnosis not present

## 2016-12-22 DIAGNOSIS — R7303 Prediabetes: Secondary | ICD-10-CM | POA: Diagnosis not present

## 2016-12-22 LAB — BAYER DCA HB A1C WAIVED: HB A1C: 6 % (ref <7.0)

## 2016-12-22 MED ORDER — RAMIPRIL 10 MG PO CAPS: 10.0000 mg | ORAL_CAPSULE | Freq: Every day | ORAL | 3 refills | Status: DC

## 2016-12-22 NOTE — Progress Notes (Signed)
   HPI  Patient presents today here with right heel pain for follow-up of chronic medical conditions.  Right heel pain, 1-2 months. Patient describes that he's got swelling of the Achilles tendon on the right heel, it's very tender to the touch. He would like to see orthopedics in [place]. Denies any discrete injury or difficulty walking.  Low energy Chronically going on, would like his thyroid checked. Would like to consider seeing endocrinology, also has prediabetes, watching diet moderately.  Hypertension Good medication compliance, needs refill of Prempro. No chest pain, dyspnea, palpitations, or leg edema.  PMH: Smoking status noted ROS: Per HPI  Objective: BP 111/67   Pulse 90   Temp (!) 96.5 F (35.8 C) (Oral)   Ht 5' 5.5" (1.664 m)   Wt 245 lb 3.2 oz (111.2 kg)   BMI 40.18 kg/m  Gen: NAD, alert, cooperative with exam HEENT: NCAT CV: RRR, good S1/S2, no murmur Resp: CTABL, no wheezes, non-labored Ext: No edema, warm Neuro: Alert and oriented, No gross deficits MSK:  Unusual swelling located at the distal end of the right Achilles area, tenderness to palpation. No clear tendon laxity or joint laxity, no tenderness of the calf.  Assessment and plan:  # Right heel pain Refer to orthopedics, Dr. Doran Durand Unusual swelling of the distal end of the right Achilles tendon, given persistence I recommended referral.  # Prediabetes Watching diet moderately, repeat A1c today No need for endocrinology referral yet, his brother sees Dr. Buddy Duty  # Low energy Repeat A1c, also TSH Likely multifactorial, consider obesity as well  # Hypertension Well-controlled, continue current medications Labs Refill ramipril     Orders Placed This Encounter  Procedures  . DG Foot Complete Right    Standing Status:   Future    Number of Occurrences:   1    Standing Expiration Date:   02/22/2018    Order Specific Question:   Reason for Exam (SYMPTOM  OR DIAGNOSIS REQUIRED)   Answer:   R heel pain on achilles    Order Specific Question:   Preferred imaging location?    Answer:   Internal    Order Specific Question:   Radiology Contrast Protocol - do NOT remove file path    Answer:   [email or web address]  . TSH  . CMP14+EGFR  . CBC with Differential/Platelet  . Bayer DCA Hb A1c Waived  . Ambulatory referral to Orthopedic Surgery    Referral Priority:   Routine    Referral Type:   Surgical    Referral Reason:   Specialty Services Required    Referred to Provider:   Wylene Simmer, MD    Requested Specialty:   Orthopedic Surgery    Number of Visits Requested:   1    Meds ordered this encounter  Medications  . ramipril (ALTACE) 10 MG capsule    Sig: Take 1 capsule (10 mg total) by mouth daily.    Dispense:  90 capsule    Refill:  [place], MD [place] 12/22/2016, 5:07 PM

## 2016-12-22 NOTE — Patient Instructions (Addendum)
Great to see you!  For heel pain- We have referred you to orthopedics in [place]  For low energy we are checking labs with thyroid

## 2016-12-23 LAB — CBC WITH DIFFERENTIAL/PLATELET
BASOS ABS: 0.1 10*3/uL (ref 0.0–0.2)
BASOS: 1 %
EOS (ABSOLUTE): 0.3 10*3/uL (ref 0.0–0.4)
Eos: 3 %
HEMATOCRIT: 43.4 % (ref 37.5–51.0)
HEMOGLOBIN: 14.5 g/dL (ref 13.0–17.7)
Immature Grans (Abs): 0 10*3/uL (ref 0.0–0.1)
Immature Granulocytes: 0 %
LYMPHS ABS: 3.1 10*3/uL (ref 0.7–3.1)
Lymphs: 34 %
MCH: 29.2 pg (ref 26.6–33.0)
MCHC: 33.4 g/dL (ref 31.5–35.7)
MCV: 88 fL (ref 79–97)
MONOCYTES: 14 %
Monocytes Absolute: 1.3 10*3/uL — ABNORMAL HIGH (ref 0.1–0.9)
NEUTROS ABS: 4.4 10*3/uL (ref 1.4–7.0)
Neutrophils: 48 %
Platelets: 158 10*3/uL (ref 150–379)
RBC: 4.96 x10E6/uL (ref 4.14–5.80)
RDW: 13.4 % (ref 12.3–15.4)
WBC: 9.1 10*3/uL (ref 3.4–10.8)

## 2016-12-23 LAB — CMP14+EGFR
A/G RATIO: 1.4 (ref 1.2–2.2)
ALBUMIN: 3.9 g/dL (ref 3.5–4.8)
ALT: 18 IU/L (ref 0–44)
AST: 21 IU/L (ref 0–40)
Alkaline Phosphatase: 52 IU/L (ref 39–117)
BUN / CREAT RATIO: 13 (ref 10–24)
BUN: 14 mg/dL (ref 8–27)
Bilirubin Total: 0.6 mg/dL (ref 0.0–1.2)
CO2: 26 mmol/L (ref 20–29)
Calcium: 9.9 mg/dL (ref 8.6–10.2)
Chloride: 104 mmol/L (ref 96–106)
Creatinine, Ser: 1.05 mg/dL (ref 0.76–1.27)
GFR, EST AFRICAN AMERICAN: 81 mL/min/{1.73_m2} (ref >59)
GFR, EST NON AFRICAN AMERICAN: 70 mL/min/{1.73_m2} (ref >59)
Globulin, Total: 2.7 g/dL (ref 1.5–4.5)
Glucose: 119 mg/dL — ABNORMAL HIGH (ref 65–99)
POTASSIUM: 4.2 mmol/L (ref 3.5–5.2)
Sodium: 144 mmol/L (ref 134–144)
TOTAL PROTEIN: 6.6 g/dL (ref 6.0–8.5)

## 2016-12-23 LAB — TSH: TSH: 1.73 u[IU]/mL (ref 0.450–4.500)

## 2017-01-01 DIAGNOSIS — N401 Enlarged prostate with lower urinary tract symptoms: Secondary | ICD-10-CM | POA: Diagnosis not present

## 2017-01-02 ENCOUNTER — Encounter: Payer: Self-pay | Admitting: *Deleted

## 2017-01-13 DIAGNOSIS — N401 Enlarged prostate with lower urinary tract symptoms: Secondary | ICD-10-CM | POA: Diagnosis not present

## 2017-01-13 DIAGNOSIS — R3121 Asymptomatic microscopic hematuria: Secondary | ICD-10-CM | POA: Diagnosis not present

## 2017-01-13 DIAGNOSIS — N3941 Urge incontinence: Secondary | ICD-10-CM | POA: Diagnosis not present

## 2017-01-13 DIAGNOSIS — R351 Nocturia: Secondary | ICD-10-CM | POA: Diagnosis not present

## 2017-02-11 DIAGNOSIS — M7661 Achilles tendinitis, right leg: Secondary | ICD-10-CM | POA: Diagnosis not present

## 2017-02-19 ENCOUNTER — Ambulatory Visit: Payer: PPO | Attending: Student | Admitting: Physical Therapy

## 2017-02-19 ENCOUNTER — Encounter: Payer: Self-pay | Admitting: Physical Therapy

## 2017-02-19 DIAGNOSIS — M79671 Pain in right foot: Secondary | ICD-10-CM | POA: Insufficient documentation

## 2017-02-19 DIAGNOSIS — R262 Difficulty in walking, not elsewhere classified: Secondary | ICD-10-CM | POA: Insufficient documentation

## 2017-02-19 DIAGNOSIS — M5442 Lumbago with sciatica, left side: Secondary | ICD-10-CM | POA: Insufficient documentation

## 2017-02-19 NOTE — Patient Instructions (Signed)
  2 times each day   Hamstring stretches: Hold 30 seconds Repeat 5 times

## 2017-02-19 NOTE — Therapy (Signed)
[facility] [place], [place], [address] Phone: [phone]   Fax:  [phone]  Physical Therapy Evaluation  Patient Details  Name: Lonnie Hurley MRN: [id number] Date of Birth: Jan 15, 1941 Referring Provider: Mechele Claude, PA  Encounter Date: 02/19/2017      PT End of Session - 02/19/17 1256    Visit Number 1   Number of Visits 12   Date for PT Re-Evaluation 04/21/17   PT Start Time 0945   PT Stop Time 1030   PT Time Calculation (min) 45 min   Activity Tolerance Patient tolerated treatment well   Behavior During Therapy [facility] for tasks assessed/performed      Past Medical History:  Diagnosis Date  . BPH (benign prostatic hypertrophy)    Dr. Rosana Hoes / Dr. Joelyn Oms  - Urologist   . Cancer [facility])    lip-mole surgery  . Cataract   . Colon polyps   . DDD (degenerative disc disease)   . Hearing loss   . Hypercholesteremia   . Hyperlipidemia   . Hypertension   . Leukoplakia   . Microscopic hematuria    negative work up with Urology in the past.   . OAB (overactive bladder)    with past percutaneous tibial nerve stimulation therapy  . Thrombocytopenia ([place])   . Tinnitus   . Tobacco abuse   . Vitamin B 12 deficiency   . Vitamin D deficiency     Past Surgical History:  Procedure Laterality Date  . CATARACT EXTRACTION  2007   right eye  . CATARACT EXTRACTION Left 03/21/15  . COLONOSCOPY    . HERNIA REPAIR  1980  . KNEE SURGERY  02-28-2008   replaced inside right knee  . Left Knee Repair  11-10-2006  . NOSE SURGERY    . POLYPECTOMY    . SKIN BIOPSY  10-08-2006   left ear    There were no vitals filed for this visit.       Subjective Assessment - 02/19/17 1245    Subjective Patient arriving to therapy reporting right foot pain with tingling at times. Pt reporting pain of 5/10.    Pertinent History HTN, aortic aneurysm, prediabetic   Diagnostic tests xrays and MRI; showed arthritis throughout the spine, X-ray  showed right heel spurs   Patient Stated Goals Stop hurting, walk without pain   Currently in Pain? Yes   Pain Score 5    Pain Location Foot  heel   Pain Orientation Right   Pain Descriptors / Indicators Sore;Tingling   Pain Type Acute pain   Pain Radiating Towards toes   Pain Onset More than a month ago   Pain Frequency Intermittent   Aggravating Factors  standing, walking   Pain Relieving Factors epsom salt baths, ice   Effect of Pain on Daily Activities limited with walking and household tasks            [facility] PT Assessment - 02/19/17 0001      Assessment   Medical Diagnosis left foot pain/ bone spurs   Referring Provider Mechele Claude, PA   Onset Date/Surgical Date 01/14/17     Precautions   Precaution Comments no heavy lifting/avoid strenous activities     Balance Screen   Has the patient fallen in the past 6 months No   Has the patient had a decrease in activity level because of a fear of falling?  No   Is the patient reluctant to leave their home because of a fear  of falling?  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 assessed     Observation/Other Assessments   Focus on Therapeutic Outcomes (FOTO)  51% limitaion     ROM / Strength   AROM / PROM / Strength AROM;Strength     AROM   AROM Assessment Site Ankle   Right/Left Ankle Right   Right Ankle Dorsiflexion 5   Right Ankle Plantar Flexion 10   Right Ankle Inversion 15   Right Ankle Eversion 15     Transfers   Five time sit to stand comments  32 seconds with UE support     Ambulation/Gait   Ambulation/Gait Yes   Ambulation/Gait Assistance 6: Modified independent (Device/Increase time)  increased time   Ambulation Distance (Feet) 30 Feet   Assistive device None   Gait Pattern Decreased step length - right;Antalgic;Wide base of support   Ambulation Surface Level;Indoor             Objective measurements completed on examination: See above findings.                  PT Education - 02/19/17 1255    Education provided Yes   Education Details HEP, rolling with frozen bottle of water under right foot.    Person(s) Educated Patient   Methods Explanation;Demonstration   Comprehension Verbalized understanding;Returned demonstration             PT Long Term Goals - 10/07/16 0953      PT LONG TERM GOAL #1   Title I with HEP for core strengthening and flexibiltiy.   Period Weeks   Status Achieved     PT LONG TERM GOAL #2   Title Patient able to lie supine or hooklying to sleep with pain less than 2/10.   Baseline Goal met for treatment, but patient hasn't attempted sleeping in bed.   Time 6   Period Weeks   Status On-going     PT LONG TERM GOAL #3   Title Patient to demo improved FOTO to [place] 38% (CJ) as of 10/07/16   Time 6   Period Weeks   Status Achieved                Plan - 02/19/17 1258    Clinical Impression Statement Patient arriving to therapy today as a moderate complexity evaulation with history of low back pain 4 months ago and was receiving therapy until pt was placed on hold by MD. Pt had spinal injections which have seemed to help. Pt arriving today with multiple co-morbidities complaining of right foot pain due to heel spurs. Pt with limited functional mobility and gait due to foot pain. pt with antalgic gait pattern. Iontophoresis applied to foot, pt edu to take patch off after 4 hours. Pt verbally restated his instructions. Skilled PT needed to address pt's impairments with the below interventions.    History and Personal Factors relevant to plan of care: h/o LBP, HTN, Prediabetes   Clinical Presentation Stable   Rehab Potential Good   PT Frequency 2x / week   PT Duration 6 weeks   PT Treatment/Interventions ADLs/Self Care Home Management;Electrical Stimulation;Moist  Heat;Traction;Ultrasound;Therapeutic activities;Therapeutic exercise;Neuromuscular re-education;Manual techniques;Patient/family education;Dry needling;Taping;Iontophoresis 17m/ml Dexamethasone;Gait training;Stair training;Functional mobility training   PT Next Visit Plan [place], stretching, ankle exercises   PT Home Exercise Plan ABC's for  right ankle, hamstring stretching   Consulted and Agree with Plan of Care Patient      Patient will benefit from skilled therapeutic intervention in order to improve the following deficits and impairments:  Postural dysfunction, Impaired flexibility, Decreased range of motion, Decreased activity tolerance, Pain, Abnormal gait  Visit Diagnosis: Difficulty in walking, not elsewhere classified      G-Codes - 2017/03/13 1430    Functional Assessment Tool Used (Outpatient Only) FOTO 51 % limited, clinical assessment   Functional Limitation Mobility: Walking and moving around   Mobility: Walking and Moving Around Current Status [phone]) At least 40 percent but less than 60 percent impaired, limited or restricted   Mobility: Walking and Moving Around Goal Status [phone]) At least 20 percent but less than 40 percent impaired, limited or restricted       Problem List Patient Active Problem List   Diagnosis Date Noted  . Arthritis of back 08/25/2016  . AAA (abdominal aortic aneurysm) without rupture ([place]) 07/13/2016  . Lumbar spondylosis with myelopathy 07/13/2016  . Pre-diabetes 09/18/2015  . Metabolic syndrome 85/99/2341  . Vitamin B 12 deficiency 12/14/2014  . Hereditary and idiopathic peripheral neuropathy 10/17/2014  . Vitamin D deficiency 06/29/2014  . BPH (benign prostatic hyperplasia) 06/29/2014  . Hypertension 11/17/2012  . Hyperlipidemia 11/17/2012  . DDD (degenerative disc disease), lumbar 11/17/2012  . Thrombocytopenia ([place])     Oretha Caprice, MPT 2017-03-13, 2:34 PM  [facility] [place], [place], [address] Phone: [phone]   Fax:  [phone]  Name: Tell Rozelle MRN: [id number] Date of Birth: 21-May-1941

## 2017-02-24 ENCOUNTER — Ambulatory Visit: Payer: PPO | Admitting: Physical Therapy

## 2017-02-24 DIAGNOSIS — R262 Difficulty in walking, not elsewhere classified: Secondary | ICD-10-CM

## 2017-02-24 DIAGNOSIS — M79671 Pain in right foot: Secondary | ICD-10-CM

## 2017-02-24 NOTE — Patient Instructions (Signed)
  Achilles Tendon Stretch   Stand with hands supported on wall, elbows slightly bent, feet parallel and both heels on floor, front knee bent, back knee straight. Slowly relax back knee until a stretch is felt in achilles tendon. Hold __30_ seconds. Repeat with leg positions switched. Repeat with back knee slightly bent.   [name], PT 02/24/17 9:40 AM [facility] [address] [place], [place], [address] Phone: [phone]   Fax:  [phone]

## 2017-02-24 NOTE — Therapy (Signed)
[facility] [place], [place], [address] Phone: [phone]   Fax:  [phone]  Physical Therapy Treatment  Patient Details  Name: Lonnie Hurley MRN: [id number] Date of Birth: 1940/09/20 Referring Provider: Mechele Claude, PA  Encounter Date: 02/24/2017      PT End of Session - 02/24/17 0900    Visit Number 2   Number of Visits 12   Date for PT Re-Evaluation 04/21/17   PT Start Time 0901   PT Stop Time 0946   PT Time Calculation (min) 45 min   Activity Tolerance Patient tolerated treatment well   Behavior During Therapy [facility] for tasks assessed/performed      Past Medical History:  Diagnosis Date  . BPH (benign prostatic hypertrophy)    Dr. Rosana Hoes / Dr. Joelyn Oms  - Urologist   . Cancer [facility])    lip-mole surgery  . Cataract   . Colon polyps   . DDD (degenerative disc disease)   . Hearing loss   . Hypercholesteremia   . Hyperlipidemia   . Hypertension   . Leukoplakia   . Microscopic hematuria    negative work up with Urology in the past.   . OAB (overactive bladder)    with past percutaneous tibial nerve stimulation therapy  . Thrombocytopenia ([place])   . Tinnitus   . Tobacco abuse   . Vitamin B 12 deficiency   . Vitamin D deficiency     Past Surgical History:  Procedure Laterality Date  . CATARACT EXTRACTION  2007   right eye  . CATARACT EXTRACTION Left 03/21/15  . COLONOSCOPY    . HERNIA REPAIR  1980  . KNEE SURGERY  02-28-2008   replaced inside right knee  . Left Knee Repair  11-10-2006  . NOSE SURGERY    . POLYPECTOMY    . SKIN BIOPSY  10-08-2006   left ear    There were no vitals filed for this visit.      Subjective Assessment - 02/24/17 0901    Subjective Patient reports a little relief since last treatment. Ice is the most effective right now.   Pertinent History HTN, aortic aneurysm, prediabetic   Diagnostic tests xrays and MRI; showed arthritis throughout the spine, X-ray showed right heel  spurs   Patient Stated Goals Stop hurting, walk without pain   Currently in Pain? Yes   Pain Score 3    Pain Location Foot   Pain Orientation Right   Pain Descriptors / Indicators Sore   Pain Type Acute pain            [facility] PT Assessment - 02/24/17 0001      Assessment   Medical Diagnosis Right foot pain/bone spurs                     [facility] Adult PT Treatment/Exercise - 02/24/17 0001      Exercises   Exercises Ankle     Modalities   Modalities Iontophoresis     Iontophoresis   Type of Iontophoresis Dexamethasone   Location R post heel   Dose 1 ml, stat patch   Time 4 hours     Manual Therapy   Manual Therapy Soft tissue mobilization   Soft tissue mobilization to R gastroc/soleus      Ankle Exercises: Stretches   Gastroc Stretch 2 reps;30 seconds   Gastroc Stretch Limitations standing   Other Stretch seated soleus stretch with strap x 30 seconds     Ankle Exercises:  Seated   Ankle Circles/Pumps AROM;20 reps  Bil   Other Seated Ankle Exercises rockerboard DF/PF x 2 min     Ankle Exercises: Supine   Other Supine Ankle Exercises ankle pumps                     PT Long Term Goals - 10/07/16 0953      PT LONG TERM GOAL #1   Title I with HEP for core strengthening and flexibiltiy.   Period Weeks   Status Achieved     PT LONG TERM GOAL #2   Title Patient able to lie supine or hooklying to sleep with pain less than 2/10.   Baseline Goal met for treatment, but patient hasn't attempted sleeping in bed.   Time 6   Period Weeks   Status On-going     PT LONG TERM GOAL #3   Title Patient to demo improved FOTO to [place] 38% (CJ) as of 10/07/16   Time 6   Period Weeks   Status Achieved               Plan - 02/24/17 1324    Clinical Impression Statement Patient did well with PT today. He needs continual cueing to stay on task. He responded well to STW in gastroc/soleus. Ionto Patch #2 applied. No goals met as only  second visit.   PT Frequency 2x / week   PT Duration 6 weeks   PT Treatment/Interventions ADLs/Self Care Home Management;Electrical Stimulation;Moist Heat;Traction;Ultrasound;Therapeutic activities;Therapeutic exercise;Neuromuscular re-education;Manual techniques;Patient/family education;Dry needling;Taping;Iontophoresis 93m/ml Dexamethasone;Gait training;Stair training;Functional mobility training   PT Next Visit Plan Ionto, stretching, ankle exercises   PT Home Exercise Plan ABC's for right ankle, hamstring stretching; standing gastroc stretch   Consulted and Agree with Plan of Care Patient      Patient will benefit from skilled therapeutic intervention in order to improve the following deficits and impairments:  Postural dysfunction, Impaired flexibility, Decreased range of motion, Decreased activity tolerance, Pain, Abnormal gait  Visit Diagnosis: Difficulty in walking, not elsewhere classified  Pain in right foot     Problem List Patient Active Problem List   Diagnosis Date Noted  . Arthritis of back 08/25/2016  . AAA (abdominal aortic aneurysm) without rupture ([place] 07/13/2016  . Lumbar spondylosis with myelopathy 07/13/2016  . Pre-diabetes 09/18/2015  . Metabolic syndrome 134/37/3578 . Vitamin B 12 deficiency 12/14/2014  . Hereditary and idiopathic peripheral neuropathy 10/17/2014  . Vitamin D deficiency 06/29/2014  . BPH (benign prostatic hyperplasia) 06/29/2014  . Hypertension 11/17/2012  . Hyperlipidemia 11/17/2012  . DDD (degenerative disc disease), lumbar 11/17/2012  . [place]rombocytopenia ([place]     JMadelyn FlavorsPT 02/24/2017, 1:27 PM  [facility] [address] [address]Phone: [phone]  Fax:  [phone] Name: Lonnie LosMRN: [id number] of Birth: 111-15-1942

## 2017-02-26 ENCOUNTER — Ambulatory Visit: Payer: PPO | Admitting: *Deleted

## 2017-02-26 DIAGNOSIS — M79671 Pain in right foot: Secondary | ICD-10-CM

## 2017-02-26 DIAGNOSIS — R262 Difficulty in walking, not elsewhere classified: Secondary | ICD-10-CM

## 2017-02-26 NOTE — Therapy (Signed)
[facility] [place], [place], [address] Phone: [phone]   Fax:  [phone]  Physical Therapy Treatment  Patient Details  Name: Lonnie Hurley MRN: [id number] Date of Birth: 05/24/1941 Referring Provider: Mechele Claude, PA  Encounter Date: 02/26/2017      PT End of Session - 02/26/17 0950    Visit Number 3   Number of Visits 12   Date for PT Re-Evaluation 04/21/17   PT Start Time 0945   PT Stop Time 8250   PT Time Calculation (min) 50 min      Past Medical History:  Diagnosis Date  . BPH (benign prostatic hypertrophy)    Dr. Rosana Hoes / Dr. Joelyn Oms  - Urologist   . Cancer [facility])    lip-mole surgery  . Cataract   . Colon polyps   . DDD (degenerative disc disease)   . Hearing loss   . Hypercholesteremia   . Hyperlipidemia   . Hypertension   . Leukoplakia   . Microscopic hematuria    negative work up with Urology in the past.   . OAB (overactive bladder)    with past percutaneous tibial nerve stimulation therapy  . Thrombocytopenia ([place])   . Tinnitus   . Tobacco abuse   . Vitamin B 12 deficiency   . Vitamin D deficiency     Past Surgical History:  Procedure Laterality Date  . CATARACT EXTRACTION  2007   right eye  . CATARACT EXTRACTION Left 03/21/15  . COLONOSCOPY    . HERNIA REPAIR  1980  . KNEE SURGERY  02-28-2008   replaced inside right knee  . Left Knee Repair  11-10-2006  . NOSE SURGERY    . POLYPECTOMY    . SKIN BIOPSY  10-08-2006   left ear    There were no vitals filed for this visit.                       [facility] Adult PT Treatment/Exercise - 02/26/17 0001      Exercises   Exercises Ankle     Modalities   Modalities Iontophoresis;Moist Heat;Electrical Stimulation     Moist Heat Therapy   Number Minutes Moist Heat 15 Minutes   Moist Heat Location Ankle     Electrical Stimulation   Electrical Stimulation Location Premod x 15 mins 80-150 hz  RT heel posterior aspect   Electrical Stimulation Goals Pain     Iontophoresis   Type of Iontophoresis Dexamethasone   Location R post heel   Dose 1 ml, stat patch   Time 4 hours     Manual Therapy   Manual Therapy Soft tissue mobilization   Soft tissue mobilization to R gastroc/soleus      Ankle Exercises: Stretches   Other Stretch seated soleus stretch with strap x 30 seconds x mins total     Ankle Exercises: Seated   Other Seated Ankle Exercises rockerboard DF/PF x 5 min                     PT Long Term Goals - 10/07/16 5397      PT LONG TERM GOAL #1   Title I with HEP for core strengthening and flexibiltiy.   Period Weeks   Status Achieved     PT LONG TERM GOAL #2   Title Patient able to lie supine or hooklying to sleep with pain less than 2/10.   Baseline Goal met for treatment, but patient hasn't attempted  sleeping in bed.   Time 6   Period Weeks   Status On-going     PT LONG TERM GOAL #3   Title Patient to demo improved FOTO to [place] 38% (CJ) as of 10/07/16   Time 6   Period Weeks   Status Achieved               Plan - 02/26/17 1027    Clinical Impression Statement Pt arrived today doing a little better with less pain in RT. heel after last Rx. He still had notable tightness/soreness during STW. He responded well to Rx today and had less pain after Modalities. IONTO patch applied.   Rehab Potential Good   PT Frequency 2x / week   PT Treatment/Interventions ADLs/Self Care Home Management;Electrical Stimulation;Moist Heat;Traction;Ultrasound;Therapeutic activities;Therapeutic exercise;Neuromuscular re-education;Manual techniques;Patient/family education;Dry needling;Taping;Iontophoresis 48m/ml Dexamethasone;Gait training;Stair training;Functional mobility training   PT Next Visit Plan Ionto, stretching, ankle exercises   PT Home Exercise Plan ABC's for right ankle, hamstring stretching; standing gastroc stretch   Consulted and Agree with Plan of Care  Patient      Patient will benefit from skilled therapeutic intervention in order to improve the following deficits and impairments:  Postural dysfunction, Impaired flexibility, Decreased range of motion, Decreased activity tolerance, Pain, Abnormal gait  Visit Diagnosis: Difficulty in walking, not elsewhere classified  Pain in right foot     Problem List Patient Active Problem List   Diagnosis Date Noted  . Arthritis of back 08/25/2016  . AAA (abdominal aortic aneurysm) without rupture ([place] 07/13/2016  . Lumbar spondylosis with myelopathy 07/13/2016  . Pre-diabetes 09/18/2015  . Metabolic syndrome 110/40/4591 . Vitamin B 12 deficiency 12/14/2014  . Hereditary and idiopathic peripheral neuropathy 10/17/2014  . Vitamin D deficiency 06/29/2014  . BPH (benign prostatic hyperplasia) 06/29/2014  . Hypertension 11/17/2012  . Hyperlipidemia 11/17/2012  . DDD (degenerative disc disease), lumbar 11/17/2012  . Thrombocytopenia ([place]     Gideon Burstein,CHRIS, PTA 02/26/2017, 10:44 AM  [facility] [address]Phone: [phone]  Fax:  [phone] Name: LJaekwon MccluneMRN: [id number] of Birth: 111-09-1940

## 2017-03-03 ENCOUNTER — Ambulatory Visit: Payer: PPO | Admitting: Physical Therapy

## 2017-03-03 DIAGNOSIS — M79671 Pain in right foot: Secondary | ICD-10-CM

## 2017-03-03 DIAGNOSIS — R262 Difficulty in walking, not elsewhere classified: Secondary | ICD-10-CM | POA: Diagnosis not present

## 2017-03-03 NOTE — Therapy (Signed)
[facility] [place], [place], [address] Phone: [phone]   Fax:  [phone]  Physical Therapy Treatment  Patient Details  Name: Lonnie Hurley MRN: [id number] Date of Birth: October 13, 1940 Referring Provider: Mechele Claude, PA  Encounter Date: 03/03/2017      PT End of Session - 03/03/17 0900    Visit Number 4   Number of Visits 12   Date for PT Re-Evaluation 04/21/17   PT Start Time 0900   PT Stop Time 0948   PT Time Calculation (min) 48 min   Activity Tolerance Patient tolerated treatment well   Behavior During Therapy [facility] for tasks assessed/performed      Past Medical History:  Diagnosis Date  . BPH (benign prostatic hypertrophy)    Dr. Rosana Hoes / Dr. Joelyn Oms  - Urologist   . Cancer [facility])    lip-mole surgery  . Cataract   . Colon polyps   . DDD (degenerative disc disease)   . Hearing loss   . Hypercholesteremia   . Hyperlipidemia   . Hypertension   . Leukoplakia   . Microscopic hematuria    negative work up with Urology in the past.   . OAB (overactive bladder)    with past percutaneous tibial nerve stimulation therapy  . Thrombocytopenia ([place])   . Tinnitus   . Tobacco abuse   . Vitamin B 12 deficiency   . Vitamin D deficiency     Past Surgical History:  Procedure Laterality Date  . CATARACT EXTRACTION  2007   right eye  . CATARACT EXTRACTION Left 03/21/15  . COLONOSCOPY    . HERNIA REPAIR  1980  . KNEE SURGERY  02-28-2008   replaced inside right knee  . Left Knee Repair  11-10-2006  . NOSE SURGERY    . POLYPECTOMY    . SKIN BIOPSY  10-08-2006   left ear    There were no vitals filed for this visit.      Subjective Assessment - 03/03/17 0901    Subjective Patient states the pain is minimal unless he puts the pressure on it.   Pertinent History HTN, aortic aneurysm, prediabetic   Diagnostic tests xrays and MRI; showed arthritis throughout the spine, X-ray showed right heel spurs   Patient  Stated Goals Stop hurting, walk without pain   Currently in Pain? Yes   Pain Score 1    Pain Location Foot   Pain Orientation Right   Pain Type Acute pain                         [facility] Adult PT Treatment/Exercise - 03/03/17 0001      Modalities   Modalities Iontophoresis     Iontophoresis   Type of Iontophoresis Dexamethasone   Location R post heel   Dose 1 ml, stat patch   Time 4 hours     Manual Therapy   Manual Therapy Joint mobilization;Soft tissue mobilization;Myofascial release;Passive ROM   Joint Mobilization cuboid; calcaneal   Soft tissue mobilization to distal soleus   Myofascial Release to R plantar fascia and achilles tendon   Passive ROM into DF 5 x 60 seconds in supine on bolster     Ankle Exercises: Seated   Ankle Circles/Pumps Strengthening;Right;20 reps  circles/pumps/pnf D1/D2 x 20 each   BAPS Level 2;Sitting  attempted but patient unable to do w/o hip involvement   Other Seated Ankle Exercises rockerboard DF/PF x 5 min     Ankle  Exercises: Supine   Other Supine Ankle Exercises PNF AAROM x 10 each D1/D2     Ankle Exercises: Stretches   Gastroc Stretch 2 reps;60 seconds                            Plan - 03/03/17 1104    Clinical Impression Statement Patient continues to report improvement with heel pain. Pain is mainly with pressure right to post heel. He did well with TE but requires continual verbal and tactile cues to stay on task and do correctly. Ionto #4/6 applied.   PT Treatment/Interventions ADLs/Self Care Home Management;Electrical Stimulation;Moist Heat;Traction;Ultrasound;Therapeutic activities;Therapeutic exercise;Neuromuscular re-education;Manual techniques;Patient/family education;Dry needling;Taping;Iontophoresis 4mg /ml Dexamethasone;Gait training;Stair training;Functional mobility training   PT Next Visit Plan Ionto, stretching, ankle exercises   PT Home Exercise Plan ABC's for right ankle, hamstring  stretching; standing gastroc stretch, tennis ball for MFR of PF      Patient will benefit from skilled therapeutic intervention in order to improve the following deficits and impairments:  Postural dysfunction, Impaired flexibility, Decreased range of motion, Decreased activity tolerance, Pain, Abnormal gait  Visit Diagnosis: Pain in right foot     Problem List Patient Active Problem List   Diagnosis Date Noted  . Arthritis of back 08/25/2016  . AAA (abdominal aortic aneurysm) without rupture ([place]) 07/13/2016  . Lumbar spondylosis with myelopathy 07/13/2016  . Pre-diabetes 09/18/2015  . Metabolic syndrome 64/40/3474  . Vitamin B 12 deficiency 12/14/2014  . Hereditary and idiopathic peripheral neuropathy 10/17/2014  . Vitamin D deficiency 06/29/2014  . BPH (benign prostatic hyperplasia) 06/29/2014  . Hypertension 11/17/2012  . Hyperlipidemia 11/17/2012  . DDD (degenerative disc disease), lumbar 11/17/2012  . Thrombocytopenia ([place])     [name] PT 03/03/2017, 11:09 AM  [facility] [address] [place], [place], [address] Phone: [phone]   Fax:  [phone]  Name: Deigo Alonso MRN: [id number] Date of Birth: 1940/11/15

## 2017-03-05 ENCOUNTER — Ambulatory Visit: Payer: PPO | Admitting: *Deleted

## 2017-03-05 DIAGNOSIS — R262 Difficulty in walking, not elsewhere classified: Secondary | ICD-10-CM | POA: Diagnosis not present

## 2017-03-05 DIAGNOSIS — M79671 Pain in right foot: Secondary | ICD-10-CM

## 2017-03-05 NOTE — Therapy (Signed)
[facility] [place], [place], [address] Phone: [phone]   Fax:  [phone]  Physical Therapy Treatment  Patient Details  Name: Lonnie Hurley MRN: [id number] Date of Birth: 1940/11/11 Referring Provider: Mechele Claude, PA  Encounter Date: 03/05/2017      PT End of Session - 03/05/17 1209    Visit Number 5   Number of Visits 12   Date for PT Re-Evaluation 04/21/17   PT Start Time 1115   PT Stop Time 1206   PT Time Calculation (min) 51 min   Activity Tolerance Patient tolerated treatment well   Behavior During Therapy [facility] for tasks assessed/performed      Past Medical History:  Diagnosis Date  . BPH (benign prostatic hypertrophy)    Dr. Rosana Hoes / Dr. Joelyn Oms  - Urologist   . Cancer [facility])    lip-mole surgery  . Cataract   . Colon polyps   . DDD (degenerative disc disease)   . Hearing loss   . Hypercholesteremia   . Hyperlipidemia   . Hypertension   . Leukoplakia   . Microscopic hematuria    negative work up with Urology in the past.   . OAB (overactive bladder)    with past percutaneous tibial nerve stimulation therapy  . Thrombocytopenia ([place])   . Tinnitus   . Tobacco abuse   . Vitamin B 12 deficiency   . Vitamin D deficiency     Past Surgical History:  Procedure Laterality Date  . CATARACT EXTRACTION  2007   right eye  . CATARACT EXTRACTION Left 03/21/15  . COLONOSCOPY    . HERNIA REPAIR  1980  . KNEE SURGERY  02-28-2008   replaced inside right knee  . Left Knee Repair  11-10-2006  . NOSE SURGERY    . POLYPECTOMY    . SKIN BIOPSY  10-08-2006   left ear    There were no vitals filed for this visit.      Subjective Assessment - 03/05/17 1127    Subjective Patient states the pain is minimal unless he puts the pressure on it.   Pertinent History HTN, aortic aneurysm, prediabetic   Diagnostic tests xrays and MRI; showed arthritis throughout the spine, X-ray showed right heel spurs   Patient  Stated Goals Stop hurting, walk without pain   Currently in Pain? Yes   Pain Score 3    Pain Location Foot   Pain Orientation Right   Pain Descriptors / Indicators Sore   Pain Type Acute pain   Pain Onset More than a month ago                         [facility] Adult PT Treatment/Exercise - 03/05/17 0001      Exercises   Exercises Ankle     Modalities   Modalities Iontophoresis     Moist Heat Therapy   Number Minutes Moist Heat 15 Minutes   Moist Heat Location Ankle     Electrical Stimulation   Electrical Stimulation Location Premod x 15 mins 80-150 hz  RT heel posterior aspect   Electrical Stimulation Goals Pain     Iontophoresis   Type of Iontophoresis Dexamethasone   Location R post heel   Dose 1 ml, stat patch   Time 4 hours     Manual Therapy   Manual Therapy Joint mobilization;Soft tissue mobilization;Myofascial release;Passive ROM   Soft tissue mobilization STW and IASTM to distal soleus/gastroc/achilles tendon with and without  stretch     Ankle Exercises: Seated   Other Seated Ankle Exercises rockerboard DF/PF x 5 min/ EV,INV x 5 mins                            Plan - 03/05/17 1158    Clinical Impression Statement Pt arrived to clinic feeling better with less pain in RT heel/foot. He was able to perform AROM exs easiere today and has increased ROM for Eversion now. Ionto #5/6 today   Rehab Potential Good   PT Frequency 2x / week   PT Duration 6 weeks   PT Treatment/Interventions ADLs/Self Care Home Management;Electrical Stimulation;Moist Heat;Traction;Ultrasound;Therapeutic activities;Therapeutic exercise;Neuromuscular re-education;Manual techniques;Patient/family education;Dry needling;Taping;Iontophoresis 4mg /ml Dexamethasone;Gait training;Stair training;Functional mobility training   PT Next Visit Plan Ionto, stretching, ankle exercises   PT Home Exercise Plan ABC's for right ankle, hamstring stretching; standing gastroc  stretch, tennis ball for MFR of PF   Consulted and Agree with Plan of Care Patient      Patient will benefit from skilled therapeutic intervention in order to improve the following deficits and impairments:  Postural dysfunction, Impaired flexibility, Decreased range of motion, Decreased activity tolerance, Pain, Abnormal gait  Visit Diagnosis: Pain in right foot  Difficulty in walking, not elsewhere classified     Problem List Patient Active Problem List   Diagnosis Date Noted  . Arthritis of back 08/25/2016  . AAA (abdominal aortic aneurysm) without rupture ([place]) 07/13/2016  . Lumbar spondylosis with myelopathy 07/13/2016  . Pre-diabetes 09/18/2015  . Metabolic syndrome 16/60/6301  . Vitamin B 12 deficiency 12/14/2014  . Hereditary and idiopathic peripheral neuropathy 10/17/2014  . Vitamin D deficiency 06/29/2014  . BPH (benign prostatic hyperplasia) 06/29/2014  . Hypertension 11/17/2012  . Hyperlipidemia 11/17/2012  . DDD (degenerative disc disease), lumbar 11/17/2012  . Thrombocytopenia ([place])     Chrisha Vogel,CHRIS, PTA 03/05/2017, 12:10 PM  [facility] [place], [place], [address] Phone: [phone]   Fax:  [phone]  Name: Lonnie Hurley MRN: [id number] Date of Birth: 09/19/1940

## 2017-03-09 DIAGNOSIS — L570 Actinic keratosis: Secondary | ICD-10-CM | POA: Diagnosis not present

## 2017-03-09 DIAGNOSIS — L812 Freckles: Secondary | ICD-10-CM | POA: Diagnosis not present

## 2017-03-09 DIAGNOSIS — D1801 Hemangioma of skin and subcutaneous tissue: Secondary | ICD-10-CM | POA: Diagnosis not present

## 2017-03-09 DIAGNOSIS — Z85828 Personal history of other malignant neoplasm of skin: Secondary | ICD-10-CM | POA: Diagnosis not present

## 2017-03-09 DIAGNOSIS — L821 Other seborrheic keratosis: Secondary | ICD-10-CM | POA: Diagnosis not present

## 2017-03-10 ENCOUNTER — Ambulatory Visit: Payer: PPO | Admitting: Physical Therapy

## 2017-03-10 ENCOUNTER — Encounter: Payer: Self-pay | Admitting: Physical Therapy

## 2017-03-10 DIAGNOSIS — M79671 Pain in right foot: Secondary | ICD-10-CM

## 2017-03-10 DIAGNOSIS — M5442 Lumbago with sciatica, left side: Secondary | ICD-10-CM

## 2017-03-10 DIAGNOSIS — R262 Difficulty in walking, not elsewhere classified: Secondary | ICD-10-CM | POA: Diagnosis not present

## 2017-03-10 NOTE — Therapy (Signed)
[facility] [place], [place], [address] Phone: [phone]   Fax:  [phone]  Physical Therapy Treatment  Patient Details  Name: Lonnie Hurley MRN: [id number] Date of Birth: [birth year] Referring Provider: Mechele Claude, PA  Encounter Date: 03/10/2017      PT End of Session - 03/10/17 1230    Visit Number 6   Number of Visits 12   Date for PT Re-Evaluation 04/21/17   PT Start Time 0902   PT Stop Time 0952   PT Time Calculation (min) 50 min   Activity Tolerance Patient tolerated treatment well   Behavior During Therapy [facility] for tasks assessed/performed      Past Medical History:  Diagnosis Date  . BPH (benign prostatic hypertrophy)    Dr. Rosana Hoes / Dr. Joelyn Oms  - Urologist   . Cancer [facility])    lip-mole surgery  . Cataract   . Colon polyps   . DDD (degenerative disc disease)   . Hearing loss   . Hypercholesteremia   . Hyperlipidemia   . Hypertension   . Leukoplakia   . Microscopic hematuria    negative work up with Urology in the past.   . OAB (overactive bladder)    with past percutaneous tibial nerve stimulation therapy  . Thrombocytopenia ([place])   . Tinnitus   . Tobacco abuse   . Vitamin B 12 deficiency   . Vitamin D deficiency     Past Surgical History:  Procedure Laterality Date  . CATARACT EXTRACTION  2007   right eye  . CATARACT EXTRACTION Left 03/21/15  . COLONOSCOPY    . HERNIA REPAIR  1980  . KNEE SURGERY  02-28-2008   replaced inside right knee  . Left Knee Repair  11-10-2006  . NOSE SURGERY    . POLYPECTOMY    . SKIN BIOPSY  10-08-2006   left ear    There were no vitals filed for this visit.      Subjective Assessment - 03/10/17 1239    Subjective it's getting better.   Pain Score 3    Pain Location Foot   Pain Orientation Right   Pain Descriptors / Indicators Sore   Pain Type Acute pain   Pain Onset More than a month ago                         [facility] Adult PT  Treatment/Exercise - 03/10/17 0001      Exercises   Exercises Ankle     Modalities   Modalities Vasopneumatic     Moist Heat Therapy   Number Minutes Moist Heat 15 Minutes   Moist Heat Location --  Right ankle.     Acupuncturist Location Right posterior heel.   Electrical Stimulation Action Pre-mod.   Electrical Stimulation Parameters 80-150 Hz x 15 minutes.   Electrical Stimulation Goals Pain     Iontophoresis   Type of Iontophoresis Dexamethasone   Location Right posterior heel.   Dose 80 mA-Min.     Manual Therapy   Manual Therapy Soft tissue mobilization   Soft tissue mobilization IASTM x 10 minutes to patient's right affected Achilles tendon.     Ankle Exercises: Seated   Other Seated Ankle Exercises Seated Rockerboard x 5 minutes.                PT Education - 03/10/17 1245    Education provided Yes   Education Details Instruct in  short duration ice massage.   Person(s) Educated Patient   Methods Explanation   Comprehension Verbalized understanding                    Plan - 03/10/17 1251    Clinical Impression Statement excellent job today.  Patient moct tender on medial aspect of Achilles calcaneal attachment.   Consulted and Agree with Plan of Care Patient      Patient will benefit from skilled therapeutic intervention in order to improve the following deficits and impairments:  Postural dysfunction, Impaired flexibility, Decreased range of motion, Decreased activity tolerance, Pain, Abnormal gait  Visit Diagnosis: Pain in right foot  Difficulty in walking, not elsewhere classified  Acute left-sided low back pain with left-sided sciatica     Problem List Patient Active Problem List   Diagnosis Date Noted  . Arthritis of back 08/25/2016  . AAA (abdominal aortic aneurysm) without rupture ([place]) 07/13/2016  . Lumbar spondylosis with myelopathy 07/13/2016  . Pre-diabetes 09/18/2015  . Metabolic  syndrome 06/00/4599  . Vitamin B 12 deficiency 12/14/2014  . Hereditary and idiopathic peripheral neuropathy 10/17/2014  . Vitamin D deficiency 06/29/2014  . BPH (benign prostatic hyperplasia) 06/29/2014  . Hypertension 11/17/2012  . Hyperlipidemia 11/17/2012  . DDD (degenerative disc disease), lumbar 11/17/2012  . Thrombocytopenia ([place])     Lonnie Hurley, Lonnie Hurley 03/10/2017, 12:55 PM  [facility] [address] [place], [place], [address] Phone: [phone]   Fax:  [phone]  Name: Lonnie Hurley MRN: [id number] Date of Birth: 07-06-1940

## 2017-03-12 ENCOUNTER — Encounter: Payer: Self-pay | Admitting: Physical Therapy

## 2017-03-12 ENCOUNTER — Ambulatory Visit: Payer: PPO | Admitting: Physical Therapy

## 2017-03-12 DIAGNOSIS — R262 Difficulty in walking, not elsewhere classified: Secondary | ICD-10-CM | POA: Diagnosis not present

## 2017-03-12 DIAGNOSIS — M79671 Pain in right foot: Secondary | ICD-10-CM

## 2017-03-12 NOTE — Therapy (Addendum)
[facility] [place], [place], [address] Phone: [phone]   Fax:  [phone]  Physical Therapy Treatment  Patient Details  Name: Lonnie Hurley MRN: [id number] Date of Birth: May 22, 1941 Referring Provider: Mechele Claude, PA  Encounter Date: 03/12/2017      PT End of Session - 03/12/17 0926    Visit Number 7   Number of Visits 12   Date for PT Re-Evaluation 04/21/17   PT Start Time 0901   PT Stop Time 0944   PT Time Calculation (min) 43 min   Activity Tolerance Patient tolerated treatment well   Behavior During Therapy [facility] for tasks assessed/performed      Past Medical History:  Diagnosis Date  . BPH (benign prostatic hypertrophy)    Dr. Rosana Hoes / Dr. Joelyn Oms  - Urologist   . Cancer [facility])    lip-mole surgery  . Cataract   . Colon polyps   . DDD (degenerative disc disease)   . Hearing loss   . Hypercholesteremia   . Hyperlipidemia   . Hypertension   . Leukoplakia   . Microscopic hematuria    negative work up with Urology in the past.   . OAB (overactive bladder)    with past percutaneous tibial nerve stimulation therapy  . Thrombocytopenia ([place])   . Tinnitus   . Tobacco abuse   . Vitamin B 12 deficiency   . Vitamin D deficiency     Past Surgical History:  Procedure Laterality Date  . CATARACT EXTRACTION  2007   right eye  . CATARACT EXTRACTION Left 03/21/15  . COLONOSCOPY    . HERNIA REPAIR  1980  . KNEE SURGERY  02-28-2008   replaced inside right knee  . Left Knee Repair  11-10-2006  . NOSE SURGERY    . POLYPECTOMY    . SKIN BIOPSY  10-08-2006   left ear    There were no vitals filed for this visit.      Subjective Assessment - 03/12/17 0901    Subjective Patient feels arrived with improvement overall   Pertinent History HTN, aortic aneurysm, prediabetic   Diagnostic tests xrays and MRI; showed arthritis throughout the spine, X-ray showed right heel spurs   Patient Stated Goals Stop hurting,  walk without pain   Currently in Pain? Yes   Pain Score 3    Pain Location Foot   Pain Orientation Right   Pain Descriptors / Indicators Sore   Pain Type Acute pain   Pain Onset More than a month ago   Pain Frequency Intermittent   Aggravating Factors  moderate standing walking   Pain Relieving Factors epsom salt bath                         [facility] Adult PT Treatment/Exercise - 03/12/17 0001      Moist Heat Therapy   Number Minutes Moist Heat 15 Minutes   Moist Heat Location Ankle     Electrical Stimulation   Electrical Stimulation Location Right posterior heel.   Electrical Stimulation Action premod   Electrical Stimulation Parameters 80-150hz  x72mn     Manual Therapy   Manual Therapy Soft tissue mobilization   Soft tissue mobilization IASTM to plantar fascia and achilles     Ankle Exercises: Seated   Heel Slides 20 reps   Other Seated Ankle Exercises rocerboard x571m     Ankle Exercises: Stretches   Gastroc Stretch 2 reps;60 seconds  Plan - 03/12/17 6962    Clinical Impression Statement Patient tolerated treatment well today. Patient feels improvement overall and reported being able to walk more with minimal discomfort and perform ADL's independently. Patient improved FOTO score 30% limitation today (initial 51%). Patient would like to be put on hold today due to overall improvement.    Rehab Potential Good   PT Frequency 2x / week   PT Duration 6 weeks   PT Treatment/Interventions ADLs/Self Care Home Management;Electrical Stimulation;Moist Heat;Traction;Ultrasound;Therapeutic activities;Therapeutic exercise;Neuromuscular re-education;Manual techniques;Patient/family education;Dry needling;Taping;Iontophoresis 21m/ml Dexamethasone;Gait training;Stair training;Functional mobility training   PT Next Visit Plan on hold   Consulted and Agree with Plan of Care Patient      Patient will benefit from skilled  therapeutic intervention in order to improve the following deficits and impairments:  Postural dysfunction, Impaired flexibility, Decreased range of motion, Decreased activity tolerance, Pain, Abnormal gait  Visit Diagnosis: Pain in right foot  Difficulty in walking, not elsewhere classified     Problem List Patient Active Problem List   Diagnosis Date Noted  . Arthritis of back 08/25/2016  . AAA (abdominal aortic aneurysm) without rupture ([place] 07/13/2016  . Lumbar spondylosis with myelopathy 07/13/2016  . Pre-diabetes 09/18/2015  . Metabolic syndrome 195/28/4132 . Vitamin B 12 deficiency 12/14/2014  . Hereditary and idiopathic peripheral neuropathy 10/17/2014  . Vitamin D deficiency 06/29/2014  . BPH (benign prostatic hyperplasia) 06/29/2014  . Hypertension 11/17/2012  . Hyperlipidemia 11/17/2012  . DDD (degenerative disc disease), lumbar 11/17/2012  . [place]     Crixus Mcaulay P, PTA 03/12/2017, 9:52 AM  [facility] [place] [address]Phone: [phone]  Fax:  [phone] Name: Lonnie RosatoMRN: [id number] of Birth: 1May 10, 1942 PHYSICAL THERAPY DISCHARGE SUMMARY  Visits from Start of Care: 7.  Current functional level related to goals / functional outcomes: See above.   Remaining deficits: See below.   Education / Equipment: HEP. Plan: Patient agrees to discharge.  Patient goals were not met. Patient is being discharged due to not returning since the last visit.  ?????         CMaliApplegate MPT

## 2017-03-17 ENCOUNTER — Ambulatory Visit (INDEPENDENT_AMBULATORY_CARE_PROVIDER_SITE_OTHER): Payer: PPO

## 2017-03-17 DIAGNOSIS — Z23 Encounter for immunization: Secondary | ICD-10-CM

## 2017-04-08 DIAGNOSIS — M7661 Achilles tendinitis, right leg: Secondary | ICD-10-CM | POA: Diagnosis not present

## 2017-04-16 DIAGNOSIS — M7661 Achilles tendinitis, right leg: Secondary | ICD-10-CM | POA: Diagnosis not present

## 2017-04-24 DIAGNOSIS — M6701 Short Achilles tendon (acquired), right ankle: Secondary | ICD-10-CM | POA: Diagnosis not present

## 2017-04-24 DIAGNOSIS — M9261 Juvenile osteochondrosis of tarsus, right ankle: Secondary | ICD-10-CM | POA: Diagnosis not present

## 2017-04-24 DIAGNOSIS — M7661 Achilles tendinitis, right leg: Secondary | ICD-10-CM | POA: Diagnosis not present

## 2017-05-01 DIAGNOSIS — H16223 Keratoconjunctivitis sicca, not specified as Sjogren's, bilateral: Secondary | ICD-10-CM | POA: Diagnosis not present

## 2017-05-01 DIAGNOSIS — H04123 Dry eye syndrome of bilateral lacrimal glands: Secondary | ICD-10-CM | POA: Diagnosis not present

## 2017-05-01 DIAGNOSIS — E119 Type 2 diabetes mellitus without complications: Secondary | ICD-10-CM | POA: Diagnosis not present

## 2017-05-01 DIAGNOSIS — Z961 Presence of intraocular lens: Secondary | ICD-10-CM | POA: Diagnosis not present

## 2017-05-01 LAB — HM DIABETES EYE EXAM

## 2017-06-25 ENCOUNTER — Encounter: Payer: Self-pay | Admitting: Family Medicine

## 2017-06-25 ENCOUNTER — Ambulatory Visit (INDEPENDENT_AMBULATORY_CARE_PROVIDER_SITE_OTHER): Payer: PPO | Admitting: Family Medicine

## 2017-06-25 VITALS — BP 131/80 | HR 75 | Temp 96.8°F | Ht 65.5 in | Wt 241.8 lb

## 2017-06-25 DIAGNOSIS — I10 Essential (primary) hypertension: Secondary | ICD-10-CM

## 2017-06-25 DIAGNOSIS — R7303 Prediabetes: Secondary | ICD-10-CM | POA: Diagnosis not present

## 2017-06-25 DIAGNOSIS — E785 Hyperlipidemia, unspecified: Secondary | ICD-10-CM

## 2017-06-25 LAB — BAYER DCA HB A1C WAIVED: HB A1C (BAYER DCA - WAIVED): 6 % (ref <7.0)

## 2017-06-25 MED ORDER — GLUCOSE BLOOD VI STRP: ORAL_STRIP | 1 refills | Status: DC

## 2017-06-25 MED ORDER — EZETIMIBE 10 MG PO TABS: 10.0000 mg | ORAL_TABLET | Freq: Every day | ORAL | 3 refills | Status: DC

## 2017-06-25 MED ORDER — HYDROCHLOROTHIAZIDE 12.5 MG PO TABS: 12.5000 mg | ORAL_TABLET | Freq: Every day | ORAL | 3 refills | Status: DC

## 2017-06-25 MED ORDER — RAMIPRIL 10 MG PO CAPS: 10.0000 mg | ORAL_CAPSULE | Freq: Every day | ORAL | 3 refills | Status: DC

## 2017-06-25 MED ORDER — SIMVASTATIN 40 MG PO TABS: 40.0000 mg | ORAL_TABLET | Freq: Every day | ORAL | 3 refills | Status: DC

## 2017-06-25 NOTE — Progress Notes (Signed)
   HPI  Patient presents today Here to follow-up for chronic medical conditions.  Prediabetes Patient is not watching his diet well currently. He requests a prescription for test strips and states that he checks his blood sugar about every week.  He understands that insurance will probably not cover this.  Hypertension Patient has very good medication compliance. He needs refills.  Hyperlipidemia Patient has good medication compliance, not really watching diet. He does not notice any side effect from Niaspan  PMH: Smoking status noted ROS: Per HPI  Objective: BP 131/80   Pulse 75   Temp (!) 96.8 F (36 C) (Oral)   Ht 5' 5.5" (1.664 m)   Wt 241 lb 12.8 oz (109.7 kg)   BMI 39.63 kg/m  Gen: NAD, alert, cooperative with exam HEENT: NCAT, EOMI, PERRL CV: RRR, good S1/S2, no murmur Resp: CTABL, no wheezes, non-labored Abd: SNTND, BS present, no guarding or organomegaly Ext: No edema, warm Neuro: Alert and oriented, No gross deficits  Assessment and plan:  #Hyperlipidemia Discontinue Niaspan, continue Zetia plus simvastatin Labs today, nonfasting  #Hypertension Well-controlled on current medications, no changes Labs  #Prediabetes Discussed therapeutic lifestyle changes A1c pending.    Orders Placed This Encounter  Procedures  . CMP14+EGFR  . CBC with Differential/Platelet  . Lipid panel  . Bayer DCA Hb A1c Waived    Meds ordered this encounter  Medications  . ezetimibe (ZETIA) 10 MG tablet    Sig: Take 1 tablet (10 mg total) by mouth daily.    Dispense:  90 tablet    Refill:  3  . hydrochlorothiazide (HYDRODIURIL) 12.5 MG tablet    Sig: Take 1 tablet (12.5 mg total) by mouth daily.    Dispense:  90 tablet    Refill:  3  . simvastatin (ZOCOR) 40 MG tablet    Sig: Take 1 tablet (40 mg total) by mouth at bedtime.    Dispense:  90 tablet    Refill:  3  . ramipril (ALTACE) 10 MG capsule    Sig: Take 1 capsule (10 mg total) by mouth daily.    Dispense:   90 capsule    Refill:  3  . glucose blood (ONE TOUCH ULTRA TEST) test strip    Sig: Use as instructed    Dispense:  50 each    Refill:  [place], MD [facility] 06/25/2017, 4:49 PM

## 2017-06-25 NOTE — Patient Instructions (Addendum)
Please call your insurance to verify cost of a Shingrix injection.   Great to see you!  Come back in 6 months  It is ok to stop niacin

## 2017-06-26 LAB — CMP14+EGFR
A/G RATIO: 1.7 (ref 1.2–2.2)
ALK PHOS: 58 IU/L (ref 39–117)
ALT: 19 IU/L (ref 0–44)
AST: 21 IU/L (ref 0–40)
Albumin: 4 g/dL (ref 3.5–4.8)
BILIRUBIN TOTAL: 0.6 mg/dL (ref 0.0–1.2)
BUN/Creatinine Ratio: 16 (ref 10–24)
BUN: 15 mg/dL (ref 8–27)
CHLORIDE: 102 mmol/L (ref 96–106)
CO2: 26 mmol/L (ref 20–29)
Calcium: 9.4 mg/dL (ref 8.6–10.2)
Creatinine, Ser: 0.93 mg/dL (ref 0.76–1.27)
GFR calc Af Amer: 93 mL/min/{1.73_m2} (ref >59)
GFR calc non Af Amer: 81 mL/min/{1.73_m2} (ref >59)
Globulin, Total: 2.4 g/dL (ref 1.5–4.5)
Glucose: 130 mg/dL — ABNORMAL HIGH (ref 65–99)
POTASSIUM: 3.7 mmol/L (ref 3.5–5.2)
Sodium: 145 mmol/L — ABNORMAL HIGH (ref 134–144)
Total Protein: 6.4 g/dL (ref 6.0–8.5)

## 2017-06-26 LAB — CBC WITH DIFFERENTIAL/PLATELET
BASOS ABS: 0.1 10*3/uL (ref 0.0–0.2)
Basos: 1 %
EOS (ABSOLUTE): 0.5 10*3/uL — AB (ref 0.0–0.4)
Eos: 5 %
Hematocrit: 44.5 % (ref 37.5–51.0)
Hemoglobin: 14.8 g/dL (ref 13.0–17.7)
IMMATURE GRANULOCYTES: 0 %
Immature Grans (Abs): 0 10*3/uL (ref 0.0–0.1)
Lymphocytes Absolute: 3.6 10*3/uL — ABNORMAL HIGH (ref 0.7–3.1)
Lymphs: 39 %
MCH: 29.5 pg (ref 26.6–33.0)
MCHC: 33.3 g/dL (ref 31.5–35.7)
MCV: 89 fL (ref 79–97)
Monocytes Absolute: 1.1 10*3/uL — ABNORMAL HIGH (ref 0.1–0.9)
Monocytes: 12 %
NEUTROS ABS: 3.8 10*3/uL (ref 1.4–7.0)
NEUTROS PCT: 43 %
PLATELETS: 157 10*3/uL (ref 150–379)
RBC: 5.02 x10E6/uL (ref 4.14–5.80)
RDW: 13.3 % (ref 12.3–15.4)
WBC: 9.2 10*3/uL (ref 3.4–10.8)

## 2017-06-26 LAB — LIPID PANEL
CHOLESTEROL TOTAL: 112 mg/dL (ref 100–199)
Chol/HDL Ratio: 2.8 ratio (ref 0.0–5.0)
HDL: 40 mg/dL (ref >39)
LDL Calculated: 32 mg/dL (ref 0–99)
Triglycerides: 200 mg/dL — ABNORMAL HIGH (ref 0–149)
VLDL CHOLESTEROL CAL: 40 mg/dL (ref 5–40)

## 2017-06-30 ENCOUNTER — Ambulatory Visit: Payer: PPO

## 2017-06-30 NOTE — Progress Notes (Signed)
error 

## 2017-07-22 DIAGNOSIS — M79671 Pain in right foot: Secondary | ICD-10-CM | POA: Diagnosis not present

## 2017-07-22 DIAGNOSIS — M7661 Achilles tendinitis, right leg: Secondary | ICD-10-CM | POA: Diagnosis not present

## 2017-08-31 ENCOUNTER — Encounter: Payer: Self-pay | Admitting: *Deleted

## 2017-08-31 ENCOUNTER — Ambulatory Visit (INDEPENDENT_AMBULATORY_CARE_PROVIDER_SITE_OTHER): Payer: PPO | Admitting: *Deleted

## 2017-08-31 VITALS — BP 130/70 | HR 71 | Ht 64.0 in | Wt 242.0 lb

## 2017-08-31 DIAGNOSIS — Z122 Encounter for screening for malignant neoplasm of respiratory organs: Secondary | ICD-10-CM

## 2017-08-31 DIAGNOSIS — Z0000 Encounter for general adult medical examination without abnormal findings: Secondary | ICD-10-CM

## 2017-08-31 NOTE — Progress Notes (Signed)
5 days 1/2 hour 3 days extra 1/2 hour    Subjective:   Lonnie Hurley is a 77 y.o. male who presents for a subsequent Medicare Annual Wellness Visit. Lonnie Hurley is here today with his brother who lives with him. He is not married and does not have children.   Review of Systems  Health is about the same as last year.   Cardiac: Risk factors-advanced age, sedentary lifestyle, obesity, hypertension, hyperlipidemia, male, smoking history  Other systems negative     Objective:    Today's Vitals   08/31/17 1442  BP: 130/70  Pulse: 71  Weight: 242 lb (109.8 kg)  Height: 5\' 4"  (1.626 m)   Body mass index is 41.54 kg/m.  Advanced Directives 02/19/2017 08/26/2016 08/25/2016 10/25/2015 02/14/2014  Does Patient Have a Medical Advance Directive? Yes Yes No No Yes  Type of Advance Directive - [place];Living will - - Living will;Healthcare Power of Attorney  Does patient want to make changes to medical advance directive? - No - Patient declined - - No - Patient declined  Copy of [place] in Chart? No - copy requested No - copy requested - - -  Would patient like information on creating a medical advance directive? - No - Patient declined Yes (MAU/Ambulatory/Procedural Areas - Information given) Yes - Educational materials given -    Current Medications (verified) Outpatient Encounter Medications as of 08/31/2017  Medication Sig  . acetaminophen (TYLENOL) 500 MG tablet Take 500 mg by mouth every 6 (six) hours as needed.  . Cholecalciferol (VITAMIN D3) 2000 UNITS TABS Take 1 tablet by mouth daily.   Marland Kitchen ezetimibe (ZETIA) 10 MG tablet Take 1 tablet (10 mg total) by mouth daily.  Marland Kitchen glucose blood (ONE TOUCH ULTRA TEST) test strip Use as instructed  . hydrochlorothiazide (HYDRODIURIL) 12.5 MG tablet Take 1 tablet (12.5 mg total) by mouth daily.  . Multiple Vitamins-Minerals (MULTIVITAMIN WITH MINERALS) tablet Take 1 tablet by mouth daily.  Marland Kitchen  nystatin-triamcinolone (MYCOLOG II) cream Apply 1 application topically 2 (two) times daily.  . Omega-3 Fatty Acids (FISH OIL) 1000 MG CAPS Take 1 capsule by mouth daily. 1200  . ramipril (ALTACE) 10 MG capsule Take 1 capsule (10 mg total) by mouth daily.  . simvastatin (ZOCOR) 40 MG tablet Take 1 tablet (40 mg total) by mouth at bedtime.  . Tamsulosin HCl (FLOMAX) 0.4 MG CAPS Take 0.4 mg by mouth at bedtime.   . meloxicam (MOBIC) 7.5 MG tablet Take 7.5 mg by mouth daily.   No facility-administered encounter medications on file as of 08/31/2017.     Allergies (verified) Penicillins; Aspirin; Crestor [rosuvastatin calcium]; Gabapentin; Hydrocodone; and Lyrica [pregabalin]   History: Past Medical History:  Diagnosis Date  . BPH (benign prostatic hypertrophy)    Dr. Rosana Hoes / Dr. Joelyn Oms  - Urologist   . Cancer [facility])    lip-mole surgery  . Cataract   . Colon polyps   . DDD (degenerative disc disease)   . Hearing loss   . Hypercholesteremia   . Hyperlipidemia   . Hypertension   . Leukoplakia   . Microscopic hematuria    negative work up with Urology in the past.   . OAB (overactive bladder)    with past percutaneous tibial nerve stimulation therapy  . Thrombocytopenia ([place])   . Tinnitus   . Tobacco abuse   . Vitamin B 12 deficiency   . Vitamin D deficiency    Past Surgical History:  Procedure Laterality  Date  . CATARACT EXTRACTION  2007   right eye  . CATARACT EXTRACTION Left 03/21/15  . COLONOSCOPY    . HERNIA REPAIR  1980  . KNEE SURGERY  02-28-2008   replaced inside right knee  . Left Knee Repair  11-10-2006  . NOSE SURGERY    . POLYPECTOMY    . SKIN BIOPSY  10-08-2006   left ear   Family History  Problem Relation Age of Onset  . Heart failure Father   . Glaucoma Mother   . Osteoporosis Brother   . Heart disease Brother   . Hypertension Brother   . Hyperlipidemia Brother   . Diabetes Brother   . Colon cancer Neg Hx    Social History   Socioeconomic History  .  Marital status: Single    Spouse name: Not on file  . Number of children: 0  . Years of education: Not on file  . Highest education level: Not on file  Social Needs  . Financial resource strain: Not hard at all  . Food insecurity - worry: Never true  . Food insecurity - inability: Never true  . Transportation needs - medical: Not on file  . Transportation needs - non-medical: Not on file  Occupational History    Comment: retired Immunologist  Tobacco Use  . Smoking status: Former Smoker    Packs/day: 1.00    Years: 50.00    Pack years: 50.00    Types: Cigarettes    Last attempt to quit: 08/14/2009    Years since quitting: 8.0  . Smokeless tobacco: Never Used  Substance and Sexual Activity  . Alcohol use: No  . Drug use: No  . Sexual activity: No  Other Topics Concern  . Not on file  Social History Narrative  . Not on file   Tobacco Counseling No current tobacco use  Activities of Daily Living Functional Status Survey: Is the patient deaf or have difficulty hearing?: No Does the patient have difficulty seeing, even when wearing glasses/contacts?: No Does the patient have difficulty concentrating, remembering, or making decisions?: No Does the patient have difficulty walking or climbing stairs?: No Does the patient have difficulty dressing or bathing?: No Does the patient have difficulty doing errands alone such as visiting a doctor's office or shopping?: No   Immunizations and Health Maintenance Immunization History  Administered Date(s) Administered  . Influenza, High Dose Seasonal PF 03/13/2016, 03/17/2017  . Influenza,inj,Quad PF,6+ Mos 03/14/2014, 03/16/2015  . Pneumococcal Conjugate-13 12/29/2013  . Pneumococcal Polysaccharide-23 03/16/2009  . Rabies, IM 01/10/2013, 01/17/2013, 01/24/2013, 01/31/2013  . Tdap 01/10/2013  . Zoster 03/17/2011   There are no preventive care reminders to display for this patient.  Patient Care Team: Timmothy Euler, MD as PCP - General (Family Medicine) Nobie Putnam, MD (Hematology and Oncology) Irine Seal, MD as Attending Physician (Urology) [place], Layne Benton, MD as Consulting Physician (Dermatology) Warden Fillers, MD as Consulting Physician (Ophthalmology) Erline Levine, MD as Consulting Physician (Neurosurgery)  No hospitalizations, ER visits, or surgeries this past year.    Assessment:   This is a routine wellness examination for Lonnie Hurley.  Hearing/Vision screen No deficits noted during visit.  Dietary issues and exercise activities discussed:  Diet: Eats 3 meals a day  Current Exercise Habits: Structured exercise class, Type of exercise: strength training/weights(eliptical), Time (Minutes): 30, Frequency (Times/Week): 5, Weekly Exercise (Minutes/Week): 150, Intensity: Moderate Exercise limited by: None identified   Goals    . Weight (lb) < 225 lb (  102.1 kg)     Recommend goal is weight loss of 10% of weight as initial goal.       Depression Screen PHQ 2/9 Scores 06/25/2017 12/22/2016 11/07/2016 08/25/2016  PHQ - 2 Score 0 0 0 0    Fall Risk Fall Risk  06/25/2017 12/22/2016 11/07/2016 08/25/2016 08/07/2016  Falls in the past year? No No No No No   Cognitive Function: MMSE - Mini Mental State Exam 08/31/2017 08/25/2016 10/25/2015  Orientation to time 5 5 5   Orientation to Place 5 5 5   Registration 3 3 3   Attention/ Calculation 5 5 4   Recall 3 3 3   Language- name 2 objects 2 2 2   Language- repeat 1 1 1   Language- follow 3 step command 3 3 3   Language- read & follow direction 1 1 1   Write a sentence 1 1 1   Copy design 1 1 0  Total score 30 30 28          Screening Tests Health Maintenance  Topic Date Due  . COLONOSCOPY  03/01/2019  . TETANUS/TDAP  01/11/2023  . INFLUENZA VACCINE  Completed  . PNA vac Low Risk Adult  Completed        Plan:  Keep f/u with PCP Continue to stay active. Aim for 150 minutes of moderate activity a week Recommend weight loss of  10% as initial goal Colonoscopy due 02/2018  I have personally reviewed and noted the following in the patient's chart:   . Medical and social history . Use of alcohol, tobacco or illicit drugs  . Current medications and supplements . Functional ability and status . Nutritional status . Physical activity . Advanced directives . List of other physicians . Hospitalizations, surgeries, and ER visits in previous 12 months . Vitals . Screenings to include cognitive, depression, and falls . Referrals and appointments  In addition, I have reviewed and discussed with patient certain preventive protocols, quality metrics, and best practice recommendations. A written personalized care plan for preventive services as well as general preventive health recommendations were provided to patient.     Chong Sicilian, RN   09/01/2017

## 2017-08-31 NOTE — Patient Instructions (Addendum)
   Lonnie Hurley , Thank you for taking time to come for your Medicare Wellness Visit. I appreciate your ongoing commitment to your health goals. Please review the following plan we discussed and let me know if I can assist you in the future.   These are the goals we discussed: Goals    . Weight (lb) < 225 lb (102.1 kg)     Recommend goal is weight loss of 10% of weight as initial goal.        This is a list of the screening recommended for you and due dates:  Health Maintenance  Topic Date Due  . Colon Cancer Screening  03/01/2019  . Tetanus Vaccine  01/11/2023  . Flu Shot  Completed  . Pneumonia vaccines  Completed

## 2017-09-08 DIAGNOSIS — L570 Actinic keratosis: Secondary | ICD-10-CM | POA: Diagnosis not present

## 2017-09-08 DIAGNOSIS — L821 Other seborrheic keratosis: Secondary | ICD-10-CM | POA: Diagnosis not present

## 2017-09-08 DIAGNOSIS — D1801 Hemangioma of skin and subcutaneous tissue: Secondary | ICD-10-CM | POA: Diagnosis not present

## 2017-09-08 DIAGNOSIS — Z85828 Personal history of other malignant neoplasm of skin: Secondary | ICD-10-CM | POA: Diagnosis not present

## 2017-09-08 DIAGNOSIS — L218 Other seborrheic dermatitis: Secondary | ICD-10-CM | POA: Diagnosis not present

## 2017-09-11 ENCOUNTER — Telehealth: Payer: Self-pay | Admitting: Family Medicine

## 2017-09-11 DIAGNOSIS — I10 Essential (primary) hypertension: Secondary | ICD-10-CM

## 2017-09-11 MED ORDER — HYDROCHLOROTHIAZIDE 12.5 MG PO TABS: 12.5000 mg | ORAL_TABLET | Freq: Every day | ORAL | 0 refills | Status: DC

## 2017-09-11 NOTE — Telephone Encounter (Signed)
Brother aware

## 2017-09-11 NOTE — Telephone Encounter (Signed)
Refill hctz 73 tabs per request.   Laroy Apple, MD [facility] 09/11/2017, 2:56 PM     From staff: pt wants a refill on hydrochlorothiazide (HYDRODIURIL) 12.5 MG tablet. He wants a refill of 73 tablets. Use [facility] in [place]

## 2017-09-17 ENCOUNTER — Ambulatory Visit (HOSPITAL_COMMUNITY)
Admission: RE | Admit: 2017-09-17 | Discharge: 2017-09-17 | Disposition: A | Discharge status: Home / Self Care | Payer: PPO | Source: Ambulatory Visit | Attending: Family Medicine | Admitting: Family Medicine

## 2017-09-17 DIAGNOSIS — I2510 Atherosclerotic heart disease of native coronary artery without angina pectoris: Secondary | ICD-10-CM | POA: Diagnosis not present

## 2017-09-17 DIAGNOSIS — I700 Atherosclerosis of aorta: Secondary | ICD-10-CM | POA: Diagnosis not present

## 2017-09-17 DIAGNOSIS — Z87891 Personal history of nicotine dependence: Secondary | ICD-10-CM | POA: Diagnosis not present

## 2017-09-17 DIAGNOSIS — J439 Emphysema, unspecified: Secondary | ICD-10-CM | POA: Diagnosis not present

## 2017-09-17 DIAGNOSIS — Z122 Encounter for screening for malignant neoplasm of respiratory organs: Secondary | ICD-10-CM | POA: Diagnosis not present

## 2017-09-17 DIAGNOSIS — J432 Centrilobular emphysema: Secondary | ICD-10-CM | POA: Diagnosis not present

## 2017-09-17 DIAGNOSIS — K760 Fatty (change of) liver, not elsewhere classified: Secondary | ICD-10-CM | POA: Diagnosis not present

## 2017-11-04 ENCOUNTER — Ambulatory Visit (INDEPENDENT_AMBULATORY_CARE_PROVIDER_SITE_OTHER): Payer: PPO | Admitting: Family Medicine

## 2017-11-04 ENCOUNTER — Encounter: Payer: Self-pay | Admitting: Family Medicine

## 2017-11-04 VITALS — BP 122/72 | HR 66 | Temp 97.4°F | Ht 64.0 in | Wt 238.0 lb

## 2017-11-04 DIAGNOSIS — S80912A Unspecified superficial injury of left knee, initial encounter: Secondary | ICD-10-CM

## 2017-11-04 DIAGNOSIS — W57XXXA Bitten or stung by nonvenomous insect and other nonvenomous arthropods, initial encounter: Secondary | ICD-10-CM | POA: Diagnosis not present

## 2017-11-04 MED ORDER — DOXYCYCLINE HYCLATE 100 MG PO TABS: 200.0000 mg | ORAL_TABLET | Freq: Once | ORAL | 0 refills | Status: AC

## 2017-11-04 NOTE — Progress Notes (Signed)
BP 122/72   Pulse 66   Temp (!) 97.4 F (36.3 C) (Oral)   Ht 5\' 4"  (1.626 m)   Wt 238 lb (108 kg)   BMI 40.85 kg/m    Subjective:    Patient ID: Lonnie Hurley, male    DOB: August 21, 1940, 77 y.o.   MRN: [id number]  HPI: Lonnie Hurley is a 77 y.o. male presenting on 11/04/2017 for Spot behind left leg (Saturday evening he started itching behind left knee, now there is a red spot there and itching is gone)   HPI Small rash Patient had itching 2 nights ago when he was taken a shower behind his left knee and he felt like he may have scratched something off and has not had itching since but now there is a small red bump there and he does not know if it was a tick bite or not but thinks it may have been.  He denies any fevers or chills or rashes anywhere else.  He denies any shortness of breath or wheezing.  He has had tick bites before.  He was in the shower and he thinks it may have washed down the drain  Relevant past medical, surgical, family and social history reviewed and updated as indicated. Interim medical history since our last visit reviewed. Allergies and medications reviewed and updated.  Review of Systems  Constitutional: Negative for chills and fever.  Respiratory: Negative for shortness of breath and wheezing.   Cardiovascular: Negative for chest pain and leg swelling.  Musculoskeletal: Negative for back pain and gait problem.  Skin: Positive for rash.  All other systems reviewed and are negative.   Per HPI unless specifically indicated above   Allergies as of 11/04/2017      Reactions   Penicillins Other (See Comments)   unknown   Aspirin Other (See Comments)   Causes Blood in urine   Crestor [rosuvastatin Calcium] Other (See Comments)   Myalgia, "joint problem"   Gabapentin Other (See Comments)   Blurry vision   Hydrocodone Other (See Comments)   Constipation    Lyrica [pregabalin]    Blurry vision      Medication List        Accurate as of 11/04/17  10:08 AM. Always use your most recent med list.          acetaminophen 500 MG tablet Commonly known as:  TYLENOL Take 500 mg by mouth every 6 (six) hours as needed.   ezetimibe 10 MG tablet Commonly known as:  ZETIA Take 1 tablet (10 mg total) by mouth daily.   Fish Oil 1000 MG Caps Take 1 capsule by mouth daily. 1200   FLOMAX 0.4 MG Caps capsule Generic drug:  tamsulosin Take 0.4 mg by mouth at bedtime.   glucose blood test strip Commonly known as:  ONE TOUCH ULTRA TEST Use as instructed   hydrochlorothiazide 12.5 MG tablet Commonly known as:  HYDRODIURIL Take 1 tablet (12.5 mg total) by mouth daily.   meloxicam 7.5 MG tablet Commonly known as:  MOBIC Take 7.5 mg by mouth daily.   multivitamin with minerals tablet Take 1 tablet by mouth daily.   nystatin-triamcinolone cream Commonly known as:  MYCOLOG II Apply 1 application topically 2 (two) times daily.   ramipril 10 MG capsule Commonly known as:  ALTACE Take 1 capsule (10 mg total) by mouth daily.   simvastatin 40 MG tablet Commonly known as:  ZOCOR Take 1 tablet (40 mg total) by mouth at bedtime.  Vitamin D3 2000 units Tabs Take 1 tablet by mouth daily.          Objective:    BP 122/72   Pulse 66   Temp (!) 97.4 F (36.3 C) (Oral)   Ht 5\' 4"  (1.626 m)   Wt 238 lb (108 kg)   BMI 40.85 kg/m   Wt Readings from Last 3 Encounters:  11/04/17 238 lb (108 kg)  08/31/17 242 lb (109.8 kg)  06/25/17 241 lb 12.8 oz (109.7 kg)    Physical Exam  Constitutional: He is oriented to person, place, and time. He appears well-developed and well-nourished. No distress.  Eyes: Conjunctivae are normal. No scleral icterus.  Neck: Neck supple. No thyromegaly present.  Lymphadenopathy:    He has no cervical adenopathy.  Neurological: He is alert and oriented to person, place, and time. Coordination normal.  Skin: Skin is warm and dry. Rash (Small eschar behind the left knee with small amount of erythema  immediately surrounding it) noted. He is not diaphoretic.  Psychiatric: He has a normal mood and affect. His behavior is normal.  Nursing note and vitals reviewed.       Assessment & Plan:   Problem List Items Addressed This Visit    None    Visit Diagnoses    Tick bite, initial encounter    -  Primary   Possible tick bite on left posterior knee   Relevant Medications   doxycycline (VIBRA-TABS) 100 MG tablet      Will give prophylaxis dose for tick bites Follow up plan: Return if symptoms worsen or fail to improve.  Counseling provided for all of the vaccine components No orders of the defined types were placed in this encounter.   Caryl Pina, MD [facility] 11/04/2017, 10:08 AM

## 2017-12-01 ENCOUNTER — Ambulatory Visit (INDEPENDENT_AMBULATORY_CARE_PROVIDER_SITE_OTHER): Payer: PPO | Admitting: Family

## 2017-12-01 ENCOUNTER — Encounter: Payer: Self-pay | Admitting: Family

## 2017-12-01 VITALS — BP 115/62 | HR 67 | Temp 97.4°F | Ht 64.0 in | Wt 239.0 lb

## 2017-12-01 DIAGNOSIS — W57XXXA Bitten or stung by nonvenomous insect and other nonvenomous arthropods, initial encounter: Secondary | ICD-10-CM

## 2017-12-01 DIAGNOSIS — S80862A Insect bite (nonvenomous), left lower leg, initial encounter: Secondary | ICD-10-CM

## 2017-12-01 MED ORDER — DOXYCYCLINE HYCLATE 100 MG PO TABS: 200.0000 mg | ORAL_TABLET | Freq: Once | ORAL | 0 refills | Status: AC

## 2017-12-01 NOTE — Patient Instructions (Signed)

## 2017-12-01 NOTE — Progress Notes (Signed)
   Subjective:    Patient ID: Lonnie Hurley, male    DOB: 09/05/1940, 77 y.o.   MRN: [id number]  Chief Complaint  Patient presents with  . Tick Removal    HPI PT presents to the office today with a tick bite on his left calf that he noticed last night States he noticed his lower leg was "itching" and then noticed he had a small tick. He removed the tick and put some rubbing alcohol on it.   States the area does not itch, fever, tenderness, rash, or flu like symptoms.    Review of Systems  Skin: Positive for rash.  All other systems reviewed and are negative.      Objective:   Physical Exam  Constitutional: He is oriented to person, place, and time. He appears well-developed and well-nourished. No distress.  HENT:  Head: Normocephalic.  Eyes: Pupils are equal, round, and reactive to light. Right eye exhibits no discharge. Left eye exhibits no discharge.  Neck: Normal range of motion. Neck supple. No thyromegaly present.  Cardiovascular: Normal rate, regular rhythm, normal heart sounds and intact distal pulses.  No murmur heard. Pulmonary/Chest: Effort normal and breath sounds normal. No respiratory distress. He has no wheezes.  Abdominal: Soft. Bowel sounds are normal. He exhibits no distension. There is no tenderness.  Musculoskeletal: Normal range of motion. He exhibits no edema or tenderness.  Neurological: He is alert and oriented to person, place, and time. He has normal reflexes. No cranial nerve deficit.  Skin: Skin is warm and dry. Rash noted. No erythema.  Small erythemas macule approx 1X1.1 cm on left lateral calf  Psychiatric: He has a normal mood and affect. His behavior is normal. Judgment and thought content normal.  Vitals reviewed.     BP 115/62   Pulse 67   Temp (!) 97.4 F (36.3 C) (Oral)   Ht 5\' 4"  (1.626 m)   Wt 239 lb (108.4 kg)   BMI 41.02 kg/m      Assessment & Plan:  Lonnie Hurley was seen today for tick removal.  Diagnoses and all orders for  this visit:  Tick bite of left calf, initial encounter -     doxycycline (VIBRA-TABS) 100 MG tablet; Take 2 tablets (200 mg total) by mouth once for 1 dose.   -Pt to report any new fever, joint pain, or rash -Wear protective clothing while outside- Long sleeves and long pants -Put insect repellent on all exposed skin and along clothing -Take a shower as soon as possible after being outside -RTO if symptoms worsen or do not improve   [name], FNP

## 2017-12-07 ENCOUNTER — Encounter: Payer: Self-pay | Admitting: Physician Assistant

## 2017-12-07 ENCOUNTER — Ambulatory Visit (INDEPENDENT_AMBULATORY_CARE_PROVIDER_SITE_OTHER): Payer: PPO

## 2017-12-07 ENCOUNTER — Ambulatory Visit (INDEPENDENT_AMBULATORY_CARE_PROVIDER_SITE_OTHER): Payer: PPO | Admitting: Physician Assistant

## 2017-12-07 VITALS — BP 118/67 | HR 79 | Temp 97.6°F | Ht 64.0 in | Wt 236.2 lb

## 2017-12-07 DIAGNOSIS — R109 Unspecified abdominal pain: Secondary | ICD-10-CM | POA: Diagnosis not present

## 2017-12-07 DIAGNOSIS — M546 Pain in thoracic spine: Secondary | ICD-10-CM | POA: Diagnosis not present

## 2017-12-07 DIAGNOSIS — R319 Hematuria, unspecified: Secondary | ICD-10-CM | POA: Diagnosis not present

## 2017-12-07 DIAGNOSIS — M545 Low back pain: Secondary | ICD-10-CM | POA: Diagnosis not present

## 2017-12-07 DIAGNOSIS — R079 Chest pain, unspecified: Secondary | ICD-10-CM | POA: Diagnosis not present

## 2017-12-07 LAB — URINALYSIS
Bilirubin, UA: NEGATIVE
GLUCOSE, UA: NEGATIVE
KETONES UA: NEGATIVE
Leukocytes, UA: NEGATIVE
NITRITE UA: NEGATIVE
SPEC GRAV UA: 1.025 (ref 1.005–1.030)
UUROB: 0.2 mg/dL (ref 0.2–1.0)
pH, UA: 6 (ref 5.0–7.5)

## 2017-12-07 LAB — MICROSCOPIC EXAMINATION
Bacteria, UA: NONE SEEN
Renal Epithel, UA: NONE SEEN /hpf

## 2017-12-07 LAB — URINALYSIS, COMPLETE
BILIRUBIN UA: NEGATIVE
Glucose, UA: NEGATIVE
KETONES UA: NEGATIVE
Leukocytes, UA: NEGATIVE
Nitrite, UA: NEGATIVE
PH UA: 6 (ref 5.0–7.5)
SPEC GRAV UA: 1.025 (ref 1.005–1.030)
UUROB: 0.2 mg/dL (ref 0.2–1.0)

## 2017-12-07 MED ORDER — ACETAMINOPHEN-CODEINE #3 300-30 MG PO TABS: 1.0000 | ORAL_TABLET | Freq: Four times a day (QID) | ORAL | 0 refills | Status: DC | PRN

## 2017-12-07 MED ORDER — PREDNISONE 10 MG (21) PO TBPK: ORAL_TABLET | ORAL | 0 refills | Status: DC

## 2017-12-07 NOTE — Progress Notes (Signed)
This patient comes in today having severe right flank pain.  He states that have been briefly off and on over the previous couple of weeks.  However last night it started and has not stopped since.  It is made worse by movement is made worse by a deep breath.  He denies any cough or congestion.  He denies any fever or chills, no nausea vomiting or diarrhea.  He does have a distant history of a kidney stone.  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  He is followed by Dr. Webb Silversmith urology.  He states that he has a lab appointment on July 11 and an appointment with the urologist on July 18.  He had called their office this morning hoping to be seen but they were unable to get him in right now.  We are going to fax some of our information on to them.    The patient also has degenerative bone disease throughout his spine.    BP 118/67   Pulse 79   Temp 97.6 F (36.4 C) (Oral)   Ht 5\' 4"  (1.626 m)   Wt 236 lb 3.2 oz (107.1 kg)   BMI 40.54 kg/m    Subjective:    Patient ID: Lonnie Hurley, male    DOB: February 04, 1941, 77 y.o.   MRN: [id number]  HPI: Lonnie Hurley is a 77 y.o. male presenting on 12/07/2017 for Back Pain (right side )    Past Medical History:  Diagnosis Date  . BPH (benign prostatic hypertrophy)    Dr. Rosana Hoes / Dr. Joelyn Oms  - Urologist   . Cancer [facility])    lip-mole surgery  . Cataract   . Colon polyps   . DDD (degenerative disc disease)   . Hearing loss   . Hypercholesteremia   . Hyperlipidemia   . Hypertension   . Leukoplakia   . Microscopic hematuria    negative work up with Urology in the past.   . OAB (overactive bladder)    with past percutaneous tibial nerve stimulation therapy  . Thrombocytopenia ([place])   . Tinnitus   . Tobacco abuse   . Vitamin B 12 deficiency   . Vitamin D deficiency    Relevant past medical, surgical, family and social history reviewed and updated as indicated. Interim medical history since our last visit  reviewed. Allergies and medications reviewed and updated. DATA REVIEWED: CHART IN EPIC  Family History reviewed for pertinent findings.  Review of Systems  Constitutional: Negative.  Negative for appetite change and fatigue.  Eyes: Negative for pain and visual disturbance.  Respiratory: Negative.  Negative for cough, chest tightness, shortness of breath and wheezing.   Cardiovascular: Negative.  Negative for chest pain, palpitations and leg swelling.  Gastrointestinal: Negative.  Negative for abdominal pain, diarrhea, nausea and vomiting.  Genitourinary: Positive for flank pain and hematuria. Negative for decreased urine volume, difficulty urinating, dysuria, enuresis, frequency, penile swelling, testicular pain and urgency.  Musculoskeletal: Positive for arthralgias and back pain.  Skin: Negative.  Negative for color change and rash.  Neurological: Negative.  Negative for weakness, numbness and headaches.  Psychiatric/Behavioral: Negative.     Allergies as of 12/07/2017      Reactions   Penicillins Other (See Comments)   unknown   Aspirin Other (See Comments)   Causes Blood in urine   Crestor [rosuvastatin Calcium] Other (See Comments)   Myalgia, "joint problem"   Gabapentin Other (See Comments)   Blurry vision  Hydrocodone Other (See Comments)   Constipation    Lyrica [pregabalin]    Blurry vision      Medication List        Accurate as of 12/07/17  2:09 PM. Always use your most recent med list.          acetaminophen 500 MG tablet Commonly known as:  TYLENOL Take 500 mg by mouth every 6 (six) hours as needed.   acetaminophen-codeine 300-30 MG tablet Commonly known as:  TYLENOL #3 Take 1 tablet by mouth every 6 (six) hours as needed for moderate pain.   ezetimibe 10 MG tablet Commonly known as:  ZETIA Take 1 tablet (10 mg total) by mouth daily.   Fish Oil 1000 MG Caps Take 1 capsule by mouth daily. 1200   FLOMAX 0.4 MG Caps capsule Generic drug:   tamsulosin Take 0.4 mg by mouth at bedtime.   glucose blood test strip Commonly known as:  ONE TOUCH ULTRA TEST Use as instructed   hydrochlorothiazide 12.5 MG tablet Commonly known as:  HYDRODIURIL Take 1 tablet (12.5 mg total) by mouth daily.   multivitamin with minerals tablet Take 1 tablet by mouth daily.   nystatin-triamcinolone cream Commonly known as:  MYCOLOG II Apply 1 application topically 2 (two) times daily.   predniSONE 10 MG (21) Tbpk tablet Commonly known as:  STERAPRED UNI-PAK 21 TAB Take as directed for 12 days   ramipril 10 MG capsule Commonly known as:  ALTACE Take 1 capsule (10 mg total) by mouth daily.   simvastatin 40 MG tablet Commonly known as:  ZOCOR Take 1 tablet (40 mg total) by mouth at bedtime.   Vitamin D3 2000 units Tabs Take 1 tablet by mouth daily.          Objective:    BP 118/67   Pulse 79   Temp 97.6 F (36.4 C) (Oral)   Ht 5\' 4"  (1.626 m)   Wt 236 lb 3.2 oz (107.1 kg)   BMI 40.54 kg/m   Allergies  Allergen Reactions  . Penicillins Other (See Comments)    unknown  . Aspirin Other (See Comments)    Causes Blood in urine  . Crestor [Rosuvastatin Calcium] Other (See Comments)    Myalgia, "joint problem"  . Gabapentin Other (See Comments)    Blurry vision   . Hydrocodone Other (See Comments)    Constipation    . Lyrica [Pregabalin]     Blurry vision     Wt Readings from Last 3 Encounters:  12/07/17 236 lb 3.2 oz (107.1 kg)  12/01/17 239 lb (108.4 kg)  11/04/17 238 lb (108 kg)    Physical Exam  Constitutional: He appears well-developed and well-nourished. No distress.  HENT:  Head: Normocephalic and atraumatic.  Eyes: Pupils are equal, round, and reactive to light. Conjunctivae and EOM are normal.  Cardiovascular: Normal rate, regular rhythm and normal heart sounds.  Pulmonary/Chest: Effort normal and breath sounds normal. No respiratory distress.  Abdominal: There is no hepatosplenomegaly. There is CVA  tenderness. There is no rigidity and no guarding.  Skin: Skin is warm and dry.  Psychiatric: He has a normal mood and affect. His behavior is normal.  Nursing note and vitals reviewed.   Results for orders placed or performed in visit on 12/07/17  Microscopic Examination  Result Value Ref Range   WBC, UA 0-5 0 - 5 /hpf   RBC, UA 3-10 (A) 0 - 2 /hpf   Epithelial Cells (non renal) 0-10 0 -  10 /hpf   Renal Epithel, UA None seen None seen /hpf   Bacteria, UA None seen None seen/Few  Urinalysis, Complete  Result Value Ref Range   Specific Gravity, UA 1.025 1.005 - 1.030   pH, UA 6.0 5.0 - 7.5   Color, UA Yellow Yellow   Appearance Ur Clear Clear   Leukocytes, UA Negative Negative   Protein, UA Trace (A) Negative/Trace   Glucose, UA Negative Negative   Ketones, UA Negative Negative   RBC, UA 2+ (A) Negative   Bilirubin, UA Negative Negative   Urobilinogen, Ur 0.2 0.2 - 1.0 mg/dL   Nitrite, UA Negative Negative   Microscopic Examination See below:   Urinalysis  Result Value Ref Range   Specific Gravity, UA 1.025 1.005 - 1.030   pH, UA 6.0 5.0 - 7.5   Color, UA Yellow Yellow   Appearance Ur Clear Clear   Leukocytes, UA Negative Negative   Protein, UA Trace (A) Negative/Trace   Glucose, UA Negative Negative   Ketones, UA Negative Negative   RBC, UA 2+ (A) Negative   Bilirubin, UA Negative Negative   Urobilinogen, Ur 0.2 0.2 - 1.0 mg/dL   Nitrite, UA Negative Negative      Assessment & Plan:   1. Thoracolumbar back pain - DG Thoracic Spine 2 View; Future - DG Lumbar Spine 2-3 Views; Future - predniSONE (STERAPRED UNI-PAK 21 TAB) 10 MG (21) TBPK tablet; Take as directed for 12 days  Dispense: 48 tablet; Refill: 0  2. Flank pain - DG Thoracic Spine 2 View; Future - DG Lumbar Spine 2-3 Views; Future - Urine Culture - Urinalysis, Complete - acetaminophen-codeine (TYLENOL #3) 300-30 MG tablet; Take 1 tablet by mouth every 6 (six) hours as needed for moderate pain.  Dispense:  20 tablet; Refill: 0 - Urinalysis - Microscopic Examination  3. Hematuria, unspecified type - acetaminophen-codeine (TYLENOL #3) 300-30 MG tablet; Take 1 tablet by mouth every 6 (six) hours as needed for moderate pain.  Dispense: 20 tablet; Refill: 0 - Urinalysis - Microscopic Examination   Continue all other maintenance medications as listed above.  Follow up plan: No follow-ups on file.  Educational handout given for [place] PA-C [facility] [address]  [place], [place] [address] [phone]   12/07/2017, 2:09 PM

## 2017-12-08 LAB — URINE CULTURE

## 2017-12-24 DIAGNOSIS — N401 Enlarged prostate with lower urinary tract symptoms: Secondary | ICD-10-CM | POA: Diagnosis not present

## 2017-12-28 ENCOUNTER — Ambulatory Visit (INDEPENDENT_AMBULATORY_CARE_PROVIDER_SITE_OTHER): Payer: PPO | Admitting: Family Medicine

## 2017-12-28 ENCOUNTER — Encounter: Payer: Self-pay | Admitting: Family Medicine

## 2017-12-28 VITALS — BP 108/56 | HR 80 | Temp 97.6°F | Ht 64.0 in | Wt 237.4 lb

## 2017-12-28 DIAGNOSIS — R7303 Prediabetes: Secondary | ICD-10-CM

## 2017-12-28 DIAGNOSIS — R5383 Other fatigue: Secondary | ICD-10-CM

## 2017-12-28 DIAGNOSIS — E782 Mixed hyperlipidemia: Secondary | ICD-10-CM | POA: Diagnosis not present

## 2017-12-28 DIAGNOSIS — I10 Essential (primary) hypertension: Secondary | ICD-10-CM | POA: Diagnosis not present

## 2017-12-28 LAB — BAYER DCA HB A1C WAIVED: HB A1C (BAYER DCA - WAIVED): 6.4 % (ref <7.0)

## 2017-12-28 NOTE — Patient Instructions (Signed)
Great to see you!   

## 2017-12-28 NOTE — Progress Notes (Signed)
   HPI  Patient presents today for follow-up chronic medical conditions.  Patient states that he is continued to have fatigue. He also complains of right-sided low back pain and right foot pain.  This was helped by a recent course of prednisone.  He is wondering if there is a kidney stone.  He has follow-up with urology in a few days.  Hypertension Good medication compliance and tolerance  Hyperlipidemia Good compliance with Zocor plus Zetia   PMH: Smoking status noted ROS: Per HPI  Objective: BP (!) 108/56   Pulse 80   Temp 97.6 F (36.4 C) (Oral)   Ht '5\' 4"'$  (1.626 m)   Wt 237 lb 6.4 oz (107.7 kg)   BMI 40.75 kg/m  Gen: NAD, alert, cooperative with exam HEENT: NCAT CV: RRR, good S1/S2, no murmur Resp: CTABL, no wheezes, non-labored Ext: No edema, warm Neuro: Alert and oriented, No gross deficits  Assessment and plan:  #Hypertension Well-controlled No changes Labs today  #Hyperlipidemia Previously well controlled, labs today  #Fatigue Likely multifactorial Labs today including TSH  #Prediabetes Recheck A1c, continue healthy lifestyle choices   Orders Placed This Encounter  Procedures  . CMP14+EGFR  . CBC with Differential/Platelet  . Lipid panel  . Bayer DCA Hb A1c Waived  . TSH    Laroy Apple, MD [facility] 12/28/2017, 3:51 PM

## 2017-12-29 LAB — CBC WITH DIFFERENTIAL/PLATELET
BASOS: 0 %
Basophils Absolute: 0 10*3/uL (ref 0.0–0.2)
EOS (ABSOLUTE): 0.3 10*3/uL (ref 0.0–0.4)
EOS: 4 %
HEMATOCRIT: 43.7 % (ref 37.5–51.0)
Hemoglobin: 14.4 g/dL (ref 13.0–17.7)
IMMATURE GRANULOCYTES: 0 %
Immature Grans (Abs): 0 10*3/uL (ref 0.0–0.1)
LYMPHS ABS: 2.6 10*3/uL (ref 0.7–3.1)
Lymphs: 32 %
MCH: 29 pg (ref 26.6–33.0)
MCHC: 33 g/dL (ref 31.5–35.7)
MCV: 88 fL (ref 79–97)
MONOCYTES: 11 %
Monocytes Absolute: 0.9 10*3/uL (ref 0.1–0.9)
Neutrophils Absolute: 4.3 10*3/uL (ref 1.4–7.0)
Neutrophils: 53 %
Platelets: 132 10*3/uL — ABNORMAL LOW (ref 150–450)
RBC: 4.96 x10E6/uL (ref 4.14–5.80)
RDW: 13.7 % (ref 12.3–15.4)
WBC: 8.2 10*3/uL (ref 3.4–10.8)

## 2017-12-29 LAB — CMP14+EGFR
ALBUMIN: 3.8 g/dL (ref 3.5–4.8)
ALT: 21 IU/L (ref 0–44)
AST: 14 IU/L (ref 0–40)
Albumin/Globulin Ratio: 1.7 (ref 1.2–2.2)
Alkaline Phosphatase: 50 IU/L (ref 39–117)
BUN / CREAT RATIO: 15 (ref 10–24)
BUN: 15 mg/dL (ref 8–27)
Bilirubin Total: 0.5 mg/dL (ref 0.0–1.2)
CALCIUM: 8.7 mg/dL (ref 8.6–10.2)
CO2: 27 mmol/L (ref 20–29)
CREATININE: 0.98 mg/dL (ref 0.76–1.27)
Chloride: 102 mmol/L (ref 96–106)
GFR calc Af Amer: 87 mL/min/{1.73_m2} (ref >59)
GFR, EST NON AFRICAN AMERICAN: 76 mL/min/{1.73_m2} (ref >59)
GLOBULIN, TOTAL: 2.2 g/dL (ref 1.5–4.5)
GLUCOSE: 147 mg/dL — AB (ref 65–99)
Potassium: 3.9 mmol/L (ref 3.5–5.2)
SODIUM: 142 mmol/L (ref 134–144)
TOTAL PROTEIN: 6 g/dL (ref 6.0–8.5)

## 2017-12-29 LAB — LIPID PANEL
CHOL/HDL RATIO: 2.9 ratio (ref 0.0–5.0)
CHOLESTEROL TOTAL: 119 mg/dL (ref 100–199)
HDL: 41 mg/dL (ref >39)
LDL Calculated: 36 mg/dL (ref 0–99)
TRIGLYCERIDES: 211 mg/dL — AB (ref 0–149)
VLDL Cholesterol Cal: 42 mg/dL — ABNORMAL HIGH (ref 5–40)

## 2017-12-30 LAB — SPECIMEN STATUS REPORT

## 2017-12-30 LAB — TSH: TSH: 1.76 u[IU]/mL (ref 0.450–4.500)

## 2017-12-31 DIAGNOSIS — N401 Enlarged prostate with lower urinary tract symptoms: Secondary | ICD-10-CM | POA: Diagnosis not present

## 2017-12-31 DIAGNOSIS — R1084 Generalized abdominal pain: Secondary | ICD-10-CM | POA: Diagnosis not present

## 2017-12-31 DIAGNOSIS — R3121 Asymptomatic microscopic hematuria: Secondary | ICD-10-CM | POA: Diagnosis not present

## 2017-12-31 DIAGNOSIS — N3941 Urge incontinence: Secondary | ICD-10-CM | POA: Diagnosis not present

## 2018-01-05 DIAGNOSIS — N200 Calculus of kidney: Secondary | ICD-10-CM | POA: Diagnosis not present

## 2018-01-05 DIAGNOSIS — R3121 Asymptomatic microscopic hematuria: Secondary | ICD-10-CM | POA: Diagnosis not present

## 2018-01-28 DIAGNOSIS — R3121 Asymptomatic microscopic hematuria: Secondary | ICD-10-CM | POA: Diagnosis not present

## 2018-01-28 DIAGNOSIS — N401 Enlarged prostate with lower urinary tract symptoms: Secondary | ICD-10-CM | POA: Diagnosis not present

## 2018-01-28 DIAGNOSIS — N200 Calculus of kidney: Secondary | ICD-10-CM | POA: Diagnosis not present

## 2018-01-28 DIAGNOSIS — R351 Nocturia: Secondary | ICD-10-CM | POA: Diagnosis not present

## 2018-03-08 DIAGNOSIS — R1084 Generalized abdominal pain: Secondary | ICD-10-CM | POA: Diagnosis not present

## 2018-03-08 DIAGNOSIS — R3121 Asymptomatic microscopic hematuria: Secondary | ICD-10-CM | POA: Diagnosis not present

## 2018-03-08 DIAGNOSIS — N200 Calculus of kidney: Secondary | ICD-10-CM | POA: Diagnosis not present

## 2018-03-09 ENCOUNTER — Telehealth: Payer: Self-pay | Admitting: Family Medicine

## 2018-03-09 ENCOUNTER — Other Ambulatory Visit: Payer: Self-pay | Admitting: Physician Assistant

## 2018-03-09 MED ORDER — PREDNISONE 10 MG (21) PO TBPK: ORAL_TABLET | ORAL | 0 refills | Status: DC

## 2018-03-09 NOTE — Telephone Encounter (Signed)
Patient aware rx sent to pharmacy.  

## 2018-03-09 NOTE — Telephone Encounter (Signed)
Prednisone pack sent

## 2018-03-09 NOTE — Telephone Encounter (Signed)
You may send a refill one time

## 2018-03-09 NOTE — Telephone Encounter (Signed)
AJ put pt on predinsone around June, and it was helping with stone, he went to urologist yesterday and the test shown said that the stone hadn't moved any, pt states that he has constant pain in that kidney and knows that last time the predinsone helped he wants to know if she will refill it, pt is scheduled for 10/4 to see AJ but he is wanting to know if we could refill it without him being seen. Pharmacy [facility]

## 2018-03-09 NOTE — Telephone Encounter (Signed)
What would you like ordered he does not have a current prescription

## 2018-03-11 DIAGNOSIS — L821 Other seborrheic keratosis: Secondary | ICD-10-CM | POA: Diagnosis not present

## 2018-03-11 DIAGNOSIS — L578 Other skin changes due to chronic exposure to nonionizing radiation: Secondary | ICD-10-CM | POA: Diagnosis not present

## 2018-03-11 DIAGNOSIS — L723 Sebaceous cyst: Secondary | ICD-10-CM | POA: Diagnosis not present

## 2018-03-11 DIAGNOSIS — D1801 Hemangioma of skin and subcutaneous tissue: Secondary | ICD-10-CM | POA: Diagnosis not present

## 2018-03-11 DIAGNOSIS — L570 Actinic keratosis: Secondary | ICD-10-CM | POA: Diagnosis not present

## 2018-03-11 DIAGNOSIS — Z85828 Personal history of other malignant neoplasm of skin: Secondary | ICD-10-CM | POA: Diagnosis not present

## 2018-03-15 ENCOUNTER — Ambulatory Visit (INDEPENDENT_AMBULATORY_CARE_PROVIDER_SITE_OTHER): Payer: PPO

## 2018-03-15 DIAGNOSIS — Z23 Encounter for immunization: Secondary | ICD-10-CM | POA: Diagnosis not present

## 2018-03-19 ENCOUNTER — Telehealth: Payer: Self-pay | Admitting: Physician Assistant

## 2018-03-19 ENCOUNTER — Encounter: Payer: Self-pay | Admitting: Physician Assistant

## 2018-03-19 ENCOUNTER — Ambulatory Visit (INDEPENDENT_AMBULATORY_CARE_PROVIDER_SITE_OTHER): Payer: PPO | Admitting: Physician Assistant

## 2018-03-19 VITALS — BP 114/65 | HR 90 | Temp 97.2°F | Ht 64.0 in | Wt 241.0 lb

## 2018-03-19 DIAGNOSIS — E782 Mixed hyperlipidemia: Secondary | ICD-10-CM

## 2018-03-19 DIAGNOSIS — G8929 Other chronic pain: Secondary | ICD-10-CM | POA: Diagnosis not present

## 2018-03-19 DIAGNOSIS — M79671 Pain in right foot: Secondary | ICD-10-CM

## 2018-03-19 DIAGNOSIS — M79672 Pain in left foot: Secondary | ICD-10-CM

## 2018-03-19 DIAGNOSIS — M51369 Other intervertebral disc degeneration, lumbar region without mention of lumbar back pain or lower extremity pain: Secondary | ICD-10-CM

## 2018-03-19 DIAGNOSIS — M5136 Other intervertebral disc degeneration, lumbar region: Secondary | ICD-10-CM | POA: Diagnosis not present

## 2018-03-19 DIAGNOSIS — N200 Calculus of kidney: Secondary | ICD-10-CM | POA: Insufficient documentation

## 2018-03-19 LAB — BAYER DCA HB A1C WAIVED: HB A1C (BAYER DCA - WAIVED): 6.1 % (ref <7.0)

## 2018-03-19 MED ORDER — PREDNISONE 10 MG (21) PO TBPK: ORAL_TABLET | ORAL | 0 refills | Status: DC

## 2018-03-20 LAB — CMP14+EGFR
ALK PHOS: 58 IU/L (ref 39–117)
ALT: 28 IU/L (ref 0–44)
AST: 19 IU/L (ref 0–40)
Albumin/Globulin Ratio: 1.9 (ref 1.2–2.2)
Albumin: 3.9 g/dL (ref 3.5–4.8)
BILIRUBIN TOTAL: 0.7 mg/dL (ref 0.0–1.2)
BUN/Creatinine Ratio: 13 (ref 10–24)
BUN: 13 mg/dL (ref 8–27)
CHLORIDE: 99 mmol/L (ref 96–106)
CO2: 27 mmol/L (ref 20–29)
Calcium: 9.1 mg/dL (ref 8.6–10.2)
Creatinine, Ser: 1.02 mg/dL (ref 0.76–1.27)
GFR calc Af Amer: 83 mL/min/{1.73_m2} (ref >59)
GFR calc non Af Amer: 72 mL/min/{1.73_m2} (ref >59)
Globulin, Total: 2.1 g/dL (ref 1.5–4.5)
Glucose: 120 mg/dL — ABNORMAL HIGH (ref 65–99)
POTASSIUM: 4.1 mmol/L (ref 3.5–5.2)
Sodium: 139 mmol/L (ref 134–144)
Total Protein: 6 g/dL (ref 6.0–8.5)

## 2018-03-20 LAB — CBC WITH DIFFERENTIAL/PLATELET
BASOS ABS: 0.1 10*3/uL (ref 0.0–0.2)
Basos: 1 %
EOS (ABSOLUTE): 0.4 10*3/uL (ref 0.0–0.4)
EOS: 4 %
HEMATOCRIT: 45.2 % (ref 37.5–51.0)
Hemoglobin: 15.2 g/dL (ref 13.0–17.7)
Immature Grans (Abs): 0.1 10*3/uL (ref 0.0–0.1)
Immature Granulocytes: 1 %
LYMPHS ABS: 3.2 10*3/uL — AB (ref 0.7–3.1)
Lymphs: 32 %
MCH: 29.3 pg (ref 26.6–33.0)
MCHC: 33.6 g/dL (ref 31.5–35.7)
MCV: 87 fL (ref 79–97)
MONOS ABS: 1.2 10*3/uL — AB (ref 0.1–0.9)
Monocytes: 12 %
Neutrophils Absolute: 5 10*3/uL (ref 1.4–7.0)
Neutrophils: 50 %
Platelets: 144 10*3/uL — ABNORMAL LOW (ref 150–450)
RBC: 5.19 x10E6/uL (ref 4.14–5.80)
RDW: 12.8 % (ref 12.3–15.4)
WBC: 10 10*3/uL (ref 3.4–10.8)

## 2018-03-20 LAB — LIPID PANEL
CHOLESTEROL TOTAL: 131 mg/dL (ref 100–199)
Chol/HDL Ratio: 3.2 ratio (ref 0.0–5.0)
HDL: 41 mg/dL (ref >39)
LDL Calculated: 41 mg/dL (ref 0–99)
TRIGLYCERIDES: 243 mg/dL — AB (ref 0–149)
VLDL Cholesterol Cal: 49 mg/dL — ABNORMAL HIGH (ref 5–40)

## 2018-03-21 NOTE — Progress Notes (Signed)
BP 114/65   Pulse 90   Temp (!) 97.2 F (36.2 C)   Ht 5' 4"  (1.626 m)   Wt 241 lb (109.3 kg)   BMI 41.37 kg/m    Subjective:    Patient ID: Lonnie Hurley, male    DOB: 09-06-1940, 77 y.o.   MRN: [id number]  HPI: Lonnie Hurley is a 77 y.o. male presenting on 03/19/2018 for Follow-up (back and leg pain)  This patient comes in for periodic recheck on his chronic medical conditions they do include kidney stones, degenerative disc disease, heel pain, hyperlipidemia.  Next  He has been seeing Dr. Jeffie Pollock for his kidney stone.  He still under treatment with him.  He is having significant heel and foot pain that he would like to have investigated.  He would like to have referral performed with [facility].  The patient does also see Dr. Henrene Pastor for his stomach problems.  He does not know when he has supposed to have a colonoscopy but will call Dr. Blanch Media office and get an appointment.  Past Medical History:  Diagnosis Date  . BPH (benign prostatic hypertrophy)    Dr. Rosana Hoes / Dr. Joelyn Oms  - Urologist   . Cancer [facility])    lip-mole surgery  . Cataract   . Colon polyps   . DDD (degenerative disc disease)   . Hearing loss   . Hypercholesteremia   . Hyperlipidemia   . Hypertension   . Leukoplakia   . Microscopic hematuria    negative work up with Urology in the past.   . OAB (overactive bladder)    with past percutaneous tibial nerve stimulation therapy  . Thrombocytopenia ([place])   . Tinnitus   . Tobacco abuse   . Vitamin B 12 deficiency   . Vitamin D deficiency    Relevant past medical, surgical, family and social history reviewed and updated as indicated. Interim medical history since our last visit reviewed. Allergies and medications reviewed and updated. DATA REVIEWED: CHART IN EPIC  Family History reviewed for pertinent findings.  Review of Systems  Constitutional: Negative.  Negative for appetite change and fatigue.  Eyes: Negative for pain and visual disturbance.    Respiratory: Negative.  Negative for cough, chest tightness, shortness of breath and wheezing.   Cardiovascular: Negative.  Negative for chest pain, palpitations and leg swelling.  Gastrointestinal: Negative.  Negative for abdominal pain, diarrhea, nausea and vomiting.  Genitourinary: Positive for difficulty urinating and flank pain.  Musculoskeletal: Positive for arthralgias, back pain and joint swelling.  Skin: Negative.  Negative for color change and rash.  Neurological: Negative.  Negative for weakness, numbness and headaches.  Psychiatric/Behavioral: Negative.     Allergies as of 03/19/2018      Reactions   Penicillins Other (See Comments)   unknown   Aspirin Other (See Comments)   Causes Blood in urine   Crestor [rosuvastatin Calcium] Other (See Comments)   Myalgia, "joint problem"   Gabapentin Other (See Comments)   Blurry vision   Hydrocodone Other (See Comments)   Constipation    Lyrica [pregabalin]    Blurry vision      Medication List        Accurate as of 03/19/18 11:59 PM. Always use your most recent med list.          acetaminophen 500 MG tablet Commonly known as:  TYLENOL Take 500 mg by mouth every 6 (six) hours as needed.   ezetimibe 10 MG tablet Commonly known as:  ZETIA Take 1 tablet (10 mg total) by mouth daily.   Fish Oil 1000 MG Caps Take 1 capsule by mouth daily. 1200   FLOMAX 0.4 MG Caps capsule Generic drug:  tamsulosin Take 0.4 mg by mouth at bedtime.   glucose blood test strip Use as instructed   hydrochlorothiazide 12.5 MG tablet Commonly known as:  HYDRODIURIL Take 1 tablet (12.5 mg total) by mouth daily.   multivitamin with minerals tablet Take 1 tablet by mouth daily.   nystatin-triamcinolone cream Commonly known as:  MYCOLOG II Apply 1 application topically 2 (two) times daily.   predniSONE 10 MG (21) Tbpk tablet Commonly known as:  STERAPRED UNI-PAK 21 TAB As directed x 6 days   ramipril 10 MG capsule Commonly known  as:  ALTACE Take 1 capsule (10 mg total) by mouth daily.   simvastatin 40 MG tablet Commonly known as:  ZOCOR Take 1 tablet (40 mg total) by mouth at bedtime.   Vitamin D3 2000 units Tabs Take 1 tablet by mouth daily.          Objective:    BP 114/65   Pulse 90   Temp (!) 97.2 F (36.2 C)   Ht 5' 4"  (1.626 m)   Wt 241 lb (109.3 kg)   BMI 41.37 kg/m   Allergies  Allergen Reactions  . Penicillins Other (See Comments)    unknown  . Aspirin Other (See Comments)    Causes Blood in urine  . Crestor [Rosuvastatin Calcium] Other (See Comments)    Myalgia, "joint problem"  . Gabapentin Other (See Comments)    Blurry vision   . Hydrocodone Other (See Comments)    Constipation    . Lyrica [Pregabalin]     Blurry vision     Wt Readings from Last 3 Encounters:  03/19/18 241 lb (109.3 kg)  12/28/17 237 lb 6.4 oz (107.7 kg)  12/07/17 236 lb 3.2 oz (107.1 kg)    Physical Exam  Constitutional: He appears well-developed and well-nourished. No distress.  HENT:  Head: Normocephalic and atraumatic.  Eyes: Pupils are equal, round, and reactive to light. Conjunctivae and EOM are normal.  Cardiovascular: Normal rate, regular rhythm and normal heart sounds.  Pulmonary/Chest: Effort normal and breath sounds normal. No respiratory distress.  Skin: Skin is warm and dry.  Psychiatric: He has a normal mood and affect. His behavior is normal.  Nursing note and vitals reviewed.   Results for orders placed or performed in visit on 03/19/18  CBC with Differential/Platelet  Result Value Ref Range   WBC 10.0 3.4 - 10.8 x10E3/uL   RBC 5.19 4.14 - 5.80 x10E6/uL   Hemoglobin 15.2 13.0 - 17.7 g/dL   Hematocrit 45.2 37.5 - 51.0 %   MCV 87 79 - 97 fL   MCH 29.3 26.6 - 33.0 pg   MCHC 33.6 31.5 - 35.7 g/dL   RDW 12.8 12.3 - 15.4 %   Platelets 144 (L) 150 - 450 x10E3/uL   Neutrophils 50 Not Estab. %   Lymphs 32 Not Estab. %   Monocytes 12 Not Estab. %   Eos 4 Not Estab. %   Basos 1 Not  Estab. %   Neutrophils Absolute 5.0 1.4 - 7.0 x10E3/uL   Lymphocytes Absolute 3.2 (H) 0.7 - 3.1 x10E3/uL   Monocytes Absolute 1.2 (H) 0.1 - 0.9 x10E3/uL   EOS (ABSOLUTE) 0.4 0.0 - 0.4 x10E3/uL   Basophils Absolute 0.1 0.0 - 0.2 x10E3/uL   Immature Granulocytes 1 Not Estab. %  Immature Grans (Abs) 0.1 0.0 - 0.1 x10E3/uL  CMP14+EGFR  Result Value Ref Range   Glucose 120 (H) 65 - 99 mg/dL   BUN 13 8 - 27 mg/dL   Creatinine, Ser 1.02 0.76 - 1.27 mg/dL   GFR calc non Af Amer 72 >59 mL/min/1.73   GFR calc Af Amer 83 >59 mL/min/1.73   BUN/Creatinine Ratio 13 10 - 24   Sodium 139 134 - 144 mmol/L   Potassium 4.1 3.5 - 5.2 mmol/L   Chloride 99 96 - 106 mmol/L   CO2 27 20 - 29 mmol/L   Calcium 9.1 8.6 - 10.2 mg/dL   Total Protein 6.0 6.0 - 8.5 g/dL   Albumin 3.9 3.5 - 4.8 g/dL   Globulin, Total 2.1 1.5 - 4.5 g/dL   Albumin/Globulin Ratio 1.9 1.2 - 2.2   Bilirubin Total 0.7 0.0 - 1.2 mg/dL   Alkaline Phosphatase 58 39 - 117 IU/L   AST 19 0 - 40 IU/L   ALT 28 0 - 44 IU/L  Lipid panel  Result Value Ref Range   Cholesterol, Total 131 100 - 199 mg/dL   Triglycerides 243 (H) 0 - 149 mg/dL   HDL 41 >39 mg/dL   VLDL Cholesterol Cal 49 (H) 5 - 40 mg/dL   LDL Calculated 41 0 - 99 mg/dL   Chol/HDL Ratio 3.2 0.0 - 5.0 ratio  Bayer DCA Hb A1c Waived  Result Value Ref Range   HB A1C (BAYER DCA - WAIVED) 6.1 <7.0 %      Assessment & Plan:   1. Kidney stone - CBC with Differential/Platelet - CMP14+EGFR - Lipid panel - Bayer DCA Hb A1c Waived  2. DDD (degenerative disc disease), lumbar - CBC with Differential/Platelet - CMP14+EGFR - Lipid panel - Bayer DCA Hb A1c Waived  3. Heel pain, chronic, right - Ambulatory referral to Orthopedic Surgery  4. Right foot pain - Ambulatory referral to Orthopedic Surgery  5. Mixed hyperlipidemia - Lipid panel   Continue all other maintenance medications as listed above.  Follow up plan: Return in about 2 months (around 05/19/2018) for  recheck.  Educational handout given for [place] PA-C [place] [address]  [place], [place] [address] [phone]   03/21/2018, 9:20 PM

## 2018-03-22 DIAGNOSIS — M2141 Flat foot [pes planus] (acquired), right foot: Secondary | ICD-10-CM | POA: Diagnosis not present

## 2018-03-22 NOTE — Telephone Encounter (Signed)
Lmtcb/ww- please see labs

## 2018-03-27 ENCOUNTER — Other Ambulatory Visit: Payer: Self-pay | Admitting: Physician Assistant

## 2018-03-29 ENCOUNTER — Other Ambulatory Visit: Payer: Self-pay | Admitting: Physician Assistant

## 2018-03-29 MED ORDER — MELOXICAM 7.5 MG PO TABS: 7.5000 mg | ORAL_TABLET | Freq: Every day | ORAL | 2 refills | Status: DC

## 2018-03-29 NOTE — Telephone Encounter (Signed)
Sent refill

## 2018-04-14 ENCOUNTER — Ambulatory Visit (INDEPENDENT_AMBULATORY_CARE_PROVIDER_SITE_OTHER): Payer: PPO

## 2018-04-14 ENCOUNTER — Encounter: Payer: Self-pay | Admitting: Physician Assistant

## 2018-04-14 ENCOUNTER — Ambulatory Visit (INDEPENDENT_AMBULATORY_CARE_PROVIDER_SITE_OTHER): Payer: PPO | Admitting: Physician Assistant

## 2018-04-14 VITALS — BP 116/76 | HR 79 | Temp 97.3°F | Ht 64.0 in | Wt 245.0 lb

## 2018-04-14 DIAGNOSIS — G8929 Other chronic pain: Secondary | ICD-10-CM

## 2018-04-14 DIAGNOSIS — M79672 Pain in left foot: Secondary | ICD-10-CM | POA: Diagnosis not present

## 2018-04-14 DIAGNOSIS — M79671 Pain in right foot: Secondary | ICD-10-CM

## 2018-04-14 DIAGNOSIS — M25561 Pain in right knee: Secondary | ICD-10-CM | POA: Insufficient documentation

## 2018-04-14 DIAGNOSIS — L0591 Pilonidal cyst without abscess: Secondary | ICD-10-CM | POA: Diagnosis not present

## 2018-04-14 DIAGNOSIS — M1711 Unilateral primary osteoarthritis, right knee: Secondary | ICD-10-CM | POA: Diagnosis not present

## 2018-04-14 MED ORDER — CEPHALEXIN 500 MG PO CAPS: 500.0000 mg | ORAL_CAPSULE | Freq: Four times a day (QID) | ORAL | 0 refills | Status: DC

## 2018-04-14 NOTE — Patient Instructions (Signed)
In a few days you may receive a survey in the mail or online from [employer] regarding your visit with us today. Please take a moment to fill this out. Your feedback is very important to our whole office. It can help us better understand your needs as well as improve your experience and satisfaction. Thank you for taking your time to complete it. We care about you.  Atharva Mirsky, PA-C  

## 2018-04-18 NOTE — Progress Notes (Signed)
BP 116/76   Pulse 79   Temp (!) 97.3 F (36.3 C) (Oral)   Ht 5' 4"  (1.626 m)   Wt 245 lb (111.1 kg)   BMI 42.05 kg/m    Subjective:    Patient ID: Lonnie Hurley, male    DOB: 27-Sep-1940, 77 y.o.   MRN: [id number]  HPI: Lonnie Hurley is a 77 y.o. male presenting on 04/14/2018 for Knee Pain and Foot Pain (bilateral )  This patient comes in for periodic recheck on medications and conditions including chronic pain of feet and knees. The right knee is the most painful and we will get an xray today. Already has plan to see orthopedist for feet.  New issues of tenderness and slight drainage in the buttock fold. He has history of pilonidal cyst, but no surgery ever required. Denied fever and chills.   All medications are reviewed today. There are no reports of any problems with the medications. All of the medical conditions are reviewed and updated.  Lab work is reviewed and will be ordered as medically necessary. There are no new problems reported with today's visit.   Past Medical History:  Diagnosis Date  . BPH (benign prostatic hypertrophy)    Dr. Rosana Hoes / Dr. Joelyn Oms  - Urologist   . Cancer [facility])    lip-mole surgery  . Cataract   . Colon polyps   . DDD (degenerative disc disease)   . Hearing loss   . Hypercholesteremia   . Hyperlipidemia   . Hypertension   . Leukoplakia   . Microscopic hematuria    negative work up with Urology in the past.   . OAB (overactive bladder)    with past percutaneous tibial nerve stimulation therapy  . Thrombocytopenia ([place])   . Tinnitus   . Tobacco abuse   . Vitamin B 12 deficiency   . Vitamin D deficiency    Relevant past medical, surgical, family and social history reviewed and updated as indicated. Interim medical history since our last visit reviewed. Allergies and medications reviewed and updated. DATA REVIEWED: CHART IN EPIC  Family History reviewed for pertinent findings.  Review of Systems  Constitutional: Negative.  Negative for  appetite change and fatigue.  HENT: Negative.   Eyes: Negative.  Negative for pain and visual disturbance.  Respiratory: Negative.  Negative for cough, chest tightness, shortness of breath and wheezing.   Cardiovascular: Negative.  Negative for chest pain, palpitations and leg swelling.  Gastrointestinal: Negative.  Negative for abdominal pain, diarrhea, nausea and vomiting.  Endocrine: Negative.   Genitourinary: Negative.   Musculoskeletal: Positive for arthralgias, joint swelling and myalgias.  Skin: Positive for rash. Negative for color change.  Neurological: Negative.  Negative for weakness, numbness and headaches.  Psychiatric/Behavioral: Negative.     Allergies as of 04/14/2018      Reactions   Penicillins Other (See Comments)   unknown   Aspirin Other (See Comments)   Causes Blood in urine   Crestor [rosuvastatin Calcium] Other (See Comments)   Myalgia, "joint problem"   Gabapentin Other (See Comments)   Blurry vision   Hydrocodone Other (See Comments)   Constipation    Lyrica [pregabalin]    Blurry vision      Medication List        Accurate as of 04/14/18 11:59 PM. Always use your most recent med list.          acetaminophen 500 MG tablet Commonly known as:  TYLENOL Take 500 mg by mouth every 6 (  six) hours as needed.   cephALEXin 500 MG capsule Commonly known as:  KEFLEX Take 1 capsule (500 mg total) by mouth 4 (four) times daily.   ezetimibe 10 MG tablet Commonly known as:  ZETIA Take 1 tablet (10 mg total) by mouth daily.   Fish Oil 1000 MG Caps Take 1 capsule by mouth daily. 1200   FLOMAX 0.4 MG Caps capsule Generic drug:  tamsulosin Take 0.4 mg by mouth at bedtime.   glucose blood test strip Use as instructed   hydrochlorothiazide 12.5 MG tablet Commonly known as:  HYDRODIURIL Take 1 tablet (12.5 mg total) by mouth daily.   meloxicam 7.5 MG tablet Commonly known as:  MOBIC Take 1 tablet (7.5 mg total) by mouth daily.   multivitamin with  minerals tablet Take 1 tablet by mouth daily.   nystatin-triamcinolone cream Commonly known as:  MYCOLOG II Apply 1 application topically 2 (two) times daily.   ramipril 10 MG capsule Commonly known as:  ALTACE Take 1 capsule (10 mg total) by mouth daily.   simvastatin 40 MG tablet Commonly known as:  ZOCOR Take 1 tablet (40 mg total) by mouth at bedtime.   Vitamin D3 2000 units Tabs Take 1 tablet by mouth daily.          Objective:    BP 116/76   Pulse 79   Temp (!) 97.3 F (36.3 C) (Oral)   Ht '5\' 4"'$  (1.626 m)   Wt 245 lb (111.1 kg)   BMI 42.05 kg/m   Allergies  Allergen Reactions  . Penicillins Other (See Comments)    unknown  . Aspirin Other (See Comments)    Causes Blood in urine  . Crestor [Rosuvastatin Calcium] Other (See Comments)    Myalgia, "joint problem"  . Gabapentin Other (See Comments)    Blurry vision   . Hydrocodone Other (See Comments)    Constipation    . Lyrica [Pregabalin]     Blurry vision     Wt Readings from Last 3 Encounters:  04/14/18 245 lb (111.1 kg)  03/19/18 241 lb (109.3 kg)  12/28/17 237 lb 6.4 oz (107.7 kg)    Physical Exam  Constitutional: He appears well-developed and well-nourished. No distress.  HENT:  Head: Normocephalic and atraumatic.  Eyes: Pupils are equal, round, and reactive to light. Conjunctivae and EOM are normal.  Cardiovascular: Normal rate, regular rhythm and normal heart sounds.  Pulmonary/Chest: Effort normal and breath sounds normal. No respiratory distress.  Musculoskeletal:       Right knee: He exhibits decreased range of motion and swelling.  Skin: Skin is warm and dry.     Mild redness without drainage or swelling  Psychiatric: He has a normal mood and affect. His behavior is normal.  Nursing note and vitals reviewed.   Results for orders placed or performed in visit on 03/19/18  CBC with Differential/Platelet  Result Value Ref Range   WBC 10.0 3.4 - 10.8 x10E3/uL   RBC 5.19 4.14 -  5.80 x10E6/uL   Hemoglobin 15.2 13.0 - 17.7 g/dL   Hematocrit 45.2 37.5 - 51.0 %   MCV 87 79 - 97 fL   MCH 29.3 26.6 - 33.0 pg   MCHC 33.6 31.5 - 35.7 g/dL   RDW 12.8 12.3 - 15.4 %   Platelets 144 (L) 150 - 450 x10E3/uL   Neutrophils 50 Not Estab. %   Lymphs 32 Not Estab. %   Monocytes 12 Not Estab. %   Eos 4 Not Estab. %  Basos 1 Not Estab. %   Neutrophils Absolute 5.0 1.4 - 7.0 x10E3/uL   Lymphocytes Absolute 3.2 (H) 0.7 - 3.1 x10E3/uL   Monocytes Absolute 1.2 (H) 0.1 - 0.9 x10E3/uL   EOS (ABSOLUTE) 0.4 0.0 - 0.4 x10E3/uL   Basophils Absolute 0.1 0.0 - 0.2 x10E3/uL   Immature Granulocytes 1 Not Estab. %   Immature Grans (Abs) 0.1 0.0 - 0.1 x10E3/uL  CMP14+EGFR  Result Value Ref Range   Glucose 120 (H) 65 - 99 mg/dL   BUN 13 8 - 27 mg/dL   Creatinine, Ser 1.02 0.76 - 1.27 mg/dL   GFR calc non Af Amer 72 >59 mL/min/1.73   GFR calc Af Amer 83 >59 mL/min/1.73   BUN/Creatinine Ratio 13 10 - 24   Sodium 139 134 - 144 mmol/L   Potassium 4.1 3.5 - 5.2 mmol/L   Chloride 99 96 - 106 mmol/L   CO2 27 20 - 29 mmol/L   Calcium 9.1 8.6 - 10.2 mg/dL   Total Protein 6.0 6.0 - 8.5 g/dL   Albumin 3.9 3.5 - 4.8 g/dL   Globulin, Total 2.1 1.5 - 4.5 g/dL   Albumin/Globulin Ratio 1.9 1.2 - 2.2   Bilirubin Total 0.7 0.0 - 1.2 mg/dL   Alkaline Phosphatase 58 39 - 117 IU/L   AST 19 0 - 40 IU/L   ALT 28 0 - 44 IU/L  Lipid panel  Result Value Ref Range   Cholesterol, Total 131 100 - 199 mg/dL   Triglycerides 243 (H) 0 - 149 mg/dL   HDL 41 >39 mg/dL   VLDL Cholesterol Lonnie 49 (H) 5 - 40 mg/dL   LDL Calculated 41 0 - 99 mg/dL   Chol/HDL Ratio 3.2 0.0 - 5.0 ratio  Bayer DCA Hb A1c Waived  Result Value Ref Range   HB A1C (BAYER DCA - WAIVED) 6.1 <7.0 %      Assessment & Plan:   1. Chronic pain of right knee - DG Knee 1-2 Views Right; Future  2. Foot pain, bilateral Continue with ortho  3. Pilonidal cyst without abscess - cephALEXin (KEFLEX) 500 MG capsule; Take 1 capsule (500 mg  total) by mouth 4 (four) times daily.  Dispense: 40 capsule; Refill: 0   Continue all other maintenance medications as listed above.  Follow up plan: Return in about 2 months (around 06/14/2018) for recheck and labs.  Educational handout given for [place] PA-C [place] [address]  [place], [place] [address] [phone]   04/18/2018, 10:41 PM

## 2018-04-19 ENCOUNTER — Ambulatory Visit: Payer: PPO | Admitting: Internal Medicine

## 2018-04-21 ENCOUNTER — Ambulatory Visit (INDEPENDENT_AMBULATORY_CARE_PROVIDER_SITE_OTHER): Payer: PPO | Admitting: Internal Medicine

## 2018-04-21 ENCOUNTER — Encounter: Payer: Self-pay | Admitting: Internal Medicine

## 2018-04-21 VITALS — BP 104/76 | HR 82 | Ht 66.0 in | Wt 244.0 lb

## 2018-04-21 DIAGNOSIS — R141 Gas pain: Secondary | ICD-10-CM

## 2018-04-21 DIAGNOSIS — R140 Abdominal distension (gaseous): Secondary | ICD-10-CM

## 2018-04-21 DIAGNOSIS — E65 Localized adiposity: Secondary | ICD-10-CM

## 2018-04-21 DIAGNOSIS — Z860100 Personal history of colon polyps, unspecified: Secondary | ICD-10-CM

## 2018-04-21 NOTE — Patient Instructions (Addendum)
Per Dr. Henrene Pastor stop your Metamucil.  You may take 1/2 dose of Miralax daily.  You have been given samples of the probiotic Restora.  Take one a day for a month.

## 2018-04-21 NOTE — Progress Notes (Signed)
HISTORY OF PRESENT ILLNESS:  Lonnie Hurley is a 77 y.o. male with past medical history as listed below who presents today complaining of excessive intestinal gas.  He was seen recently by urology and underwent x-ray studies suggesting increased gas.  The issues are bloating and flatulence.  He typically moves his bowels regularly.  He is accompanied by his brother Lonnie Hurley.  Patient thinks that the problem has been worsening over the past year.  No abdominal pain though he does have fullness.  He tells me that he has not gained weight though he remains significantly overweight.  Over the past month he has tried Metamucil with no change.  No change in his diet.  He is currently taking cephalexin for a cutaneous infection.  He has undergone complete colonoscopy in September 2015.  He was found to have diminutive adenomatous colon polyps and moderate diverticulosis for which follow-up in 5 years recommended.  Review of outside blood work from October 2019 finds unremarkable comprehensive metabolic panel except for elevated glucose.  CBC with platelets of 144,000.  Normal hemoglobin of 15.2.  GI review of systems is otherwise negative except for rare rectal itching.  He cannot identify any factors which relieve or exacerbate his bloating.  He feels like improved defecation with help.  REVIEW OF SYSTEMS:  All non-GI ROS negative unless otherwise stated in the HPI except for arthritis, knee pain, itching, muscle cramps, urinary frequency,  Past Medical History:  Diagnosis Date  . BPH (benign prostatic hypertrophy)    Dr. Rosana Hoes / Dr. Joelyn Oms  - Urologist   . Cancer [facility])    lip-mole surgery  . Cataract   . Colon polyps   . DDD (degenerative disc disease)   . Hearing loss   . Hypercholesteremia   . Hyperlipidemia   . Hypertension   . Leukoplakia   . Microscopic hematuria    negative work up with Urology in the past.   . OAB (overactive bladder)    with past percutaneous tibial nerve stimulation therapy   . Thrombocytopenia ([place])   . Tinnitus   . Tobacco abuse   . Vitamin B 12 deficiency   . Vitamin D deficiency     Past Surgical History:  Procedure Laterality Date  . CATARACT EXTRACTION  2007   right eye  . CATARACT EXTRACTION Left 03/21/15  . COLONOSCOPY    . HERNIA REPAIR  1980  . KNEE SURGERY  02-28-2008   replaced inside right knee  . Left Knee Repair  11-10-2006  . NOSE SURGERY    . POLYPECTOMY    . SKIN BIOPSY  10-08-2006   left ear    Social History Lonnie Hurley  reports that he quit smoking about 8 years ago. His smoking use included cigarettes. He has a 50.00 pack-year smoking history. He has never used smokeless tobacco. He reports that he does not drink alcohol or use drugs.  family history includes Diabetes in his brother; Glaucoma in his mother; Heart disease in his brother; Heart failure in his father; Hyperlipidemia in his brother; Hypertension in his brother; Osteoporosis in his brother.  Allergies  Allergen Reactions  . Penicillins Other (See Comments)    unknown  . Aspirin Other (See Comments)    Causes Blood in urine  . Crestor [Rosuvastatin Calcium] Other (See Comments)    Myalgia, "joint problem"  . Gabapentin Other (See Comments)    Blurry vision   . Hydrocodone Other (See Comments)    Constipation    . Lyrica [  Pregabalin]     Blurry vision        PHYSICAL EXAMINATION: Vital signs: BP 104/76   Pulse 82   Ht 5\' 6"  (1.676 m)   Wt 244 lb (110.7 kg)   BMI 39.38 kg/m   Constitutional: Pleasant, obese, generally well-appearing, no acute distress Psychiatric: alert and oriented x3, cooperative Eyes: extraocular movements intact, anicteric, conjunctiva pink Mouth: oral pharynx moist, no lesions Neck: supple no lymphadenopathy Cardiovascular: heart regular rate and rhythm, no murmur Lungs: clear to auscultation bilaterally Abdomen: soft, obese, nontender, nondistended, no obvious ascites, no peritoneal signs, normal bowel sounds, no  organomegaly Rectal: Omitted Extremities: no clubbing, cyanosis, or lower extremity edema bilaterally Skin: no lesions on visible extremities Neuro: No focal deficits.  Cranial nerves intact  ASSESSMENT:  1.  Chronic abdominal bloating without alarm features. 2.  Truncal obesity 3.  History of adenomatous colon polyps   PLAN:  1.  Stop Metamucil 2.  Initiate one half dose of MiraLAX daily to see if increased bowel frequency improves his bloating discomfort as he hopes 3.  Provided with probiotic Restora.  30 days.  Take 1 daily 4.  Discussion on increased intestinal gas bloating.  Literature provided. 5.  Anti-gas and flatulence dietary sheet provided 6.  Surveillance colonoscopy around September 2020 7.  Weight loss.  I believe this would help his discomfort 8.  Ongoing general medical care with PCP

## 2018-04-23 ENCOUNTER — Telehealth: Payer: Self-pay | Admitting: Physician Assistant

## 2018-04-23 NOTE — Telephone Encounter (Signed)
Pt aware x-rays ready for p/u

## 2018-04-30 ENCOUNTER — Other Ambulatory Visit: Payer: Self-pay | Admitting: *Deleted

## 2018-04-30 NOTE — Patient Outreach (Signed)
[place] [facility]) Care Management  04/30/2018  Lonnie Hurley [birth year] [id number]   Telephone Screen  Referral Date: 04/29/18 Referral Source:  Nurse call center Referral Reason:Numbness/tingling -sudden on one side of the body or face - pain in foot, right knee, pricing for MRI and dobbler has kidney issues Doreen Salvage) Referred to PCP office Insurance:HTA   Outreach attempt # 1 successful to the home number Patient is able to verify HIPAA Reviewed and addressed referral to [facility] with patient  He states he is doing "some what better" He reports he stopped by his MD office today, 04/30/18  He denies swelling of his right foot and knee CM discussed the reason to use heat to his foot  He reports he has used bio freeze but has not tried heat yet He used medicines ordered previously with some relief  Saw Dr Henrene Pastor, GI last week dx with chronic abdominal bloating without alarm features recommended miralax and restora  He reports the use tennis ball as a therapist discussed and it has help He has meloxicam (MOBIC) 7.5 MG tablet at pharmacy for inflammation but he has not picked it up because he had just complete a bottle  Refused [facility] RN CM services when offered  He voiced appreciation for the call from [place] for f/u  Social:Mr Pinkham lives alone He continues to drive and is independent with all his care and transportation to medical appointments    Conditions: AAA, hereditary and idiopathic peripheral neuropathy, lumbar spondylosis with myelopathy,, DDD, kidney stone, arthritis, pre diabetes, HTN, BPH, HDL, vitamin D deficiency, chronic right heel pain, constipation  Medications: denies concerns with taking medications as prescribed, affording medications, side effects of medications and questions about medications    Appointments: return to see primary MD 06/29/17    Advance Directives: Denies need for assist with advance directives    Consent: [facility] RN  CM reviewed [facility] services with patient. Patient gave verbal consent for services. He denies need of services from [facility]/Telephonic RN CM, pharmacy, health coach, NP or SW at this time   Plan: [place] CM will close case at this time as patient has been assessed and no needs identified.   [facility] RN CM sent a successful outreach letter as discussed with [facility] brochure enclosed for review  Pt encouraged to return a call to [facility] RN CM prn   Maryln Eastham L. Lavina Hamman, RN, BSN, [place] Coordinator Office number [phone] Mobile number [phone]  Main [facility] number [phone] Fax number [phone]

## 2018-05-03 DIAGNOSIS — R350 Frequency of micturition: Secondary | ICD-10-CM | POA: Diagnosis not present

## 2018-05-03 DIAGNOSIS — R3121 Asymptomatic microscopic hematuria: Secondary | ICD-10-CM | POA: Diagnosis not present

## 2018-05-03 DIAGNOSIS — R351 Nocturia: Secondary | ICD-10-CM | POA: Diagnosis not present

## 2018-05-04 DIAGNOSIS — H02132 Senile ectropion of right lower eyelid: Secondary | ICD-10-CM | POA: Diagnosis not present

## 2018-05-04 DIAGNOSIS — E119 Type 2 diabetes mellitus without complications: Secondary | ICD-10-CM | POA: Diagnosis not present

## 2018-05-04 DIAGNOSIS — H04123 Dry eye syndrome of bilateral lacrimal glands: Secondary | ICD-10-CM | POA: Diagnosis not present

## 2018-05-04 DIAGNOSIS — Z961 Presence of intraocular lens: Secondary | ICD-10-CM | POA: Diagnosis not present

## 2018-05-04 DIAGNOSIS — H16223 Keratoconjunctivitis sicca, not specified as Sjogren's, bilateral: Secondary | ICD-10-CM | POA: Diagnosis not present

## 2018-05-04 LAB — HM DIABETES EYE EXAM

## 2018-05-12 ENCOUNTER — Other Ambulatory Visit: Payer: Self-pay | Admitting: Urology

## 2018-05-12 DIAGNOSIS — M5137 Other intervertebral disc degeneration, lumbosacral region: Secondary | ICD-10-CM

## 2018-05-20 ENCOUNTER — Ambulatory Visit: Payer: PPO | Admitting: Physician Assistant

## 2018-05-25 ENCOUNTER — Ambulatory Visit (HOSPITAL_COMMUNITY)
Admission: RE | Admit: 2018-05-25 | Discharge: 2018-05-25 | Disposition: A | Discharge status: Home / Self Care | Payer: PPO | Source: Ambulatory Visit | Attending: Urology | Admitting: Urology

## 2018-05-25 DIAGNOSIS — M47894 Other spondylosis, thoracic region: Secondary | ICD-10-CM | POA: Insufficient documentation

## 2018-05-25 DIAGNOSIS — M47816 Spondylosis without myelopathy or radiculopathy, lumbar region: Secondary | ICD-10-CM | POA: Diagnosis not present

## 2018-05-25 DIAGNOSIS — M47896 Other spondylosis, lumbar region: Secondary | ICD-10-CM | POA: Insufficient documentation

## 2018-05-25 DIAGNOSIS — M47814 Spondylosis without myelopathy or radiculopathy, thoracic region: Secondary | ICD-10-CM | POA: Diagnosis not present

## 2018-05-25 DIAGNOSIS — M5137 Other intervertebral disc degeneration, lumbosacral region: Secondary | ICD-10-CM

## 2018-06-22 ENCOUNTER — Other Ambulatory Visit: Payer: PPO

## 2018-06-22 DIAGNOSIS — E782 Mixed hyperlipidemia: Secondary | ICD-10-CM

## 2018-06-22 DIAGNOSIS — R7303 Prediabetes: Secondary | ICD-10-CM | POA: Diagnosis not present

## 2018-06-22 DIAGNOSIS — S80862A Insect bite (nonvenomous), left lower leg, initial encounter: Secondary | ICD-10-CM | POA: Diagnosis not present

## 2018-06-22 DIAGNOSIS — I10 Essential (primary) hypertension: Secondary | ICD-10-CM | POA: Diagnosis not present

## 2018-06-22 DIAGNOSIS — W57XXXA Bitten or stung by nonvenomous insect and other nonvenomous arthropods, initial encounter: Secondary | ICD-10-CM | POA: Diagnosis not present

## 2018-06-22 LAB — BAYER DCA HB A1C WAIVED: HB A1C (BAYER DCA - WAIVED): 6.3 % (ref <7.0)

## 2018-06-24 LAB — ROCKY MTN SPOTTED FVR ABS PNL(IGG+IGM)
RMSF IGG: POSITIVE — AB
RMSF IgM: 0.31 index (ref 0.00–0.89)

## 2018-06-24 LAB — LIPID PANEL
CHOL/HDL RATIO: 2.9 ratio (ref 0.0–5.0)
Cholesterol, Total: 103 mg/dL (ref 100–199)
HDL: 35 mg/dL — ABNORMAL LOW (ref >39)
LDL Calculated: 44 mg/dL (ref 0–99)
Triglycerides: 121 mg/dL (ref 0–149)
VLDL Cholesterol Cal: 24 mg/dL (ref 5–40)

## 2018-06-24 LAB — CMP14+EGFR
ALBUMIN: 4.1 g/dL (ref 3.5–4.8)
ALT: 22 IU/L (ref 0–44)
AST: 16 IU/L (ref 0–40)
Albumin/Globulin Ratio: 1.7 (ref 1.2–2.2)
Alkaline Phosphatase: 56 IU/L (ref 39–117)
BUN/Creatinine Ratio: 19 (ref 10–24)
BUN: 18 mg/dL (ref 8–27)
Bilirubin Total: 0.6 mg/dL (ref 0.0–1.2)
CO2: 24 mmol/L (ref 20–29)
CREATININE: 0.95 mg/dL (ref 0.76–1.27)
Calcium: 9.3 mg/dL (ref 8.6–10.2)
Chloride: 103 mmol/L (ref 96–106)
GFR calc Af Amer: 90 mL/min/{1.73_m2} (ref >59)
GFR, EST NON AFRICAN AMERICAN: 78 mL/min/{1.73_m2} (ref >59)
Globulin, Total: 2.4 g/dL (ref 1.5–4.5)
Glucose: 110 mg/dL — ABNORMAL HIGH (ref 65–99)
Potassium: 4.5 mmol/L (ref 3.5–5.2)
Sodium: 143 mmol/L (ref 134–144)
Total Protein: 6.5 g/dL (ref 6.0–8.5)

## 2018-06-24 LAB — CBC WITH DIFFERENTIAL/PLATELET
Basophils Absolute: 0.1 10*3/uL (ref 0.0–0.2)
Basos: 1 %
EOS (ABSOLUTE): 0 10*3/uL (ref 0.0–0.4)
Eos: 0 %
Hematocrit: 46.9 % (ref 37.5–51.0)
Hemoglobin: 15.6 g/dL (ref 13.0–17.7)
IMMATURE GRANULOCYTES: 1 %
Immature Grans (Abs): 0.1 10*3/uL (ref 0.0–0.1)
LYMPHS: 17 %
Lymphocytes Absolute: 1.7 10*3/uL (ref 0.7–3.1)
MCH: 28.9 pg (ref 26.6–33.0)
MCHC: 33.3 g/dL (ref 31.5–35.7)
MCV: 87 fL (ref 79–97)
Monocytes Absolute: 0.6 10*3/uL (ref 0.1–0.9)
Monocytes: 6 %
Neutrophils Absolute: 7.3 10*3/uL — ABNORMAL HIGH (ref 1.4–7.0)
Neutrophils: 75 %
Platelets: 181 10*3/uL (ref 150–450)
RBC: 5.39 x10E6/uL (ref 4.14–5.80)
RDW: 13 % (ref 11.6–15.4)
WBC: 9.7 10*3/uL (ref 3.4–10.8)

## 2018-06-24 LAB — LYME AB/WESTERN BLOT REFLEX
LYME DISEASE AB, QUANT, IGM: <0.8 index (ref 0.00–0.79)
Lyme IgG/IgM Ab: <0.91 {ISR} (ref 0.00–0.90)

## 2018-06-24 LAB — RMSF, IGG, IFA: RMSF, IGG, IFA: 1:64 {titer} — ABNORMAL HIGH

## 2018-06-29 ENCOUNTER — Encounter: Payer: Self-pay | Admitting: Physician Assistant

## 2018-06-29 ENCOUNTER — Ambulatory Visit (INDEPENDENT_AMBULATORY_CARE_PROVIDER_SITE_OTHER): Payer: PPO | Admitting: Physician Assistant

## 2018-06-29 VITALS — BP 114/69 | HR 81 | Temp 97.0°F | Ht 66.0 in | Wt 250.0 lb

## 2018-06-29 DIAGNOSIS — I10 Essential (primary) hypertension: Secondary | ICD-10-CM

## 2018-06-29 DIAGNOSIS — A770 Spotted fever due to Rickettsia rickettsii: Secondary | ICD-10-CM | POA: Diagnosis not present

## 2018-06-29 DIAGNOSIS — E785 Hyperlipidemia, unspecified: Secondary | ICD-10-CM | POA: Diagnosis not present

## 2018-06-29 MED ORDER — RAMIPRIL 10 MG PO CAPS: 10.0000 mg | ORAL_CAPSULE | Freq: Every day | ORAL | 3 refills | Status: DC

## 2018-06-29 MED ORDER — EZETIMIBE 10 MG PO TABS: 10.0000 mg | ORAL_TABLET | Freq: Every day | ORAL | 3 refills | Status: DC

## 2018-06-29 MED ORDER — HYDROCHLOROTHIAZIDE 12.5 MG PO CAPS: 12.5000 mg | ORAL_CAPSULE | Freq: Every day | ORAL | 3 refills | Status: DC

## 2018-06-29 MED ORDER — DOXYCYCLINE HYCLATE 100 MG PO TABS: 100.0000 mg | ORAL_TABLET | Freq: Two times a day (BID) | ORAL | 0 refills | Status: DC

## 2018-06-29 MED ORDER — SIMVASTATIN 40 MG PO TABS: 40.0000 mg | ORAL_TABLET | Freq: Every day | ORAL | 3 refills | Status: DC

## 2018-06-29 MED ORDER — GLUCOSE BLOOD VI STRP: ORAL_STRIP | 11 refills | Status: DC

## 2018-06-30 DIAGNOSIS — M5416 Radiculopathy, lumbar region: Secondary | ICD-10-CM | POA: Diagnosis not present

## 2018-06-30 DIAGNOSIS — M170 Bilateral primary osteoarthritis of knee: Secondary | ICD-10-CM | POA: Diagnosis not present

## 2018-07-02 DIAGNOSIS — A770 Spotted fever due to Rickettsia rickettsii: Secondary | ICD-10-CM | POA: Insufficient documentation

## 2018-07-02 NOTE — Progress Notes (Signed)
This patient comes in for periodic recheck on his chronic medical conditions.  He is still being followed by Dr. Buddy Duty for his diabetes.  He had a very good A1c on our labs this week.  He also had a good reading with him.  He is still having some back and head aching.  Also some low back pain and neuropathy down his legs.  Mainly goes down his right leg.  He is trying to continue physical therapy exercises at home.  He still going to the [facility] for weightbearing exercise.  All of his recent labs have been reviewed and he did have a positive document as well as a fever that showed an old infection.  We did have him take a 14-day course of doxycycline.  And I will recheck him in 3 to 4 months.    BP 114/69   Pulse 81   Temp (!) 97 F (36.1 C) (Oral)   Ht 5' 6"  (1.676 m)   Wt 250 lb (113.4 kg)   BMI 40.35 kg/m    Subjective:    Patient ID: Lonnie Hurley, male    DOB: 02-19-1941, 78 y.o.   MRN: [id number]  HPI: Lonnie Hurley is a 78 y.o. male presenting on 06/29/2018 for Medical Management of Chronic Issues (6 month follow up )    Past Medical History:  Diagnosis Date  . BPH (benign prostatic hypertrophy)    Dr. Rosana Hoes / Dr. Joelyn Oms  - Urologist   . Cancer [facility])    lip-mole surgery  . Cataract   . Colon polyps   . DDD (degenerative disc disease)   . Hearing loss   . Hypercholesteremia   . Hyperlipidemia   . Hypertension   . Leukoplakia   . Microscopic hematuria    negative work up with Urology in the past.   . OAB (overactive bladder)    with past percutaneous tibial nerve stimulation therapy  . Thrombocytopenia ([place])   . Tinnitus   . Tobacco abuse   . Vitamin B 12 deficiency   . Vitamin D deficiency    Relevant past medical, surgical, family and social history reviewed and updated as indicated. Interim medical history since our last visit reviewed. Allergies and medications reviewed and updated. DATA REVIEWED: CHART IN EPIC  Family History reviewed for pertinent  findings.  Review of Systems  Allergies as of 06/29/2018      Reactions   Penicillins Other (See Comments)   unknown   Aspirin Other (See Comments)   Causes Blood in urine   Crestor [rosuvastatin Calcium] Other (See Comments)   Myalgia, "joint problem"   Gabapentin Other (See Comments)   Blurry vision   Hydrocodone Other (See Comments)   Constipation    Lyrica [pregabalin]    Blurry vision      Medication List       Accurate as of June 29, 2018 11:59 PM. Always use your most recent med list.        acetaminophen 500 MG tablet Commonly known as:  TYLENOL Take 500 mg by mouth every 6 (six) hours as needed.   CALCIUM 600 + D PO Take by mouth 2 (two) times daily.   doxycycline 100 MG tablet Commonly known as:  VIBRA-TABS Take 1 tablet (100 mg total) by mouth 2 (two) times daily. 1 po bid   ezetimibe 10 MG tablet Commonly known as:  ZETIA Take 1 tablet (10 mg total) by mouth daily.   Fish Oil 1000 MG Caps Take 1  capsule by mouth daily. 1200   FLOMAX 0.4 MG Caps capsule Generic drug:  tamsulosin Take 0.4 mg by mouth at bedtime.   GLUCOSAMINE 1500 COMPLEX PO Take by mouth 2 (two) times daily.   glucose blood test strip Commonly known as:  ONE TOUCH ULTRA TEST Use as instructed   hydrochlorothiazide 12.5 MG capsule Commonly known as:  MICROZIDE Take 1 capsule (12.5 mg total) by mouth daily.   meloxicam 7.5 MG tablet Commonly known as:  MOBIC Take 1 tablet (7.5 mg total) by mouth daily.   METAMUCIL FIBER PO Take by mouth.   MIRALAX powder Generic drug:  polyethylene glycol powder Take 1 Container by mouth as needed.   multivitamin with minerals tablet Take 1 tablet by mouth daily.   nystatin-triamcinolone cream Commonly known as:  MYCOLOG II Apply 1 application topically 2 (two) times daily.   ramipril 10 MG capsule Commonly known as:  ALTACE Take 1 capsule (10 mg total) by mouth daily.   simvastatin 40 MG tablet Commonly known as:   ZOCOR Take 1 tablet (40 mg total) by mouth at bedtime.   Vitamin D3 50 MCG (2000 UT) Tabs Take 1 tablet by mouth daily.          Objective:    BP 114/69   Pulse 81   Temp (!) 97 F (36.1 C) (Oral)   Ht 5' 6"  (1.676 m)   Wt 250 lb (113.4 kg)   BMI 40.35 kg/m   Allergies  Allergen Reactions  . Penicillins Other (See Comments)    unknown  . Aspirin Other (See Comments)    Causes Blood in urine  . Crestor [Rosuvastatin Calcium] Other (See Comments)    Myalgia, "joint problem"  . Gabapentin Other (See Comments)    Blurry vision   . Hydrocodone Other (See Comments)    Constipation    . Lyrica [Pregabalin]     Blurry vision     Wt Readings from Last 3 Encounters:  06/29/18 250 lb (113.4 kg)  04/21/18 244 lb (110.7 kg)  04/14/18 245 lb (111.1 kg)    Physical Exam  Results for orders placed or performed in visit on 06/22/18  CBC with Differential/Platelet  Result Value Ref Range   WBC 9.7 3.4 - 10.8 x10E3/uL   RBC 5.39 4.14 - 5.80 x10E6/uL   Hemoglobin 15.6 13.0 - 17.7 g/dL   Hematocrit 46.9 37.5 - 51.0 %   MCV 87 79 - 97 fL   MCH 28.9 26.6 - 33.0 pg   MCHC 33.3 31.5 - 35.7 g/dL   RDW 13.0 11.6 - 15.4 %   Platelets 181 150 - 450 x10E3/uL   Neutrophils 75 Not Estab. %   Lymphs 17 Not Estab. %   Monocytes 6 Not Estab. %   Eos 0 Not Estab. %   Basos 1 Not Estab. %   Neutrophils Absolute 7.3 (H) 1.4 - 7.0 x10E3/uL   Lymphocytes Absolute 1.7 0.7 - 3.1 x10E3/uL   Monocytes Absolute 0.6 0.1 - 0.9 x10E3/uL   EOS (ABSOLUTE) 0.0 0.0 - 0.4 x10E3/uL   Basophils Absolute 0.1 0.0 - 0.2 x10E3/uL   Immature Granulocytes 1 Not Estab. %   Immature Grans (Abs) 0.1 0.0 - 0.1 x10E3/uL  CMP14+EGFR  Result Value Ref Range   Glucose 110 (H) 65 - 99 mg/dL   BUN 18 8 - 27 mg/dL   Creatinine, Ser 0.95 0.76 - 1.27 mg/dL   GFR calc non Af Amer 78 >59 mL/min/1.73   GFR calc  Af Amer 90 >59 mL/min/1.73   BUN/Creatinine Ratio 19 10 - 24   Sodium 143 134 - 144 mmol/L   Potassium  4.5 3.5 - 5.2 mmol/L   Chloride 103 96 - 106 mmol/L   CO2 24 20 - 29 mmol/L   Calcium 9.3 8.6 - 10.2 mg/dL   Total Protein 6.5 6.0 - 8.5 g/dL   Albumin 4.1 3.5 - 4.8 g/dL   Globulin, Total 2.4 1.5 - 4.5 g/dL   Albumin/Globulin Ratio 1.7 1.2 - 2.2   Bilirubin Total 0.6 0.0 - 1.2 mg/dL   Alkaline Phosphatase 56 39 - 117 IU/L   AST 16 0 - 40 IU/L   ALT 22 0 - 44 IU/L  Lipid panel  Result Value Ref Range   Cholesterol, Total 103 100 - 199 mg/dL   Triglycerides 121 0 - 149 mg/dL   HDL 35 (L) >39 mg/dL   VLDL Cholesterol Cal 24 5 - 40 mg/dL   LDL Calculated 44 0 - 99 mg/dL   Chol/HDL Ratio 2.9 0.0 - 5.0 ratio  Bayer DCA Hb A1c Waived  Result Value Ref Range   HB A1C (BAYER DCA - WAIVED) 6.3 <7.0 %  Rocky mtn spotted fvr abs pnl(IgG+IgM)  Result Value Ref Range   RMSF IgG Positive (A) Negative   RMSF IgM 0.31 0.00 - 0.89 index  Lyme Ab/Western Blot Reflex  Result Value Ref Range   Lyme IgG/IgM Ab <0.91 0.00 - 0.90 ISR   LYME DISEASE AB, QUANT, IGM <0.80 0.00 - 0.79 index  RMSF, IgG, IFA  Result Value Ref Range   RMSF, IGG, IFA 1:64 (H) Neg <1:64      Assessment & Plan:   1. Hyperlipidemia, unspecified hyperlipidemia type - ezetimibe (ZETIA) 10 MG tablet; Take 1 tablet (10 mg total) by mouth daily.  Dispense: 90 tablet; Refill: 3 - simvastatin (ZOCOR) 40 MG tablet; Take 1 tablet (40 mg total) by mouth at bedtime.  Dispense: 90 tablet; Refill: 3  2. Essential hypertension - ramipril (ALTACE) 10 MG capsule; Take 1 capsule (10 mg total) by mouth daily.  Dispense: 90 capsule; Refill: 3  3. RMSF [facility] spotted fever) - doxycycline (VIBRA-TABS) 100 MG tablet; Take 1 tablet (100 mg total) by mouth 2 (two) times daily. 1 po bid  Dispense: 28 tablet; Refill: 0    Continue all other maintenance medications as listed above.  Follow up plan: Return in about 3 months (around 09/28/2018).  Educational handout given for [place] PA-C [place] [address]  [place], [place] [address] [phone]   07/02/2018, 8:22 AM

## 2018-07-20 ENCOUNTER — Ambulatory Visit (INDEPENDENT_AMBULATORY_CARE_PROVIDER_SITE_OTHER): Payer: PPO | Admitting: Physician Assistant

## 2018-07-20 ENCOUNTER — Encounter: Payer: Self-pay | Admitting: Physician Assistant

## 2018-07-20 VITALS — BP 113/66 | HR 74 | Temp 97.0°F | Ht 66.0 in | Wt 248.6 lb

## 2018-07-20 DIAGNOSIS — M25551 Pain in right hip: Secondary | ICD-10-CM | POA: Diagnosis not present

## 2018-07-25 NOTE — Progress Notes (Signed)
BP 113/66   Pulse 74   Temp (!) 97 F (36.1 C) (Oral)   Ht _0  (1.676 m)   Wt 248 lb 9.6 oz (112.8 kg)   BMI 40.13 kg/m    Subjective:    Patient ID: Lonnie Hurley, male    DOB: [birth year], 78 y.o.   MRN: [id number]  HPI: Lonnie Hurley is a 78 y.o. male presenting on 07/20/2018 for Hip Pain (right ) Having decreased ROM in right hip. He has known DDD and DJD of the knee.  This hip discomfort has gotten much worse. States he has never really gotten his hip evaluated. Pain severe after being up on it, some relief with laying down   Past Medical History:  Diagnosis Date  . BPH (benign prostatic hypertrophy)    Dr. Rosana Hoes / Dr. Joelyn Oms  - Urologist   . Cancer [facility])    lip-mole surgery  . Cataract   . Colon polyps   . DDD (degenerative disc disease)   . Hearing loss   . Hypercholesteremia   . Hyperlipidemia   . Hypertension   . Leukoplakia   . Microscopic hematuria    negative work up with Urology in the past.   . OAB (overactive bladder)    with past percutaneous tibial nerve stimulation therapy  . Thrombocytopenia ([place])   . Tinnitus   . Tobacco abuse   . Vitamin B 12 deficiency   . Vitamin D deficiency    Relevant past medical, surgical, family and social history reviewed and updated as indicated. Interim medical history since our last visit reviewed. Allergies and medications reviewed and updated. DATA REVIEWED: CHART IN EPIC  Family History reviewed for pertinent findings.  Review of Systems  Constitutional: Negative.  Negative for appetite change and fatigue.  Eyes: Negative for pain and visual disturbance.  Respiratory: Negative.  Negative for cough, chest tightness, shortness of breath and wheezing.   Cardiovascular: Negative.  Negative for chest pain, palpitations and leg swelling.  Gastrointestinal: Negative.  Negative for abdominal pain, diarrhea, nausea and vomiting.  Genitourinary: Negative.   Musculoskeletal: Positive for arthralgias, gait problem,  joint swelling and myalgias.  Skin: Negative.  Negative for color change and rash.  Neurological: Negative for weakness, numbness and headaches.  Psychiatric/Behavioral: Negative.     Allergies as of 07/20/2018      Reactions   Penicillins Other (See Comments)   unknown   Aspirin Other (See Comments)   Causes Blood in urine   Crestor [rosuvastatin Calcium] Other (See Comments)   Myalgia, "joint problem"   Gabapentin Other (See Comments)   Blurry vision   Hydrocodone Other (See Comments)   Constipation    Lyrica [pregabalin]    Blurry vision      Medication List       Accurate as of July 20, 2018 11:59 PM. Always use your most recent med list.        acetaminophen 500 MG tablet Commonly known as:  TYLENOL Take 500 mg by mouth every 6 (six) hours as needed.   CALCIUM 600 + D PO Take by mouth 2 (two) times daily.   ezetimibe 10 MG tablet Commonly known as:  ZETIA Take 1 tablet (10 mg total) by mouth daily.   Fish Oil 1000 MG Caps Take 1 capsule by mouth daily. 1200   FLOMAX 0.4 MG Caps capsule Generic drug:  tamsulosin Take 0.4 mg by mouth at bedtime.   GLUCOSAMINE 1500 COMPLEX PO Take by mouth 2 (two) times  daily.   glucose blood test strip Commonly known as:  ONE TOUCH ULTRA TEST Use as instructed   hydrochlorothiazide 12.5 MG capsule Commonly known as:  MICROZIDE Take 1 capsule (12.5 mg total) by mouth daily.   meloxicam 7.5 MG tablet Commonly known as:  MOBIC Take 1 tablet (7.5 mg total) by mouth daily.   METAMUCIL FIBER PO Take by mouth.   MIRALAX powder Generic drug:  polyethylene glycol powder Take 1 Container by mouth as needed.   multivitamin with minerals tablet Take 1 tablet by mouth daily.   nystatin-triamcinolone cream Commonly known as:  MYCOLOG II Apply 1 application topically 2 (two) times daily.   ramipril 10 MG capsule Commonly known as:  ALTACE Take 1 capsule (10 mg total) by mouth daily.   simvastatin 40 MG  tablet Commonly known as:  ZOCOR Take 1 tablet (40 mg total) by mouth at bedtime.   Vitamin D3 50 MCG (2000 UT) Tabs Take 1 tablet by mouth daily.          Objective:    BP 113/66   Pulse 74   Temp (!) 97 F (36.1 C) (Oral)   Ht _0  (1.676 m)   Wt 248 lb 9.6 oz (112.8 kg)   BMI 40.13 kg/m   Allergies  Allergen Reactions  . Penicillins Other (See Comments)    unknown  . Aspirin Other (See Comments)    Causes Blood in urine  . Crestor [Rosuvastatin Calcium] Other (See Comments)    Myalgia, "joint problem"  . Gabapentin Other (See Comments)    Blurry vision   . Hydrocodone Other (See Comments)    Constipation    . Lyrica [Pregabalin]     Blurry vision     Wt Readings from Last 3 Encounters:  07/20/18 248 lb 9.6 oz (112.8 kg)  06/29/18 250 lb (113.4 kg)  04/21/18 244 lb (110.7 kg)    Physical Exam Vitals signs and nursing note reviewed.  Constitutional:      General: He is not in acute distress.    Appearance: He is well-developed.  HENT:     Head: Normocephalic and atraumatic.  Eyes:     Conjunctiva/sclera: Conjunctivae normal.     Pupils: Pupils are equal, round, and reactive to light.  Cardiovascular:     Rate and Rhythm: Normal rate and regular rhythm.     Heart sounds: Normal heart sounds.  Pulmonary:     Effort: Pulmonary effort is normal. No respiratory distress.     Breath sounds: Normal breath sounds.  Musculoskeletal:     Right hip: He exhibits decreased range of motion and tenderness.       Legs:  Skin:    General: Skin is warm and dry.  Psychiatric:        Behavior: Behavior normal.     Results for orders placed or performed in visit on 06/22/18  CBC with Differential/Platelet  Result Value Ref Range   WBC 9.7 3.4 - 10.8 x10E3/uL   RBC 5.39 4.14 - 5.80 x10E6/uL   Hemoglobin 15.6 13.0 - 17.7 g/dL   Hematocrit 46.9 37.5 - 51.0 %   MCV 87 79 - 97 fL   MCH 28.9 26.6 - 33.0 pg   MCHC 33.3 31.5 - 35.7 g/dL   RDW 13.0 11.6 - 15.4 %    Platelets 181 150 - 450 x10E3/uL   Neutrophils 75 Not Estab. %   Lymphs 17 Not Estab. %   Monocytes 6 Not Estab. %  Eos 0 Not Estab. %   Basos 1 Not Estab. %   Neutrophils Absolute 7.3 (H) 1.4 - 7.0 x10E3/uL   Lymphocytes Absolute 1.7 0.7 - 3.1 x10E3/uL   Monocytes Absolute 0.6 0.1 - 0.9 x10E3/uL   EOS (ABSOLUTE) 0.0 0.0 - 0.4 x10E3/uL   Basophils Absolute 0.1 0.0 - 0.2 x10E3/uL   Immature Granulocytes 1 Not Estab. %   Immature Grans (Abs) 0.1 0.0 - 0.1 x10E3/uL  CMP14+EGFR  Result Value Ref Range   Glucose 110 (H) 65 - 99 mg/dL   BUN 18 8 - 27 mg/dL   Creatinine, Ser 0.95 0.76 - 1.27 mg/dL   GFR calc non Af Amer 78 >59 mL/min/1.73   GFR calc Af Amer 90 >59 mL/min/1.73   BUN/Creatinine Ratio 19 10 - 24   Sodium 143 134 - 144 mmol/L   Potassium 4.5 3.5 - 5.2 mmol/L   Chloride 103 96 - 106 mmol/L   CO2 24 20 - 29 mmol/L   Calcium 9.3 8.6 - 10.2 mg/dL   Total Protein 6.5 6.0 - 8.5 g/dL   Albumin 4.1 3.5 - 4.8 g/dL   Globulin, Total 2.4 1.5 - 4.5 g/dL   Albumin/Globulin Ratio 1.7 1.2 - 2.2   Bilirubin Total 0.6 0.0 - 1.2 mg/dL   Alkaline Phosphatase 56 39 - 117 IU/L   AST 16 0 - 40 IU/L   ALT 22 0 - 44 IU/L  Lipid panel  Result Value Ref Range   Cholesterol, Total 103 100 - 199 mg/dL   Triglycerides 121 0 - 149 mg/dL   HDL 35 (L) >39 mg/dL   VLDL Cholesterol Cal 24 5 - 40 mg/dL   LDL Calculated 44 0 - 99 mg/dL   Chol/HDL Ratio 2.9 0.0 - 5.0 ratio  Bayer DCA Hb A1c Waived  Result Value Ref Range   HB A1C (BAYER DCA - WAIVED) 6.3 <7.0 %  Rocky mtn spotted fvr abs pnl(IgG+IgM)  Result Value Ref Range   RMSF IgG Positive (A) Negative   RMSF IgM 0.31 0.00 - 0.89 index  Lyme Ab/Western Blot Reflex  Result Value Ref Range   Lyme IgG/IgM Ab <0.91 0.00 - 0.90 ISR   LYME DISEASE AB, QUANT, IGM <0.80 0.00 - 0.79 index  RMSF, IgG, IFA  Result Value Ref Range   RMSF, IGG, IFA 1:64 (H) Neg <1:64      Assessment & Plan:   1. Pain of right hip joint - Ambulatory referral  to Orthopedic Surgery   Continue all other maintenance medications as listed above.  Follow up plan: No follow-ups on file.  Educational handout given for [place] PA-C [place] [address]  [place], [place] [address] [phone]   07/25/2018, 8:41 PM

## 2018-08-02 DIAGNOSIS — N401 Enlarged prostate with lower urinary tract symptoms: Secondary | ICD-10-CM | POA: Diagnosis not present

## 2018-08-02 DIAGNOSIS — R350 Frequency of micturition: Secondary | ICD-10-CM | POA: Diagnosis not present

## 2018-08-02 DIAGNOSIS — N3941 Urge incontinence: Secondary | ICD-10-CM | POA: Diagnosis not present

## 2018-08-04 DIAGNOSIS — M1711 Unilateral primary osteoarthritis, right knee: Secondary | ICD-10-CM | POA: Diagnosis not present

## 2018-08-04 DIAGNOSIS — M25551 Pain in right hip: Secondary | ICD-10-CM | POA: Diagnosis not present

## 2018-08-10 ENCOUNTER — Other Ambulatory Visit: Payer: Self-pay | Admitting: *Deleted

## 2018-08-10 MED ORDER — POLYETHYLENE GLYCOL 3350 17 GM/SCOOP PO POWD: 1.0000 | ORAL | 2 refills | Status: DC | PRN

## 2018-08-10 NOTE — Telephone Encounter (Signed)
Pt aware sent to pharmacy 

## 2018-08-16 ENCOUNTER — Ambulatory Visit (INDEPENDENT_AMBULATORY_CARE_PROVIDER_SITE_OTHER): Payer: PPO | Admitting: Physician Assistant

## 2018-08-16 ENCOUNTER — Encounter: Payer: Self-pay | Admitting: Physician Assistant

## 2018-08-16 VITALS — BP 119/70 | HR 68 | Temp 97.3°F | Ht 66.0 in | Wt 246.2 lb

## 2018-08-16 DIAGNOSIS — J209 Acute bronchitis, unspecified: Secondary | ICD-10-CM

## 2018-08-16 MED ORDER — DOXYCYCLINE HYCLATE 100 MG PO TABS: 100.0000 mg | ORAL_TABLET | Freq: Two times a day (BID) | ORAL | 0 refills | Status: DC

## 2018-08-16 MED ORDER — PREDNISONE 10 MG (21) PO TBPK: ORAL_TABLET | ORAL | 0 refills | Status: DC

## 2018-08-16 NOTE — Progress Notes (Signed)
BP 119/70   Pulse 68   Temp (!) 97.3 F (36.3 C) (Oral)   Ht 5\' 6"  (1.676 m)   Wt 246 lb 3.2 oz (111.7 kg)   BMI 39.74 kg/m    Subjective:    Patient ID: Lonnie Hurley, male    DOB: May 08, 1941, 78 y.o.   MRN: [id number]  HPI: Rashard Hurley is a 78 y.o. male presenting on 08/16/2018 for Cough (started 5 days ago) and Nasal Congestion  Patient with several days of progressing upper respiratory and bronchial symptoms. Initially there was more upper respiratory congestion. This progressed to having significant cough that is productive throughout the day and severe at night. There is occasional wheezing after coughing. Sometimes there is slight dyspnea on exertion. It is productive mucus that is yellow in color. Denies any blood.   Past Medical History:  Diagnosis Date  . BPH (benign prostatic hypertrophy)    Dr. Rosana Hoes / Dr. Joelyn Oms  - Urologist   . Cancer [facility])    lip-mole surgery  . Cataract   . Colon polyps   . DDD (degenerative disc disease)   . Hearing loss   . Hypercholesteremia   . Hyperlipidemia   . Hypertension   . Leukoplakia   . Microscopic hematuria    negative work up with Urology in the past.   . OAB (overactive bladder)    with past percutaneous tibial nerve stimulation therapy  . Thrombocytopenia ([place])   . Tinnitus   . Tobacco abuse   . Vitamin B 12 deficiency   . Vitamin D deficiency    Relevant past medical, surgical, family and social history reviewed and updated as indicated. Interim medical history since our last visit reviewed. Allergies and medications reviewed and updated. DATA REVIEWED: CHART IN EPIC  Family History reviewed for pertinent findings.  Review of Systems  Constitutional: Positive for activity change, fatigue and fever. Negative for appetite change.  HENT: Positive for congestion, sinus pain and sore throat. Negative for sinus pressure.   Eyes: Negative.  Negative for pain and visual disturbance.  Respiratory: Positive for cough and  wheezing. Negative for chest tightness and shortness of breath.   Cardiovascular: Negative.  Negative for chest pain, palpitations and leg swelling.  Gastrointestinal: Positive for nausea. Negative for abdominal pain, diarrhea and vomiting.  Endocrine: Negative.   Genitourinary: Negative.   Musculoskeletal: Positive for back pain and myalgias. Negative for arthralgias.  Skin: Negative.  Negative for color change and rash.  Neurological: Positive for headaches. Negative for weakness and numbness.  Psychiatric/Behavioral: Negative.     Allergies as of 08/16/2018      Reactions   Penicillins Other (See Comments)   unknown   Aspirin Other (See Comments)   Causes Blood in urine   Crestor [rosuvastatin Calcium] Other (See Comments)   Myalgia, "joint problem"   Gabapentin Other (See Comments)   Blurry vision   Hydrocodone Other (See Comments)   Constipation    Lyrica [pregabalin]    Blurry vision      Medication List       Accurate as of August 16, 2018  9:05 AM. Always use your most recent med list.        acetaminophen 500 MG tablet Commonly known as:  TYLENOL Take 500 mg by mouth every 6 (six) hours as needed.   CALCIUM 600 + D PO Take by mouth 2 (two) times daily.   doxycycline 100 MG tablet Commonly known as:  VIBRA-TABS Take 1 tablet (100 mg  total) by mouth 2 (two) times daily. 1 po bid   ezetimibe 10 MG tablet Commonly known as:  ZETIA Take 1 tablet (10 mg total) by mouth daily.   Fish Oil 1000 MG Caps Take 1 capsule by mouth daily. 1200   FLOMAX 0.4 MG Caps capsule Generic drug:  tamsulosin Take 0.4 mg by mouth at bedtime.   GLUCOSAMINE 1500 COMPLEX PO Take by mouth 2 (two) times daily.   glucose blood test strip Commonly known as:  ONE TOUCH ULTRA TEST Use as instructed   hydrochlorothiazide 12.5 MG capsule Commonly known as:  MICROZIDE Take 1 capsule (12.5 mg total) by mouth daily.   meloxicam 7.5 MG tablet Commonly known as:  MOBIC Take 1 tablet  (7.5 mg total) by mouth daily.   METAMUCIL FIBER PO Take by mouth.   multivitamin with minerals tablet Take 1 tablet by mouth daily.   nystatin-triamcinolone cream Commonly known as:  MYCOLOG II Apply 1 application topically 2 (two) times daily.   polyethylene glycol powder powder Commonly known as:  MIRALAX Take 255 g by mouth as needed.   predniSONE 10 MG (21) Tbpk tablet Commonly known as:  STERAPRED UNI-PAK 21 TAB As directed x 6 days   ramipril 10 MG capsule Commonly known as:  ALTACE Take 1 capsule (10 mg total) by mouth daily.   simvastatin 40 MG tablet Commonly known as:  ZOCOR Take 1 tablet (40 mg total) by mouth at bedtime.   Vitamin D3 50 MCG (2000 UT) Tabs Take 1 tablet by mouth daily.          Objective:    BP 119/70   Pulse 68   Temp (!) 97.3 F (36.3 C) (Oral)   Ht 5\' 6"  (1.676 m)   Wt 246 lb 3.2 oz (111.7 kg)   BMI 39.74 kg/m   Allergies  Allergen Reactions  . Penicillins Other (See Comments)    unknown  . Aspirin Other (See Comments)    Causes Blood in urine  . Crestor [Rosuvastatin Calcium] Other (See Comments)    Myalgia, "joint problem"  . Gabapentin Other (See Comments)    Blurry vision   . Hydrocodone Other (See Comments)    Constipation    . Lyrica [Pregabalin]     Blurry vision     Wt Readings from Last 3 Encounters:  08/16/18 246 lb 3.2 oz (111.7 kg)  07/20/18 248 lb 9.6 oz (112.8 kg)  06/29/18 250 lb (113.4 kg)    Physical Exam Constitutional:      Appearance: He is well-developed.  HENT:     Head: Normocephalic and atraumatic.     Right Ear: Hearing and tympanic membrane normal.     Left Ear: Hearing and tympanic membrane normal.     Nose: Mucosal edema present. No nasal deformity.     Right Sinus: Frontal sinus tenderness present.     Left Sinus: Frontal sinus tenderness present.     Mouth/Throat:     Pharynx: Posterior oropharyngeal erythema present.  Eyes:     General:        Right eye: No discharge.          Left eye: No discharge.     Conjunctiva/sclera: Conjunctivae normal.     Pupils: Pupils are equal, round, and reactive to light.  Neck:     Musculoskeletal: Normal range of motion and neck supple.  Cardiovascular:     Rate and Rhythm: Normal rate and regular rhythm.     Heart  sounds: Normal heart sounds.  Pulmonary:     Effort: Pulmonary effort is normal. No respiratory distress.     Breath sounds: Wheezing present. No decreased breath sounds, rhonchi or rales.  Abdominal:     General: Bowel sounds are normal.     Palpations: Abdomen is soft.  Musculoskeletal: Normal range of motion.  Skin:    General: Skin is warm and dry.         Assessment & Plan:   1. Bronchitis, acute, with bronchospasm - doxycycline (VIBRA-TABS) 100 MG tablet; Take 1 tablet (100 mg total) by mouth 2 (two) times daily. 1 po bid  Dispense: 20 tablet; Refill: 0 - predniSONE (STERAPRED UNI-PAK 21 TAB) 10 MG (21) TBPK tablet; As directed x 6 days  Dispense: 21 tablet; Refill: 0   Continue all other maintenance medications as listed above.  Follow up plan: Return for keep regular follow up.  Educational handout given for [place] PA-C [place] [address]  [address] [place], [place] [address] [phone]   08/16/2018, 9:05 AM

## 2018-08-17 ENCOUNTER — Telehealth: Payer: Self-pay | Admitting: *Deleted

## 2018-08-17 IMAGING — DX DG FOOT COMPLETE 3+V*R*
3 series · 3 of 3 positions shown · non-contrast
Comparison: None.

CLINICAL DATA: Right heel pain.  No known injury.

EXAM:
RIGHT FOOT COMPLETE - 3+ VIEW

[foot ap]
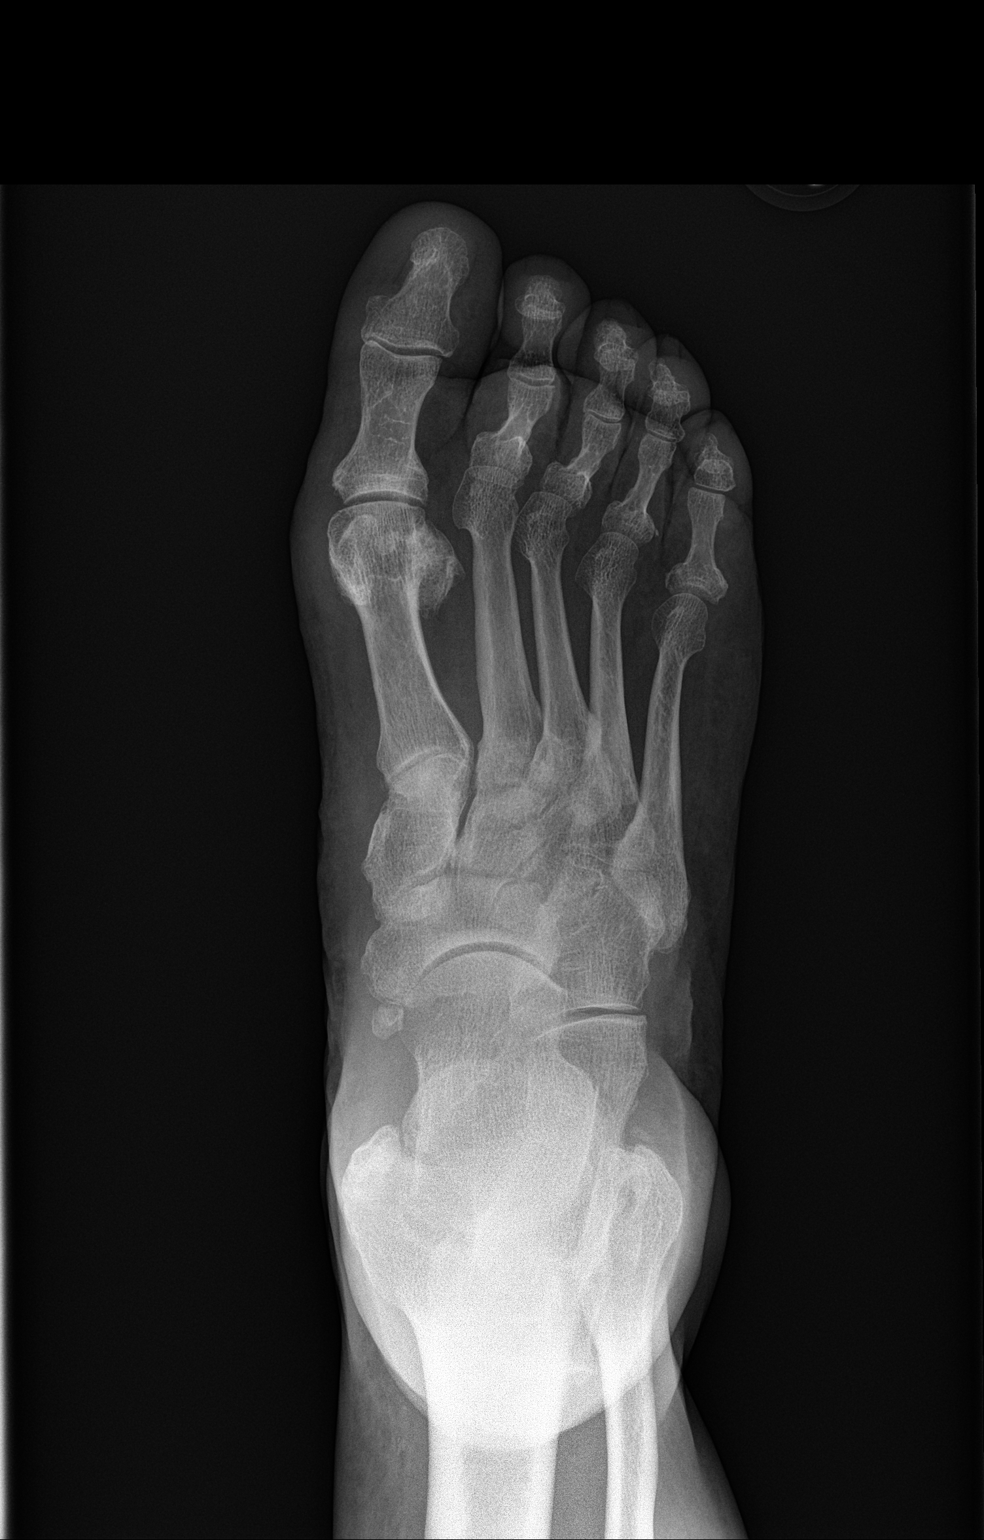

[foot obl]
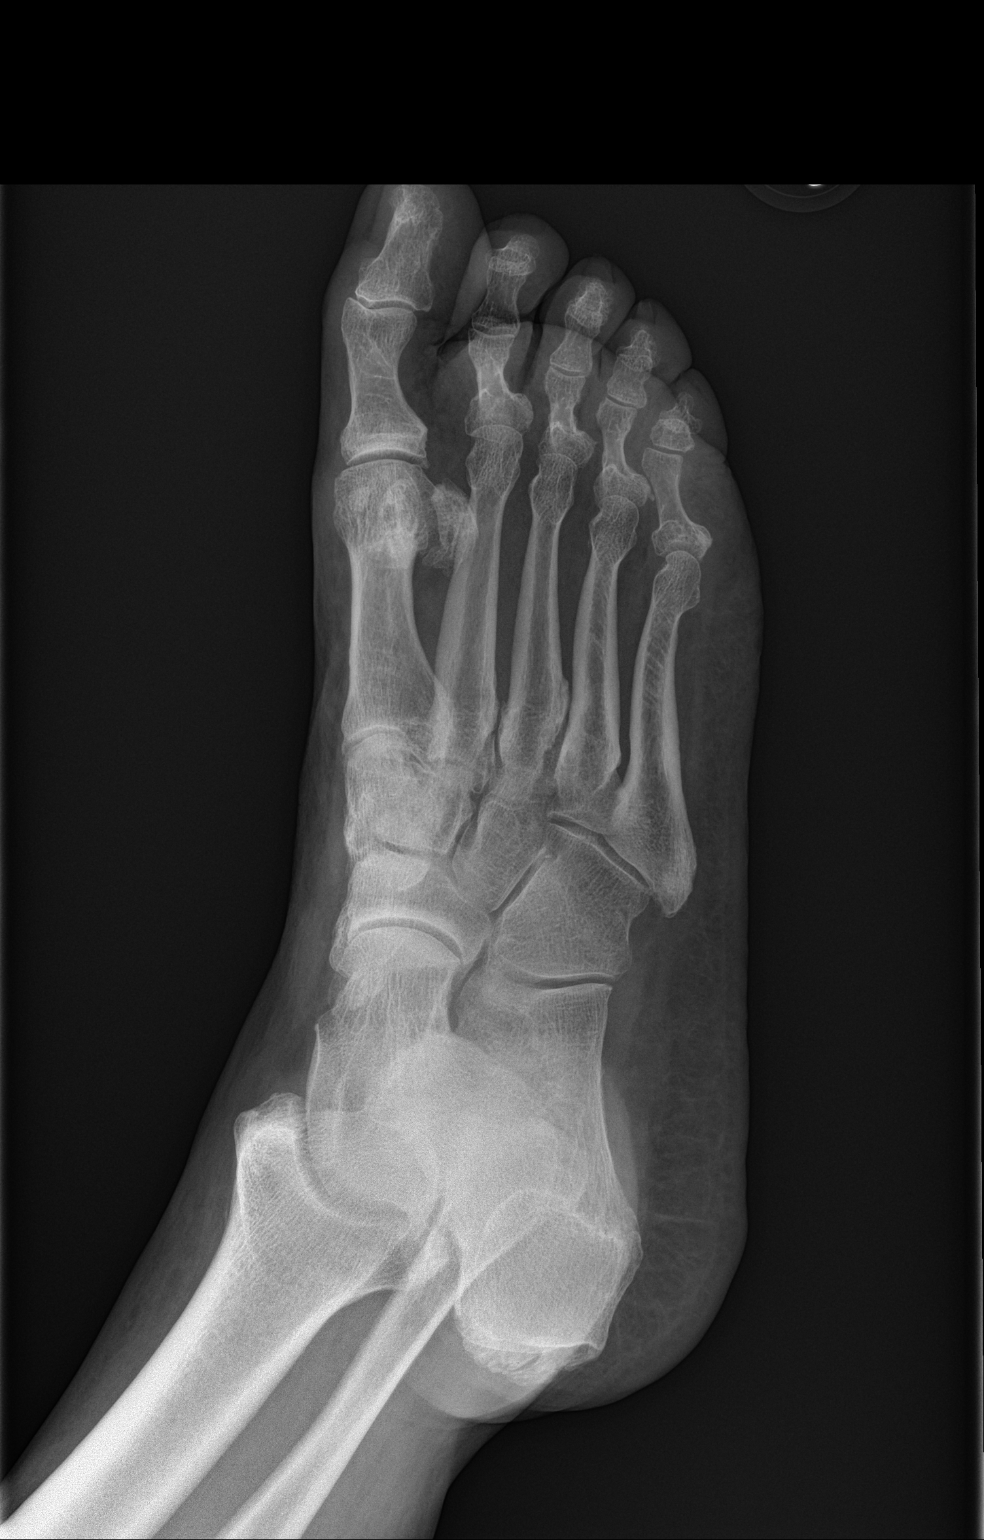

[foot lat]
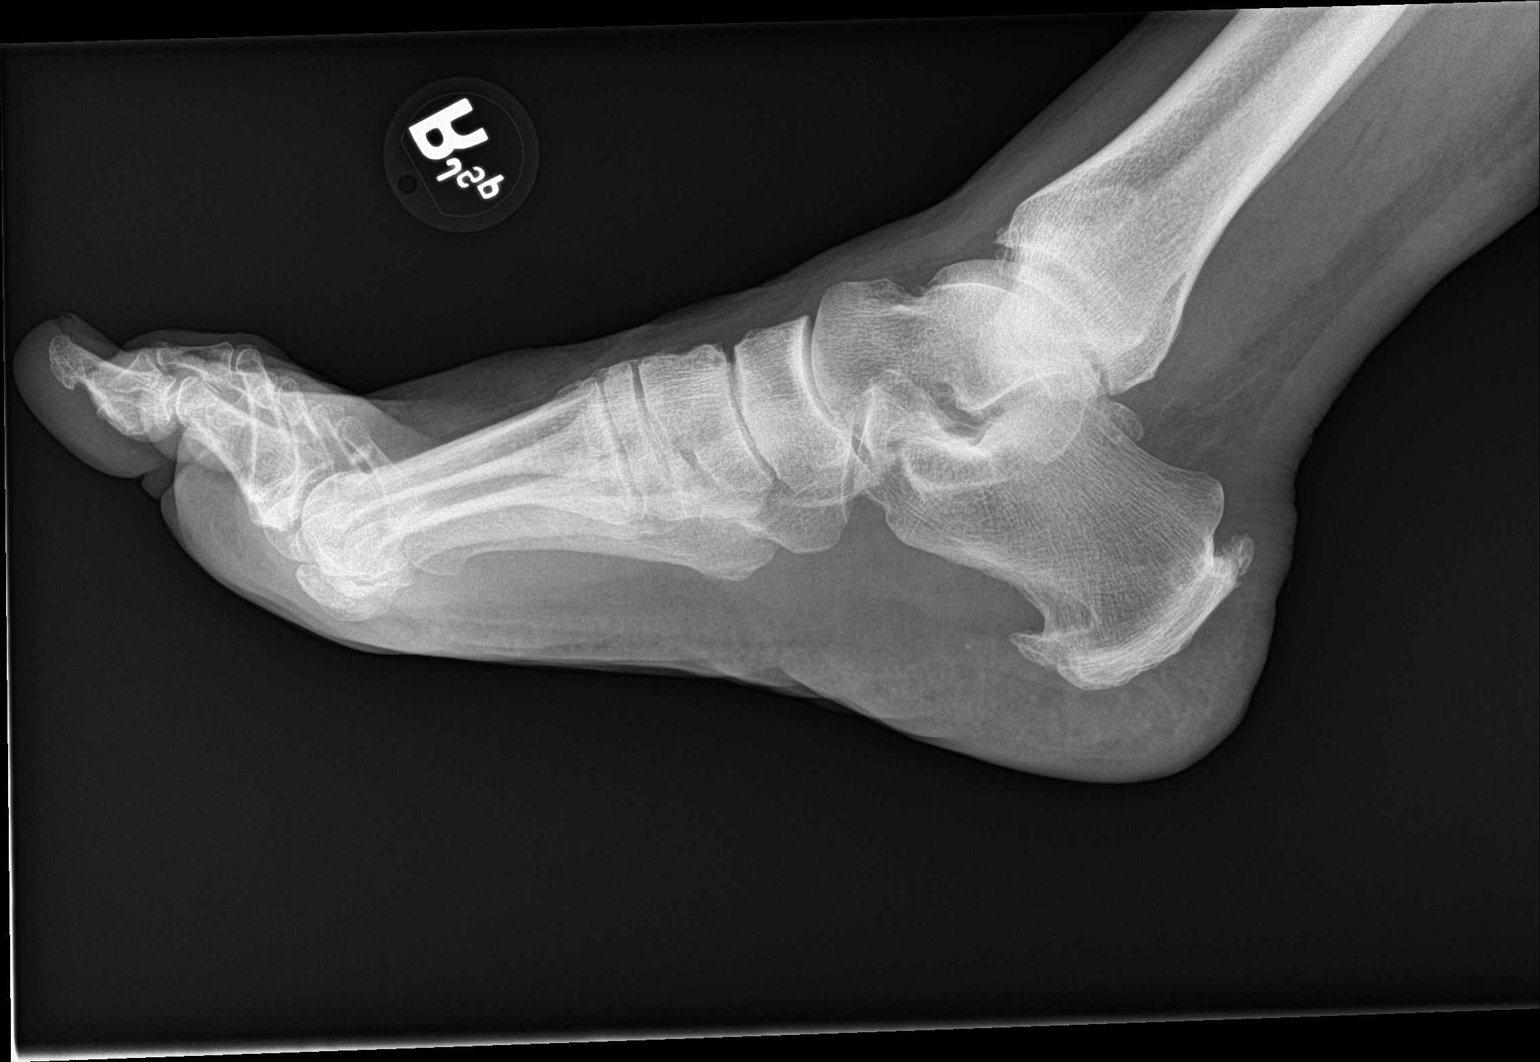

[3 of 3 positions shown; findings below may reference images not displayed]

FINDINGS: No acute bony or joint abnormality is identified. Prominent dorsal
and plantar calcaneal spurs are seen. Small accessory ossicle of the
navicular is noted. No evidence of arthropathy. No focal bony
lesion. Soft tissues are unremarkable.
IMPRESSION: Prominent dorsal and plantar calcaneal spurs.

No acute finding.

## 2018-08-17 NOTE — Telephone Encounter (Signed)
Either way is fine, it is the same med

## 2018-08-17 NOTE — Telephone Encounter (Signed)
VM from [place] w/ [facility] for HCTZ 12.5 mg was sent to as capsules, he has had tablets in the past Please advise if this is an intended change Message maybe left on voicemail

## 2018-08-18 NOTE — Telephone Encounter (Signed)
LMOVM of providers response

## 2018-08-24 ENCOUNTER — Telehealth: Payer: Self-pay | Admitting: Physician Assistant

## 2018-08-24 DIAGNOSIS — E785 Hyperlipidemia, unspecified: Secondary | ICD-10-CM

## 2018-08-24 MED ORDER — EZETIMIBE 10 MG PO TABS: 10.0000 mg | ORAL_TABLET | Freq: Every day | ORAL | 0 refills | Status: DC

## 2018-08-24 MED ORDER — HYDROCHLOROTHIAZIDE 12.5 MG PO CAPS: 12.5000 mg | ORAL_CAPSULE | Freq: Every day | ORAL | 0 refills | Status: DC

## 2018-08-24 MED ORDER — SIMVASTATIN 40 MG PO TABS: 40.0000 mg | ORAL_TABLET | Freq: Every day | ORAL | 0 refills | Status: DC

## 2018-08-24 NOTE — Telephone Encounter (Signed)
Pt's mail order will not be here for another week, he is running out of these medications tomorrow. Pt aware sent to [facility]

## 2018-08-30 DIAGNOSIS — R351 Nocturia: Secondary | ICD-10-CM | POA: Diagnosis not present

## 2018-08-30 DIAGNOSIS — N401 Enlarged prostate with lower urinary tract symptoms: Secondary | ICD-10-CM | POA: Diagnosis not present

## 2018-08-30 DIAGNOSIS — R350 Frequency of micturition: Secondary | ICD-10-CM | POA: Diagnosis not present

## 2018-08-30 DIAGNOSIS — N3941 Urge incontinence: Secondary | ICD-10-CM | POA: Diagnosis not present

## 2018-09-02 ENCOUNTER — Encounter: Payer: PPO | Admitting: *Deleted

## 2018-09-15 DIAGNOSIS — N3941 Urge incontinence: Secondary | ICD-10-CM | POA: Diagnosis not present

## 2018-09-15 DIAGNOSIS — R351 Nocturia: Secondary | ICD-10-CM | POA: Diagnosis not present

## 2018-09-15 DIAGNOSIS — N401 Enlarged prostate with lower urinary tract symptoms: Secondary | ICD-10-CM | POA: Diagnosis not present

## 2018-09-16 DIAGNOSIS — L821 Other seborrheic keratosis: Secondary | ICD-10-CM | POA: Diagnosis not present

## 2018-09-16 DIAGNOSIS — L812 Freckles: Secondary | ICD-10-CM | POA: Diagnosis not present

## 2018-09-16 DIAGNOSIS — L570 Actinic keratosis: Secondary | ICD-10-CM | POA: Diagnosis not present

## 2018-09-16 DIAGNOSIS — Z806 Family history of leukemia: Secondary | ICD-10-CM | POA: Diagnosis not present

## 2018-09-16 DIAGNOSIS — C61 Malignant neoplasm of prostate: Secondary | ICD-10-CM | POA: Diagnosis not present

## 2018-09-16 DIAGNOSIS — F17210 Nicotine dependence, cigarettes, uncomplicated: Secondary | ICD-10-CM | POA: Diagnosis not present

## 2018-09-16 DIAGNOSIS — D1801 Hemangioma of skin and subcutaneous tissue: Secondary | ICD-10-CM | POA: Diagnosis not present

## 2018-09-16 DIAGNOSIS — L218 Other seborrheic dermatitis: Secondary | ICD-10-CM | POA: Diagnosis not present

## 2018-09-16 DIAGNOSIS — I788 Other diseases of capillaries: Secondary | ICD-10-CM | POA: Diagnosis not present

## 2018-09-16 DIAGNOSIS — Z794 Long term (current) use of insulin: Secondary | ICD-10-CM | POA: Diagnosis not present

## 2018-09-16 DIAGNOSIS — M1611 Unilateral primary osteoarthritis, right hip: Secondary | ICD-10-CM | POA: Diagnosis not present

## 2018-09-16 DIAGNOSIS — Z79899 Other long term (current) drug therapy: Secondary | ICD-10-CM | POA: Diagnosis not present

## 2018-09-16 DIAGNOSIS — E119 Type 2 diabetes mellitus without complications: Secondary | ICD-10-CM | POA: Diagnosis not present

## 2018-09-16 DIAGNOSIS — Z85828 Personal history of other malignant neoplasm of skin: Secondary | ICD-10-CM | POA: Diagnosis not present

## 2018-10-05 ENCOUNTER — Telehealth: Payer: Self-pay | Admitting: Physician Assistant

## 2018-10-15 ENCOUNTER — Other Ambulatory Visit: Payer: Self-pay

## 2018-10-15 ENCOUNTER — Ambulatory Visit (INDEPENDENT_AMBULATORY_CARE_PROVIDER_SITE_OTHER): Payer: PPO

## 2018-10-15 DIAGNOSIS — Z0000 Encounter for general adult medical examination without abnormal findings: Secondary | ICD-10-CM | POA: Diagnosis not present

## 2018-10-15 NOTE — Patient Instructions (Signed)
  Lonnie Hurley , Thank you for taking time to come for your Medicare Wellness Visit. I appreciate your ongoing commitment to your health goals. Please review the following plan we discussed and let me know if I can assist you in the future.   These are the goals we discussed: Goals    . Exercise 150 min/wk Moderate Activity    . Have 3 meals a day    . Weight (lb) < 225 lb (102.1 kg)     Recommend goal is weight loss of 10% of weight as initial goal.        This is a list of the screening recommended for you and due dates:  Health Maintenance  Topic Date Due  . Flu Shot  01/15/2019  . Colon Cancer Screening  03/01/2019  . Tetanus Vaccine  01/11/2023  . Pneumonia vaccines  Completed

## 2018-10-15 NOTE — Progress Notes (Addendum)
MEDICARE ANNUAL WELLNESS VISIT  10/15/2018  Telephone Visit Disclaimer This Medicare AWV was conducted by telephone due to national recommendations for restrictions regarding the COVID-19 Pandemic (e.g. social distancing).  I verified, using two identifiers, that I am speaking with Lonnie Hurley or their authorized healthcare agent. I discussed the limitations, risks, security, and privacy concerns of performing an evaluation and management service by telephone and the potential availability of an in-person appointment in the future. The patient expressed understanding and agreed to proceed.   Subjective:  Lonnie Hurley is a 78 y.o. male patient of Terald Sleeper, PA-C who had a Medicare Annual Wellness Visit today via telephone. [name] is Retired and lives with his adult brother. he has 0 children. he reports that he is socially active and does interact with friends/family regularly. he is minimally physically active..  Patient Care Team: Theodoro Clock as PCP - General (Physician Assistant) Nobie Putnam, MD (Hematology and Oncology) Irine Seal, MD as Attending Physician (Urology) Williford, Layne Benton, MD as Consulting Physician (Dermatology) Warden Fillers, MD as Consulting Physician (Ophthalmology) Erline Levine, MD as Consulting Physician (Neurosurgery)  Advanced Directives 10/15/2018 04/30/2018 02/19/2017 08/26/2016 08/25/2016 10/25/2015 02/14/2014  Does Patient Have a Medical Advance Directive? No No Yes Yes No No Yes  Type of Advance Directive - - Public librarian;Living will - - Living will;Healthcare Power of Attorney  Does patient want to make changes to medical advance directive? - - - No - Patient declined - - No - Patient declined  Copy of [place] in Chart? - - No - copy requested No - copy requested - - -  Would patient like information on creating a medical advance directive? No - Patient declined No - Patient declined - No - Patient  declined Yes (MAU/Ambulatory/Procedural Areas - Information given) Yes - Educational materials given -   Patient states that he started that paper work for an advanced directive but they have to finish it up and make some changes.  Hospital Utilization Over the Past 12 Months: # of hospitalizations or ER visits: 0 # of surgeries: 0  Review of Systems    Patient reports that his overall health is unchanged compared to last year.  Patient Reported Readings (BP, Pulse, CBG, Weight, etc) none  Review of Systems: Chart review  All other systems negative.  Pain Assessment Pain : No/denies pain     Current Medications & Allergies (verified) Allergies as of 10/15/2018      Reactions   Penicillins Other (See Comments)   unknown   Aspirin Other (See Comments)   Causes Blood in urine   Crestor [rosuvastatin Calcium] Other (See Comments)   Myalgia, "joint problem"   Gabapentin Other (See Comments)   Blurry vision   Hydrocodone Other (See Comments)   Constipation    Lyrica [pregabalin]    Blurry vision      Medication List       Accurate as of Oct 15, 2018  2:30 PM. Always use your most recent med list.        acetaminophen 500 MG tablet Commonly known as:  TYLENOL Take 500 mg by mouth every 6 (six) hours as needed.   CALCIUM 600 + D PO Take by mouth 2 (two) times daily.   ezetimibe 10 MG tablet Commonly known as:  ZETIA Take 1 tablet (10 mg total) by mouth daily.   Fish Oil 1000 MG Caps Take 1 capsule by mouth daily.  1200   Flomax 0.4 MG Caps capsule Generic drug:  tamsulosin Take 0.4 mg by mouth at bedtime.   GLUCOSAMINE 1500 COMPLEX PO Take by mouth 2 (two) times daily.   glucose blood test strip Commonly known as:  ONE TOUCH ULTRA TEST Use as instructed   hydrochlorothiazide 12.5 MG capsule Commonly known as:  MICROZIDE Take 1 capsule (12.5 mg total) by mouth daily.   meloxicam 7.5 MG tablet Commonly known as:  MOBIC Take 1 tablet (7.5 mg total) by  mouth daily.   METAMUCIL FIBER PO Take by mouth.   multivitamin with minerals tablet Take 1 tablet by mouth daily.   nystatin-triamcinolone cream Commonly known as:  MYCOLOG II Apply 1 application topically 2 (two) times daily.   polyethylene glycol powder 17 GM/SCOOP powder Commonly known as:  MiraLax Take 255 g by mouth as needed.   ramipril 10 MG capsule Commonly known as:  ALTACE Take 1 capsule (10 mg total) by mouth daily.   simvastatin 40 MG tablet Commonly known as:  ZOCOR Take 1 tablet (40 mg total) by mouth at bedtime.   Vitamin D3 50 MCG (2000 UT) Tabs Take 1 tablet by mouth daily.       History (reviewed): Past Medical History:  Diagnosis Date  . BPH (benign prostatic hypertrophy)    Dr. Rosana Hoes / Dr. Joelyn Oms  - Urologist   . Cancer [facility])    lip-mole surgery  . Cataract   . Colon polyps   . DDD (degenerative disc disease)   . Hearing loss   . Hypercholesteremia   . Hyperlipidemia   . Hypertension   . Leukoplakia   . Microscopic hematuria    negative work up with Urology in the past.   . OAB (overactive bladder)    with past percutaneous tibial nerve stimulation therapy  . Thrombocytopenia ([place])   . Tinnitus   . Tobacco abuse   . Vitamin B 12 deficiency   . Vitamin D deficiency    Past Surgical History:  Procedure Laterality Date  . CATARACT EXTRACTION  2007   right Hurley  . CATARACT EXTRACTION Left 03/21/15  . COLONOSCOPY    . HERNIA REPAIR  1980  . KNEE SURGERY  02-28-2008   replaced inside right knee  . Left Knee Repair  11-10-2006  . NOSE SURGERY    . POLYPECTOMY    . SKIN BIOPSY  10-08-2006   left ear   Family History  Problem Relation Age of Onset  . Heart failure Father   . Glaucoma Mother   . Osteoporosis Brother   . Heart disease Brother   . Hypertension Brother   . Hyperlipidemia Brother   . Diabetes Brother   . Colon cancer Neg Hx   . Stomach cancer Neg Hx    Social History   Socioeconomic History  . Marital status:  Single    Spouse name: Not on file  . Number of children: 0  . Years of education: Not on file  . Highest education level: Not on file  Occupational History    Comment: retired Immunologist  Social Needs  . Financial resource strain: Not hard at all  . Food insecurity:    Worry: Never true    Inability: Never true  . Transportation needs:    Medical: No    Non-medical: No  Tobacco Use  . Smoking status: Former Smoker    Packs/day: 1.00    Years: 50.00    Pack years: 50.00  Types: Cigarettes    Last attempt to quit: 08/14/2009    Years since quitting: 9.1  . Smokeless tobacco: Never Used  Substance and Sexual Activity  . Alcohol use: No  . Drug use: No  . Sexual activity: Never  Lifestyle  . Physical activity:    Days per week: 5 days    Minutes per session: 30 min  . Stress: Not at all  Relationships  . Social connections:    Talks on phone: More than three times a week    Gets together: More than three times a week    Attends religious service: More than 4 times per year    Active member of club or organization: No    Attends meetings of clubs or organizations: Never    Relationship status: Never married  Other Topics Concern  . Not on file  Social History Narrative  . Not on file    Activities of Daily Living In your present state of health, do you have any difficulty performing the following activities: 10/15/2018  Hearing? N  Vision? N  Difficulty concentrating or making decisions? N  Walking or climbing stairs? N  Dressing or bathing? N  Doing errands, shopping? N  Preparing Food and eating ? N  Using the Toilet? N  In the past six months, have you accidently leaked urine? N  Do you have problems with loss of bowel control? N  Managing your Medications? N  Managing your Finances? N  Housekeeping or managing your Housekeeping? N  Some recent data might be hidden    Patient Literacy How often do you need to have someone help you when  you read instructions, pamphlets, or other written materials from your doctor or pharmacy?: 2 - Rarely  Exercise Current Exercise Habits: The patient does not participate in regular exercise at present, Exercise limited by: cardiac condition(s);respiratory conditions(s)  Diet Patient reports consuming 3 meals a day and 2 snack(s) a day Patient reports that his primary diet is: Regular Patient reports that she does have regular access to food.   Depression Screen PHQ 2/9 Scores 08/16/2018 07/20/2018 06/29/2018 06/29/2018 04/30/2018 04/14/2018 03/19/2018  PHQ - 2 Score 0 0 0 0 0 2 2  PHQ- 9 Score - - - - - 4 4     Fall Risk Fall Risk  08/16/2018 07/20/2018 06/29/2018 06/29/2018 04/14/2018  Falls in the past year? 0 0 0 0 No     Objective:  Nitish Roes seemed alert and oriented and he participated appropriately during our telephone visit.  Blood Pressure Weight BMI  BP Readings from Last 3 Encounters:  08/16/18 119/70  07/20/18 113/66  06/29/18 114/69   Wt Readings from Last 3 Encounters:  08/16/18 246 lb 3.2 oz (111.7 kg)  07/20/18 248 lb 9.6 oz (112.8 kg)  06/29/18 250 lb (113.4 kg)   BMI Readings from Last 1 Encounters:  08/16/18 39.74 kg/m    *Unable to obtain current vital signs, weight, and BMI due to telephone visit type  Hearing/Vision  . Brewer did not seem to have difficulty with hearing/understanding during the telephone conversation . Reports that he has had a formal Hurley exam by an Hurley care professional within the past year . Reports that he has not had a formal hearing evaluation within the past year *Unable to fully assess hearing and vision during telephone visit type  Cognitive Function: 6CIT Screen 10/15/2018  What Year? 0 points  What month? 0 points  What time? 0 points  Count back from 20 0 points  Months in reverse 0 points  Repeat phrase 0 points  Total Score 0    Normal Cognitive Function Screening: Yes (Normal:0-7, Significant for Dysfunction: >8)   Immunization & Health Maintenance Record Immunization History  Administered Date(s) Administered  . Influenza, High Dose Seasonal PF 03/13/2016, 03/17/2017, 03/15/2018  . Influenza,inj,Quad PF,6+ Mos 03/14/2014, 03/16/2015  . Pneumococcal Conjugate-13 12/29/2013  . Pneumococcal Polysaccharide-23 03/16/2009  . Rabies, IM 01/10/2013, 01/17/2013, 01/24/2013, 01/31/2013  . Tdap 01/10/2013  . Zoster 03/17/2011  . Zoster Recombinat (Shingrix) 09/01/2017, 11/12/2017    Health Maintenance  Topic Date Due  . INFLUENZA VACCINE  01/15/2019  . COLONOSCOPY  03/01/2019  . TETANUS/TDAP  01/11/2023  . PNA vac Low Risk Adult  Completed       Assessment  This is a routine wellness examination for [employer].  Health Maintenance: Due or Overdue There are no preventive care reminders to display for this patient.  Levie Wages does not need a referral for Community Assistance: Care Management:   no Social Work:    no Prescription Assistance:  no Nutrition/Diabetes Education:  no   Plan:  Personalized Goals Goals Addressed            This Visit's Progress   . Exercise 150 min/wk Moderate Activity      . Have 3 meals a day        Personalized Health Maintenance & Screening Recommendations    Lung Cancer Screening Recommended: yes  Patient has this done yearly (Low Dose CT Chest recommended if Age 51-80 years, 30 pack-year currently smoking OR have quit w/in past 15 years) Hepatitis C Screening recommended: not applicable HIV Screening recommended: no  Advanced Directives: Written information was not prepared per patient's request.  Referrals & Orders No orders of the defined types were placed in this encounter.   Follow-up Plan . Follow-up with Terald Sleeper, PA-C as planned . Scheduled with Particia Nearing for regular follow up in July .    I have personally reviewed and noted the following in the patient's chart:   . Medical and social history . Use of alcohol,  tobacco or illicit drugs  . Current medications and supplements . Functional ability and status . Nutritional status . Physical activity . Advanced directives . List of other physicians . Hospitalizations, surgeries, and ER visits in previous 12 months . Vitals . Screenings to include cognitive, depression, and falls . Referrals and appointments  In addition, I have reviewed and discussed with Lonnie Hurley certain preventive protocols, quality metrics, and best practice recommendations. A written personalized care plan for preventive services as well as general preventive health recommendations is available and can be mailed to the patient at his request.      Theodoro Clock LPN 02/16/2670

## 2018-11-18 DIAGNOSIS — R351 Nocturia: Secondary | ICD-10-CM | POA: Diagnosis not present

## 2018-11-18 DIAGNOSIS — R3121 Asymptomatic microscopic hematuria: Secondary | ICD-10-CM | POA: Diagnosis not present

## 2018-11-18 DIAGNOSIS — N401 Enlarged prostate with lower urinary tract symptoms: Secondary | ICD-10-CM | POA: Diagnosis not present

## 2018-11-18 DIAGNOSIS — N3941 Urge incontinence: Secondary | ICD-10-CM | POA: Diagnosis not present

## 2018-11-29 ENCOUNTER — Telehealth: Payer: Self-pay | Admitting: Physician Assistant

## 2018-11-29 DIAGNOSIS — E785 Hyperlipidemia, unspecified: Secondary | ICD-10-CM

## 2018-11-29 MED ORDER — HYDROCHLOROTHIAZIDE 12.5 MG PO CAPS: 12.5000 mg | ORAL_CAPSULE | Freq: Every day | ORAL | 1 refills | Status: DC

## 2018-11-29 MED ORDER — SIMVASTATIN 40 MG PO TABS: 40.0000 mg | ORAL_TABLET | Freq: Every day | ORAL | 1 refills | Status: DC

## 2018-11-29 MED ORDER — EZETIMIBE 10 MG PO TABS: 10.0000 mg | ORAL_TABLET | Freq: Every day | ORAL | 1 refills | Status: DC

## 2018-11-29 MED ORDER — TAMSULOSIN HCL 0.4 MG PO CAPS: 0.4000 mg | ORAL_CAPSULE | Freq: Every day | ORAL | 1 refills | Status: DC

## 2018-11-29 NOTE — Telephone Encounter (Signed)
What is the name of the medication? Tamsulosin HCl (FLOMAX) 0.4 MG CAPS simvastatin (ZOCOR) 40 MG tablet hydrochlorothiazide (MICROZIDE) 12.5 MG capsule ezetimibe (ZETIA) 10 MG tablet   Have you contacted your pharmacy to request a refill? Yes has not received from mail order [facility]. He has been out for 2 days.  Which pharmacy would you like this sent to? Use local [facility] in [place]    Patient notified that their request is being sent to the clinical staff for review and that they should receive a call once it is complete. If they do not receive a call within 24 hours they can check with their pharmacy or our office.

## 2018-11-29 NOTE — Telephone Encounter (Signed)
[facility]ills [facility] = NA to let pt know

## 2018-12-09 ENCOUNTER — Encounter: Payer: Self-pay | Admitting: *Deleted

## 2018-12-21 ENCOUNTER — Other Ambulatory Visit: Payer: Self-pay | Admitting: Physician Assistant

## 2018-12-21 NOTE — Telephone Encounter (Signed)
rx approved, pt has follow up appt with AJ 12/28/18.

## 2018-12-27 ENCOUNTER — Other Ambulatory Visit: Payer: Self-pay

## 2018-12-28 ENCOUNTER — Encounter: Payer: Self-pay | Admitting: Physician Assistant

## 2018-12-28 ENCOUNTER — Ambulatory Visit (INDEPENDENT_AMBULATORY_CARE_PROVIDER_SITE_OTHER): Payer: PPO | Admitting: Physician Assistant

## 2018-12-28 VITALS — BP 120/72 | HR 78 | Temp 98.6°F | Ht 66.0 in | Wt 244.4 lb

## 2018-12-28 DIAGNOSIS — A770 Spotted fever due to Rickettsia rickettsii: Secondary | ICD-10-CM

## 2018-12-28 DIAGNOSIS — H9193 Unspecified hearing loss, bilateral: Secondary | ICD-10-CM

## 2018-12-28 DIAGNOSIS — I10 Essential (primary) hypertension: Secondary | ICD-10-CM

## 2018-12-28 DIAGNOSIS — E785 Hyperlipidemia, unspecified: Secondary | ICD-10-CM | POA: Diagnosis not present

## 2018-12-28 MED ORDER — RAMIPRIL 10 MG PO CAPS: 10.0000 mg | ORAL_CAPSULE | Freq: Every day | ORAL | 3 refills | Status: DC

## 2018-12-28 MED ORDER — SIMVASTATIN 40 MG PO TABS: 40.0000 mg | ORAL_TABLET | Freq: Every day | ORAL | 3 refills | Status: DC

## 2018-12-28 MED ORDER — TAMSULOSIN HCL 0.4 MG PO CAPS: 0.4000 mg | ORAL_CAPSULE | Freq: Every day | ORAL | 3 refills | Status: DC

## 2018-12-28 MED ORDER — HYDROCHLOROTHIAZIDE 12.5 MG PO CAPS: 12.5000 mg | ORAL_CAPSULE | Freq: Every day | ORAL | 3 refills | Status: DC

## 2018-12-28 MED ORDER — EZETIMIBE 10 MG PO TABS: 10.0000 mg | ORAL_TABLET | Freq: Every day | ORAL | 3 refills | Status: DC

## 2018-12-29 DIAGNOSIS — M48062 Spinal stenosis, lumbar region with neurogenic claudication: Secondary | ICD-10-CM | POA: Diagnosis not present

## 2018-12-29 DIAGNOSIS — M48061 Spinal stenosis, lumbar region without neurogenic claudication: Secondary | ICD-10-CM | POA: Insufficient documentation

## 2018-12-30 LAB — ROCKY MTN SPOTTED FVR ABS PNL(IGG+IGM)
RMSF IgG: POSITIVE — AB
RMSF IgM: 0.33 index (ref 0.00–0.89)

## 2018-12-30 LAB — RMSF, IGG, IFA: RMSF, IGG, IFA: 1:64 {titer} — ABNORMAL HIGH

## 2018-12-30 NOTE — Progress Notes (Signed)
BP 120/72   Pulse 78   Temp 98.6 F (37 C) (Oral)   Ht 5' 6"  (1.676 m)   Wt 244 lb 6.4 oz (110.9 kg)   BMI 39.45 kg/m    Subjective:    Patient ID: Lonnie Hurley, male    DOB: May 20, 1941, 78 y.o.   MRN: [id number]  HPI: Lonnie Hurley is a 78 y.o. male presenting on 12/28/2018 for Medical Management of Chronic Issues (6 month )  He comes in today for recheck on chronic medical conditions.  He has continued fatigue.  He would also like to have the [facility] spotted fever test we ran before did show an ongoing infection.  And I would like to have it rechecked again.  He does have a lot of fatigue after standing and outside.  He will be having a spinal injection next week by his back specialist.  He reports that he has been having decreased hearing and it has gotten worse.  We will plan to have audiology and otolaryngology referral placed.  We also need to do lab works in the next few weeks.  He does need refills on some of his medications.  He does not have any other significant issues going on.  Past Medical History:  Diagnosis Date  . BPH (benign prostatic hypertrophy)    Dr. Rosana Hoes / Dr. Joelyn Oms  - Urologist   . Cancer [facility])    lip-mole surgery  . Cataract   . Colon polyps   . DDD (degenerative disc disease)   . Hearing loss   . Hypercholesteremia   . Hyperlipidemia   . Hypertension   . Leukoplakia   . Microscopic hematuria    negative work up with Urology in the past.   . OAB (overactive bladder)    with past percutaneous tibial nerve stimulation therapy  . Thrombocytopenia ([place])   . Tinnitus   . Tobacco abuse   . Vitamin B 12 deficiency   . Vitamin D deficiency    Relevant past medical, surgical, family and social history reviewed and updated as indicated. Interim medical history since our last visit reviewed. Allergies and medications reviewed and updated. DATA REVIEWED: CHART IN EPIC  Family History reviewed for pertinent findings.  Review of Systems   Constitutional: Positive for fatigue. Negative for appetite change and fever.  HENT: Negative.   Eyes: Negative.  Negative for pain and visual disturbance.  Respiratory: Negative.  Negative for cough, chest tightness, shortness of breath and wheezing.   Cardiovascular: Negative.  Negative for chest pain, palpitations and leg swelling.  Gastrointestinal: Negative.  Negative for abdominal pain, diarrhea, nausea and vomiting.  Endocrine: Negative.   Genitourinary: Negative.   Musculoskeletal: Positive for arthralgias, back pain, joint swelling and myalgias.  Skin: Negative.  Negative for color change and rash.  Neurological: Positive for weakness. Negative for numbness and headaches.  Psychiatric/Behavioral: Negative.     Allergies as of 12/28/2018      Reactions   Penicillins Other (See Comments)   unknown   Aspirin Other (See Comments)   Causes Blood in urine   Crestor [rosuvastatin Calcium] Other (See Comments)   Myalgia, "joint problem"   Gabapentin Other (See Comments)   Blurry vision   Hydrocodone Other (See Comments)   Constipation    Lyrica [pregabalin]    Blurry vision      Medication List       Accurate as of December 28, 2018 11:59 PM. If you have any questions, ask your nurse  or doctor.        acetaminophen 500 MG tablet Commonly known as: TYLENOL Take 500 mg by mouth every 6 (six) hours as needed.   CALCIUM 600 + D PO Take by mouth 2 (two) times daily.   ezetimibe 10 MG tablet Commonly known as: ZETIA Take 1 tablet (10 mg total) by mouth daily.   Fish Oil 1000 MG Caps Take 1 capsule by mouth daily. 1200   GLUCOSAMINE 1500 COMPLEX PO Take by mouth 2 (two) times daily.   glucose blood test strip Commonly known as: ONE TOUCH ULTRA TEST Use as instructed   hydrochlorothiazide 12.5 MG capsule Commonly known as: MICROZIDE Take 1 capsule (12.5 mg total) by mouth daily.   meloxicam 7.5 MG tablet Commonly known as: MOBIC TAKE 1 TABLET BY MOUTH EVERY DAY    METAMUCIL FIBER PO Take by mouth.   multivitamin with minerals tablet Take 1 tablet by mouth daily.   nystatin-triamcinolone cream Commonly known as: MYCOLOG II Apply 1 application topically 2 (two) times daily.   oxybutynin 10 MG 24 hr tablet Commonly known as: DITROPAN-XL Take 10 mg by mouth daily.   polyethylene glycol powder 17 GM/SCOOP powder Commonly known as: MiraLax Take 255 g by mouth as needed.   ramipril 10 MG capsule Commonly known as: ALTACE Take 1 capsule (10 mg total) by mouth daily.   simvastatin 40 MG tablet Commonly known as: ZOCOR Take 1 tablet (40 mg total) by mouth at bedtime.   tamsulosin 0.4 MG Caps capsule Commonly known as: Flomax Take 1 capsule (0.4 mg total) by mouth at bedtime.   Vitamin D3 50 MCG (2000 UT) Tabs Take 1 tablet by mouth daily.          Objective:    BP 120/72   Pulse 78   Temp 98.6 F (37 C) (Oral)   Ht 5' 6"  (1.676 m)   Wt 244 lb 6.4 oz (110.9 kg)   BMI 39.45 kg/m   Allergies  Allergen Reactions  . Penicillins Other (See Comments)    unknown  . Aspirin Other (See Comments)    Causes Blood in urine  . Crestor [Rosuvastatin Calcium] Other (See Comments)    Myalgia, "joint problem"  . Gabapentin Other (See Comments)    Blurry vision   . Hydrocodone Other (See Comments)    Constipation    . Lyrica [Pregabalin]     Blurry vision     Wt Readings from Last 3 Encounters:  12/28/18 244 lb 6.4 oz (110.9 kg)  08/16/18 246 lb 3.2 oz (111.7 kg)  07/20/18 248 lb 9.6 oz (112.8 kg)    Physical Exam Vitals signs and nursing note reviewed.  Constitutional:      General: He is not in acute distress.    Appearance: He is well-developed.  HENT:     Head: Normocephalic and atraumatic.  Eyes:     Conjunctiva/sclera: Conjunctivae normal.     Pupils: Pupils are equal, round, and reactive to light.  Cardiovascular:     Rate and Rhythm: Normal rate and regular rhythm.     Heart sounds: Normal heart sounds.   Pulmonary:     Effort: Pulmonary effort is normal. No respiratory distress.     Breath sounds: Normal breath sounds.  Skin:    General: Skin is warm and dry.  Psychiatric:        Behavior: Behavior normal.     Results for orders placed or performed in visit on 12/28/18  [facility] mtn  spotted fvr abs pnl(IgG+IgM)  Result Value Ref Range   RMSF IgG Positive (A) Negative   RMSF IgM 0.33 0.00 - 0.89 index  RMSF, IgG, IFA  Result Value Ref Range   RMSF, IGG, IFA 1:64 (H) Neg <1:64      Assessment & Plan:   1. RMSF [facility] spotted fever) - [place] mtn spotted fvr abs pnl(IgG+IgM) - RMSF, IgG, IFA  2. Essential hypertension - hydrochlorothiazide (MICROZIDE) 12.5 MG capsule; Take 1 capsule (12.5 mg total) by mouth daily.  Dispense: 90 capsule; Refill: 3 - ramipril (ALTACE) 10 MG capsule; Take 1 capsule (10 mg total) by mouth daily.  Dispense: 90 capsule; Refill: 3 - CBC with Differential/Platelet; Future - CMP14+EGFR; Future - Lipid panel; Future - TSH; Future - TSH  3. Hyperlipidemia, unspecified hyperlipidemia type - simvastatin (ZOCOR) 40 MG tablet; Take 1 tablet (40 mg total) by mouth at bedtime.  Dispense: 90 tablet; Refill: 3 - ezetimibe (ZETIA) 10 MG tablet; Take 1 tablet (10 mg total) by mouth daily.  Dispense: 90 tablet; Refill: 3 - CBC with Differential/Platelet; Future - CMP14+EGFR; Future - Lipid panel; Future - TSH; Future - TSH   Continue all other maintenance medications as listed above.  Follow up plan: Return in about 3 months (around 03/30/2019) for recheck medications,  30 minutes.  Educational handout given for [place] PA-C [place] [address]  [place], [place] [address] [phone]   12/30/2018, 8:31 PM

## 2019-01-12 ENCOUNTER — Other Ambulatory Visit: Payer: PPO

## 2019-01-12 ENCOUNTER — Other Ambulatory Visit: Payer: Self-pay

## 2019-01-12 DIAGNOSIS — I10 Essential (primary) hypertension: Secondary | ICD-10-CM | POA: Diagnosis not present

## 2019-01-12 DIAGNOSIS — E785 Hyperlipidemia, unspecified: Secondary | ICD-10-CM

## 2019-01-13 LAB — CMP14+EGFR
ALT: 31 IU/L (ref 0–44)
AST: 19 IU/L (ref 0–40)
Albumin/Globulin Ratio: 1.8 (ref 1.2–2.2)
Albumin: 3.8 g/dL (ref 3.7–4.7)
Alkaline Phosphatase: 53 IU/L (ref 39–117)
BUN/Creatinine Ratio: 10 (ref 10–24)
BUN: 19 mg/dL (ref 8–27)
Bilirubin Total: 0.9 mg/dL (ref 0.0–1.2)
CO2: 26 mmol/L (ref 20–29)
Calcium: 9.3 mg/dL (ref 8.6–10.2)
Chloride: 103 mmol/L (ref 96–106)
Creatinine, Ser: 1.87 mg/dL — ABNORMAL HIGH (ref 0.76–1.27)
GFR calc Af Amer: 40 mL/min/{1.73_m2} — ABNORMAL LOW (ref >59)
GFR calc non Af Amer: 34 mL/min/{1.73_m2} — ABNORMAL LOW (ref >59)
Globulin, Total: 2.1 g/dL (ref 1.5–4.5)
Glucose: 101 mg/dL — ABNORMAL HIGH (ref 65–99)
Potassium: 4.1 mmol/L (ref 3.5–5.2)
Sodium: 143 mmol/L (ref 134–144)
Total Protein: 5.9 g/dL — ABNORMAL LOW (ref 6.0–8.5)

## 2019-01-13 LAB — CBC WITH DIFFERENTIAL/PLATELET
Basophils Absolute: 0.1 10*3/uL (ref 0.0–0.2)
Basos: 1 %
EOS (ABSOLUTE): 0.2 10*3/uL (ref 0.0–0.4)
Eos: 2 %
Hematocrit: 47.1 % (ref 37.5–51.0)
Hemoglobin: 15.4 g/dL (ref 13.0–17.7)
Immature Grans (Abs): 0.1 10*3/uL (ref 0.0–0.1)
Immature Granulocytes: 1 %
Lymphocytes Absolute: 3.3 10*3/uL — ABNORMAL HIGH (ref 0.7–3.1)
Lymphs: 26 %
MCH: 28.9 pg (ref 26.6–33.0)
MCHC: 32.7 g/dL (ref 31.5–35.7)
MCV: 89 fL (ref 79–97)
Monocytes Absolute: 1.5 10*3/uL — ABNORMAL HIGH (ref 0.1–0.9)
Monocytes: 12 %
Neutrophils Absolute: 7.5 10*3/uL — ABNORMAL HIGH (ref 1.4–7.0)
Neutrophils: 58 %
Platelets: 151 10*3/uL (ref 150–450)
RBC: 5.32 x10E6/uL (ref 4.14–5.80)
RDW: 12.8 % (ref 11.6–15.4)
WBC: 12.7 10*3/uL — ABNORMAL HIGH (ref 3.4–10.8)

## 2019-01-13 LAB — LIPID PANEL
Chol/HDL Ratio: 2.8 ratio (ref 0.0–5.0)
Cholesterol, Total: 129 mg/dL (ref 100–199)
HDL: 46 mg/dL (ref >39)
LDL Calculated: 54 mg/dL (ref 0–99)
Triglycerides: 144 mg/dL (ref 0–149)
VLDL Cholesterol Cal: 29 mg/dL (ref 5–40)

## 2019-02-01 ENCOUNTER — Ambulatory Visit (INDEPENDENT_AMBULATORY_CARE_PROVIDER_SITE_OTHER): Payer: PPO | Admitting: Nurse Practitioner

## 2019-02-01 ENCOUNTER — Ambulatory Visit (INDEPENDENT_AMBULATORY_CARE_PROVIDER_SITE_OTHER): Payer: PPO

## 2019-02-01 ENCOUNTER — Other Ambulatory Visit: Payer: Self-pay

## 2019-02-01 ENCOUNTER — Encounter: Payer: Self-pay | Admitting: Nurse Practitioner

## 2019-02-01 VITALS — BP 134/74 | HR 56 | Temp 97.1°F | Ht 66.0 in | Wt 238.0 lb

## 2019-02-01 DIAGNOSIS — R079 Chest pain, unspecified: Secondary | ICD-10-CM | POA: Diagnosis not present

## 2019-02-01 DIAGNOSIS — R109 Unspecified abdominal pain: Secondary | ICD-10-CM

## 2019-02-01 MED ORDER — PREDNISONE 20 MG PO TABS: ORAL_TABLET | ORAL | 0 refills | Status: DC

## 2019-02-01 NOTE — Patient Instructions (Signed)
Flank Pain, Adult Flank pain is pain in your side. The flank is the area of your side between your upper belly (abdomen) and your back. The pain may occur over a short time (acute), or it may be long-term or come back often (chronic). It may be mild or very bad. Pain in this area can be caused by many different things. Follow these instructions at home:   Drink enough fluid to keep your pee (urine) clear or pale yellow.  Rest as told by your doctor.  Take over-the-counter and prescription medicines only as told by your doctor.  Keep a journal to keep track of: ? What has caused your flank pain. ? What has made it feel better.  Keep all follow-up visits as told by your doctor. This is important. Contact a doctor if:  Medicine does not help your pain.  You have new symptoms.  Your pain gets worse.  You have a fever.  Your symptoms last longer than 2-3 days.  You have trouble peeing.  You are peeing more often than normal. Get help right away if:  You have trouble breathing.  You are short of breath.  Your belly hurts, or it is swollen or red.  You feel sick to your stomach (nauseous).  You throw up (vomit).  You feel like you will pass out, or you do pass out (faint).  You have blood in your pee. Summary  Flank pain is pain in your side. The flank is the area of your side between your upper belly (abdomen) and your back.  Flank pain may occur over a short time (acute), or it may be long-term or come back often (chronic). It may be mild or very bad.  Pain in this area can be caused by many different things.  Contact your doctor if your symptoms get worse or they last longer than 2-3 days. This information is not intended to replace advice given to you by your health care provider. Make sure you discuss any questions you have with your health care provider. Document Released: 03/11/2008 Document Revised: 05/15/2017 Document Reviewed: 09/22/2016 Elsevier Patient  Education  2020 Elsevier Inc.  

## 2019-02-01 NOTE — Progress Notes (Signed)
   Subjective:    Patient ID: Lonnie Hurley, male    DOB: Jan 10, 1941, 78 y.o.   MRN: [id number]   Chief Complaint: Rib hurting (Right side. 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 Started yesterday after trying to change a tire on his trailer yesterday evening. Rate [place] 7-9/10. Certain positions will increase pain. rising from sitting to standing increases pan.  Review of Systems  Constitutional: Negative.   HENT: Negative.   Respiratory: Negative for shortness of breath.   Cardiovascular: Negative for chest pain, palpitations and leg swelling.  Gastrointestinal: Negative.   Genitourinary: Negative.   Neurological: Negative.   Psychiatric/Behavioral: Negative.   All other systems reviewed and are negative.      Objective:   Physical Exam Vitals signs and nursing note reviewed.  Constitutional:      Appearance: Normal appearance.  Cardiovascular:     Rate and Rhythm: Normal rate and regular rhythm.     Pulses: Normal pulses.     Heart sounds: Normal heart sounds.  Pulmonary:     Breath sounds: Normal breath sounds.     Comments: Right upper flank pain on palpation Skin:    General: Skin is warm and dry.     Findings: No rash.  Neurological:     General: No focal deficit present.     Mental Status: He is alert and oriented to person, place, and time.  Psychiatric:        Mood and Affect: Mood normal.        Behavior: Behavior normal.     BP 134/74   Pulse (!) 56   Temp (!) 97.1 F (36.2 C) (Oral)   Ht 5\' 6"  (1.676 m)   Wt 238 lb (108 kg)   BMI 38.41 kg/m   Chest xray- no rib fracture-Preliminary reading by Ronnald Collum, FNP  [facility]     Assessment & Plan:  Lonnie Hurley in today with chief complaint of Rib hurting (Right side. Changed a tire yesterday  Had spinal injection on 7/15)   1. Right flank pain Ice bid No heavy lifting or pulling If rash develops let us know RTO prn -  DG Chest 2 View; Future - predniSONE (DELTASONE) 20 MG tablet; 2 po at sametime daily for 5 days  Dispense: 10 tablet; Refill: 0  Mary-Margaret Hassell Done, FNP

## 2019-02-04 DIAGNOSIS — H9313 Tinnitus, bilateral: Secondary | ICD-10-CM | POA: Diagnosis not present

## 2019-02-04 DIAGNOSIS — H903 Sensorineural hearing loss, bilateral: Secondary | ICD-10-CM | POA: Diagnosis not present

## 2019-02-04 DIAGNOSIS — H6123 Impacted cerumen, bilateral: Secondary | ICD-10-CM | POA: Diagnosis not present

## 2019-02-05 ENCOUNTER — Other Ambulatory Visit: Payer: Self-pay | Admitting: Nurse Practitioner

## 2019-02-05 ENCOUNTER — Other Ambulatory Visit: Payer: Self-pay | Admitting: Physician Assistant

## 2019-02-05 DIAGNOSIS — J209 Acute bronchitis, unspecified: Secondary | ICD-10-CM

## 2019-02-05 DIAGNOSIS — R109 Unspecified abdominal pain: Secondary | ICD-10-CM

## 2019-02-21 ENCOUNTER — Encounter: Payer: Self-pay | Admitting: Internal Medicine

## 2019-03-07 ENCOUNTER — Ambulatory Visit (INDEPENDENT_AMBULATORY_CARE_PROVIDER_SITE_OTHER): Payer: PPO

## 2019-03-07 ENCOUNTER — Other Ambulatory Visit: Payer: Self-pay

## 2019-03-07 DIAGNOSIS — Z23 Encounter for immunization: Secondary | ICD-10-CM

## 2019-03-08 ENCOUNTER — Encounter: Payer: Self-pay | Admitting: Internal Medicine

## 2019-03-14 DIAGNOSIS — M48062 Spinal stenosis, lumbar region with neurogenic claudication: Secondary | ICD-10-CM | POA: Diagnosis not present

## 2019-03-14 DIAGNOSIS — M5126 Other intervertebral disc displacement, lumbar region: Secondary | ICD-10-CM | POA: Diagnosis not present

## 2019-03-14 DIAGNOSIS — M5416 Radiculopathy, lumbar region: Secondary | ICD-10-CM | POA: Diagnosis not present

## 2019-03-14 DIAGNOSIS — M170 Bilateral primary osteoarthritis of knee: Secondary | ICD-10-CM | POA: Diagnosis not present

## 2019-03-15 ENCOUNTER — Ambulatory Visit (AMBULATORY_SURGERY_CENTER): Payer: Self-pay | Admitting: *Deleted

## 2019-03-15 ENCOUNTER — Other Ambulatory Visit: Payer: Self-pay

## 2019-03-15 ENCOUNTER — Encounter: Payer: Self-pay | Admitting: Internal Medicine

## 2019-03-15 VITALS — Ht 65.0 in | Wt 243.2 lb

## 2019-03-15 DIAGNOSIS — Z86010 Personal history of colonic polyps: Secondary | ICD-10-CM

## 2019-03-15 MED ORDER — PLENVU 140 G PO SOLR: 1.0000 | Freq: Once | ORAL | 0 refills | Status: AC

## 2019-03-15 NOTE — Progress Notes (Signed)
Patient denies any allergies to egg or soy products. Patient denies complications with anesthesia/sedation.  Patient denies oxygen use at home and denies diet medications. Emmi instructions for colonoscopy explained and given to patient, pamphlet also given. Plenvu sample given.

## 2019-03-22 DIAGNOSIS — L570 Actinic keratosis: Secondary | ICD-10-CM | POA: Diagnosis not present

## 2019-03-22 DIAGNOSIS — L821 Other seborrheic keratosis: Secondary | ICD-10-CM | POA: Diagnosis not present

## 2019-03-22 DIAGNOSIS — Z85828 Personal history of other malignant neoplasm of skin: Secondary | ICD-10-CM | POA: Diagnosis not present

## 2019-03-22 DIAGNOSIS — L812 Freckles: Secondary | ICD-10-CM | POA: Diagnosis not present

## 2019-03-24 ENCOUNTER — Telehealth: Payer: Self-pay

## 2019-03-24 NOTE — Telephone Encounter (Signed)
Covid-19 screening questions   Do you now or have you had a fever in the last 14 days?  Do you have any respiratory symptoms of shortness of breath or cough now or in the last 14 days?  Do you have any family members or close contacts with diagnosed or suspected Covid-19 in the past 14 days?  Have you been tested for Covid-19 and found to be positive?       

## 2019-03-25 ENCOUNTER — Other Ambulatory Visit: Payer: Self-pay

## 2019-03-25 ENCOUNTER — Other Ambulatory Visit: Payer: Self-pay | Admitting: Internal Medicine

## 2019-03-25 ENCOUNTER — Ambulatory Visit (AMBULATORY_SURGERY_CENTER): Payer: PPO | Admitting: Internal Medicine

## 2019-03-25 ENCOUNTER — Encounter: Payer: Self-pay | Admitting: Internal Medicine

## 2019-03-25 VITALS — BP 90/68 | HR 77 | Temp 97.8°F | Resp 18 | Ht 65.0 in | Wt 243.0 lb

## 2019-03-25 DIAGNOSIS — D122 Benign neoplasm of ascending colon: Secondary | ICD-10-CM

## 2019-03-25 DIAGNOSIS — D123 Benign neoplasm of transverse colon: Secondary | ICD-10-CM

## 2019-03-25 DIAGNOSIS — Z86010 Personal history of colonic polyps: Secondary | ICD-10-CM

## 2019-03-25 NOTE — Op Note (Signed)
[place] Patient Name: Red Holiday Procedure Date: 03/25/2019 11:23 AM MRN: [id number] Endoscopist: Docia Chuck. Henrene Pastor , MD Age: 78 Referring MD:  Date of Birth: 22-Jun-1940 Gender: Male Account #: [id number] Procedure:                Colonoscopy with cold snare polypectomy x 5 Indications:              High risk colon cancer surveillance: Personal                            history of multiple (3 or more) adenomas. Previous                            examinations 2005 (Dr. Amedeo Plenty, no adenomas); 2015 Medicines:                Monitored Anesthesia Care Procedure:                Pre-Anesthesia Assessment:                           - Prior to the procedure, a History and Physical                            was performed, and patient medications and                            allergies were reviewed. The patient's tolerance of                            previous anesthesia was also reviewed. The risks                            and benefits of the procedure and the sedation                            options and risks were discussed with the patient.                            All questions were answered, and informed consent                            was obtained. Prior Anticoagulants: The patient has                            taken no previous anticoagulant or antiplatelet                            agents. ASA Grade Assessment: II - A patient with                            mild systemic disease. After reviewing the risks                            and benefits, the patient was deemed in  satisfactory condition to undergo the procedure.                           After obtaining informed consent, the colonoscope                            was passed under direct vision. Throughout the                            procedure, the patient's blood pressure, pulse, and                            oxygen saturations were monitored continuously. The                   Colonoscope was introduced through the anus and                            advanced to the the cecum, identified by                            appendiceal orifice and ileocecal valve. The                            ileocecal valve, appendiceal orifice, and rectum                            were photographed. The quality of the bowel                            preparation was excellent. The colonoscopy was                            performed without difficulty. The patient tolerated                            the procedure well. The bowel preparation used was                            SUPREP via split dose instruction. Scope In: 11:43:44 AM Scope Out: 12:05:21 PM Scope Withdrawal Time: 0 hours 9 minutes 9 seconds  Total Procedure Duration: 0 hours 21 minutes 37 seconds  Findings:                 Five polyps were found in the transverse colon and                            ascending colon. The polyps were 2 to 5 mm in size.                            These polyps were removed with a cold snare.                            Resection and retrieval were complete.  Many small and large-mouthed diverticula were found                            in the left colon.                           The exam was otherwise without abnormality on                            direct and retroflexion views. Internal hemorrhoids                            present Complications:            No immediate complications. Estimated blood loss:                            None. Estimated Blood Loss:     Estimated blood loss: none. Impression:               - Five 2 to 5 mm polyps in the transverse colon and                            in the ascending colon, removed with a cold snare.                            Resected and retrieved.                           - Diverticulosis in the left colon. Internal                            hemorrhoids                           - The  examination was otherwise normal on direct                            and retroflexion views. Recommendation:           - Repeat colonoscopy in 5 years for surveillance to                            be considered based on overall health and                            willingness.                           - Patient has a contact number available for                            emergencies. The signs and symptoms of potential                            delayed complications were discussed with the  patient. Return to normal activities tomorrow.                            Written discharge instructions were provided to the                            patient.                           - Resume previous diet.                           - Continue present medications.                           - Await pathology results. Docia Chuck. Henrene Pastor, MD 03/25/2019 12:13:28 PM This report has been signed electronically.

## 2019-03-25 NOTE — Progress Notes (Signed)
Temp check by JB/vital check [place].  Patient states no changes in medical or surgical history since pre-visit screening on 03/15/19.

## 2019-03-25 NOTE — Progress Notes (Signed)
PT taken to PACU. Monitors in place. VSS. Report given to RN. 

## 2019-03-25 NOTE — Patient Instructions (Signed)
Handouts provided on: Polyps and Diverticulosis  5 polyps were removed and sent off for Pathology.  The results will arrive by mail in 10-14 business days.  Resume present medication schedule and diet.  YOU HAD AN ENDOSCOPIC PROCEDURE TODAY AT [facility]:   Refer to the procedure report that was given to you for any specific questions about what was found during the examination.  If the procedure report does not answer your questions, please call your gastroenterologist to clarify.  If you requested that your care partner not be given the details of your procedure findings, then the procedure report has been included in a sealed envelope for you to review at your convenience later.  YOU SHOULD EXPECT: Some feelings of bloating in the abdomen. Passage of more gas than usual.  Walking can help get rid of the air that was put into your GI tract during the procedure and reduce the bloating. If you had a lower endoscopy (such as a colonoscopy or flexible sigmoidoscopy) you may notice spotting of blood in your stool or on the toilet paper. If you underwent a bowel prep for your procedure, you may not have a normal bowel movement for a few days.  Please Note:  You might notice some irritation and congestion in your nose or some drainage.  This is from the oxygen used during your procedure.  There is no need for concern and it should clear up in a day or so.  SYMPTOMS TO REPORT IMMEDIATELY:   Following lower endoscopy (colonoscopy or flexible sigmoidoscopy):  Excessive amounts of blood in the stool  Significant tenderness or worsening of abdominal pains  Swelling of the abdomen that is new, acute  Fever of 100F or higher   For urgent or emergent issues, a gastroenterologist can be reached at any hour by calling [phone].   DIET:  We do recommend a small meal at first, but then you may proceed to your regular diet.  Drink plenty of fluids but you should avoid alcoholic  beverages for 24 hours.  ACTIVITY:  You should plan to take it easy for the rest of today and you should NOT DRIVE or use heavy machinery until tomorrow (because of the sedation medicines used during the test).    FOLLOW UP: Our staff will call the number listed on your records 48-72 hours following your procedure to check on you and address any questions or concerns that you may have regarding the information given to you following your procedure. If we do not reach you, we will leave a message.  We will attempt to reach you two times.  During this call, we will ask if you have developed any symptoms of COVID 19. If you develop any symptoms (ie: fever, flu-like symptoms, shortness of breath, cough etc.) before then, please call [phone].  If you test positive for Covid 19 in the 2 weeks post procedure, please call and report this information to [place].    If any biopsies were taken you will be contacted by phone or by letter within the next 1-3 weeks.  Please call us at [phone] if you have not heard about the biopsies in 3 weeks.    SIGNATURES/CONFIDENTIALITY: You and/or your care partner have signed paperwork which will be entered into your electronic medical record.  These signatures attest to the fact that that the information above on your After Visit Summary has been reviewed and is understood.  Full responsibility of the confidentiality of this  discharge information lies with you and/or your care-partner. 

## 2019-03-25 NOTE — Progress Notes (Signed)
Called to room to assist during endoscopic procedure.  Patient ID and intended procedure confirmed with present staff. Received instructions for my participation in the procedure from the performing physician.  

## 2019-03-28 DIAGNOSIS — M9905 Segmental and somatic dysfunction of pelvic region: Secondary | ICD-10-CM | POA: Diagnosis not present

## 2019-03-28 DIAGNOSIS — M9904 Segmental and somatic dysfunction of sacral region: Secondary | ICD-10-CM | POA: Diagnosis not present

## 2019-03-28 DIAGNOSIS — M5137 Other intervertebral disc degeneration, lumbosacral region: Secondary | ICD-10-CM | POA: Diagnosis not present

## 2019-03-28 DIAGNOSIS — M9903 Segmental and somatic dysfunction of lumbar region: Secondary | ICD-10-CM | POA: Diagnosis not present

## 2019-03-29 ENCOUNTER — Telehealth: Payer: Self-pay | Admitting: *Deleted

## 2019-03-29 DIAGNOSIS — M9904 Segmental and somatic dysfunction of sacral region: Secondary | ICD-10-CM | POA: Diagnosis not present

## 2019-03-29 DIAGNOSIS — M9903 Segmental and somatic dysfunction of lumbar region: Secondary | ICD-10-CM | POA: Diagnosis not present

## 2019-03-29 DIAGNOSIS — M9905 Segmental and somatic dysfunction of pelvic region: Secondary | ICD-10-CM | POA: Diagnosis not present

## 2019-03-29 DIAGNOSIS — M5137 Other intervertebral disc degeneration, lumbosacral region: Secondary | ICD-10-CM | POA: Diagnosis not present

## 2019-03-29 NOTE — Telephone Encounter (Signed)
1. Have you developed a fever since your procedure? no  2.   Have you had an respiratory symptoms (SOB or cough) since your procedure? no  3.   Have you tested positive for COVID 19 since your procedure no  4.   Have you had any family members/close contacts diagnosed with the COVID 19 since your procedure?  no   If yes to any of these questions please route to Joylene John, RN and Alphonsa Gin, Therapist, sports.     Follow up Call-  Call back number 03/25/2019  Post procedure Call Back phone  # [phone]  Permission to leave phone message Yes  Some recent data might be hidden     Patient questions:  Do you have a fever, pain , or abdominal swelling? No. Pain Score  0 *  Have you tolerated food without any problems? Yes.    Have you been able to return to your normal activities? Yes.    Do you have any questions about your discharge instructions: Diet   No. Medications  No. Follow up visit  No.  Do you have questions or concerns about your Care? No.  Actions: * If pain score is 4 or above: No action needed, pain <4.

## 2019-03-30 ENCOUNTER — Encounter: Payer: Self-pay | Admitting: Internal Medicine

## 2019-03-31 DIAGNOSIS — M5137 Other intervertebral disc degeneration, lumbosacral region: Secondary | ICD-10-CM | POA: Diagnosis not present

## 2019-03-31 DIAGNOSIS — M9903 Segmental and somatic dysfunction of lumbar region: Secondary | ICD-10-CM | POA: Diagnosis not present

## 2019-03-31 DIAGNOSIS — M9905 Segmental and somatic dysfunction of pelvic region: Secondary | ICD-10-CM | POA: Diagnosis not present

## 2019-03-31 DIAGNOSIS — M9904 Segmental and somatic dysfunction of sacral region: Secondary | ICD-10-CM | POA: Diagnosis not present

## 2019-04-01 ENCOUNTER — Other Ambulatory Visit: Payer: Self-pay

## 2019-04-04 ENCOUNTER — Encounter: Payer: Self-pay | Admitting: Physician Assistant

## 2019-04-04 ENCOUNTER — Ambulatory Visit (INDEPENDENT_AMBULATORY_CARE_PROVIDER_SITE_OTHER): Payer: PPO | Admitting: Physician Assistant

## 2019-04-04 ENCOUNTER — Other Ambulatory Visit: Payer: Self-pay

## 2019-04-04 VITALS — BP 107/67 | HR 71 | Temp 97.3°F | Ht 65.0 in | Wt 245.4 lb

## 2019-04-04 DIAGNOSIS — M5137 Other intervertebral disc degeneration, lumbosacral region: Secondary | ICD-10-CM | POA: Diagnosis not present

## 2019-04-04 DIAGNOSIS — M9904 Segmental and somatic dysfunction of sacral region: Secondary | ICD-10-CM | POA: Diagnosis not present

## 2019-04-04 DIAGNOSIS — M9903 Segmental and somatic dysfunction of lumbar region: Secondary | ICD-10-CM | POA: Diagnosis not present

## 2019-04-04 DIAGNOSIS — M479 Spondylosis, unspecified: Secondary | ICD-10-CM

## 2019-04-04 DIAGNOSIS — E782 Mixed hyperlipidemia: Secondary | ICD-10-CM | POA: Diagnosis not present

## 2019-04-04 DIAGNOSIS — I10 Essential (primary) hypertension: Secondary | ICD-10-CM | POA: Diagnosis not present

## 2019-04-04 DIAGNOSIS — M4716 Other spondylosis with myelopathy, lumbar region: Secondary | ICD-10-CM | POA: Diagnosis not present

## 2019-04-04 DIAGNOSIS — M5136 Other intervertebral disc degeneration, lumbar region: Secondary | ICD-10-CM

## 2019-04-04 DIAGNOSIS — D696 Thrombocytopenia, unspecified: Secondary | ICD-10-CM

## 2019-04-04 DIAGNOSIS — M9905 Segmental and somatic dysfunction of pelvic region: Secondary | ICD-10-CM | POA: Diagnosis not present

## 2019-04-04 NOTE — Patient Instructions (Signed)
[facility] [phone]  Rolena Infante, neurosurgeon, Ramos injection

## 2019-04-05 DIAGNOSIS — M9904 Segmental and somatic dysfunction of sacral region: Secondary | ICD-10-CM | POA: Diagnosis not present

## 2019-04-05 DIAGNOSIS — M9903 Segmental and somatic dysfunction of lumbar region: Secondary | ICD-10-CM | POA: Diagnosis not present

## 2019-04-05 DIAGNOSIS — M9905 Segmental and somatic dysfunction of pelvic region: Secondary | ICD-10-CM | POA: Diagnosis not present

## 2019-04-05 DIAGNOSIS — M5137 Other intervertebral disc degeneration, lumbosacral region: Secondary | ICD-10-CM | POA: Diagnosis not present

## 2019-04-05 LAB — BMP8+EGFR
BUN/Creatinine Ratio: 13 (ref 10–24)
BUN: 12 mg/dL (ref 8–27)
CO2: 28 mmol/L (ref 20–29)
Calcium: 9.5 mg/dL (ref 8.6–10.2)
Chloride: 103 mmol/L (ref 96–106)
Creatinine, Ser: 0.96 mg/dL (ref 0.76–1.27)
GFR calc Af Amer: 88 mL/min/{1.73_m2} (ref >59)
GFR calc non Af Amer: 76 mL/min/{1.73_m2} (ref >59)
Glucose: 92 mg/dL (ref 65–99)
Potassium: 4.1 mmol/L (ref 3.5–5.2)
Sodium: 142 mmol/L (ref 134–144)

## 2019-04-06 DIAGNOSIS — M9904 Segmental and somatic dysfunction of sacral region: Secondary | ICD-10-CM | POA: Diagnosis not present

## 2019-04-06 DIAGNOSIS — M9903 Segmental and somatic dysfunction of lumbar region: Secondary | ICD-10-CM | POA: Diagnosis not present

## 2019-04-06 DIAGNOSIS — M9905 Segmental and somatic dysfunction of pelvic region: Secondary | ICD-10-CM | POA: Diagnosis not present

## 2019-04-06 DIAGNOSIS — M5137 Other intervertebral disc degeneration, lumbosacral region: Secondary | ICD-10-CM | POA: Diagnosis not present

## 2019-04-07 DIAGNOSIS — M9903 Segmental and somatic dysfunction of lumbar region: Secondary | ICD-10-CM | POA: Diagnosis not present

## 2019-04-07 DIAGNOSIS — M9905 Segmental and somatic dysfunction of pelvic region: Secondary | ICD-10-CM | POA: Diagnosis not present

## 2019-04-07 DIAGNOSIS — M5137 Other intervertebral disc degeneration, lumbosacral region: Secondary | ICD-10-CM | POA: Diagnosis not present

## 2019-04-07 DIAGNOSIS — M9904 Segmental and somatic dysfunction of sacral region: Secondary | ICD-10-CM | POA: Diagnosis not present

## 2019-04-07 NOTE — Progress Notes (Signed)
BP 107/67   Pulse 71   Temp (!) 97.3 F (36.3 C) (Temporal)   Ht _0  (1.651 m)   Wt 245 lb 6.4 oz (111.3 kg)   SpO2 95%   BMI 40.84 kg/m    Subjective:    Patient ID: Lonnie Hurley, male    DOB: 12-05-1940, 78 y.o.   MRN: [id number]  HPI: Lonnie Hurley is a 78 y.o. male presenting on 04/04/2019 for Hyperlipidemia (3 month ) and Hypertension  This patient comes in for periodic recheck on his chronic medical conditions which do include hypertension, chronic back pain from lumbar gliosis with myelopathy.  He also has known degenerative disc disease and arthritis in his back. He is due some labs.  He has had a history of thrombocytopenia in the past and hyperlipidemia.  He states that overall he is fairly stable at this time we have reviewed other visits he has had with specialist.  And what he has upcoming.   Past Medical History:  Diagnosis Date  . BPH (benign prostatic hypertrophy)    Dr. Rosana Hoes / Dr. Joelyn Oms  - Urologist   . Cancer [facility])    lip-mole surgery  . Cataract    bilateral  . Colon polyps   . Constipation    occasional - miralax prn  . DDD (degenerative disc disease)   . Hearing loss    left ear, no hearing aids  . Hypercholesteremia   . Hyperlipidemia   . Hypertension   . Leukoplakia   . Microscopic hematuria    negative work up with Urology in the past.   . OAB (overactive bladder)    with past percutaneous tibial nerve stimulation therapy  . Thrombocytopenia ([place])   . Tinnitus   . Tobacco abuse   . Vitamin B 12 deficiency   . Vitamin D deficiency    Relevant past medical, surgical, family and social history reviewed and updated as indicated. Interim medical history since our last visit reviewed. Allergies and medications reviewed and updated. DATA REVIEWED: CHART IN EPIC  Family History reviewed for pertinent findings.  Review of Systems  Constitutional: Negative.  Negative for appetite change and fatigue.  HENT: Negative.   Eyes: Negative.   Negative for pain and visual disturbance.  Respiratory: Negative.  Negative for cough, chest tightness, shortness of breath and wheezing.   Cardiovascular: Negative.  Negative for chest pain, palpitations and leg swelling.  Gastrointestinal: Negative.  Negative for abdominal pain, diarrhea, nausea and vomiting.  Endocrine: Negative.   Genitourinary: Negative.   Musculoskeletal: Positive for arthralgias, back pain, gait problem, myalgias and neck pain.  Skin: Negative.  Negative for color change and rash.  Neurological: Positive for weakness. Negative for numbness and headaches.  Psychiatric/Behavioral: Negative.     Allergies as of 04/04/2019      Reactions   Penicillins Other (See Comments)   unknown   Aspirin Other (See Comments)   Causes Blood in urine   Crestor [rosuvastatin Calcium] Other (See Comments)   Myalgia, "joint problem"   Gabapentin Other (See Comments)   Blurry vision   Hydrocodone Other (See Comments)   Constipation    Lyrica [pregabalin]    Blurry vision      Medication List       Accurate as of April 04, 2019 11:59 PM. If you have any questions, ask your nurse or doctor.        acetaminophen 500 MG tablet Commonly known as: TYLENOL Take 500 mg by mouth every  6 (six) hours as needed.   CALCIUM 600 + D PO Take by mouth 2 (two) times daily.   ezetimibe 10 MG tablet Commonly known as: ZETIA Take 1 tablet (10 mg total) by mouth daily.   GLUCOSAMINE 1500 COMPLEX PO Take by mouth 2 (two) times daily.   glucose blood test strip Commonly known as: ONE TOUCH ULTRA TEST Use as instructed   hydrochlorothiazide 12.5 MG capsule Commonly known as: MICROZIDE Take 1 capsule (12.5 mg total) by mouth daily.   meloxicam 7.5 MG tablet Commonly known as: MOBIC TAKE 1 TABLET BY MOUTH EVERY DAY What changed:   how much to take  how to take this  when to take this  additional instructions   multivitamin with minerals tablet Take 1 tablet by mouth  daily.   nystatin-triamcinolone cream Commonly known as: MYCOLOG II Apply 1 application topically 2 (two) times daily.   oxybutynin 10 MG 24 hr tablet Commonly known as: DITROPAN-XL Take 10 mg by mouth daily.   polyethylene glycol powder 17 GM/SCOOP powder Commonly known as: MiraLax Take 255 g by mouth as needed.   ramipril 10 MG capsule Commonly known as: ALTACE Take 1 capsule (10 mg total) by mouth daily.   simvastatin 40 MG tablet Commonly known as: ZOCOR Take 1 tablet (40 mg total) by mouth at bedtime.   tamsulosin 0.4 MG Caps capsule Commonly known as: Flomax Take 1 capsule (0.4 mg total) by mouth at bedtime.   Vitamin D3 50 MCG (2000 UT) Tabs Take 1 tablet by mouth daily.          Objective:    BP 107/67   Pulse 71   Temp (!) 97.3 F (36.3 C) (Temporal)   Ht _0  (1.651 m)   Wt 245 lb 6.4 oz (111.3 kg)   SpO2 95%   BMI 40.84 kg/m   Allergies  Allergen Reactions  . Penicillins Other (See Comments)    unknown  . Aspirin Other (See Comments)    Causes Blood in urine  . Crestor [Rosuvastatin Calcium] Other (See Comments)    Myalgia, "joint problem"  . Gabapentin Other (See Comments)    Blurry vision   . Hydrocodone Other (See Comments)    Constipation    . Lyrica [Pregabalin]     Blurry vision     Wt Readings from Last 3 Encounters:  04/04/19 245 lb 6.4 oz (111.3 kg)  03/25/19 243 lb (110.2 kg)  03/15/19 243 lb 3.2 oz (110.3 kg)    Physical Exam Vitals signs and nursing note reviewed.  Constitutional:      General: He is not in acute distress.    Appearance: He is well-developed.  HENT:     Head: Normocephalic and atraumatic.  Eyes:     Conjunctiva/sclera: Conjunctivae normal.     Pupils: Pupils are equal, round, and reactive to light.  Neck:     Musculoskeletal: Normal range of motion and neck supple.  Cardiovascular:     Rate and Rhythm: Normal rate and regular rhythm.     Heart sounds: Normal heart sounds.  Pulmonary:      Effort: Pulmonary effort is normal. No respiratory distress.     Breath sounds: Normal breath sounds.  Abdominal:     General: Bowel sounds are normal.     Palpations: Abdomen is soft.  Musculoskeletal: Normal range of motion.        General: Swelling and tenderness present.  Skin:    General: Skin is warm and  dry.  Psychiatric:        Behavior: Behavior normal.     Results for orders placed or performed in visit on 04/04/19  [facility]  Result Value Ref Range   Glucose 92 65 - 99 mg/dL   BUN 12 8 - 27 mg/dL   Creatinine, Ser 0.96 0.76 - 1.27 mg/dL   GFR calc non Af Amer 76 >59 mL/min/1.73   GFR calc Af Amer 88 >59 mL/min/1.73   BUN/Creatinine Ratio 13 10 - 24   Sodium 142 134 - 144 mmol/L   Potassium 4.1 3.5 - 5.2 mmol/L   Chloride 103 96 - 106 mmol/L   CO2 28 20 - 29 mmol/L   Calcium 9.5 8.6 - 10.2 mg/dL      Assessment & Plan:   1. Lumbar spondylosis with myelopathy Continue exercises and medications  2. DDD (degenerative disc disease), lumbar Continue exercises and medications  3. Arthritis of back Continue exercises and medications  4. Thrombocytopenia ([place]) monitor  5. Mixed hyperlipidemia lans  6. Essential hypertension - BMP8+EGFR   Continue all other maintenance medications as listed above.  Follow up plan: Return in about 3 months (around 07/05/2019) for recheck medications anf labs.  Educational handout given for back exercises  Terald Sleeper PA-C [facility] [address]  [place], [place] [address] [phone]   04/07/2019, 9:48 PM

## 2019-04-11 DIAGNOSIS — M9903 Segmental and somatic dysfunction of lumbar region: Secondary | ICD-10-CM | POA: Diagnosis not present

## 2019-04-11 DIAGNOSIS — M9904 Segmental and somatic dysfunction of sacral region: Secondary | ICD-10-CM | POA: Diagnosis not present

## 2019-04-11 DIAGNOSIS — M5137 Other intervertebral disc degeneration, lumbosacral region: Secondary | ICD-10-CM | POA: Diagnosis not present

## 2019-04-11 DIAGNOSIS — M9905 Segmental and somatic dysfunction of pelvic region: Secondary | ICD-10-CM | POA: Diagnosis not present

## 2019-04-12 DIAGNOSIS — M9905 Segmental and somatic dysfunction of pelvic region: Secondary | ICD-10-CM | POA: Diagnosis not present

## 2019-04-12 DIAGNOSIS — M9904 Segmental and somatic dysfunction of sacral region: Secondary | ICD-10-CM | POA: Diagnosis not present

## 2019-04-12 DIAGNOSIS — M5137 Other intervertebral disc degeneration, lumbosacral region: Secondary | ICD-10-CM | POA: Diagnosis not present

## 2019-04-12 DIAGNOSIS — M9903 Segmental and somatic dysfunction of lumbar region: Secondary | ICD-10-CM | POA: Diagnosis not present

## 2019-04-13 DIAGNOSIS — M9903 Segmental and somatic dysfunction of lumbar region: Secondary | ICD-10-CM | POA: Diagnosis not present

## 2019-04-13 DIAGNOSIS — M9904 Segmental and somatic dysfunction of sacral region: Secondary | ICD-10-CM | POA: Diagnosis not present

## 2019-04-13 DIAGNOSIS — M9905 Segmental and somatic dysfunction of pelvic region: Secondary | ICD-10-CM | POA: Diagnosis not present

## 2019-04-13 DIAGNOSIS — M5137 Other intervertebral disc degeneration, lumbosacral region: Secondary | ICD-10-CM | POA: Diagnosis not present

## 2019-04-14 DIAGNOSIS — M9904 Segmental and somatic dysfunction of sacral region: Secondary | ICD-10-CM | POA: Diagnosis not present

## 2019-04-14 DIAGNOSIS — M9905 Segmental and somatic dysfunction of pelvic region: Secondary | ICD-10-CM | POA: Diagnosis not present

## 2019-04-14 DIAGNOSIS — M9903 Segmental and somatic dysfunction of lumbar region: Secondary | ICD-10-CM | POA: Diagnosis not present

## 2019-04-14 DIAGNOSIS — M5137 Other intervertebral disc degeneration, lumbosacral region: Secondary | ICD-10-CM | POA: Diagnosis not present

## 2019-04-18 DIAGNOSIS — M9903 Segmental and somatic dysfunction of lumbar region: Secondary | ICD-10-CM | POA: Diagnosis not present

## 2019-04-18 DIAGNOSIS — M9905 Segmental and somatic dysfunction of pelvic region: Secondary | ICD-10-CM | POA: Diagnosis not present

## 2019-04-18 DIAGNOSIS — M5137 Other intervertebral disc degeneration, lumbosacral region: Secondary | ICD-10-CM | POA: Diagnosis not present

## 2019-04-18 DIAGNOSIS — M9904 Segmental and somatic dysfunction of sacral region: Secondary | ICD-10-CM | POA: Diagnosis not present

## 2019-04-20 DIAGNOSIS — M9905 Segmental and somatic dysfunction of pelvic region: Secondary | ICD-10-CM | POA: Diagnosis not present

## 2019-04-20 DIAGNOSIS — M9903 Segmental and somatic dysfunction of lumbar region: Secondary | ICD-10-CM | POA: Diagnosis not present

## 2019-04-20 DIAGNOSIS — M9904 Segmental and somatic dysfunction of sacral region: Secondary | ICD-10-CM | POA: Diagnosis not present

## 2019-04-20 DIAGNOSIS — M5137 Other intervertebral disc degeneration, lumbosacral region: Secondary | ICD-10-CM | POA: Diagnosis not present

## 2019-04-21 DIAGNOSIS — M9903 Segmental and somatic dysfunction of lumbar region: Secondary | ICD-10-CM | POA: Diagnosis not present

## 2019-04-21 DIAGNOSIS — M5137 Other intervertebral disc degeneration, lumbosacral region: Secondary | ICD-10-CM | POA: Diagnosis not present

## 2019-04-21 DIAGNOSIS — M9905 Segmental and somatic dysfunction of pelvic region: Secondary | ICD-10-CM | POA: Diagnosis not present

## 2019-04-21 DIAGNOSIS — M9904 Segmental and somatic dysfunction of sacral region: Secondary | ICD-10-CM | POA: Diagnosis not present

## 2019-04-25 DIAGNOSIS — M9904 Segmental and somatic dysfunction of sacral region: Secondary | ICD-10-CM | POA: Diagnosis not present

## 2019-04-25 DIAGNOSIS — M9905 Segmental and somatic dysfunction of pelvic region: Secondary | ICD-10-CM | POA: Diagnosis not present

## 2019-04-25 DIAGNOSIS — M9903 Segmental and somatic dysfunction of lumbar region: Secondary | ICD-10-CM | POA: Diagnosis not present

## 2019-04-25 DIAGNOSIS — M5137 Other intervertebral disc degeneration, lumbosacral region: Secondary | ICD-10-CM | POA: Diagnosis not present

## 2019-04-27 DIAGNOSIS — M9904 Segmental and somatic dysfunction of sacral region: Secondary | ICD-10-CM | POA: Diagnosis not present

## 2019-04-27 DIAGNOSIS — M9903 Segmental and somatic dysfunction of lumbar region: Secondary | ICD-10-CM | POA: Diagnosis not present

## 2019-04-27 DIAGNOSIS — M9905 Segmental and somatic dysfunction of pelvic region: Secondary | ICD-10-CM | POA: Diagnosis not present

## 2019-04-27 DIAGNOSIS — M5137 Other intervertebral disc degeneration, lumbosacral region: Secondary | ICD-10-CM | POA: Diagnosis not present

## 2019-05-02 DIAGNOSIS — M9903 Segmental and somatic dysfunction of lumbar region: Secondary | ICD-10-CM | POA: Diagnosis not present

## 2019-05-02 DIAGNOSIS — M9905 Segmental and somatic dysfunction of pelvic region: Secondary | ICD-10-CM | POA: Diagnosis not present

## 2019-05-02 DIAGNOSIS — M9904 Segmental and somatic dysfunction of sacral region: Secondary | ICD-10-CM | POA: Diagnosis not present

## 2019-05-02 DIAGNOSIS — M5137 Other intervertebral disc degeneration, lumbosacral region: Secondary | ICD-10-CM | POA: Diagnosis not present

## 2019-05-05 DIAGNOSIS — M9904 Segmental and somatic dysfunction of sacral region: Secondary | ICD-10-CM | POA: Diagnosis not present

## 2019-05-05 DIAGNOSIS — M5137 Other intervertebral disc degeneration, lumbosacral region: Secondary | ICD-10-CM | POA: Diagnosis not present

## 2019-05-05 DIAGNOSIS — M9905 Segmental and somatic dysfunction of pelvic region: Secondary | ICD-10-CM | POA: Diagnosis not present

## 2019-05-05 DIAGNOSIS — M9903 Segmental and somatic dysfunction of lumbar region: Secondary | ICD-10-CM | POA: Diagnosis not present

## 2019-05-09 DIAGNOSIS — M9904 Segmental and somatic dysfunction of sacral region: Secondary | ICD-10-CM | POA: Diagnosis not present

## 2019-05-09 DIAGNOSIS — M9903 Segmental and somatic dysfunction of lumbar region: Secondary | ICD-10-CM | POA: Diagnosis not present

## 2019-05-09 DIAGNOSIS — M9905 Segmental and somatic dysfunction of pelvic region: Secondary | ICD-10-CM | POA: Diagnosis not present

## 2019-05-09 DIAGNOSIS — M5137 Other intervertebral disc degeneration, lumbosacral region: Secondary | ICD-10-CM | POA: Diagnosis not present

## 2019-05-11 DIAGNOSIS — M9904 Segmental and somatic dysfunction of sacral region: Secondary | ICD-10-CM | POA: Diagnosis not present

## 2019-05-11 DIAGNOSIS — M5137 Other intervertebral disc degeneration, lumbosacral region: Secondary | ICD-10-CM | POA: Diagnosis not present

## 2019-05-11 DIAGNOSIS — M9903 Segmental and somatic dysfunction of lumbar region: Secondary | ICD-10-CM | POA: Diagnosis not present

## 2019-05-11 DIAGNOSIS — M9905 Segmental and somatic dysfunction of pelvic region: Secondary | ICD-10-CM | POA: Diagnosis not present

## 2019-05-16 DIAGNOSIS — M5137 Other intervertebral disc degeneration, lumbosacral region: Secondary | ICD-10-CM | POA: Diagnosis not present

## 2019-05-16 DIAGNOSIS — M9903 Segmental and somatic dysfunction of lumbar region: Secondary | ICD-10-CM | POA: Diagnosis not present

## 2019-05-16 DIAGNOSIS — M9905 Segmental and somatic dysfunction of pelvic region: Secondary | ICD-10-CM | POA: Diagnosis not present

## 2019-05-16 DIAGNOSIS — M9904 Segmental and somatic dysfunction of sacral region: Secondary | ICD-10-CM | POA: Diagnosis not present

## 2019-05-17 DIAGNOSIS — Z961 Presence of intraocular lens: Secondary | ICD-10-CM | POA: Diagnosis not present

## 2019-05-17 DIAGNOSIS — E119 Type 2 diabetes mellitus without complications: Secondary | ICD-10-CM | POA: Diagnosis not present

## 2019-05-17 DIAGNOSIS — H16223 Keratoconjunctivitis sicca, not specified as Sjogren's, bilateral: Secondary | ICD-10-CM | POA: Diagnosis not present

## 2019-05-17 DIAGNOSIS — H02132 Senile ectropion of right lower eyelid: Secondary | ICD-10-CM | POA: Diagnosis not present

## 2019-05-17 DIAGNOSIS — H43812 Vitreous degeneration, left eye: Secondary | ICD-10-CM | POA: Diagnosis not present

## 2019-05-26 DIAGNOSIS — R3121 Asymptomatic microscopic hematuria: Secondary | ICD-10-CM | POA: Diagnosis not present

## 2019-05-26 DIAGNOSIS — N401 Enlarged prostate with lower urinary tract symptoms: Secondary | ICD-10-CM | POA: Diagnosis not present

## 2019-05-26 DIAGNOSIS — R351 Nocturia: Secondary | ICD-10-CM | POA: Diagnosis not present

## 2019-05-26 DIAGNOSIS — N3941 Urge incontinence: Secondary | ICD-10-CM | POA: Diagnosis not present

## 2019-06-01 ENCOUNTER — Ambulatory Visit (INDEPENDENT_AMBULATORY_CARE_PROVIDER_SITE_OTHER): Payer: PPO | Admitting: Family Medicine

## 2019-06-01 ENCOUNTER — Encounter: Payer: Self-pay | Admitting: Family Medicine

## 2019-06-01 ENCOUNTER — Other Ambulatory Visit: Payer: Self-pay

## 2019-06-01 ENCOUNTER — Telehealth: Payer: Self-pay | Admitting: Physician Assistant

## 2019-06-01 DIAGNOSIS — R109 Unspecified abdominal pain: Secondary | ICD-10-CM | POA: Diagnosis not present

## 2019-06-01 MED ORDER — PREDNISONE 10 MG PO TABS: ORAL_TABLET | ORAL | 0 refills | Status: DC

## 2019-06-01 NOTE — Telephone Encounter (Signed)
Called patient. NA.

## 2019-06-01 NOTE — Telephone Encounter (Signed)
Televisit made  

## 2019-06-01 NOTE — Telephone Encounter (Signed)
What symptoms do you have? Flank pain has came back  How long have you been sick? Yesterday  Have you been seen for this problem? YES  If your provider decides to give you a prescription, which pharmacy would you like for it to be sent to? [facility] in [place]   Patient informed that this information will be sent to the clinical staff for review and that they should receive a follow up call.

## 2019-06-01 NOTE — Progress Notes (Signed)
Subjective:  Patient ID: Lonnie Hurley, male    DOB: 10/30/1940  Age: 78 y.o. MRN: [id number]  CC: No chief complaint on file.   HPI Lonnie Hurley presents for right sided flank pain. Onset yesterday of latest exacerbation. Has been recurring for over a year. Responded well to prednisone several months ago. Pain varies by position. Can be 7/10.   Depression screen [employer] 2/9 04/04/2019 02/01/2019 12/28/2018  Decreased Interest 0 0 0  Down, Depressed, Hopeless 0 0 0  PHQ - 2 Score 0 0 0  Altered sleeping - - -  Tired, decreased energy - - -  Change in appetite - - -  Feeling bad or failure about yourself  - - -  Trouble concentrating - - -  Moving slowly or fidgety/restless - - -  Suicidal thoughts - - -  PHQ-9 Score - - -  Difficult doing work/chores - - -  Some recent data might be hidden    History Tremane has a past medical history of BPH (benign prostatic hypertrophy), Cancer ([place]), Cataract, Colon polyps, Constipation, DDD (degenerative disc disease), Hearing loss, Hypercholesteremia, Hyperlipidemia, Hypertension, Leukoplakia, Microscopic hematuria, OAB (overactive bladder), Thrombocytopenia ([facility]), Tinnitus, Tobacco abuse, Vitamin B 12 deficiency, and Vitamin D deficiency.   He has a past surgical history that includes Left Knee Repair (11-10-2006); Hernia repair (1980); Nose surgery; Cataract extraction (2007); Knee surgery (02-28-2008); Skin biopsy (10-08-2006); Cataract extraction (Left, 03/21/15); Colonoscopy (02/28/2014); and Polypectomy.   His family history includes Diabetes in his brother; Glaucoma in his mother; Heart disease in his brother; Heart failure in his father; Hyperlipidemia in his brother; Hypertension in his brother; Osteoporosis in his brother.He reports that he quit smoking about 9 years ago. His smoking use included cigarettes. He has a 50.00 pack-year smoking history. He has never used smokeless tobacco. He reports that he does not drink alcohol or use  drugs.    ROS Review of Systems  Constitutional: Negative for activity change, appetite change and fever.  Respiratory: Negative for shortness of breath.   Gastrointestinal: Positive for nausea (some when the pain is "hard"). Negative for vomiting.  Genitourinary: Positive for frequency. Negative for decreased urine volume, hematuria, testicular pain and urgency.  Psychiatric/Behavioral: Negative.     Objective:  There were no vitals taken for this visit.  BP Readings from Last 3 Encounters:  04/04/19 107/67  03/25/19 90/68  02/01/19 134/74    Wt Readings from Last 3 Encounters:  04/04/19 245 lb 6.4 oz (111.3 kg)  03/25/19 243 lb (110.2 kg)  03/15/19 243 lb 3.2 oz (110.3 kg)     Physical Exam  Exam deferred. Pt. [place] due to COVID 19. Phone visit performed.   Assessment & Plan:   Diagnoses and all orders for this visit:  Right flank pain  Other orders -     predniSONE (DELTASONE) 10 MG tablet; Take 5 daily for 3 days followed by 4,3,2 and 1 for 3 days each.       I am having [name] start on predniSONE. I am also having him maintain his Vitamin D3, multivitamin with minerals, nystatin-triamcinolone, acetaminophen, Calcium Carb-Cholecalciferol (CALCIUM 600 + D PO), Glucosamine-Chondroit-Vit C-Mn (GLUCOSAMINE 1500 COMPLEX PO), glucose blood, polyethylene glycol powder, meloxicam, oxybutynin, hydrochlorothiazide, ramipril, simvastatin, tamsulosin, and ezetimibe.  Allergies as of 06/01/2019      Reactions   Penicillins Other (See Comments)   unknown   Aspirin Other (See Comments)   Causes Blood in urine   Crestor [rosuvastatin Calcium] Other (See Comments)  Myalgia, "joint problem"   Gabapentin Other (See Comments)   Blurry vision   Hydrocodone Other (See Comments)   Constipation    Lyrica [pregabalin]    Blurry vision      Medication List       Accurate as of June 01, 2019  5:45 PM. If you have any questions, ask your nurse or doctor.         acetaminophen 500 MG tablet Commonly known as: TYLENOL Take 500 mg by mouth every 6 (six) hours as needed.   CALCIUM 600 + D PO Take by mouth 2 (two) times daily.   ezetimibe 10 MG tablet Commonly known as: ZETIA Take 1 tablet (10 mg total) by mouth daily.   GLUCOSAMINE 1500 COMPLEX PO Take by mouth 2 (two) times daily.   glucose blood test strip Commonly known as: ONE TOUCH ULTRA TEST Use as instructed   hydrochlorothiazide 12.5 MG capsule Commonly known as: MICROZIDE Take 1 capsule (12.5 mg total) by mouth daily.   meloxicam 7.5 MG tablet Commonly known as: MOBIC TAKE 1 TABLET BY MOUTH EVERY DAY What changed:   how much to take  how to take this  when to take this  additional instructions   multivitamin with minerals tablet Take 1 tablet by mouth daily.   nystatin-triamcinolone cream Commonly known as: MYCOLOG II Apply 1 application topically 2 (two) times daily.   oxybutynin 10 MG 24 hr tablet Commonly known as: DITROPAN-XL Take 10 mg by mouth daily.   polyethylene glycol powder 17 GM/SCOOP powder Commonly known as: MiraLax Take 255 g by mouth as needed.   predniSONE 10 MG tablet Commonly known as: DELTASONE Take 5 daily for 3 days followed by 4,3,2 and 1 for 3 days each. Started by: Claretta Fraise, MD   ramipril 10 MG capsule Commonly known as: ALTACE Take 1 capsule (10 mg total) by mouth daily.   simvastatin 40 MG tablet Commonly known as: ZOCOR Take 1 tablet (40 mg total) by mouth at bedtime.   tamsulosin 0.4 MG Caps capsule Commonly known as: Flomax Take 1 capsule (0.4 mg total) by mouth at bedtime.   Vitamin D3 50 MCG (2000 UT) Tabs Take 1 tablet by mouth daily.      Virtual Visit via telephone Note  I discussed the limitations, risks, security and privacy concerns of performing an evaluation and management service by telephone and the availability of in person appointments. I also discussed with the patient that there may be  a patient responsible charge related to this service. The patient expressed understanding and agreed to proceed. Pt. Is at home. Dr. Livia Snellen is in his office.  Follow Up Instructions:   I discussed the assessment and treatment plan with the patient. The patient was provided an opportunity to ask questions and all were answered. The patient agreed with the plan and demonstrated an understanding of the instructions.   The patient was advised to call back or seek an in-person evaluation if the symptoms worsen or if the condition fails to improve as anticipated.  Total minutes including chart review and phone contact time: 20   Follow-up: Return if symptoms worsen or fail to improve.  Claretta Fraise, M.D.

## 2019-06-20 DIAGNOSIS — M9905 Segmental and somatic dysfunction of pelvic region: Secondary | ICD-10-CM | POA: Diagnosis not present

## 2019-06-20 DIAGNOSIS — M9903 Segmental and somatic dysfunction of lumbar region: Secondary | ICD-10-CM | POA: Diagnosis not present

## 2019-06-20 DIAGNOSIS — M5137 Other intervertebral disc degeneration, lumbosacral region: Secondary | ICD-10-CM | POA: Diagnosis not present

## 2019-06-20 DIAGNOSIS — M9904 Segmental and somatic dysfunction of sacral region: Secondary | ICD-10-CM | POA: Diagnosis not present

## 2019-06-22 DIAGNOSIS — M9904 Segmental and somatic dysfunction of sacral region: Secondary | ICD-10-CM | POA: Diagnosis not present

## 2019-06-22 DIAGNOSIS — M9903 Segmental and somatic dysfunction of lumbar region: Secondary | ICD-10-CM | POA: Diagnosis not present

## 2019-06-22 DIAGNOSIS — M5137 Other intervertebral disc degeneration, lumbosacral region: Secondary | ICD-10-CM | POA: Diagnosis not present

## 2019-06-22 DIAGNOSIS — M9905 Segmental and somatic dysfunction of pelvic region: Secondary | ICD-10-CM | POA: Diagnosis not present

## 2019-06-23 DIAGNOSIS — M5137 Other intervertebral disc degeneration, lumbosacral region: Secondary | ICD-10-CM | POA: Diagnosis not present

## 2019-06-23 DIAGNOSIS — M9904 Segmental and somatic dysfunction of sacral region: Secondary | ICD-10-CM | POA: Diagnosis not present

## 2019-06-23 DIAGNOSIS — M9903 Segmental and somatic dysfunction of lumbar region: Secondary | ICD-10-CM | POA: Diagnosis not present

## 2019-06-23 DIAGNOSIS — M9905 Segmental and somatic dysfunction of pelvic region: Secondary | ICD-10-CM | POA: Diagnosis not present

## 2019-06-27 ENCOUNTER — Other Ambulatory Visit: Payer: Self-pay

## 2019-06-27 ENCOUNTER — Encounter: Payer: Self-pay | Admitting: Physician Assistant

## 2019-06-27 ENCOUNTER — Ambulatory Visit (INDEPENDENT_AMBULATORY_CARE_PROVIDER_SITE_OTHER): Payer: PPO | Admitting: Physician Assistant

## 2019-06-27 VITALS — BP 124/80 | HR 91 | Temp 97.8°F | Ht 65.0 in | Wt 260.8 lb

## 2019-06-27 DIAGNOSIS — M79671 Pain in right foot: Secondary | ICD-10-CM | POA: Diagnosis not present

## 2019-06-27 DIAGNOSIS — I10 Essential (primary) hypertension: Secondary | ICD-10-CM

## 2019-06-27 DIAGNOSIS — G8929 Other chronic pain: Secondary | ICD-10-CM | POA: Diagnosis not present

## 2019-06-27 DIAGNOSIS — M9261 Juvenile osteochondrosis of tarsus, right ankle: Secondary | ICD-10-CM | POA: Diagnosis not present

## 2019-06-27 DIAGNOSIS — M9903 Segmental and somatic dysfunction of lumbar region: Secondary | ICD-10-CM | POA: Diagnosis not present

## 2019-06-27 DIAGNOSIS — R600 Localized edema: Secondary | ICD-10-CM

## 2019-06-27 DIAGNOSIS — M9905 Segmental and somatic dysfunction of pelvic region: Secondary | ICD-10-CM | POA: Diagnosis not present

## 2019-06-27 DIAGNOSIS — M5137 Other intervertebral disc degeneration, lumbosacral region: Secondary | ICD-10-CM | POA: Diagnosis not present

## 2019-06-27 DIAGNOSIS — M9904 Segmental and somatic dysfunction of sacral region: Secondary | ICD-10-CM | POA: Diagnosis not present

## 2019-06-27 MED ORDER — HYDROCHLOROTHIAZIDE 25 MG PO TABS: 25.0000 mg | ORAL_TABLET | Freq: Every day | ORAL | 1 refills | Status: DC

## 2019-06-27 NOTE — Patient Instructions (Signed)
Edema  Edema is when you have too much fluid in your body or under your skin. Edema may make your legs, feet, and ankles swell up. Swelling is also common in looser tissues, like around your eyes. This is a common condition. It gets more common as you get older. There are many possible causes of edema. Eating too much salt (sodium) and being on your feet or sitting for a long time can cause edema in your legs, feet, and ankles. Hot weather may make edema worse. Edema is usually painless. Your skin may look swollen or shiny. Follow these instructions at home:  Keep the swollen body part raised (elevated) above the level of your heart when you are sitting or lying down.  Do not sit still or stand for a long time.  Do not wear tight clothes. Do not wear garters on your upper legs.  Exercise your legs. This can help the swelling go down.  Wear elastic bandages or support stockings as told by your doctor.  Eat a low-salt (low-sodium) diet to reduce fluid as told by your doctor.  Depending on the cause of your swelling, you may need to limit how much fluid you drink (fluid restriction).  Take over-the-counter and prescription medicines only as told by your doctor. Contact a doctor if:  Treatment is not working.  You have heart, liver, or kidney disease and have symptoms of edema.  You have sudden and unexplained weight gain. Get help right away if:  You have shortness of breath or chest pain.  You cannot breathe when you lie down.  You have pain, redness, or warmth in the swollen areas.  You have heart, liver, or kidney disease and get edema all of a sudden.  You have a fever and your symptoms get worse all of a sudden. Summary  Edema is when you have too much fluid in your body or under your skin.  Edema may make your legs, feet, and ankles swell up. Swelling is also common in looser tissues, like around your eyes.  Raise (elevate) the swollen body part above the level of your  heart when you are sitting or lying down.  Follow your doctor's instructions about diet and how much fluid you can drink (fluid restriction). This information is not intended to replace advice given to you by your health care provider. Make sure you discuss any questions you have with your health care provider. Document Revised: 06/05/2017 Document Reviewed: 06/20/2016 Elsevier Patient Education  2020 [employer]  

## 2019-06-27 NOTE — Progress Notes (Signed)
Acute Office Visit  Subjective:    Patient ID: Lonnie Hurley, male    DOB: [birth year], 79 y.o.   MRN: [id number]  Chief Complaint  Patient presents with  . Foot Pain    bilateral   . Edema    Foot Pain This is a chronic problem. The current episode started more than 1 year ago. The problem occurs daily. The problem has been waxing and waning. He has tried acetaminophen, immobilization, position changes and NSAIDs for the symptoms. The treatment provided no relief.  Hypertension This is a chronic problem. The problem is controlled. There are no associated agents to hypertension. The current treatment provides significant improvement. There are no compliance problems.    Edema in right lower foot and lower leg.  He has tried to keep it up but it is hard sometimes to do.  There is more pain that has going to be in the ankle.  Sometimes there is even leg pain and the tingling from the swelling.  He has not had any significant blueness or white or temperature change.  Past Medical History:  Diagnosis Date  . BPH (benign prostatic hypertrophy)    Dr. Rosana Hoes / Dr. Joelyn Oms  - Urologist   . Cancer [facility])    lip-mole surgery  . Cataract    bilateral  . Colon polyps   . Constipation    occasional - miralax prn  . DDD (degenerative disc disease)   . Hearing loss    left ear, no hearing aids  . Hypercholesteremia   . Hyperlipidemia   . Hypertension   . Leukoplakia   . Microscopic hematuria    negative work up with Urology in the past.   . OAB (overactive bladder)    with past percutaneous tibial nerve stimulation therapy  . Thrombocytopenia ([place])   . Tinnitus   . Tobacco abuse   . Vitamin B 12 deficiency   . Vitamin D deficiency     Past Surgical History:  Procedure Laterality Date  . CATARACT EXTRACTION  2007   right eye  . CATARACT EXTRACTION Left 03/21/15  . COLONOSCOPY  02/28/2014  . HERNIA REPAIR  1980  . KNEE SURGERY  02-28-2008   replaced inside right knee  . Left  Knee Repair  11-10-2006  . NOSE SURGERY    . POLYPECTOMY     colon polyps  . SKIN BIOPSY  10-08-2006   left ear    Family History  Problem Relation Age of Onset  . Heart failure Father   . Glaucoma Mother   . Osteoporosis Brother   . Heart disease Brother   . Hypertension Brother   . Hyperlipidemia Brother   . Diabetes Brother   . Colon cancer Neg Hx   . Stomach cancer Neg Hx   . Rectal cancer Neg Hx     Social History   Socioeconomic History  . Marital status: Single    Spouse name: Not on file  . Number of children: 0  . Years of education: Not on file  . Highest education level: Not on file  Occupational History    Comment: retired Immunologist  Tobacco Use  . Smoking status: Former Smoker    Packs/day: 1.00    Years: 50.00    Pack years: 50.00    Types: Cigarettes    Quit date: 08/14/2009    Years since quitting: 9.8  . Smokeless tobacco: Never Used  Substance and Sexual Activity  . Alcohol use: No  .  Drug use: No  . Sexual activity: Not Currently  Other Topics Concern  . Not on file  Social History Narrative  . Not on file   Social Determinants of Health   Financial Resource Strain:   . Difficulty of Paying Living Expenses: Not on file  Food Insecurity:   . Worried About Charity fundraiser in the Last Year: Not on file  . Ran Out of Food in the Last Year: Not on file  Transportation Needs: No Transportation Needs  . Lack of Transportation (Medical): No  . Lack of Transportation (Non-Medical): No  Physical Activity:   . Days of Exercise per Week: Not on file  . Minutes of Exercise per Session: Not on file  Stress:   . Feeling of Stress : Not on file  Social Connections:   . Frequency of Communication with Friends and Family: Not on file  . Frequency of Social Gatherings with Friends and Family: Not on file  . Attends Religious Services: Not on file  . Active Member of Clubs or Organizations: Not on file  . Attends Theatre manager Meetings: Not on file  . Marital Status: Not on file  Intimate Partner Violence: Not At Risk  . Fear of Current or Ex-Partner: No  . Emotionally Abused: No  . Physically Abused: No  . Sexually Abused: No    Outpatient Medications Prior to Visit  Medication Sig Dispense Refill  . acetaminophen (TYLENOL) 500 MG tablet Take 500 mg by mouth every 6 (six) hours as needed.    . Calcium Carb-Cholecalciferol (CALCIUM 600 + D PO) Take by mouth 2 (two) times daily.     . Cholecalciferol (VITAMIN D3) 2000 UNITS TABS Take 1 tablet by mouth daily.     Marland Kitchen ezetimibe (ZETIA) 10 MG tablet Take 1 tablet (10 mg total) by mouth daily. 90 tablet 3  . Glucosamine-Chondroit-Vit C-Mn (GLUCOSAMINE 1500 COMPLEX PO) Take by mouth 2 (two) times daily.     Marland Kitchen glucose blood (ONE TOUCH ULTRA TEST) test strip Use as instructed 50 each 11  . meloxicam (MOBIC) 7.5 MG tablet TAKE 1 TABLET BY MOUTH EVERY DAY (Patient taking differently: As needed) 30 tablet 2  . Multiple Vitamins-Minerals (MULTIVITAMIN WITH MINERALS) tablet Take 1 tablet by mouth daily.    Marland Kitchen nystatin-triamcinolone (MYCOLOG II) cream Apply 1 application topically 2 (two) times daily. 30 g 0  . oxybutynin (DITROPAN-XL) 10 MG 24 hr tablet Take 10 mg by mouth daily.    . polyethylene glycol powder (MIRALAX) powder Take 255 g by mouth as needed. 850 g 2  . ramipril (ALTACE) 10 MG capsule Take 1 capsule (10 mg total) by mouth daily. 90 capsule 3  . simvastatin (ZOCOR) 40 MG tablet Take 1 tablet (40 mg total) by mouth at bedtime. 90 tablet 3  . tamsulosin (FLOMAX) 0.4 MG CAPS capsule Take 1 capsule (0.4 mg total) by mouth at bedtime. 90 capsule 3  . hydrochlorothiazide (MICROZIDE) 12.5 MG capsule Take 1 capsule (12.5 mg total) by mouth daily. 90 capsule 3  . predniSONE (DELTASONE) 10 MG tablet Take 5 daily for 3 days followed by 4,3,2 and 1 for 3 days each. 45 tablet 0   No facility-administered medications prior to visit.    Allergies  Allergen  Reactions  . Penicillins Other (See Comments)    unknown  . Aspirin Other (See Comments)    Causes Blood in urine  . Crestor [Rosuvastatin Calcium] Other (See Comments)    Myalgia, "joint  problem"  . Gabapentin Other (See Comments)    Blurry vision   . Hydrocodone Other (See Comments)    Constipation    . Lyrica [Pregabalin]     Blurry vision     Review of Systems     Objective:    Physical Exam Vitals and nursing note reviewed.  Constitutional:      General: He is not in acute distress.    Appearance: He is well-developed.  HENT:     Head: Normocephalic and atraumatic.  Eyes:     Conjunctiva/sclera: Conjunctivae normal.     Pupils: Pupils are equal, round, and reactive to light.  Cardiovascular:     Rate and Rhythm: Normal rate and regular rhythm.     Pulses:          Dorsalis pedis pulses are 2+ on the right side.       Posterior tibial pulses are 2+ on the right side.     Heart sounds: Normal heart sounds.  Pulmonary:     Effort: Pulmonary effort is normal. No respiratory distress.     Breath sounds: Normal breath sounds.  Musculoskeletal:     Right foot: Normal range of motion. No deformity.       Feet:  Feet:     Right foot:     Skin integrity: Erythema and warmth present.     Toenail Condition: Right toenails are normal.  Skin:    General: Skin is warm and dry.  Psychiatric:        Behavior: Behavior normal.     BP 124/80   Pulse 91   Temp 97.8 F (36.6 C) (Temporal)   Ht 5\' 5"  (1.651 m)   Wt 260 lb 12.8 oz (118.3 kg)   SpO2 97%   BMI 43.40 kg/m  Wt Readings from Last 3 Encounters:  06/27/19 260 lb 12.8 oz (118.3 kg)  04/04/19 245 lb 6.4 oz (111.3 kg)  03/25/19 243 lb (110.2 kg)    There are no preventive care reminders to display for this patient.  There are no preventive care reminders to display for this patient.   Lab Results  Component Value Date   TSH 1.760 12/28/2017   Lab Results  Component Value Date   WBC 12.7 (H)  01/12/2019   HGB 15.4 01/12/2019   HCT 47.1 01/12/2019   MCV 89 01/12/2019   PLT 151 01/12/2019   Lab Results  Component Value Date   NA 142 04/04/2019   K 4.1 04/04/2019   CHLORIDE 105 09/01/2013   CO2 28 04/04/2019   GLUCOSE 92 04/04/2019   BUN 12 04/04/2019   CREATININE 0.96 04/04/2019   BILITOT 0.9 01/12/2019   ALKPHOS 53 01/12/2019   AST 19 01/12/2019   ALT 31 01/12/2019   PROT 5.9 (L) 01/12/2019   ALBUMIN 3.8 01/12/2019   CALCIUM 9.5 04/04/2019   ANIONGAP 9 09/01/2013   Lab Results  Component Value Date   CHOL 129 01/12/2019   Lab Results  Component Value Date   HDL 46 01/12/2019   Lab Results  Component Value Date   LDLCALC 54 01/12/2019   Lab Results  Component Value Date   TRIG 144 01/12/2019   Lab Results  Component Value Date   CHOLHDL 2.8 01/12/2019   Lab Results  Component Value Date   HGBA1C 6.3 06/22/2018       Assessment & Plan:   Problem List Items Addressed This Visit      Cardiovascular and Mediastinum  Hypertension   Relevant Medications   hydrochlorothiazide (HYDRODIURIL) 25 MG tablet     Musculoskeletal and Integument   Haglund's deformity of right heel     Other   Heel pain, chronic, right    Other Visit Diagnoses    Localized edema    -  Primary       Meds ordered this encounter  Medications  . hydrochlorothiazide (HYDRODIURIL) 25 MG tablet    Sig: Take 1 tablet (25 mg total) by mouth daily.    Dispense:  90 tablet    Refill:  1    Order Specific Question:   Supervising Provider    Answer:   Janora Norlander [id number]     Terald Sleeper, PA-C

## 2019-06-30 DIAGNOSIS — M9904 Segmental and somatic dysfunction of sacral region: Secondary | ICD-10-CM | POA: Diagnosis not present

## 2019-06-30 DIAGNOSIS — M5137 Other intervertebral disc degeneration, lumbosacral region: Secondary | ICD-10-CM | POA: Diagnosis not present

## 2019-06-30 DIAGNOSIS — M9905 Segmental and somatic dysfunction of pelvic region: Secondary | ICD-10-CM | POA: Diagnosis not present

## 2019-06-30 DIAGNOSIS — M9903 Segmental and somatic dysfunction of lumbar region: Secondary | ICD-10-CM | POA: Diagnosis not present

## 2019-07-04 DIAGNOSIS — M9905 Segmental and somatic dysfunction of pelvic region: Secondary | ICD-10-CM | POA: Diagnosis not present

## 2019-07-04 DIAGNOSIS — M5137 Other intervertebral disc degeneration, lumbosacral region: Secondary | ICD-10-CM | POA: Diagnosis not present

## 2019-07-04 DIAGNOSIS — M9903 Segmental and somatic dysfunction of lumbar region: Secondary | ICD-10-CM | POA: Diagnosis not present

## 2019-07-04 DIAGNOSIS — M9904 Segmental and somatic dysfunction of sacral region: Secondary | ICD-10-CM | POA: Diagnosis not present

## 2019-07-07 DIAGNOSIS — M9903 Segmental and somatic dysfunction of lumbar region: Secondary | ICD-10-CM | POA: Diagnosis not present

## 2019-07-07 DIAGNOSIS — M5137 Other intervertebral disc degeneration, lumbosacral region: Secondary | ICD-10-CM | POA: Diagnosis not present

## 2019-07-07 DIAGNOSIS — M9904 Segmental and somatic dysfunction of sacral region: Secondary | ICD-10-CM | POA: Diagnosis not present

## 2019-07-07 DIAGNOSIS — M9905 Segmental and somatic dysfunction of pelvic region: Secondary | ICD-10-CM | POA: Diagnosis not present

## 2019-07-11 ENCOUNTER — Encounter: Payer: Self-pay | Admitting: Physician Assistant

## 2019-07-11 ENCOUNTER — Ambulatory Visit (INDEPENDENT_AMBULATORY_CARE_PROVIDER_SITE_OTHER): Payer: PPO | Admitting: Physician Assistant

## 2019-07-11 VITALS — BP 134/72 | HR 74 | Temp 98.0°F | Ht 65.0 in | Wt 263.6 lb

## 2019-07-11 DIAGNOSIS — I10 Essential (primary) hypertension: Secondary | ICD-10-CM

## 2019-07-11 DIAGNOSIS — E785 Hyperlipidemia, unspecified: Secondary | ICD-10-CM

## 2019-07-11 DIAGNOSIS — M79672 Pain in left foot: Secondary | ICD-10-CM | POA: Diagnosis not present

## 2019-07-11 DIAGNOSIS — M9261 Juvenile osteochondrosis of tarsus, right ankle: Secondary | ICD-10-CM

## 2019-07-11 DIAGNOSIS — G8929 Other chronic pain: Secondary | ICD-10-CM | POA: Diagnosis not present

## 2019-07-11 DIAGNOSIS — M479 Spondylosis, unspecified: Secondary | ICD-10-CM

## 2019-07-11 DIAGNOSIS — D696 Thrombocytopenia, unspecified: Secondary | ICD-10-CM

## 2019-07-11 DIAGNOSIS — M79671 Pain in right foot: Secondary | ICD-10-CM

## 2019-07-11 DIAGNOSIS — N400 Enlarged prostate without lower urinary tract symptoms: Secondary | ICD-10-CM | POA: Diagnosis not present

## 2019-07-11 DIAGNOSIS — M4716 Other spondylosis with myelopathy, lumbar region: Secondary | ICD-10-CM

## 2019-07-11 DIAGNOSIS — E538 Deficiency of other specified B group vitamins: Secondary | ICD-10-CM

## 2019-07-11 MED ORDER — GLUCOSE BLOOD VI STRP: ORAL_STRIP | 11 refills | Status: DC

## 2019-07-11 MED ORDER — EZETIMIBE 10 MG PO TABS: 10.0000 mg | ORAL_TABLET | Freq: Every day | ORAL | 3 refills | Status: DC

## 2019-07-11 MED ORDER — POLYETHYLENE GLYCOL 3350 17 GM/SCOOP PO POWD: 1.0000 | ORAL | 2 refills | Status: DC | PRN

## 2019-07-11 NOTE — Patient Instructions (Signed)
Degenerative Disk Disease  Degenerative disk disease is a condition caused by changes that occur in the spinal disks as a person ages. Spinal disks are soft and compressible disks located between the bones of your spine (vertebrae). These disks act like shock absorbers. Degenerative disk disease can affect the whole spine. However, the neck and lower back are most often affected. Many changes can occur in the spinal disks with aging, such as:  The spinal disks may dry and shrink.  Small tears may occur in the tough, outer covering of the disk (annulus).  The disk space may become smaller due to loss of water.  Abnormal growths in the bone (spurs) may occur. This can put pressure on the nerve roots exiting the spinal canal, causing pain.  The spinal canal may become narrowed. What are the causes? This condition may be caused by:  Normal degeneration with age.  Injuries.  Certain activities and sports that cause damage. What increases the risk? The following factors may make you more likely to develop this condition:  Being overweight.  Having a family history of degenerative disk disease.  Smoking.  Sudden injury.  Doing work that requires heavy lifting. What are the signs or symptoms? Symptoms of this condition include:  Pain that varies in intensity. Some people have no pain, while others have severe pain. The location of the pain depends on the part of your backbone that is affected. You may have: ? Pain in your neck or arm if a disk in your neck area is affected. ? Pain in your back, buttocks, or legs if a disk in your lower back is affected.  Pain that becomes worse while bending or reaching up, or with twisting movements.  Pain that may start gradually and then get worse as time passes. It may also start after a major or minor injury.  Numbness or tingling in the arms or legs. How is this diagnosed? This condition may be diagnosed based on:  Your symptoms and  medical history.  A physical exam.  Imaging tests, including: ? An X-ray of the spine. ? MRI. How is this treated? This condition may be treated with:  Medicines.  Rehabilitation exercises. These activities aim to strengthen muscles in your back and abdomen to better support your spine. If treatments do not help to relieve your symptoms or you have severe pain, you may need surgery. Follow these instructions at home: Medicines  Take over-the-counter and prescription medicines only as told by your health care provider.  Do not drive or use heavy machinery while taking prescription pain medicine.  If you are taking prescription pain medicine, take actions to prevent or treat constipation. Your health care provider may recommend that you: ? Drink enough fluid to keep your urine pale yellow. ? Eat foods that are high in fiber, such as fresh fruits and vegetables, whole grains, and beans. ? Limit foods that are high in fat and processed sugars, such as fried or sweet foods. ? Take an over-the-counter or prescription medicine for constipation. Activity  Rest as told by your health care provider.  Ask your health care provider what activities are safe for you. Return to your normal activities as directed.  Avoid sitting for a long time without moving. Get up to take short walks every 1-2 hours. This is important to improve blood flow and breathing. Ask for help if you feel weak or unsteady.  Perform relaxation exercises as told by your health care provider.  Maintain good posture.    Do not lift anything that is heavier than 10 lb (4.5 kg), or the limit that you are told, until your health care provider says that it is safe.  Follow proper lifting and walking techniques as told by your health care provider. Managing pain, stiffness, and swelling   If directed, put ice on the painful area. Icing can help to relieve pain. ? Put ice in a plastic bag. ? Place a towel between your  skin and the bag. ? Leave the ice on for 20 minutes, 2-3 times a day.  If directed, apply heat to the painful area as often as told by your health care provider. Heat can reduce the stiffness of your muscles. Use the heat source that your health care provider recommends, such as a moist heat pack or a heating pad. ? Place a towel between your skin and the heat source. ? Leave the heat on for 20-30 minutes. ? Remove the heat if your skin turns bright red. This is especially important if you are unable to feel pain, heat, or cold. You may have a greater risk of getting burned. General instructions  Change your sitting, standing, and sleeping habits as told by your health care provider.  Avoid sitting in the same position for long periods of time. Change positions frequently.  Lose weight or maintain a healthy weight as told by your health care provider.  Do not use any products that contain nicotine or tobacco, such as cigarettes and e-cigarettes. If you need help quitting, ask your health care provider.  Wear supportive footwear.  Keep all follow-up visits as told by your health care provider. This is important. This may include visits for physical therapy. Contact a health care provider if you:  Have pain that does not go away within 1-4 weeks.  Lose your appetite.  Lose weight without trying. Get help right away if you:  Have severe pain.  Notice weakness in your arms, hands, or legs.  Begin to lose control of your bladder or bowel movements.  Have fevers or night sweats. Summary  Degenerative disk disease is a condition caused by changes that occur in the spinal disks as a person ages.  Degenerative disk disease can affect the whole spine. However, the neck and lower back are most often affected.  Take over-the-counter and prescription medicines only as told by your health care provider. This information is not intended to replace advice given to you by your health care  provider. Make sure you discuss any questions you have with your health care provider. Document Revised: 05/28/2017 Document Reviewed: 05/28/2017 Elsevier Patient Education  2020 [employer]  

## 2019-07-12 ENCOUNTER — Other Ambulatory Visit: Payer: Self-pay | Admitting: Physician Assistant

## 2019-07-12 ENCOUNTER — Telehealth: Payer: Self-pay | Admitting: Physician Assistant

## 2019-07-12 MED ORDER — POLYETHYLENE GLYCOL 3350 17 GM/SCOOP PO POWD: 17.0000 g | ORAL | 2 refills | Status: DC | PRN

## 2019-07-12 NOTE — Telephone Encounter (Signed)
Speaking with Lattie Haw from pharmacy who is calling to get clarification on the Mirilax powder Rx (dosage/times per day).

## 2019-07-12 NOTE — Telephone Encounter (Signed)
Corrected script sent for 17 g dose as needed for constipation

## 2019-07-13 ENCOUNTER — Telehealth: Payer: Self-pay | Admitting: Physician Assistant

## 2019-07-13 ENCOUNTER — Other Ambulatory Visit: Payer: Self-pay | Admitting: Physician Assistant

## 2019-07-13 MED ORDER — POLYETHYLENE GLYCOL 3350 17 GM/SCOOP PO POWD: 17.0000 g | ORAL | 2 refills | Status: AC | PRN

## 2019-07-13 NOTE — Progress Notes (Signed)
Subjective:    Patient ID: Lonnie Hurley, male    DOB: 27-Jul-1940, 79 y.o.   MRN: [id number]  Chief Complaint  Patient presents with  . Medical Management of Chronic Issues   This patient comes in for recheck on chronic medical conditions.  He does have multiple cold medical conditions.  He does have BPH, thrombocytopenia, vitamin B12 deficiency.  Labs will be performed for those today.  His other conditions do include chronic back pain due to arthritis, hypertension, foot pain due to deformity, hyperlipidemia.  Back Pain This is a chronic problem. The current episode started more than 1 year ago. The problem has been gradually worsening since onset. The pain is present in the sacro-iliac and lumbar spine. The quality of the pain is described as aching and shooting. The pain radiates to the left knee, left foot, left thigh, right foot, right thigh and right knee. The pain is at a severity of 6/10. The pain is the same all the time. Pertinent negatives include no abdominal pain, chest pain, headaches, numbness or weakness.  Hypertension This is a chronic problem. The current episode started more than 1 year ago. The problem is controlled. Pertinent negatives include no chest pain, headaches, palpitations or shortness of breath. Past treatments include ACE inhibitors. The current treatment provides significant improvement.  Hyperlipidemia This is a chronic problem. The current episode started more than 1 year ago. The problem is controlled. Pertinent negatives include no chest pain or shortness of breath. Current antihyperlipidemic treatment includes statins and bile acid squestrants. The current treatment provides significant improvement of lipids. Risk factors for coronary artery disease include hypertension, dyslipidemia, male sex and obesity.  Arthritis Presents for follow-up visit. He complains of pain, stiffness and joint swelling. The symptoms have been worsening. Affected locations  include the right foot. His pain is at a severity of 5/10. Pertinent negatives include no diarrhea, fatigue or rash.     Past Medical History:  Diagnosis Date  . BPH (benign prostatic hypertrophy)    Dr. Rosana Hoes / Dr. Joelyn Oms  - Urologist   . Cancer [facility])    lip-mole surgery  . Cataract    bilateral  . Colon polyps   . Constipation    occasional - miralax prn  . DDD (degenerative disc disease)   . Hearing loss    left ear, no hearing aids  . Hypercholesteremia   . Hyperlipidemia   . Hypertension   . Leukoplakia   . Microscopic hematuria    negative work up with Urology in the past.   . OAB (overactive bladder)    with past percutaneous tibial nerve stimulation therapy  . Thrombocytopenia ([place])   . Tinnitus   . Tobacco abuse   . Vitamin B 12 deficiency   . Vitamin D deficiency     Past Surgical History:  Procedure Laterality Date  . CATARACT EXTRACTION  2007   right eye  . CATARACT EXTRACTION Left 03/21/15  . COLONOSCOPY  02/28/2014  . HERNIA REPAIR  1980  . KNEE SURGERY  02-28-2008   replaced inside right knee  . Left Knee Repair  11-10-2006  . NOSE SURGERY    . POLYPECTOMY     colon polyps  . SKIN BIOPSY  10-08-2006   left ear    Family History  Problem Relation Age of Onset  . Heart failure Father   . Glaucoma Mother   . Osteoporosis Brother   . Heart disease Brother   . Hypertension Brother   .  Hyperlipidemia Brother   . Diabetes Brother   . Colon cancer Neg Hx   . Stomach cancer Neg Hx   . Rectal cancer Neg Hx     Social History   Socioeconomic History  . Marital status: Single    Spouse name: Not on file  . Number of children: 0  . Years of education: Not on file  . Highest education level: Not on file  Occupational History    Comment: retired Immunologist  Tobacco Use  . Smoking status: Former Smoker    Packs/day: 1.00    Years: 50.00    Pack years: 50.00    Types: Cigarettes    Quit date: 08/14/2009    Years since quitting:  9.9  . Smokeless tobacco: Never Used  Substance and Sexual Activity  . Alcohol use: No  . Drug use: No  . Sexual activity: Not Currently  Other Topics Concern  . Not on file  Social History Narrative  . Not on file   Social Determinants of Health   Financial Resource Strain:   . Difficulty of Paying Living Expenses: Not on file  Food Insecurity:   . Worried About Charity fundraiser in the Last Year: Not on file  . Ran Out of Food in the Last Year: Not on file  Transportation Needs: No Transportation Needs  . Lack of Transportation (Medical): No  . Lack of Transportation (Non-Medical): No  Physical Activity:   . Days of Exercise per Week: Not on file  . Minutes of Exercise per Session: Not on file  Stress:   . Feeling of Stress : Not on file  Social Connections:   . Frequency of Communication with Friends and Family: Not on file  . Frequency of Social Gatherings with Friends and Family: Not on file  . Attends Religious Services: Not on file  . Active Member of Clubs or Organizations: Not on file  . Attends Archivist Meetings: Not on file  . Marital Status: Not on file  Intimate Partner Violence: Not At Risk  . Fear of Current or Ex-Partner: No  . Emotionally Abused: No  . Physically Abused: No  . Sexually Abused: No    Outpatient Medications Prior to Visit  Medication Sig Dispense Refill  . acetaminophen (TYLENOL) 500 MG tablet Take 500 mg by mouth every 6 (six) hours as needed.    . Calcium Carb-Cholecalciferol (CALCIUM 600 + D PO) Take by mouth 2 (two) times daily.     . Cholecalciferol (VITAMIN D3) 2000 UNITS TABS Take 1 tablet by mouth daily.     . Glucosamine-Chondroit-Vit C-Mn (GLUCOSAMINE 1500 COMPLEX PO) Take by mouth 2 (two) times daily.     . hydrochlorothiazide (HYDRODIURIL) 25 MG tablet Take 1 tablet (25 mg total) by mouth daily. 90 tablet 1  . meloxicam (MOBIC) 7.5 MG tablet TAKE 1 TABLET BY MOUTH EVERY DAY (Patient taking differently: As needed)  30 tablet 2  . Multiple Vitamins-Minerals (MULTIVITAMIN WITH MINERALS) tablet Take 1 tablet by mouth daily.    Marland Kitchen nystatin-triamcinolone (MYCOLOG II) cream Apply 1 application topically 2 (two) times daily. 30 g 0  . oxybutynin (DITROPAN-XL) 10 MG 24 hr tablet Take 10 mg by mouth daily.    . ramipril (ALTACE) 10 MG capsule Take 1 capsule (10 mg total) by mouth daily. 90 capsule 3  . simvastatin (ZOCOR) 40 MG tablet Take 1 tablet (40 mg total) by mouth at bedtime. 90 tablet 3  . tamsulosin (FLOMAX)  0.4 MG CAPS capsule Take 1 capsule (0.4 mg total) by mouth at bedtime. 90 capsule 3  . ezetimibe (ZETIA) 10 MG tablet Take 1 tablet (10 mg total) by mouth daily. 90 tablet 3  . glucose blood (ONE TOUCH ULTRA TEST) test strip Use as instructed 50 each 11  . polyethylene glycol powder (MIRALAX) powder Take 255 g by mouth as needed. 850 g 2   No facility-administered medications prior to visit.    Allergies  Allergen Reactions  . Penicillins Other (See Comments)    unknown  . Aspirin Other (See Comments)    Causes Blood in urine  . Crestor [Rosuvastatin Calcium] Other (See Comments)    Myalgia, "joint problem"  . Gabapentin Other (See Comments)    Blurry vision   . Hydrocodone Other (See Comments)    Constipation    . Lyrica [Pregabalin]     Blurry vision     Review of Systems  Constitutional: Negative.  Negative for appetite change and fatigue.  Eyes: Negative for pain and visual disturbance.  Respiratory: Negative.  Negative for cough, chest tightness, shortness of breath and wheezing.   Cardiovascular: Negative.  Negative for chest pain, palpitations and leg swelling.  Gastrointestinal: Negative.  Negative for abdominal pain, diarrhea, nausea and vomiting.  Genitourinary: Negative.   Musculoskeletal: Positive for arthritis, back pain, joint swelling and stiffness.  Skin: Negative.  Negative for color change and rash.  Neurological: Negative.  Negative for weakness, numbness and  headaches.  Psychiatric/Behavioral: Negative.        Objective:    Physical Exam Vitals and nursing note reviewed.  Constitutional:      General: He is not in acute distress.    Appearance: He is well-developed.  HENT:     Head: Normocephalic and atraumatic.  Eyes:     Conjunctiva/sclera: Conjunctivae normal.     Pupils: Pupils are equal, round, and reactive to light.  Cardiovascular:     Rate and Rhythm: Normal rate and regular rhythm.     Heart sounds: Normal heart sounds.  Pulmonary:     Effort: Pulmonary effort is normal. No respiratory distress.     Breath sounds: Normal breath sounds.  Musculoskeletal:     Lumbar back: Spasms and tenderness present. Decreased range of motion.       Back:     Right foot: Decreased range of motion. Swelling and tenderness present.  Skin:    General: Skin is warm and dry.  Psychiatric:        Behavior: Behavior normal.     BP 134/72   Pulse 74   Temp 98 F (36.7 C) (Temporal)   Ht 5' 5"  (1.651 m)   Wt 263 lb 9.6 oz (119.6 kg)   SpO2 96%   BMI 43.87 kg/m  Wt Readings from Last 3 Encounters:  07/11/19 263 lb 9.6 oz (119.6 kg)  06/27/19 260 lb 12.8 oz (118.3 kg)  04/04/19 245 lb 6.4 oz (111.3 kg)    There are no preventive care reminders to display for this patient.  There are no preventive care reminders to display for this patient.   Lab Results  Component Value Date   TSH 1.760 12/28/2017   Lab Results  Component Value Date   WBC 12.7 (H) 01/12/2019   HGB 15.4 01/12/2019   HCT 47.1 01/12/2019   MCV 89 01/12/2019   PLT 151 01/12/2019   Lab Results  Component Value Date   NA 142 04/04/2019   K 4.1 04/04/2019  CHLORIDE 105 09/01/2013   CO2 28 04/04/2019   GLUCOSE 92 04/04/2019   BUN 12 04/04/2019   CREATININE 0.96 04/04/2019   BILITOT 0.9 01/12/2019   ALKPHOS 53 01/12/2019   AST 19 01/12/2019   ALT 31 01/12/2019   PROT 5.9 (L) 01/12/2019   ALBUMIN 3.8 01/12/2019   CALCIUM 9.5 04/04/2019   ANIONGAP 9  09/01/2013   Lab Results  Component Value Date   CHOL 129 01/12/2019   Lab Results  Component Value Date   HDL 46 01/12/2019   Lab Results  Component Value Date   LDLCALC 54 01/12/2019   Lab Results  Component Value Date   TRIG 144 01/12/2019   Lab Results  Component Value Date   CHOLHDL 2.8 01/12/2019   Lab Results  Component Value Date   HGBA1C 6.3 06/22/2018       Assessment & Plan:   Problem List Items Addressed This Visit      Cardiovascular and Mediastinum   Hypertension   Relevant Medications   ezetimibe (ZETIA) 10 MG tablet   Other Relevant Orders   CBC with Differential/Platelet   CMP14+EGFR   Lipid panel   TSH     Nervous and Auditory   Lumbar spondylosis with myelopathy     Musculoskeletal and Integument   Arthritis of back   Haglund's deformity of right heel   Relevant Orders   Ambulatory referral to Podiatry     Genitourinary   BPH (benign prostatic hyperplasia)   Relevant Orders   PSA     Other   Thrombocytopenia (HCC)   Hyperlipidemia   Relevant Medications   ezetimibe (ZETIA) 10 MG tablet   Vitamin B 12 deficiency   Relevant Orders   CBC with Differential/Platelet   CMP14+EGFR   Lipid panel   TSH   Heel pain, chronic, right - Primary   Relevant Orders   Ambulatory referral to Podiatry   Foot pain, bilateral   Relevant Orders   Ambulatory referral to Podiatry       Meds ordered this encounter  Medications  . ezetimibe (ZETIA) 10 MG tablet    Sig: Take 1 tablet (10 mg total) by mouth daily.    Dispense:  90 tablet    Refill:  3    Order Specific Question:   Supervising Provider    Answer:   Janora Norlander [[id number]]  . DISCONTD: polyethylene glycol powder (MIRALAX) 17 GM/SCOOP powder    Sig: Take 255 g by mouth as needed.    Dispense:  850 g    Refill:  2    Order Specific Question:   Supervising Provider    Answer:   Janora Norlander [[id number]]  . glucose blood (ONE TOUCH ULTRA TEST) test strip    Sig:  Use as instructed    Dispense:  50 each    Refill:  11    ONE TOUCH ULTRA test strips    Order Specific Question:   Supervising Provider    Answer:   Janora Norlander [[id number]]     Terald Sleeper, PA-C   Terald Sleeper PA-C [place] [address]  [place], [place] [address] [phone]

## 2019-07-13 NOTE — Telephone Encounter (Signed)
Corrected quantity sent

## 2019-07-14 ENCOUNTER — Other Ambulatory Visit: Payer: Self-pay

## 2019-07-14 ENCOUNTER — Other Ambulatory Visit: Payer: PPO

## 2019-07-14 DIAGNOSIS — E538 Deficiency of other specified B group vitamins: Secondary | ICD-10-CM

## 2019-07-14 DIAGNOSIS — N400 Enlarged prostate without lower urinary tract symptoms: Secondary | ICD-10-CM

## 2019-07-14 DIAGNOSIS — I10 Essential (primary) hypertension: Secondary | ICD-10-CM

## 2019-07-15 LAB — CMP14+EGFR
ALT: 29 IU/L (ref 0–44)
AST: 27 IU/L (ref 0–40)
Albumin/Globulin Ratio: 1.9 (ref 1.2–2.2)
Albumin: 4.1 g/dL (ref 3.7–4.7)
Alkaline Phosphatase: 62 IU/L (ref 39–117)
BUN/Creatinine Ratio: 11 (ref 10–24)
BUN: 11 mg/dL (ref 8–27)
Bilirubin Total: 0.7 mg/dL (ref 0.0–1.2)
CO2: 32 mmol/L — ABNORMAL HIGH (ref 20–29)
Calcium: 9.8 mg/dL (ref 8.6–10.2)
Chloride: 100 mmol/L (ref 96–106)
Creatinine, Ser: 1 mg/dL (ref 0.76–1.27)
GFR calc Af Amer: 84 mL/min/{1.73_m2} (ref >59)
GFR calc non Af Amer: 73 mL/min/{1.73_m2} (ref >59)
Globulin, Total: 2.2 g/dL (ref 1.5–4.5)
Glucose: 114 mg/dL — ABNORMAL HIGH (ref 65–99)
Potassium: 4.1 mmol/L (ref 3.5–5.2)
Sodium: 143 mmol/L (ref 134–144)
Total Protein: 6.3 g/dL (ref 6.0–8.5)

## 2019-07-15 LAB — CBC WITH DIFFERENTIAL/PLATELET
Basophils Absolute: 0.1 10*3/uL (ref 0.0–0.2)
Basos: 1 %
EOS (ABSOLUTE): 0.2 10*3/uL (ref 0.0–0.4)
Eos: 2 %
Hematocrit: 45.7 % (ref 37.5–51.0)
Hemoglobin: 15.5 g/dL (ref 13.0–17.7)
Immature Grans (Abs): 0.1 10*3/uL (ref 0.0–0.1)
Immature Granulocytes: 1 %
Lymphocytes Absolute: 3.3 10*3/uL — ABNORMAL HIGH (ref 0.7–3.1)
Lymphs: 34 %
MCH: 29.4 pg (ref 26.6–33.0)
MCHC: 33.9 g/dL (ref 31.5–35.7)
MCV: 87 fL (ref 79–97)
Monocytes Absolute: 1.2 10*3/uL — ABNORMAL HIGH (ref 0.1–0.9)
Monocytes: 12 %
Neutrophils Absolute: 5 10*3/uL (ref 1.4–7.0)
Neutrophils: 50 %
Platelets: 179 10*3/uL (ref 150–450)
RBC: 5.28 x10E6/uL (ref 4.14–5.80)
RDW: 12.6 % (ref 11.6–15.4)
WBC: 9.9 10*3/uL (ref 3.4–10.8)

## 2019-07-15 LAB — TSH: TSH: 1.92 u[IU]/mL (ref 0.450–4.500)

## 2019-07-15 LAB — PSA: Prostate Specific Ag, Serum: 1.2 ng/mL (ref 0.0–4.0)

## 2019-07-15 LAB — LIPID PANEL
Chol/HDL Ratio: 3.3 ratio (ref 0.0–5.0)
Cholesterol, Total: 124 mg/dL (ref 100–199)
HDL: 38 mg/dL — ABNORMAL LOW (ref >39)
LDL Chol Calc (NIH): 58 mg/dL (ref 0–99)
Triglycerides: 163 mg/dL — ABNORMAL HIGH (ref 0–149)
VLDL Cholesterol Cal: 28 mg/dL (ref 5–40)

## 2019-07-18 ENCOUNTER — Ambulatory Visit (INDEPENDENT_AMBULATORY_CARE_PROVIDER_SITE_OTHER): Payer: PPO

## 2019-07-18 ENCOUNTER — Other Ambulatory Visit: Payer: Self-pay

## 2019-07-18 ENCOUNTER — Ambulatory Visit: Payer: PPO | Admitting: Podiatry

## 2019-07-18 DIAGNOSIS — G5791 Unspecified mononeuropathy of right lower limb: Secondary | ICD-10-CM

## 2019-07-18 DIAGNOSIS — M792 Neuralgia and neuritis, unspecified: Secondary | ICD-10-CM | POA: Diagnosis not present

## 2019-07-18 DIAGNOSIS — M79672 Pain in left foot: Secondary | ICD-10-CM | POA: Diagnosis not present

## 2019-07-18 DIAGNOSIS — M79671 Pain in right foot: Secondary | ICD-10-CM

## 2019-07-22 ENCOUNTER — Other Ambulatory Visit: Payer: Self-pay | Admitting: Podiatry

## 2019-07-22 DIAGNOSIS — M792 Neuralgia and neuritis, unspecified: Secondary | ICD-10-CM

## 2019-07-22 NOTE — Progress Notes (Signed)
   HPI: 79 y.o. male presenting today as a new patient with a chief complaint of feeling like pins and needles in the bilateral plantar forefoot and right arch that has been gradually worsening over the past 3 years. He reports associated numbness and tingling and intermittent swelling. Walking increases the symptoms. He has been rolling the feet on a tennis ball and taking Meloxicam for treatment. Patient is here for further evaluation and treatment.   Past Medical History:  Diagnosis Date  . BPH (benign prostatic hypertrophy)    Dr. Rosana Hoes / Dr. Joelyn Oms  - Urologist   . Cancer [facility])    lip-mole surgery  . Cataract    bilateral  . Colon polyps   . Constipation    occasional - miralax prn  . DDD (degenerative disc disease)   . Hearing loss    left ear, no hearing aids  . Hypercholesteremia   . Hyperlipidemia   . Hypertension   . Leukoplakia   . Microscopic hematuria    negative work up with Urology in the past.   . OAB (overactive bladder)    with past percutaneous tibial nerve stimulation therapy  . Thrombocytopenia ([place])   . Tinnitus   . Tobacco abuse   . Vitamin B 12 deficiency   . Vitamin D deficiency      Physical Exam: General: The patient is alert and oriented x3 in no acute distress.  Dermatology: Skin is warm, dry and supple bilateral lower extremities. Negative for open lesions or macerations.  Vascular: Palpable pedal pulses bilaterally. No edema or erythema noted. Capillary refill within normal limits.  Neurological: Epicritic and protective threshold intact. Pain on palpation noted to the 2nd MPJ and 1st interspace of the right foot.   Musculoskeletal Exam: Range of motion within normal limits to all pedal and ankle joints bilateral. Muscle strength 5/5 in all groups bilateral.   Radiographic Exam:  Normal osseous mineralization. Joint spaces preserved. No fracture/dislocation/boney destruction.    Assessment: 1. Neuritis right 2nd MPJ / 1st  interspace   Plan of Care:  1. Patient evaluated. X-Rays reviewed.  2. Continue taking Meloxicam as directed by PCP.  3. Injection of 0.5 mLs Celestone Soluspan injected into the 1st interspace of the right foot.  4. Recommended good shoe gear.  5. Return to clinic in 4 weeks.      Edrick Kins, DPM [facility]  Dr. Edrick Kins, DPM    [address], [place] [address]                Office [phone]  Fax [phone]

## 2019-08-10 DIAGNOSIS — H903 Sensorineural hearing loss, bilateral: Secondary | ICD-10-CM | POA: Diagnosis not present

## 2019-08-10 DIAGNOSIS — H6123 Impacted cerumen, bilateral: Secondary | ICD-10-CM | POA: Diagnosis not present

## 2019-08-11 ENCOUNTER — Telehealth: Payer: Self-pay | Admitting: Physician Assistant

## 2019-08-11 NOTE — Chronic Care Management (AMB) (Signed)
Chronic Care Management   Note  08/11/2019 Name: Lonnie Hurley MRN: [id number] DOB: 04/13/1941  Demetruis Depaul is a 79 y.o. year old male who is a primary care patient of Theodoro Clock. I reached out to [name] by phone today in response to a referral sent by Mr. Domenick Cabanilla's health plan.     Mr. Macaraeg was given information about Chronic Care Management services today including:  1. CCM service includes personalized support from designated clinical staff supervised by his physician, including individualized plan of care and coordination with other care providers 2. 24/7 contact phone numbers for assistance for urgent and routine care needs. 3. Service will only be billed when office clinical staff spend 20 minutes or more in a month to coordinate care. 4. Only one practitioner may furnish and bill the service in a calendar month. 5. The patient may stop CCM services at any time (effective at the end of the month) by phone call to the office staff. 6. The patient will be responsible for cost sharing (co-pay) of up to 20% of the service fee (after annual deductible is met).  Patient agreed to services and verbal consent obtained.   Follow up plan: Telephone appointment with care management team member scheduled for:10/11/2019  Noreene Larsson, [place], [place], [place] [address] Direct Dial: [phone] [email or web address] Website: [email or web address]

## 2019-08-15 ENCOUNTER — Ambulatory Visit: Payer: PPO | Admitting: Podiatry

## 2019-08-17 ENCOUNTER — Ambulatory Visit (INDEPENDENT_AMBULATORY_CARE_PROVIDER_SITE_OTHER): Payer: PPO | Admitting: Podiatry

## 2019-08-17 ENCOUNTER — Other Ambulatory Visit: Payer: Self-pay

## 2019-08-17 DIAGNOSIS — G5791 Unspecified mononeuropathy of right lower limb: Secondary | ICD-10-CM | POA: Diagnosis not present

## 2019-08-20 NOTE — Progress Notes (Signed)
   HPI: 79 y.o. male presenting today for follow up evaluation of neuritis right 2nd MPJ / 1st interspace. He reports some improvement in the symptoms. He states the injection and taking the Meloxicam have helped alleviate his symptoms. He denies any worsening factors at this time. Patient is here for further evaluation and treatment.   Past Medical History:  Diagnosis Date  . BPH (benign prostatic hypertrophy)    Dr. Rosana Hoes / Dr. Joelyn Oms  - Urologist   . Cancer [facility])    lip-mole surgery  . Cataract    bilateral  . Colon polyps   . Constipation    occasional - miralax prn  . DDD (degenerative disc disease)   . Hearing loss    left ear, no hearing aids  . Hypercholesteremia   . Hyperlipidemia   . Hypertension   . Leukoplakia   . Microscopic hematuria    negative work up with Urology in the past.   . OAB (overactive bladder)    with past percutaneous tibial nerve stimulation therapy  . Thrombocytopenia ([place])   . Tinnitus   . Tobacco abuse   . Vitamin B 12 deficiency   . Vitamin D deficiency      Physical Exam: General: The patient is alert and oriented x3 in no acute distress.  Dermatology: Skin is warm, dry and supple bilateral lower extremities. Negative for open lesions or macerations.  Vascular: Palpable pedal pulses bilaterally. No edema or erythema noted. Capillary refill within normal limits.  Neurological: Epicritic and protective threshold intact. Pain on palpation noted to the 2nd MPJ and 1st interspace of the right foot.   Musculoskeletal Exam: Range of motion within normal limits to all pedal and ankle joints bilateral. Muscle strength 5/5 in all groups bilateral.   Assessment: 1. Neuritis right 2nd MPJ / 1st interspace - improved 2. Lumbar radiculopathy RLE   Plan of Care:  1. Patient evaluated. 2. Continue taking Meloxicam as directed by PCP.  3. Injection of 0.5 mLs Celestone Soluspan injected into the 1st interspace of the right foot.  4. Recommended  good shoe gear.  5. Return to clinic as needed.      Edrick Kins, DPM [facility]  Dr. Edrick Kins, DPM    [address], [place] [address]                Office [phone]  Fax [phone]

## 2019-09-01 ENCOUNTER — Encounter: Payer: Self-pay | Admitting: Family Medicine

## 2019-09-01 ENCOUNTER — Other Ambulatory Visit: Payer: Self-pay

## 2019-09-01 ENCOUNTER — Ambulatory Visit (INDEPENDENT_AMBULATORY_CARE_PROVIDER_SITE_OTHER): Payer: PPO | Admitting: Family Medicine

## 2019-09-01 VITALS — BP 124/76 | HR 86 | Temp 96.2°F | Ht 65.0 in | Wt 258.8 lb

## 2019-09-01 DIAGNOSIS — R319 Hematuria, unspecified: Secondary | ICD-10-CM

## 2019-09-01 LAB — URINALYSIS
Bilirubin, UA: NEGATIVE
Glucose, UA: NEGATIVE
Ketones, UA: NEGATIVE
Leukocytes,UA: NEGATIVE
Nitrite, UA: NEGATIVE
Specific Gravity, UA: 1.025 (ref 1.005–1.030)
Urobilinogen, Ur: 0.2 mg/dL (ref 0.2–1.0)
pH, UA: 5.5 (ref 5.0–7.5)

## 2019-09-01 MED ORDER — SULFAMETHOXAZOLE-TRIMETHOPRIM 800-160 MG PO TABS: 1.0000 | ORAL_TABLET | Freq: Two times a day (BID) | ORAL | 0 refills | Status: DC

## 2019-09-01 NOTE — Progress Notes (Signed)
Subjective:  Patient ID: Lonnie Hurley, male    DOB: Mar 29, 1941  Age: 79 y.o. MRN: [id number]  CC: Hematuria   HPI Markos Luberda presents for a single incidence of blood in his urine this morning.  He urinated this morning and his urine flow is red.  He states that he has been having flank pain off and on for the last 6 to 12 months intermittently.  It occurs randomly.  Is not related to any type of activity.  There is been no trauma that he is aware of.  He is concerned that when he sits in a place for a long time his buttocks gets hot and he passes a lot of gas.  He also once had some blood in his stool.  He currently denies blood in the stool.  He is not had any abnormal bleeding other than the one episode this morning.  Depression screen [facility] 2/9 09/01/2019 07/11/2019 06/27/2019  Decreased Interest 0 0 0  Down, Depressed, Hopeless 0 0 0  PHQ - 2 Score 0 0 0  Altered sleeping - - -  Tired, decreased energy - - -  Change in appetite - - -  Feeling bad or failure about yourself  - - -  Trouble concentrating - - -  Moving slowly or fidgety/restless - - -  Suicidal thoughts - - -  PHQ-9 Score - - -  Difficult doing work/chores - - -  Some recent data might be hidden    History Yehya has a past medical history of BPH (benign prostatic hypertrophy), Cancer ([place]), Cataract, Colon polyps, Constipation, DDD (degenerative disc disease), Hearing loss, Hypercholesteremia, Hyperlipidemia, Hypertension, Leukoplakia, Microscopic hematuria, OAB (overactive bladder), Thrombocytopenia (HCC), Tinnitus, Tobacco abuse, Vitamin B 12 deficiency, and Vitamin D deficiency.   He has a past surgical history that includes Left Knee Repair (11-10-2006); Hernia repair (1980); Nose surgery; Cataract extraction (2007); Knee surgery (02-28-2008); Skin biopsy (10-08-2006); Cataract extraction (Left, 03/21/15); Colonoscopy (02/28/2014); and Polypectomy.   His family history includes Diabetes in his brother; Glaucoma in  his mother; Heart disease in his brother; Heart failure in his father; Hyperlipidemia in his brother; Hypertension in his brother; Osteoporosis in his brother.He reports that he quit smoking about 10 years ago. His smoking use included cigarettes. He has a 50.00 pack-year smoking history. He has never used smokeless tobacco. He reports that he does not drink alcohol or use drugs.    ROS Review of Systems  Constitutional: Negative.   HENT: Negative.   Eyes: Negative for visual disturbance.  Respiratory: Negative for cough and shortness of breath.   Cardiovascular: Negative for chest pain and leg swelling.  Gastrointestinal: Negative for abdominal pain, diarrhea, nausea and vomiting.  Genitourinary: Negative for difficulty urinating.  Musculoskeletal: Negative for arthralgias and myalgias.  Skin: Negative for rash.  Neurological: Negative for headaches.  Psychiatric/Behavioral: Negative for sleep disturbance.    Objective:  BP 124/76   Pulse 86   Temp (!) 96.2 F (35.7 C)   Ht 5\' 5"  (1.651 m)   Wt 258 lb 12.8 oz (117.4 kg)   SpO2 95%   BMI 43.07 kg/m   BP Readings from Last 3 Encounters:  09/01/19 124/76  07/11/19 134/72  06/27/19 124/80    Wt Readings from Last 3 Encounters:  09/01/19 258 lb 12.8 oz (117.4 kg)  07/11/19 263 lb 9.6 oz (119.6 kg)  06/27/19 260 lb 12.8 oz (118.3 kg)     Physical Exam Vitals reviewed.  Constitutional:  Appearance: He is well-developed. He is obese.  HENT:     Head: Normocephalic and atraumatic.     Right Ear: External ear normal.     Left Ear: External ear normal.     Mouth/Throat:     Pharynx: No oropharyngeal exudate or posterior oropharyngeal erythema.  Eyes:     Pupils: Pupils are equal, round, and reactive to light.  Cardiovascular:     Rate and Rhythm: Normal rate and regular rhythm.     Heart sounds: No murmur.  Pulmonary:     Effort: No respiratory distress.     Breath sounds: Normal breath sounds.  Abdominal:      General: There is distension.     Tenderness: There is no abdominal tenderness.  Musculoskeletal:     Cervical back: Normal range of motion and neck supple.  Neurological:     Mental Status: He is alert and oriented to person, place, and time.       Assessment & Plan:   Ifeanyi was seen today for hematuria.  Diagnoses and all orders for this visit:  Hematuria, unspecified type -     Urinalysis -     Urine Culture -     Ambulatory referral to Urology  Other orders -     sulfamethoxazole-trimethoprim (BACTRIM DS) 800-160 MG tablet; Take 1 tablet by mouth 2 (two) times daily. Until gone, for infection       I am having [name] start on sulfamethoxazole-trimethoprim. I am also having him maintain his Vitamin D3, multivitamin with minerals, nystatin-triamcinolone, acetaminophen, Calcium Carb-Cholecalciferol (CALCIUM 600 + D PO), Glucosamine-Chondroit-Vit C-Mn (GLUCOSAMINE 1500 COMPLEX PO), meloxicam, oxybutynin, ramipril, simvastatin, tamsulosin, hydrochlorothiazide, ezetimibe, glucose blood, and polyethylene glycol powder.  Allergies as of 09/01/2019      Reactions   Penicillins Other (See Comments)   unknown   Aspirin Other (See Comments)   Causes Blood in urine   Crestor [rosuvastatin Calcium] Other (See Comments)   Myalgia, "joint problem"   Gabapentin Other (See Comments)   Blurry vision   Hydrocodone Other (See Comments)   Constipation    Lyrica [pregabalin]    Blurry vision      Medication List       Accurate as of September 01, 2019 11:59 PM. If you have any questions, ask your nurse or doctor.        acetaminophen 500 MG tablet Commonly known as: TYLENOL Take 500 mg by mouth every 6 (six) hours as needed.   CALCIUM 600 + D PO Take by mouth 2 (two) times daily.   ezetimibe 10 MG tablet Commonly known as: ZETIA Take 1 tablet (10 mg total) by mouth daily.   GLUCOSAMINE 1500 COMPLEX PO Take by mouth 2 (two) times daily.   glucose blood test  strip Commonly known as: ONE TOUCH ULTRA TEST Use as instructed   hydrochlorothiazide 25 MG tablet Commonly known as: HYDRODIURIL Take 1 tablet (25 mg total) by mouth daily.   meloxicam 7.5 MG tablet Commonly known as: MOBIC TAKE 1 TABLET BY MOUTH EVERY DAY What changed:   how much to take  how to take this  when to take this  additional instructions   multivitamin with minerals tablet Take 1 tablet by mouth daily.   nystatin-triamcinolone cream Commonly known as: MYCOLOG II Apply 1 application topically 2 (two) times daily.   oxybutynin 10 MG 24 hr tablet Commonly known as: DITROPAN-XL Take 10 mg by mouth daily.   polyethylene glycol powder 17 GM/SCOOP powder  Commonly known as: MiraLax Take 17 g by mouth as needed for moderate constipation.   ramipril 10 MG capsule Commonly known as: ALTACE Take 1 capsule (10 mg total) by mouth daily.   simvastatin 40 MG tablet Commonly known as: ZOCOR Take 1 tablet (40 mg total) by mouth at bedtime.   sulfamethoxazole-trimethoprim 800-160 MG tablet Commonly known as: BACTRIM DS Take 1 tablet by mouth 2 (two) times daily. Until gone, for infection Started by: Claretta Fraise, MD   tamsulosin 0.4 MG Caps capsule Commonly known as: Flomax Take 1 capsule (0.4 mg total) by mouth at bedtime.   Vitamin D3 50 MCG (2000 UT) Tabs Take 1 tablet by mouth daily.        Follow-up: Return if symptoms worsen or fail to improve.  Claretta Fraise, M.D.

## 2019-09-02 ENCOUNTER — Encounter: Payer: Self-pay | Admitting: Family Medicine

## 2019-09-02 LAB — URINE CULTURE

## 2019-09-12 ENCOUNTER — Ambulatory Visit (INDEPENDENT_AMBULATORY_CARE_PROVIDER_SITE_OTHER): Payer: PPO

## 2019-09-12 ENCOUNTER — Other Ambulatory Visit: Payer: Self-pay

## 2019-09-12 ENCOUNTER — Encounter: Payer: Self-pay | Admitting: Family Medicine

## 2019-09-12 ENCOUNTER — Ambulatory Visit (INDEPENDENT_AMBULATORY_CARE_PROVIDER_SITE_OTHER): Payer: PPO | Admitting: Family Medicine

## 2019-09-12 VITALS — BP 107/62 | HR 80 | Temp 99.8°F | Ht 65.0 in | Wt 255.4 lb

## 2019-09-12 DIAGNOSIS — M25512 Pain in left shoulder: Secondary | ICD-10-CM

## 2019-09-12 DIAGNOSIS — M19012 Primary osteoarthritis, left shoulder: Secondary | ICD-10-CM

## 2019-09-12 MED ORDER — PREDNISONE 20 MG PO TABS: ORAL_TABLET | ORAL | 0 refills | Status: DC

## 2019-09-12 MED ORDER — DICLOFENAC SODIUM 1 % EX GEL: 2.0000 g | Freq: Four times a day (QID) | CUTANEOUS | 1 refills | Status: DC

## 2019-09-12 NOTE — Progress Notes (Signed)
Lonnie Hurley is a 79 y.o. male  Shoulder Pain: Patient complaints of left shoulder pain. No known injury. The pain is described as aching, burning, shooting and throbbing.  The onset of the pain was gradual, starting about 1 week ago.  The pain occurs every day and lasts 2 to four hours.  Location is anterior. No history of dislocation. Symptoms are aggravated by reaching, lifting, pulling, pushing, twisting. Symptoms are diminisohed by  rest, medication: tylenol srthritis.   Limited activities include: reaching, lifting, pulling, pushing, carrying, twisting. mild stiffness, no weakness, no swelling, no crepitus noted is reported. Patient is retired.   Past Medical History:  Diagnosis Date  . BPH (benign prostatic hypertrophy)    Dr. Rosana Hoes / Dr. Joelyn Oms  - Urologist   . Cancer [facility])    lip-mole surgery  . Cataract    bilateral  . Colon polyps   . Constipation    occasional - miralax prn  . DDD (degenerative disc disease)   . Hearing loss    left ear, no hearing aids  . Hypercholesteremia   . Hyperlipidemia   . Hypertension   . Leukoplakia   . Microscopic hematuria    negative work up with Urology in the past.   . OAB (overactive bladder)    with past percutaneous tibial nerve stimulation therapy  . Thrombocytopenia ([place])   . Tinnitus   . Tobacco abuse   . Vitamin B 12 deficiency   . Vitamin D deficiency    Past Surgical History:  Procedure Laterality Date  . CATARACT EXTRACTION  2007   right eye  . CATARACT EXTRACTION Left 03/21/15  . COLONOSCOPY  02/28/2014  . HERNIA REPAIR  1980  . KNEE SURGERY  02-28-2008   replaced inside right knee  . Left Knee Repair  11-10-2006  . NOSE SURGERY    . POLYPECTOMY     colon polyps  . SKIN BIOPSY  10-08-2006   left ear    Current Outpatient Medications:  .  acetaminophen (TYLENOL) 500 MG tablet, Take 500 mg by mouth every 6 (six) hours as needed., Disp: , Rfl:  .  Calcium Carb-Cholecalciferol (CALCIUM 600 + D PO), Take by mouth  2 (two) times daily. , Disp: , Rfl:  .  Cholecalciferol (VITAMIN D3) 2000 UNITS TABS, Take 1 tablet by mouth daily. , Disp: , Rfl:  .  ezetimibe (ZETIA) 10 MG tablet, Take 1 tablet (10 mg total) by mouth daily., Disp: 90 tablet, Rfl: 3 .  Glucosamine-Chondroit-Vit C-Mn (GLUCOSAMINE 1500 COMPLEX PO), Take by mouth 2 (two) times daily. , Disp: , Rfl:  .  glucose blood (ONE TOUCH ULTRA TEST) test strip, Use as instructed, Disp: 50 each, Rfl: 11 .  hydrochlorothiazide (HYDRODIURIL) 25 MG tablet, Take 1 tablet (25 mg total) by mouth daily., Disp: 90 tablet, Rfl: 1 .  meloxicam (MOBIC) 7.5 MG tablet, TAKE 1 TABLET BY MOUTH EVERY DAY (Patient taking differently: As needed), Disp: 30 tablet, Rfl: 2 .  Multiple Vitamins-Minerals (MULTIVITAMIN WITH MINERALS) tablet, Take 1 tablet by mouth daily., Disp: , Rfl:  .  nystatin-triamcinolone (MYCOLOG II) cream, Apply 1 application topically 2 (two) times daily., Disp: 30 g, Rfl: 0 .  oxybutynin (DITROPAN-XL) 10 MG 24 hr tablet, Take 10 mg by mouth daily., Disp: , Rfl:  .  polyethylene glycol powder (MIRALAX) 17 GM/SCOOP powder, Take 17 g by mouth as needed for moderate constipation., Disp: 1700 g, Rfl: 2 .  ramipril (ALTACE) 10 MG capsule, Take 1  capsule (10 mg total) by mouth daily., Disp: 90 capsule, Rfl: 3 .  simvastatin (ZOCOR) 40 MG tablet, Take 1 tablet (40 mg total) by mouth at bedtime., Disp: 90 tablet, Rfl: 3 .  tamsulosin (FLOMAX) 0.4 MG CAPS capsule, Take 1 capsule (0.4 mg total) by mouth at bedtime., Disp: 90 capsule, Rfl: 3 Allergies  Allergen Reactions  . Penicillins Other (See Comments)    unknown  . Aspirin Other (See Comments)    Causes Blood in urine  . Crestor [Rosuvastatin Calcium] Other (See Comments)    Myalgia, "joint problem"  . Gabapentin Other (See Comments)    Blurry vision   . Hydrocodone Other (See Comments)    Constipation    . Lyrica [Pregabalin]     Blurry vision     reports that he quit smoking about 10 years ago. His  smoking use included cigarettes. He has a 50.00 pack-year smoking history. He has never used smokeless tobacco. He reports that he does not drink alcohol or use drugs. Family History  Problem Relation Age of Onset  . Heart failure Father   . Glaucoma Mother   . Osteoporosis Brother   . Heart disease Brother   . Hypertension Brother   . Hyperlipidemia Brother   . Diabetes Brother   . Colon cancer Neg Hx   . Stomach cancer Neg Hx   . Rectal cancer Neg Hx   Review of Systems  Cardiovascular: Negative for chest pain and palpitations.  Musculoskeletal: Positive for joint pain and myalgias. Negative for back pain, falls and neck pain.  Neurological: Negative for dizziness, tingling, tremors, sensory change, speech change, focal weakness, seizures, loss of consciousness, weakness and headaches.  All other systems reviewed and are negative.   Physical Exam  Constitutional: He is oriented to person, place, and time and well-developed, well-nourished, and in no distress. Vital signs are normal.  Eyes: Pupils are equal, round, and reactive to light.  Cardiovascular: Normal rate and regular rhythm.  Pulmonary/Chest: Effort normal and breath sounds normal.  Musculoskeletal:     Left shoulder: Tenderness and pain present. No swelling, deformity, effusion, laceration, bony tenderness, crepitus or spasms. Decreased range of motion. Normal strength. Normal pulse.     Left elbow: Normal.     Cervical back: Normal range of motion and neck supple. Normal.  Neurological: He is alert and oriented to person, place, and time.  Skin: Skin is warm and dry.  Psychiatric: Mood, memory, affect and judgment normal.    Shoulder: Left Inspection reveals no abnormalities, atrophy or asymmetry. Tender to palpation, anterior aspect. No signs of impingement with negative Neer and Hawkin's tests, empty can. Speeds and Yergason's tests normal. Normal scapular function observed.  X-Ray: left shoulder: Arthritic  changes. No acute findings. Preliminary x-ray reading by Monia Pouch, FNP-C, [facility].   Assessment and Plan  Lonnie Hurley was seen today for shoulder pain.  Diagnoses and all orders for this visit:  Acute pain of left shoulder Primary osteoarthritis of left shoulder Imaging revealed arthritic changes. No acute abnormalities. Will notify pt if radiology reading differs. Symptomatic care discussed in detail. Medications as prescribed. Follow up in 4-6 weeks if not improving.  -     predniSONE (DELTASONE) 20 MG tablet; 2 po at sametime daily for 5 days -     diclofenac Sodium (VOLTAREN) 1 % GEL; Apply 2 g topically 4 (four) times daily.   The above assessment and management plan was discussed with the patient. The patient verbalized understanding of and  has agreed to the management plan. Patient is aware to call the clinic if they develop any new symptoms or if symptoms fail to improve or worsen. Patient is aware when to return to the clinic for a follow-up visit. Patient educated on when it is appropriate to go to the emergency department.   Return in 4 weeks (on 10/10/2019).   Monia Pouch, FNP-C [facility] [address] [place], [place] [address] [phone]

## 2019-09-12 NOTE — Patient Instructions (Signed)

## 2019-09-20 DIAGNOSIS — D692 Other nonthrombocytopenic purpura: Secondary | ICD-10-CM | POA: Diagnosis not present

## 2019-09-20 DIAGNOSIS — D1801 Hemangioma of skin and subcutaneous tissue: Secondary | ICD-10-CM | POA: Diagnosis not present

## 2019-09-20 DIAGNOSIS — L812 Freckles: Secondary | ICD-10-CM | POA: Diagnosis not present

## 2019-09-20 DIAGNOSIS — L821 Other seborrheic keratosis: Secondary | ICD-10-CM | POA: Diagnosis not present

## 2019-09-20 DIAGNOSIS — L723 Sebaceous cyst: Secondary | ICD-10-CM | POA: Diagnosis not present

## 2019-09-20 DIAGNOSIS — L570 Actinic keratosis: Secondary | ICD-10-CM | POA: Diagnosis not present

## 2019-09-20 DIAGNOSIS — Z85828 Personal history of other malignant neoplasm of skin: Secondary | ICD-10-CM | POA: Diagnosis not present

## 2019-10-06 ENCOUNTER — Encounter: Payer: Self-pay | Admitting: Family

## 2019-10-06 ENCOUNTER — Ambulatory Visit (INDEPENDENT_AMBULATORY_CARE_PROVIDER_SITE_OTHER): Payer: PPO | Admitting: Family

## 2019-10-06 ENCOUNTER — Other Ambulatory Visit: Payer: Self-pay

## 2019-10-06 VITALS — BP 108/67 | HR 88 | Temp 96.8°F | Ht 65.0 in | Wt 259.8 lb

## 2019-10-06 DIAGNOSIS — M79605 Pain in left leg: Secondary | ICD-10-CM | POA: Diagnosis not present

## 2019-10-06 DIAGNOSIS — M79604 Pain in right leg: Secondary | ICD-10-CM

## 2019-10-06 MED ORDER — GABAPENTIN 100 MG PO CAPS: 100.0000 mg | ORAL_CAPSULE | Freq: Three times a day (TID) | ORAL | 3 refills | Status: DC

## 2019-10-06 NOTE — Patient Instructions (Signed)
Radicular Pain Radicular pain is a type of pain that spreads from your back or neck along a spinal nerve. Spinal nerves are nerves that leave the spinal cord and go to the muscles. Radicular pain is sometimes called radiculopathy, radiculitis, or a pinched nerve. When you have this type of pain, you may also have weakness, numbness, or tingling in the area of your body that is supplied by the nerve. The pain may feel sharp and burning. Depending on which spinal nerve is affected, the pain may occur in the:  Neck area (cervical radicular pain). You may also feel pain, numbness, weakness, or tingling in the arms.  Mid-spine area (thoracic radicular pain). You would feel this pain in the back and chest. This type is rare.  Lower back area (lumbar radicular pain). You would feel this pain as low back pain. You may feel pain, numbness, weakness, or tingling in the buttocks or legs. Sciatica is a type of lumbar radicular pain that shoots down the back of the leg. Radicular pain occurs when one of the spinal nerves becomes irritated or squeezed (compressed). It is often caused by something pushing on a spinal nerve, such as one of the bones of the spine (vertebrae) or one of the round cushions between vertebrae (intervertebral disks). This can result from:  An injury.  Wear and tear or aging of a disk.  The growth of a bone spur that pushes on the nerve. Radicular pain often goes away when you follow instructions from your health care provider for relieving pain at home. Follow these instructions at home: Managing pain      If directed, put ice on the affected area: ? Put ice in a plastic bag. ? Place a towel between your skin and the bag. ? Leave the ice on for 20 minutes, 2-3 times a day.  If directed, apply heat to the affected area as often as told by your health care provider. Use the heat source that your health care provider recommends, such as a moist heat pack or a heating pad. ? Place  a towel between your skin and the heat source. ? Leave the heat on for 20-30 minutes. ? Remove the heat if your skin turns bright red. This is especially important if you are unable to feel pain, heat, or cold. You may have a greater risk of getting burned. Activity   Do not sit or rest in bed for long periods of time.  Try to stay as active as possible. Ask your health care provider what type of exercise or activity is best for you.  Avoid activities that make your pain worse, such as bending and lifting.  Do not lift anything that is heavier than 10 lb (4.5 kg), or the limit that you are told, until your health care provider says that it is safe.  Practice using proper technique when lifting items. Proper lifting technique involves bending your knees and rising up.  Do strength and range-of-motion exercises only as told by your health care provider or physical therapist. General instructions  Take over-the-counter and prescription medicines only as told by your health care provider.  Pay attention to any changes in your symptoms.  Keep all follow-up visits as told by your health care provider. This is important. ? Your health care provider may send you to a physical therapist to help with this pain. Contact a health care provider if:  Your pain and other symptoms get worse.  Your pain medicine is not   helping.  Your pain has not improved after a few weeks of home care.  You have a fever. Get help right away if:  You have severe pain, weakness, or numbness.  You have difficulty with bladder or bowel control. Summary  Radicular pain is a type of pain that spreads from your back or neck along a spinal nerve.  When you have radicular pain, you may also have weakness, numbness, or tingling in the area of your body that is supplied by the nerve.  The pain may feel sharp or burning.  Radicular pain may be treated with ice, heat, medicines, or physical therapy. This  information is not intended to replace advice given to you by your health care provider. Make sure you discuss any questions you have with your health care provider. Document Revised: 12/15/2017 Document Reviewed: 12/15/2017 Elsevier Patient Education  2020 [employer]  

## 2019-10-06 NOTE — Progress Notes (Addendum)
Subjective:  Patient ID: Lonnie Hurley, male    DOB: October 12, 1940, 79 y.o.   MRN: [id number]  Patient Care Team: Sharion Balloon, FNP as PCP - General (Family Medicine) Nobie Putnam, MD (Hematology and Oncology) Irine Seal, MD as Attending Physician (Urology) Williford, Layne Benton, MD as Consulting Physician (Dermatology) Warden Fillers, MD as Consulting Physician (Ophthalmology) Erline Levine, MD as Consulting Physician (Neurosurgery) Ilean China, RN as Registered Nurse   Chief Complaint:  Leg Pain   HPI: Lonnie Hurley is a 79 y.o. male presenting on 10/06/2019 for Leg Pain   Patient presents today with bilateral leg pain.   Leg Pain  The incident occurred more than 1 week ago. There was no injury mechanism. The pain is present in the left leg and right leg. The quality of the pain is described as aching. The pain is moderate. The pain has been constant since onset. Associated symptoms include muscle weakness. He reports no foreign bodies present. The symptoms are aggravated by movement. He has tried ice for the symptoms. The treatment provided mild relief.   He reports this pain is constant and is worse when he stands or walks for a period of time, but worse when he sits on "hard bench or sits too long".    Relevant past medical, surgical, family, and social history reviewed and updated as indicated.  Allergies and medications reviewed and updated. Date reviewed: Chart in Epic.   Past Medical History:  Diagnosis Date  . BPH (benign prostatic hypertrophy)    Dr. Rosana Hoes / Dr. Joelyn Oms  - Urologist   . Cancer [facility])    lip-mole surgery  . Cataract    bilateral  . Colon polyps   . Constipation    occasional - miralax prn  . DDD (degenerative disc disease)   . Hearing loss    left ear, no hearing aids  . Hypercholesteremia   . Hyperlipidemia   . Hypertension   . Leukoplakia   . Microscopic hematuria    negative work up with Urology in the past.   . OAB  (overactive bladder)    with past percutaneous tibial nerve stimulation therapy  . Thrombocytopenia ([place])   . Tinnitus   . Tobacco abuse   . Vitamin B 12 deficiency   . Vitamin D deficiency     Past Surgical History:  Procedure Laterality Date  . CATARACT EXTRACTION  2007   right eye  . CATARACT EXTRACTION Left 03/21/15  . COLONOSCOPY  02/28/2014  . HERNIA REPAIR  1980  . KNEE SURGERY  02-28-2008   replaced inside right knee  . Left Knee Repair  11-10-2006  . NOSE SURGERY    . POLYPECTOMY     colon polyps  . SKIN BIOPSY  10-08-2006   left ear    Social History   Socioeconomic History  . Marital status: Single    Spouse name: Not on file  . Number of children: 0  . Years of education: Not on file  . Highest education level: Not on file  Occupational History    Comment: retired Immunologist  Tobacco Use  . Smoking status: Former Smoker    Packs/day: 1.00    Years: 50.00    Pack years: 50.00    Types: Cigarettes    Quit date: 08/14/2009    Years since quitting: 10.1  . Smokeless tobacco: Never Used  Substance and Sexual Activity  . Alcohol use: No  . Drug use: No  .  Sexual activity: Not Currently  Other Topics Concern  . Not on file  Social History Narrative  . Not on file   Social Determinants of Health   Financial Resource Strain:   . Difficulty of Paying Living Expenses:   Food Insecurity:   . Worried About Charity fundraiser in the Last Year:   . Arboriculturist in the Last Year:   Transportation Needs: No Transportation Needs  . Lack of Transportation (Medical): No  . Lack of Transportation (Non-Medical): No  Physical Activity:   . Days of Exercise per Week:   . Minutes of Exercise per Session:   Stress:   . Feeling of Stress :   Social Connections:   . Frequency of Communication with Friends and Family:   . Frequency of Social Gatherings with Friends and Family:   . Attends Religious Services:   . Active Member of Clubs or  Organizations:   . Attends Archivist Meetings:   Marland Kitchen Marital Status:   Intimate Partner Violence: Not At Risk  . Fear of Current or Ex-Partner: No  . Emotionally Abused: No  . Physically Abused: No  . Sexually Abused: No    Outpatient Encounter Medications as of 10/06/2019  Medication Sig  . acetaminophen (TYLENOL) 500 MG tablet Take 500 mg by mouth every 6 (six) hours as needed.  . Calcium Carb-Cholecalciferol (CALCIUM 600 + D PO) Take by mouth 2 (two) times daily.   . Cholecalciferol (VITAMIN D3) 2000 UNITS TABS Take 1 tablet by mouth daily.   . diclofenac Sodium (VOLTAREN) 1 % GEL Apply 2 g topically 4 (four) times daily.  Marland Kitchen ezetimibe (ZETIA) 10 MG tablet Take 1 tablet (10 mg total) by mouth daily.  . Glucosamine-Chondroit-Vit C-Mn (GLUCOSAMINE 1500 COMPLEX PO) Take by mouth 2 (two) times daily.   Marland Kitchen glucose blood (ONE TOUCH ULTRA TEST) test strip Use as instructed  . hydrochlorothiazide (HYDRODIURIL) 25 MG tablet Take 1 tablet (25 mg total) by mouth daily.  . meloxicam (MOBIC) 7.5 MG tablet TAKE 1 TABLET BY MOUTH EVERY DAY (Patient taking differently: As needed)  . Multiple Vitamins-Minerals (MULTIVITAMIN WITH MINERALS) tablet Take 1 tablet by mouth daily.  Marland Kitchen nystatin-triamcinolone (MYCOLOG II) cream Apply 1 application topically 2 (two) times daily.  Marland Kitchen oxybutynin (DITROPAN-XL) 10 MG 24 hr tablet Take 10 mg by mouth daily.  . polyethylene glycol powder (MIRALAX) 17 GM/SCOOP powder Take 17 g by mouth as needed for moderate constipation.  . predniSONE (DELTASONE) 20 MG tablet 2 po at sametime daily for 5 days  . ramipril (ALTACE) 10 MG capsule Take 1 capsule (10 mg total) by mouth daily.  . simvastatin (ZOCOR) 40 MG tablet Take 1 tablet (40 mg total) by mouth at bedtime.  . tamsulosin (FLOMAX) 0.4 MG CAPS capsule Take 1 capsule (0.4 mg total) by mouth at bedtime.   No facility-administered encounter medications on file as of 10/06/2019.    Allergies  Allergen Reactions  .  Penicillins Other (See Comments)    unknown  . Aspirin Other (See Comments)    Causes Blood in urine  . Crestor [Rosuvastatin Calcium] Other (See Comments)    Myalgia, "joint problem"  . Gabapentin Other (See Comments)    Blurry vision   . Hydrocodone Other (See Comments)    Constipation    . Lyrica [Pregabalin]     Blurry vision     Review of Systems  Constitutional: Positive for activity change.  Respiratory: Positive for shortness of breath.  Musculoskeletal: Positive for gait problem.  All other systems reviewed and are negative.       Objective:  BP 108/67   Pulse 88   Temp (!) 96.8 F (36 C) (Temporal)   Ht 5\' 5"  (1.651 m)   Wt 259 lb 12.8 oz (117.8 kg)   SpO2 94%   BMI 43.23 kg/m    Wt Readings from Last 3 Encounters:  10/06/19 259 lb 12.8 oz (117.8 kg)  09/12/19 255 lb 6.4 oz (115.8 kg)  09/01/19 258 lb 12.8 oz (117.4 kg)    Physical Exam Constitutional:      Appearance: He is obese.  HENT:     Head: Normocephalic.  Eyes:     Conjunctiva/sclera: Conjunctivae normal.  Cardiovascular:     Rate and Rhythm: Regular rhythm.     Pulses:          Posterior tibial pulses are 1+ on the right side and 1+ on the left side.     Heart sounds: Murmur present. Systolic murmur present with a grade of 3/6.  Abdominal:     General: Bowel sounds are normal.     Palpations: Abdomen is soft.  Musculoskeletal:     Right lower leg: No edema.     Left lower leg: No edema.  Neurological:     Mental Status: He is alert and oriented to person, place, and time.  Psychiatric:        Mood and Affect: Mood normal.     Results for orders placed or performed in visit on 09/01/19  Urine Culture   Specimen: Urine   UR  Result Value Ref Range   Urine Culture, Routine Final report    Organism ID, Bacteria Comment   Urinalysis  Result Value Ref Range   Specific Gravity, UA 1.025 1.005 - 1.030   pH, UA 5.5 5.0 - 7.5   Color, UA Yellow Yellow   Appearance Ur Clear  Clear   Leukocytes,UA Negative Negative   Protein,UA Trace (A) Negative/Trace   Glucose, UA Negative Negative   Ketones, UA Negative Negative   RBC, UA 2+ (A) Negative   Bilirubin, UA Negative Negative   Urobilinogen, Ur 0.2 0.2 - 1.0 mg/dL   Nitrite, UA Negative Negative       Pertinent labs & imaging results that were available during my care of the patient were reviewed by me and considered in my medical decision making.  Assessment & Plan:  There are no diagnoses linked to this encounter.   Continue all other maintenance medications.  Follow up plan: Follow up in 2 weeks on 10/18/19 for evaluation of symptoms with Gabapentin. Long discussion if "dull achy eye pain" returns to call and we will stop gabapentin. Will start low dose.  Encouraged weight loss and exercise Referral to PT   Medical decision-making:  15 minutes spent today reviewing the medical chart, counseling the patient/family, and documenting today's visit.  Continue healthy lifestyle choices, including diet (rich in fruits, vegetables, and lean proteins, and low in salt and simple carbohydrates) and exercise (at least 30 minutes of moderate physical activity daily).  Educational handout given for radiculopathy.  The above assessment and management plan was discussed with the patient. The patient verbalized understanding of and has agreed to the management plan. Patient is aware to call the clinic if they develop any new symptoms or if symptoms persist or worsen. Patient is aware when to return to the clinic for a follow-up visit. Patient educated on when it  is appropriate to go to the emergency department.   Robynn Pane, RN, FNP student  [name], [facility] [phone]

## 2019-10-06 NOTE — Progress Notes (Signed)
Subjective:  Patient ID: Lonnie Hurley, male    DOB: July 08, 1940, 79 y.o.   MRN: [id number]  Patient Care Team: Sharion Balloon, FNP as PCP - General (Family Medicine) Nobie Putnam, MD (Hematology and Oncology) Irine Seal, MD as Attending Physician (Urology) Williford, Layne Benton, MD as Consulting Physician (Dermatology) Warden Fillers, MD as Consulting Physician (Ophthalmology) Erline Levine, MD as Consulting Physician (Neurosurgery) Ilean China, RN as Registered Nurse   Chief Complaint:  Leg Pain   HPI: Lonnie Hurley is a 79 y.o. male presenting on 10/06/2019 for Leg Pain   Patient presents today with bilateral leg pain.   Leg Pain  The incident occurred more than 1 week ago. There was no injury mechanism. The pain is present in the left leg and right leg. The quality of the pain is described as aching. The pain is moderate. The pain has been constant since onset. Associated symptoms include muscle weakness. He reports no foreign bodies present. The symptoms are aggravated by movement. He has tried ice for the symptoms. The treatment provided mild relief.   He reports this pain is constant and is worse when he stands or walks for a period of time, but worse when he sits on "hard bench or sits too long".    Relevant past medical, surgical, family, and social history reviewed and updated as indicated.  Allergies and medications reviewed and updated. Date reviewed: Chart in Epic.   Past Medical History:  Diagnosis Date  . BPH (benign prostatic hypertrophy)    Dr. Rosana Hoes / Dr. Joelyn Oms  - Urologist   . Cancer [facility])    lip-mole surgery  . Cataract    bilateral  . Colon polyps   . Constipation    occasional - miralax prn  . DDD (degenerative disc disease)   . Hearing loss    left ear, no hearing aids  . Hypercholesteremia   . Hyperlipidemia   . Hypertension   . Leukoplakia   . Microscopic hematuria    negative work up with Urology in the past.   . OAB  (overactive bladder)    with past percutaneous tibial nerve stimulation therapy  . Thrombocytopenia ([place])   . Tinnitus   . Tobacco abuse   . Vitamin B 12 deficiency   . Vitamin D deficiency     Past Surgical History:  Procedure Laterality Date  . CATARACT EXTRACTION  2007   right eye  . CATARACT EXTRACTION Left 03/21/15  . COLONOSCOPY  02/28/2014  . HERNIA REPAIR  1980  . KNEE SURGERY  02-28-2008   replaced inside right knee  . Left Knee Repair  11-10-2006  . NOSE SURGERY    . POLYPECTOMY     colon polyps  . SKIN BIOPSY  10-08-2006   left ear    Social History   Socioeconomic History  . Marital status: Single    Spouse name: Not on file  . Number of children: 0  . Years of education: Not on file  . Highest education level: Not on file  Occupational History    Comment: retired Immunologist  Tobacco Use  . Smoking status: Former Smoker    Packs/day: 1.00    Years: 50.00    Pack years: 50.00    Types: Cigarettes    Quit date: 08/14/2009    Years since quitting: 10.1  . Smokeless tobacco: Never Used  Substance and Sexual Activity  . Alcohol use: No  . Drug use: No  .  Sexual activity: Not Currently  Other Topics Concern  . Not on file  Social History Narrative  . Not on file   Social Determinants of Health   Financial Resource Strain:   . Difficulty of Paying Living Expenses:   Food Insecurity:   . Worried About Charity fundraiser in the Last Year:   . Arboriculturist in the Last Year:   Transportation Needs: No Transportation Needs  . Lack of Transportation (Medical): No  . Lack of Transportation (Non-Medical): No  Physical Activity:   . Days of Exercise per Week:   . Minutes of Exercise per Session:   Stress:   . Feeling of Stress :   Social Connections:   . Frequency of Communication with Friends and Family:   . Frequency of Social Gatherings with Friends and Family:   . Attends Religious Services:   . Active Member of Clubs or  Organizations:   . Attends Archivist Meetings:   Marland Kitchen Marital Status:   Intimate Partner Violence: Not At Risk  . Fear of Current or Ex-Partner: No  . Emotionally Abused: No  . Physically Abused: No  . Sexually Abused: No    Outpatient Encounter Medications as of 10/06/2019  Medication Sig  . acetaminophen (TYLENOL) 500 MG tablet Take 500 mg by mouth every 6 (six) hours as needed.  . Calcium Carb-Cholecalciferol (CALCIUM 600 + D PO) Take by mouth 2 (two) times daily.   . Cholecalciferol (VITAMIN D3) 2000 UNITS TABS Take 1 tablet by mouth daily.   . diclofenac Sodium (VOLTAREN) 1 % GEL Apply 2 g topically 4 (four) times daily.  Marland Kitchen ezetimibe (ZETIA) 10 MG tablet Take 1 tablet (10 mg total) by mouth daily.  . Glucosamine-Chondroit-Vit C-Mn (GLUCOSAMINE 1500 COMPLEX PO) Take by mouth 2 (two) times daily.   Marland Kitchen glucose blood (ONE TOUCH ULTRA TEST) test strip Use as instructed  . hydrochlorothiazide (HYDRODIURIL) 25 MG tablet Take 1 tablet (25 mg total) by mouth daily.  . meloxicam (MOBIC) 7.5 MG tablet TAKE 1 TABLET BY MOUTH EVERY DAY (Patient taking differently: As needed)  . Multiple Vitamins-Minerals (MULTIVITAMIN WITH MINERALS) tablet Take 1 tablet by mouth daily.  Marland Kitchen nystatin-triamcinolone (MYCOLOG II) cream Apply 1 application topically 2 (two) times daily.  Marland Kitchen oxybutynin (DITROPAN-XL) 10 MG 24 hr tablet Take 10 mg by mouth daily.  . polyethylene glycol powder (MIRALAX) 17 GM/SCOOP powder Take 17 g by mouth as needed for moderate constipation.  . predniSONE (DELTASONE) 20 MG tablet 2 po at sametime daily for 5 days  . ramipril (ALTACE) 10 MG capsule Take 1 capsule (10 mg total) by mouth daily.  . simvastatin (ZOCOR) 40 MG tablet Take 1 tablet (40 mg total) by mouth at bedtime.  . tamsulosin (FLOMAX) 0.4 MG CAPS capsule Take 1 capsule (0.4 mg total) by mouth at bedtime.   No facility-administered encounter medications on file as of 10/06/2019.    Allergies  Allergen Reactions  .  Penicillins Other (See Comments)    unknown  . Aspirin Other (See Comments)    Causes Blood in urine  . Crestor [Rosuvastatin Calcium] Other (See Comments)    Myalgia, "joint problem"  . Gabapentin Other (See Comments)    Blurry vision   . Hydrocodone Other (See Comments)    Constipation    . Lyrica [Pregabalin]     Blurry vision     Review of Systems  Constitutional: Positive for activity change.  Respiratory: Positive for shortness of breath.  Musculoskeletal: Positive for gait problem.  All other systems reviewed and are negative.       Objective:  BP 108/67   Pulse 88   Temp (!) 96.8 F (36 C) (Temporal)   Ht 5\' 5"  (1.651 m)   Wt 259 lb 12.8 oz (117.8 kg)   SpO2 94%   BMI 43.23 kg/m    Wt Readings from Last 3 Encounters:  10/06/19 259 lb 12.8 oz (117.8 kg)  09/12/19 255 lb 6.4 oz (115.8 kg)  09/01/19 258 lb 12.8 oz (117.4 kg)    Physical Exam Constitutional:      Appearance: He is obese.  HENT:     Head: Normocephalic.  Eyes:     Conjunctiva/sclera: Conjunctivae normal.  Cardiovascular:     Rate and Rhythm: Regular rhythm.     Pulses:          Posterior tibial pulses are 1+ on the right side and 1+ on the left side.     Heart sounds: Murmur present. Systolic murmur present with a grade of 3/6.  Abdominal:     General: Bowel sounds are normal.     Palpations: Abdomen is soft.  Musculoskeletal:     Right lower leg: No edema.     Left lower leg: No edema.  Neurological:     Mental Status: He is alert and oriented to person, place, and time.  Psychiatric:        Mood and Affect: Mood normal.     Results for orders placed or performed in visit on 09/01/19  Urine Culture   Specimen: Urine   UR  Result Value Ref Range   Urine Culture, Routine Final report    Organism ID, Bacteria Comment   Urinalysis  Result Value Ref Range   Specific Gravity, UA 1.025 1.005 - 1.030   pH, UA 5.5 5.0 - 7.5   Color, UA Yellow Yellow   Appearance Ur Clear  Clear   Leukocytes,UA Negative Negative   Protein,UA Trace (A) Negative/Trace   Glucose, UA Negative Negative   Ketones, UA Negative Negative   RBC, UA 2+ (A) Negative   Bilirubin, UA Negative Negative   Urobilinogen, Ur 0.2 0.2 - 1.0 mg/dL   Nitrite, UA Negative Negative       Pertinent labs & imaging results that were available during my care of the patient were reviewed by me and considered in my medical decision making.  Assessment & Plan:  There are no diagnoses linked to this encounter.   Continue all other maintenance medications.  Follow up plan: Follow up in 2 weeks on 10/18/19 for evaluation of symptoms with Gabapentin. Long discussion if "dull achy eye pain" returns to call and we will stop gabapentin. Will start low dose.  Encouraged weight loss and exercise Referral to PT   Medical decision-making:  15 minutes spent today reviewing the medical chart, counseling the patient/family, and documenting today's visit.  Continue healthy lifestyle choices, including diet (rich in fruits, vegetables, and lean proteins, and low in salt and simple carbohydrates) and exercise (at least 30 minutes of moderate physical activity daily).  Educational handout given for radiculopathy.  The above assessment and management plan was discussed with the patient. The patient verbalized understanding of and has agreed to the management plan. Patient is aware to call the clinic if they develop any new symptoms or if symptoms persist or worsen. Patient is aware when to return to the clinic for a follow-up visit. Patient educated on when it  is appropriate to go to the emergency department.   Robynn Pane, RN, FNP student  [name], [facility] [phone]

## 2019-10-10 ENCOUNTER — Ambulatory Visit: Payer: PPO | Admitting: Physician Assistant

## 2019-10-11 ENCOUNTER — Ambulatory Visit (INDEPENDENT_AMBULATORY_CARE_PROVIDER_SITE_OTHER): Payer: PPO | Admitting: Licensed Clinical Social Worker

## 2019-10-11 ENCOUNTER — Ambulatory Visit: Payer: PPO | Admitting: Family

## 2019-10-11 DIAGNOSIS — M5136 Other intervertebral disc degeneration, lumbar region: Secondary | ICD-10-CM

## 2019-10-11 DIAGNOSIS — I10 Essential (primary) hypertension: Secondary | ICD-10-CM

## 2019-10-11 DIAGNOSIS — I714 Abdominal aortic aneurysm, without rupture, unspecified: Secondary | ICD-10-CM

## 2019-10-11 DIAGNOSIS — E782 Mixed hyperlipidemia: Secondary | ICD-10-CM

## 2019-10-11 NOTE — Chronic Care Management (AMB) (Addendum)
Chronic Care Management    Clinical Social Work Follow Up Note  10/11/2019 Name: Lonnie Hurley MRN: [id number] DOB: Jul 30, 1940  Lonnie Hurley is a 79 y.o. year old male who is a primary care patient of Sharion Balloon, FNP. The CCM team was consulted for assistance with [name] .   Review of patient status, including review of consultants reports, other relevant assessments, and collaboration with appropriate care team members and the patient's provider was performed as part of comprehensive patient evaluation and provision of chronic care management services.    SDOH (Social Determinants of Health) assessments performed: Yes; risk for tobacco use; risk for depression    Office Visit from 04/14/2018 in [place]  PHQ-9 Total Score  4        Outpatient Encounter Medications as of 10/11/2019  Medication Sig   acetaminophen (TYLENOL) 500 MG tablet Take 500 mg by mouth every 6 (six) hours as needed.   Calcium Carb-Cholecalciferol (CALCIUM 600 + D PO) Take by mouth 2 (two) times daily.    Cholecalciferol (VITAMIN D3) 2000 UNITS TABS Take 1 tablet by mouth daily.    diclofenac Sodium (VOLTAREN) 1 % GEL Apply 2 g topically 4 (four) times daily.   ezetimibe (ZETIA) 10 MG tablet Take 1 tablet (10 mg total) by mouth daily.   gabapentin (NEURONTIN) 100 MG capsule Take 1 capsule (100 mg total) by mouth 3 (three) times daily.   Glucosamine-Chondroit-Vit C-Mn (GLUCOSAMINE 1500 COMPLEX PO) Take by mouth 2 (two) times daily.    glucose blood (ONE TOUCH ULTRA TEST) test strip Use as instructed   hydrochlorothiazide (HYDRODIURIL) 25 MG tablet Take 1 tablet (25 mg total) by mouth daily.   meloxicam (MOBIC) 7.5 MG tablet TAKE 1 TABLET BY MOUTH EVERY DAY (Patient taking differently: As needed)   Multiple Vitamins-Minerals (MULTIVITAMIN WITH MINERALS) tablet Take 1 tablet by mouth daily.   nystatin-triamcinolone (MYCOLOG II) cream Apply 1 application topically 2 (two)  times daily.   oxybutynin (DITROPAN-XL) 10 MG 24 hr tablet Take 10 mg by mouth daily.   polyethylene glycol powder (MIRALAX) 17 GM/SCOOP powder Take 17 g by mouth as needed for moderate constipation.   ramipril (ALTACE) 10 MG capsule Take 1 capsule (10 mg total) by mouth daily.   simvastatin (ZOCOR) 40 MG tablet Take 1 tablet (40 mg total) by mouth at bedtime.   tamsulosin (FLOMAX) 0.4 MG CAPS capsule Take 1 capsule (0.4 mg total) by mouth at bedtime.   No facility-administered encounter medications on file as of 10/11/2019.    Goals Addressed             This Visit's Progress    Client will talk with LCSW in next 30 days to discuss client completion of ADLs and client completion of daily activities (pt-stated)       Lonnie Hurley (see longitudinal plan of care for additional care plan information)  Current Barriers:  Pain issues in knees, legs of client with Chronic Diagnoses of AAA, HTN, HLD, DDD Arthritis issues  Clinical Social Work Clinical Goal(s):  LCSW to talk with client in next 30 days to discuss client completion of ADLs and client completion of daily activities  Interventions: Talked with client about social support system (has support from brother) Talked with client about upcoming client appointments  Talked with client about client completion of ADLs Talked with client about CCM program support Encouraged client to talk with [facility] about nursing support for client Talked with client about mobility  challenges and ambulation challenges for client Talked with client about meal provision for client (he and his brother cook meals and eat meals together)  Patient Self Care Activities:   Completes ADLs independently Takes medications as prescribed Attends scheduled medical appointments  Patient Self Care Deficits:  Pain issues Mobility challenges Arthritis  Initial goal documentation        Follow Up Plan: LCSW to call client in next 4 weeks to talk with  client about his completion of daily ADLs and about his completion of daily activities  [name].[name] MSW, LCSW Licensed Clinical Social Worker [facility] Care Management [phone]  I have reviewed and agree with the above  documentation.   [name], FNP

## 2019-10-11 NOTE — Patient Instructions (Addendum)
Licensed Clinical Social Worker Visit Information  Goals we discussed today:  Goals Addressed            This Visit's Progress   . Client will talk with LCSW in next 30 days to discuss client completion of ADLs and client completion of daily activities (pt-stated)       CARE PLAN ENTRY  Current Barriers:  . Pain issues in knees, legs of client with Chronic Diagnoses of AAA, HTN, HLD, DDD . Arthritis issues  Clinical Social Work Clinical Goal(s):  Marland Kitchen LCSW to talk with client in next 30 days to discuss client completion of ADLs and client completion of daily activities  Interventions: . Talked with client about social support system (has support from brother) . Talked with client about upcoming client appointments  . Talked with client about client completion of ADLs . Talked with client about CCM program support . Encouraged client to talk with [facility] about nursing support for client . Talked with client about mobility challenges and ambulation challenges for client . Talked with client about meal provision for client (he and his brother cook meals and eat meals together)  Patient Self Care Activities:   Completes ADLs independently Takes medications as prescribed Attends scheduled medical appointments  Patient Self Care Deficits:  . Pain issues . Mobility challenges . Arthritis  Initial goal documentation       Materials Provided: No  Follow Up Plan: LCSW to call client in next 4 weeks to talk with client about his completion of daily ADLs and about his completion of daily activities   The patient verbalized understanding of instructions provided today and declined a print copy of patient instruction materials.   [name].Andres Bantz MSW, LCSW Licensed Clinical Social Worker [facility] [phone]

## 2019-10-12 ENCOUNTER — Encounter: Payer: Self-pay | Admitting: Physical Therapy

## 2019-10-12 ENCOUNTER — Ambulatory Visit: Payer: PPO | Attending: Family | Admitting: Physical Therapy

## 2019-10-12 ENCOUNTER — Other Ambulatory Visit: Payer: Self-pay

## 2019-10-12 DIAGNOSIS — R262 Difficulty in walking, not elsewhere classified: Secondary | ICD-10-CM | POA: Diagnosis not present

## 2019-10-12 DIAGNOSIS — M6281 Muscle weakness (generalized): Secondary | ICD-10-CM | POA: Insufficient documentation

## 2019-10-12 NOTE — Therapy (Signed)
[facility] [place], [place], [address] Phone: [phone]   Fax:  [phone]  Physical Therapy Evaluation  Patient Details  Name: Lonnie Hurley MRN: [id number] Date of Birth: 1940-08-31 Referring Provider (PT): [name], FNP   Encounter Date: 10/12/2019  PT End of Session - 10/12/19 1925    Visit Number  1    Number of Visits  4    Date for PT Re-Evaluation  11/16/19    PT Start Time  0945    PT Stop Time  1030    PT Time Calculation (min)  45 min    Activity Tolerance  Patient tolerated treatment well    Behavior During Therapy  [facility] for tasks assessed/performed       Past Medical History:  Diagnosis Date  . BPH (benign prostatic hypertrophy)    Dr. Rosana Hoes / Dr. Joelyn Oms  - Urologist   . Cancer [facility])    lip-mole surgery  . Cataract    bilateral  . Colon polyps   . Constipation    occasional - miralax prn  . DDD (degenerative disc disease)   . Hearing loss    left ear, no hearing aids  . Hypercholesteremia   . Hyperlipidemia   . Hypertension   . Leukoplakia   . Microscopic hematuria    negative work up with Urology in the past.   . OAB (overactive bladder)    with past percutaneous tibial nerve stimulation therapy  . Thrombocytopenia ([place])   . Tinnitus   . Tobacco abuse   . Vitamin B 12 deficiency   . Vitamin D deficiency     Past Surgical History:  Procedure Laterality Date  . CATARACT EXTRACTION  2007   right eye  . CATARACT EXTRACTION Left 03/21/15  . COLONOSCOPY  02/28/2014  . HERNIA REPAIR  1980  . KNEE SURGERY  02-28-2008   replaced inside right knee  . Left Knee Repair  11-10-2006  . NOSE SURGERY    . POLYPECTOMY     colon polyps  . SKIN BIOPSY  10-08-2006   left ear    There were no vitals filed for this visit.   Subjective Assessment - 10/12/19 1916    Subjective  COVID-19 screening performed upon arrival.Patient arrives to physical therapy with bilateral LE weakness R>L with a  request for exercises to help improve his movement. Patient reports he needs to take baby steps and take his time to  walk and to perform ADLs. Patient reports difficulties with sit to stand transfers especially if the chair does not have arm rests. Patient's goals are to improve movement and learn exercises for HEP.    Pertinent History  HTN, DDD    Limitations  Standing;Walking;House hold activities    Patient Stated Goals  learn exercises    Currently in Pain?  Yes    Pain Score  4     Pain Location  Leg    Pain Orientation  Right;Left    Pain Descriptors / Indicators  Aching    Pain Type  Chronic pain    Pain Frequency  Constant    Aggravating Factors   no rest    Pain Relieving Factors  rest    Effect of Pain on Daily Activities  "none if I rest"         [facility] PT Assessment - 10/12/19 0001      Assessment   Medical Diagnosis  Pain in both lower extremities    Referring  Provider (PT)  [name], FNP    Onset Date/Surgical Date  --   ongoing   Next MD Visit  10/17/2019    Prior Therapy  yes      Precautions   Precautions  None      Restrictions   Weight Bearing Restrictions  No      Balance Screen   Has the patient fallen in the past 6 months  No    Has the patient had a decrease in activity level because of a fear of falling?   No    Is the patient reluctant to leave their home because of a fear of falling?   No      Home Environment   Living Environment  Private residence      ROM / Strength   AROM / PROM / Strength  Strength      Strength   Overall Strength  Deficits    Strength Assessment Site  Knee;Hip    Right/Left Hip  Right;Left    Right Hip Flexion  3-/5    Right Hip ABduction  3/5   in sitting   Right Hip ADduction  3/5   in sitting   Left Hip Flexion  3/5    Left Hip ABduction  3/5   in sitting   Left Hip ADduction  3/5   in sitting   Right/Left Knee  Right;Left    Right Knee Flexion  3+/5    Right Knee Extension  3+/5    Left Knee Flexion   4/5    Left Knee Extension  4/5      Transfers   Five time sit to stand comments   25 seconds no UE support      Ambulation/Gait   Gait Pattern  Step-through pattern;Decreased stance time - right;Decreased stride length;Decreased step length - left;Decreased weight shift to right;Antalgic;Wide base of support                Objective measurements completed on examination: See above findings.              PT Education - 10/12/19 2007    Education Details  HR/TR. Marching. hip ABD, pillow squeeze, seated clamshell    Person(s) Educated  Patient    Methods  Explanation;Demonstration;Handout    Comprehension  Verbalized understanding;Returned demonstration          PT Long Term Goals - 10/12/19 2035      PT LONG TERM GOAL #1   Title  Patient will be independent with HEP    Time  4    Period  Weeks    Status  New             Plan - 10/12/19 2022    Clinical Impression Statement  Patient is a 79 year old male who presents to physical therapy with bilateral LE pain, gait abnormalities, and R > L LE weakness. Patient's 5x sit to stand time of 25 seconds categorizes him as a fall risk with decreased functional LE strength. Patient and PT reviewed HEP at length, discussed energy conservation, and discussed importance of finding balance between activities and rest. Patient would like to attempt HEP then return for follow up visit to address any questions or concerns. Patient would benefit from skilled physical therapy to address deficits and goals.    Personal Factors and Comorbidities  Age;Comorbidity 2    Comorbidities  HTN, DDD    Examination-Activity Limitations  Transfers    Stability/Clinical  Decision Making  Stable/Uncomplicated    Clinical Decision Making  Low    Rehab Potential  Good    PT Frequency  1x / week    PT Duration  2 weeks    PT Treatment/Interventions  ADLs/Self Care Home Management;Gait training;Stair training;Functional mobility  training;Therapeutic activities;Therapeutic exercise;Balance training;Neuromuscular re-education;Manual techniques;Passive range of motion;Patient/family education    PT Next Visit Plan  Review HEP as needed, provide advanced HEP    PT Home Exercise Plan  see patient education section    Consulted and Agree with Plan of Care  Patient       Patient will benefit from skilled therapeutic intervention in order to improve the following deficits and impairments:  Decreased activity tolerance, Decreased strength, Difficulty walking, Pain  Visit Diagnosis: Muscle weakness (generalized)  Difficulty in walking, not elsewhere classified     Problem List Patient Active Problem List   Diagnosis Date Noted  . Haglund's deformity of right heel 06/27/2019  . [facility] spotted fever) 07/02/2018  . Chronic pain of right knee 04/14/2018  . Heel pain, chronic, right 03/19/2018  . Foot pain, bilateral 03/19/2018  . Kidney stone 03/19/2018  . Arthritis of back 08/25/2016  . AAA (abdominal aortic aneurysm) without rupture ([place]) 07/13/2016  . Lumbar spondylosis with myelopathy 07/13/2016  . Pre-diabetes 09/18/2015  . Metabolic syndrome [id number]  . Vitamin B 12 deficiency 12/14/2014  . Hereditary and idiopathic peripheral neuropathy 10/17/2014  . Vitamin D deficiency 06/29/2014  . BPH (benign prostatic hyperplasia) 06/29/2014  . Hypertension 11/17/2012  . Hyperlipidemia 11/17/2012  . DDD (degenerative disc disease), lumbar 11/17/2012  . Thrombocytopenia ([place])     Gabriela Eves, PT, DPT 10/12/2019, 8:59 PM  [facility] [address] [place], [place], [address] Phone: [phone]   Fax:  [phone]  Name: Harshil Selbe MRN: [id number] Date of Birth: [birth year]

## 2019-10-17 ENCOUNTER — Ambulatory Visit (INDEPENDENT_AMBULATORY_CARE_PROVIDER_SITE_OTHER): Payer: PPO | Admitting: *Deleted

## 2019-10-17 VITALS — Ht 65.0 in | Wt 259.7 lb

## 2019-10-17 DIAGNOSIS — Z0000 Encounter for general adult medical examination without abnormal findings: Secondary | ICD-10-CM | POA: Diagnosis not present

## 2019-10-17 NOTE — Progress Notes (Addendum)
Lonnie Hurley VISIT  10/17/2019  Telephone Visit Disclaimer This Medicare AWV was conducted by telephone due to national recommendations for restrictions regarding the COVID-19 Pandemic (e.g. social distancing).  I verified, using two identifiers, that I am speaking with Lonnie Hurley or their authorized healthcare agent. I discussed the limitations, risks, security, and privacy concerns of performing an evaluation and management service by telephone and the potential availability of an in-person appointment in the future. The patient expressed understanding and agreed to proceed.   Subjective:  Lonnie Hurley is a 79 y.o. male patient of Hawks, Theador Hawthorne, FNP who had a Medicare Annual Wellness Visit today via telephone. [name] is Retired and lives with their brother. he has 0 children. he reports that he is not socially active and does not interact with friends/family regularly. he is not physically active and enjoys gardening.  Patient Care Team: Sharion Balloon, FNP as PCP - General (Family Medicine) Nobie Putnam, MD (Hematology and Oncology) Irine Seal, MD as Attending Physician (Urology) Williford, Layne Benton, MD as Consulting Physician (Dermatology) Warden Fillers, MD as Consulting Physician (Ophthalmology) Erline Levine, MD as Consulting Physician (Neurosurgery) Ilean China, RN as Registered Nurse Shea Evans, [name], LCSW as Social Worker (Licensed Clinical Social Worker)  Advanced Directives 10/17/2019 10/12/2019 10/15/2018 04/30/2018 02/19/2017 08/26/2016 08/25/2016  Does Patient Have a Medical Advance Directive? Yes Yes No No Yes Yes No  Type of Advance Directive Living will;Healthcare Power of [place];Living will -  Does patient want to make changes to medical advance directive? No - Patient declined - - - - No - Patient declined -  Copy of [place] in Chart? No - copy requested - - - No - copy requested No  - copy requested -  Would patient like information on creating a medical advance directive? - - No - Patient declined No - Patient declined - No - Patient declined Yes (MAU/Ambulatory/Procedural Areas - Information given)    Hospital Utilization Over the Past 12 Months: # of hospitalizations or ER visits: 0 # of surgeries: 0  Review of Systems    Patient reports that his overall health is unchanged compared to last year.  History obtained from chart review and the patient General ROS: negative Musculoskeletal ROS: positive for - pain in knee - bilateral  Patient Reported Readings (BP, Pulse, CBG, Weight, etc) none  Pain Assessment Pain : 0-10 Pain Score: 3  Pain Type: Chronic pain Pain Location: Knee Pain Orientation: Left, Right Pain Descriptors / Indicators: Aching Pain Onset: More than a month ago Pain Frequency: Constant Pain Relieving Factors: Gabapentin, Meloxicam, Leg Exercises Effect of Pain on Daily Activities: Trouble walking for long period of times  Pain Relieving Factors: Gabapentin, Meloxicam, Leg Exercises  Current Medications & Allergies (verified) Allergies as of 10/17/2019       Reactions   Penicillins Other (See Comments)   unknown   Aspirin Other (See Comments)   Causes Blood in urine   Crestor [rosuvastatin Calcium] Other (See Comments)   Myalgia, "joint problem"   Gabapentin Other (See Comments)   Blurry vision   Hydrocodone Other (See Comments)   Constipation    Lyrica [pregabalin]    Blurry vision        Medication List        Accurate as of Oct 17, 2019 12:19 PM. If you have any questions, ask your nurse or doctor.  acetaminophen 500 MG tablet Commonly known as: TYLENOL Take 500 mg by mouth every 6 (six) hours as needed.   CALCIUM 600 + D PO Take by mouth 2 (two) times daily.   diclofenac Sodium 1 % Gel Commonly known as: Voltaren Apply 2 g topically 4 (four) times daily.   ezetimibe 10 MG tablet Commonly known  as: ZETIA Take 1 tablet (10 mg total) by mouth daily.   gabapentin 100 MG capsule Commonly known as: NEURONTIN Take 1 capsule (100 mg total) by mouth 3 (three) times daily.   GLUCOSAMINE 1500 COMPLEX PO Take by mouth 2 (two) times daily.   glucose blood test strip Commonly known as: ONE TOUCH ULTRA TEST Use as instructed   hydrochlorothiazide 25 MG tablet Commonly known as: HYDRODIURIL Take 1 tablet (25 mg total) by mouth daily.   meloxicam 7.5 MG tablet Commonly known as: MOBIC TAKE 1 TABLET BY MOUTH EVERY DAY What changed:  how much to take how to take this when to take this additional instructions   multivitamin with minerals tablet Take 1 tablet by mouth daily.   nystatin-triamcinolone cream Commonly known as: MYCOLOG II Apply 1 application topically 2 (two) times daily.   oxybutynin 10 MG 24 hr tablet Commonly known as: DITROPAN-XL Take 10 mg by mouth daily.   polyethylene glycol powder 17 GM/SCOOP powder Commonly known as: MiraLax Take 17 g by mouth as needed for moderate constipation.   ramipril 10 MG capsule Commonly known as: ALTACE Take 1 capsule (10 mg total) by mouth daily.   simvastatin 40 MG tablet Commonly known as: ZOCOR Take 1 tablet (40 mg total) by mouth at bedtime.   tamsulosin 0.4 MG Caps capsule Commonly known as: Flomax Take 1 capsule (0.4 mg total) by mouth at bedtime.   Vitamin D3 50 MCG (2000 UT) Tabs Take 1 tablet by mouth daily.        History (reviewed): Past Medical History:  Diagnosis Date   BPH (benign prostatic hypertrophy)    Dr. Rosana Hoes / Dr. Joelyn Oms  - Urologist    [facility])    lip-mole surgery   Cataract    bilateral   Colon polyps    Constipation    occasional - miralax prn   DDD (degenerative disc disease)    Hearing loss    left ear, no hearing aids   Hypercholesteremia    Hyperlipidemia    Hypertension    Leukoplakia    Microscopic hematuria    negative work up with Urology in the past.    OAB  (overactive bladder)    with past percutaneous tibial nerve stimulation therapy   Thrombocytopenia (HCC)    Tinnitus    Tobacco abuse    Vitamin B 12 deficiency    Vitamin D deficiency    Past Surgical History:  Procedure Laterality Date   CATARACT EXTRACTION  2007   right Hurley   CATARACT EXTRACTION Left 03/21/15   COLONOSCOPY  02/28/2014   [place]   KNEE SURGERY  02-28-2008   replaced inside right knee   Left Knee Repair  11-10-2006   NOSE SURGERY     POLYPECTOMY     colon polyps   SKIN BIOPSY  10-08-2006   left ear   Family History  Problem Relation Age of Onset   Heart failure Father    Glaucoma Mother    Osteoporosis Brother    Heart disease Brother    Hypertension Brother    Hyperlipidemia Brother  Diabetes Brother    Colon cancer Neg Hx    Stomach cancer Neg Hx    Rectal cancer Neg Hx    Social History   Socioeconomic History   Marital status: Single    Spouse name: Not on file   Number of children: 0   Years of education: 10   Highest education level: 10th grade  Occupational History   Occupation: Retired    Comment: retired Immunologist  Tobacco Use   Smoking status: Former Smoker    Packs/day: 1.00    Years: 50.00    Pack years: 50.00    Types: Cigarettes    Quit date: 08/14/2009    Years since quitting: 10.1   Smokeless tobacco: Never Used  Substance and Sexual Activity   Alcohol use: No   Drug use: No   Sexual activity: Not Currently  Other Topics Concern   Not on file  Social History Narrative   Retired Immunologist   Lives at home with Brother    Social Determinants of Health   Financial Resource Strain: Low Risk    Difficulty of Paying Living Expenses: Not hard at all  Food Insecurity: No Food Insecurity   Worried About Charity fundraiser in the Last Year: Never true   Arboriculturist in the Last Year: Never true  Transportation Needs: No Transportation Needs   Lack of Transportation  (Medical): No   Lack of Transportation (Non-Medical): No  Physical Activity: Inactive   Days of Exercise per Week: 0 days   Minutes of Exercise per Session: 0 min  Stress: No Stress Concern Present   Feeling of Stress : Not at all  Social Connections: Somewhat Isolated   Frequency of Communication with Friends and Family: More than three times a week   Frequency of Social Gatherings with Friends and Family: Once a week   Attends Religious Services: More than 4 times per year   Active Member of [employer] or Organizations: No   Attends Archivist Meetings: Never   Marital Status: Never married    Activities of Daily Living In your present state of health, do you have any difficulty performing the following activities: 10/17/2019  Hearing? Y  Vision? N  Difficulty concentrating or making decisions? Y  Comment Difficulty with Short-term memory  Walking or climbing stairs? Y  Comment Bilateral knee pain, uses can when needed  Dressing or bathing? N  Doing errands, shopping? N  Preparing Food and eating ? N  Using the Toilet? N  In the past six months, have you accidently leaked urine? Y  Do you have problems with loss of bowel control? N  Managing your Medications? N  Managing your Finances? N  Housekeeping or managing your Housekeeping? N  Some recent data might be hidden    Patient Education/ Literacy How often do you need to have someone help you when you read instructions, pamphlets, or other written materials from your doctor or pharmacy?: 1 - Never What is the last grade level you completed in school?: 10th Grade  Exercise Current Exercise Habits: The patient does not participate in regular exercise at present, Exercise limited by: orthopedic condition(s)  Diet Patient reports consuming 3 meals a day and 2-3 snack(s) a day Patient reports that his primary diet is: Regular Patient reports that she does have regular access to food.   Depression Screen PHQ 2/9  Scores 10/17/2019 10/06/2019 10/06/2019 09/12/2019 09/01/2019 07/11/2019 06/27/2019  PHQ - 2  Score 0 0 0 0 0 0 0  PHQ- 9 Score - - - - - - -     Fall Risk Fall Risk  10/17/2019 10/06/2019 09/12/2019 09/01/2019 07/11/2019  Falls in the past year? 0 0 0 0 0  Number falls in past yr: 0 - - - -  Injury with Fall? 0 - - - -  Risk for fall due to : History of fall(s);Impaired balance/gait;Impaired mobility - - - -  Follow up Falls evaluation completed - - - -     Objective:  Lonnie Hurley seemed alert and oriented and he participated appropriately during our telephone visit.  Blood Pressure Weight BMI  BP Readings from Last 3 Encounters:  10/06/19 108/67  09/12/19 107/62  09/01/19 124/76   Wt Readings from Last 3 Encounters:  10/17/19 259 lb 11.2 oz (117.8 kg)  10/06/19 259 lb 12.8 oz (117.8 kg)  09/12/19 255 lb 6.4 oz (115.8 kg)   BMI Readings from Last 1 Encounters:  10/17/19 43.22 kg/m    *Unable to obtain current vital signs, weight, and BMI due to telephone visit type  Hearing/Vision  Hollice Espy did  seem to have difficulty with hearing/understanding during the telephone conversation Reports that he has had a formal Hurley exam by an Hurley care professional within the past year Reports that he has had a formal hearing evaluation within the past year *Unable to fully assess hearing and vision during telephone visit type  Cognitive Function: 6CIT Screen 10/17/2019 10/15/2018  What Year? 0 points 0 points  What month? 0 points 0 points  What time? 0 points 0 points  Count back from 20 0 points 0 points  Months in reverse 0 points 0 points  Repeat phrase 0 points 0 points  Total Score 0 0   (Normal:0-7, Significant for Dysfunction: >8)  Normal Cognitive Function Screening: Yes   Immunization & Health Maintenance Record Immunization History  Administered Date(s) Administered   Fluad Quad(high Dose 65+) 03/07/2019   Influenza, High Dose Seasonal PF 03/13/2016, 03/17/2017, 03/15/2018    Influenza,inj,Quad PF,6+ Mos 03/14/2014, 03/16/2015   PFIZER SARS-COV-2 Vaccination 08/09/2019, 08/30/2019   Pneumococcal Conjugate-13 12/29/2013   Pneumococcal Polysaccharide-23 03/16/2009   Rabies, IM 01/10/2013, 01/17/2013, 01/24/2013, 01/31/2013   Tdap 01/10/2013   Zoster 03/17/2011   Zoster Recombinat (Shingrix) 09/01/2017, 11/12/2017    Health Maintenance  Topic Date Due   INFLUENZA VACCINE  01/15/2020   TETANUS/TDAP  01/11/2023   COLONOSCOPY  03/24/2024   COVID-19 Vaccine  Completed   PNA vac Low Risk Adult  Completed       Assessment  This is a routine wellness examination for [employer].  Health Maintenance: Due or Overdue There are no preventive care reminders to display for this patient.  Damarques Sorkin does not need a referral for Community Assistance: Care Management:   no Social Work:    no Prescription Assistance:  no Nutrition/Diabetes Education:  no   Plan:  Personalized Goals Goals Addressed             This Visit's Progress    Exercise 150 min/wk Moderate Activity       10/17/2019 AWV Goal: Exercise for General Health  Patient will verbalize understanding of the benefits of increased physical activity: Exercising regularly is important. It will improve your overall fitness, flexibility, and endurance. Regular exercise also will improve your overall health. It can help you control your weight, reduce stress, and improve your bone density. Over the next year, patient will increase  physical activity as tolerated with a goal of at least 150 minutes of moderate physical activity per week.  You can tell that you are exercising at a moderate intensity if your heart starts beating faster and you start breathing faster but can still hold a conversation. Moderate-intensity exercise ideas include: Walking 1 mile (1.6 km) in about 15 minutes Biking Hiking Golfing Dancing Water aerobics Patient will verbalize understanding of everyday activities that  increase physical activity by providing examples like the following: Yard work, such as: Sales promotion account executive Gardening Washing windows or floors Patient will be able to explain general safety guidelines for exercising:  Before you start a new exercise program, talk with your health care provider. Do not exercise so much that you hurt yourself, feel dizzy, or get very short of breath. Wear comfortable clothes and wear shoes with good support. Drink plenty of water while you exercise to prevent dehydration or heat stroke. Work out until your breathing and your heartbeat get faster.      Have 3 meals a day       10/17/2019 AWV Goal: Improved Nutrition/Diet  Patient will verbalize understanding that diet plays an important role in overall health and that a poor diet is a risk factor for many chronic medical conditions.  Over the next year, patient will improve self management of their diet by incorporating improved meal pattern, decreased fat intake, more consistent meal timing, fewer sweetened foods & beverages, increased physical activity, improved protein intake, adequate fluid intake (at least 6 cups of fluid per day), watch portion sizes/amount of food eaten at one time, and eat 6 small meals per day. Patient will utilize available community resources to help with food acquisition if needed (ex: food pantries, [address], etc) Patient will work with nutrition specialist if a referral was made        Personalized Health Maintenance & Screening Recommendations  Advance Directive:  Has advance.  [place] copy provided.  Copy requested  Lung Cancer Screening Recommended: no (Low Dose CT Chest recommended if Age 58-80 years, 30 pack-year currently smoking OR have quit w/in past 15 years) Hepatitis C Screening recommended: no HIV Screening recommended: no  Advanced Directives: Written information was not prepared per  patient's request.  Referrals & Orders No orders of the defined types were placed in this encounter.   Follow-up Plan Follow-up with Sharion Balloon, FNP as planned Patient to bring in copy of Advance Directive to be scanned into chart    I have personally reviewed and noted the following in the patient's chart:   Medical and social history Use of alcohol, tobacco or illicit drugs  Current medications and supplements Functional ability and status Nutritional status Physical activity Advanced directives List of other physicians Hospitalizations, surgeries, and ER visits in previous 12 months Vitals Screenings to include cognitive, depression, and falls Referrals and appointments  In addition, I have reviewed and discussed with Lonnie Hurley certain preventive protocols, quality metrics, and best practice recommendations. A written personalized care plan for preventive services as well as general preventive health recommendations is available and can be mailed to the patient at his request.      Wardell Heath, LPN  [id number]   AVS printed and mailed to patient   I have reviewed and agree with the above AVS documentation.   [name], FNP

## 2019-10-17 NOTE — Patient Instructions (Signed)
[place] Maintenance Summary and Written Plan of Care  Mr. Menner ,  Thank you for allowing me to perform your Medicare Annual Wellness Visit and for your ongoing commitment to your health.   Health Maintenance & Immunization History Health Maintenance  Topic Date Due  . INFLUENZA VACCINE  01/15/2020  . TETANUS/TDAP  01/11/2023  . COLONOSCOPY  03/24/2024  . COVID-19 Vaccine  Completed  . PNA vac Low Risk Adult  Completed   Immunization History  Administered Date(s) Administered  . Fluad Quad(high Dose 65+) 03/07/2019  . Influenza, High Dose Seasonal PF 03/13/2016, 03/17/2017, 03/15/2018  . Influenza,inj,Quad PF,6+ Mos 03/14/2014, 03/16/2015  . PFIZER SARS-COV-2 Vaccination 08/09/2019, 08/30/2019  . Pneumococcal Conjugate-13 12/29/2013  . Pneumococcal Polysaccharide-23 03/16/2009  . Rabies, IM 01/10/2013, 01/17/2013, 01/24/2013, 01/31/2013  . Tdap 01/10/2013  . Zoster 03/17/2011  . Zoster Recombinat (Shingrix) 09/01/2017, 11/12/2017    These are the patient goals that we discussed: Goals Addressed            This Visit's Progress   . Exercise 150 min/wk Moderate Activity       10/17/2019 AWV Goal: Exercise for General Health   Patient will verbalize understanding of the benefits of increased physical activity:  Exercising regularly is important. It will improve your overall fitness, flexibility, and endurance.  Regular exercise also will improve your overall health. It can help you control your weight, reduce stress, and improve your bone density.  Over the next year, patient will increase physical activity as tolerated with a goal of at least 150 minutes of moderate physical activity per week.   You can tell that you are exercising at a moderate intensity if your heart starts beating faster and you start breathing faster but can still hold a conversation.  Moderate-intensity exercise ideas include:  Walking 1 mile (1.6 km) in about 15  minutes  Biking  Hiking  Golfing  Dancing  Water aerobics  Patient will verbalize understanding of everyday activities that increase physical activity by providing examples like the following: ? Yard work, such as: ? Pushing a Conservation officer, nature ? Raking and bagging leaves ? Washing your car ? Pushing a stroller ? Shoveling snow ? Gardening ? Washing windows or floors  Patient will be able to explain general safety guidelines for exercising:   Before you start a new exercise program, talk with your health care provider.  Do not exercise so much that you hurt yourself, feel dizzy, or get very short of breath.  Wear comfortable clothes and wear shoes with good support.  Drink plenty of water while you exercise to prevent dehydration or heat stroke.  Work out until your breathing and your heartbeat get faster.     . Have 3 meals a day       10/17/2019 AWV Goal: Improved Nutrition/Diet  . Patient will verbalize understanding that diet plays an important role in overall health and that a poor diet is a risk factor for many chronic medical conditions.  . Over the next year, patient will improve self management of their diet by incorporating improved meal pattern, decreased fat intake, more consistent meal timing, fewer sweetened foods & beverages, increased physical activity, improved protein intake, adequate fluid intake (at least 6 cups of fluid per day), watch portion sizes/amount of food eaten at one time, and eat 6 small meals per day. . Patient will utilize available community resources to help with food acquisition if needed (ex: food pantries, [address], etc) .  Patient will work with nutrition specialist if a referral was made         This is a list of Health Maintenance Items that are overdue or due now: There are no preventive care reminders to display for this patient.   Orders/Referrals Placed Today: No orders of the defined types were placed in this  encounter.  (Contact our referral department at [phone] if you have not spoken with someone about your referral appointment within the next 5 days)    Follow-up Plan Follow up as scheduled with [name], FNP Bring in copy of Advance Directive to be scanned into chart

## 2019-10-18 ENCOUNTER — Ambulatory Visit (INDEPENDENT_AMBULATORY_CARE_PROVIDER_SITE_OTHER): Payer: PPO | Admitting: Family

## 2019-10-18 ENCOUNTER — Other Ambulatory Visit: Payer: Self-pay

## 2019-10-18 ENCOUNTER — Encounter: Payer: Self-pay | Admitting: Family

## 2019-10-18 VITALS — BP 112/66 | HR 78 | Temp 96.7°F | Ht 65.0 in | Wt 258.0 lb

## 2019-10-18 DIAGNOSIS — R0602 Shortness of breath: Secondary | ICD-10-CM

## 2019-10-18 DIAGNOSIS — I10 Essential (primary) hypertension: Secondary | ICD-10-CM | POA: Diagnosis not present

## 2019-10-18 DIAGNOSIS — E559 Vitamin D deficiency, unspecified: Secondary | ICD-10-CM | POA: Diagnosis not present

## 2019-10-18 DIAGNOSIS — E1169 Type 2 diabetes mellitus with other specified complication: Secondary | ICD-10-CM | POA: Diagnosis not present

## 2019-10-18 DIAGNOSIS — E782 Mixed hyperlipidemia: Secondary | ICD-10-CM | POA: Diagnosis not present

## 2019-10-18 DIAGNOSIS — N401 Enlarged prostate with lower urinary tract symptoms: Secondary | ICD-10-CM | POA: Diagnosis not present

## 2019-10-18 DIAGNOSIS — M5136 Other intervertebral disc degeneration, lumbar region: Secondary | ICD-10-CM | POA: Diagnosis not present

## 2019-10-18 DIAGNOSIS — E538 Deficiency of other specified B group vitamins: Secondary | ICD-10-CM | POA: Diagnosis not present

## 2019-10-18 DIAGNOSIS — E8881 Metabolic syndrome: Secondary | ICD-10-CM

## 2019-10-18 LAB — BAYER DCA HB A1C WAIVED: HB A1C (BAYER DCA - WAIVED): 7.5 % — ABNORMAL HIGH (ref <7.0)

## 2019-10-18 NOTE — Progress Notes (Signed)
Subjective:    Patient ID: Lonnie Hurley, male    DOB: 1940/11/29, 79 y.o.   MRN: [id number]  Chief Complaint  Patient presents with  . Medical Management of Chronic Issues   Pt presents to the office today to establish care. He is followed by Urologists every 3 months for BPH.  Hypertension This is a chronic problem. The current episode started more than 1 year ago. The problem has been resolved since onset. The problem is controlled. Associated symptoms include malaise/fatigue and shortness of breath. Pertinent negatives include no blurred vision, chest pain or peripheral edema. Risk factors for coronary artery disease include dyslipidemia, obesity, male gender and sedentary lifestyle. The current treatment provides moderate improvement. Hypertensive end-organ damage includes CAD/MI. There is no history of heart failure.  Benign Prostatic Hypertrophy This is a chronic problem. The current episode started more than 1 year ago. The problem has been waxing and waning since onset. Irritative symptoms include nocturia (2) and urgency. Past treatments include tamsulosin.  Hyperlipidemia This is a chronic problem. The current episode started more than 1 year ago. The problem is controlled. Recent lipid tests were reviewed and are normal. Exacerbating diseases include obesity. Associated symptoms include shortness of breath. Pertinent negatives include no chest pain. Current antihyperlipidemic treatment includes statins. The current treatment provides moderate improvement of lipids. Risk factors for coronary artery disease include dyslipidemia, male sex, hypertension and a sedentary lifestyle.  Diabetes He presents for his follow-up diabetic visit. He has type 2 diabetes mellitus. His disease course has been stable. There are no hypoglycemic associated symptoms. Pertinent negatives for diabetes include no blurred vision, no chest pain and no foot paresthesias. There are no hypoglycemic complications.  Symptoms are stable. Risk factors for coronary artery disease include dyslipidemia, diabetes mellitus, hypertension, male sex and sedentary lifestyle. He is following a generally unhealthy diet. (Does not check BS at home)  Shortness of Breath This is a recurrent problem. The current episode started more than 1 month ago. Pertinent negatives include no chest pain or ear pain. The symptoms are aggravated by exercise. There is no history of a heart failure.      Review of Systems  Constitutional: Positive for malaise/fatigue.  HENT: Negative for ear pain.   Eyes: Negative for blurred vision.  Respiratory: Positive for shortness of breath.   Cardiovascular: Negative for chest pain.  Genitourinary: Positive for nocturia (2) and urgency.  All other systems reviewed and are negative.      Objective:   Physical Exam Vitals reviewed.  Constitutional:      General: He is not in acute distress.    Appearance: He is well-developed. He is obese.  HENT:     Head: Normocephalic.     Right Ear: Tympanic membrane normal.     Left Ear: Tympanic membrane normal.  Eyes:     General:        Right eye: No discharge.        Left eye: No discharge.     Pupils: Pupils are equal, round, and reactive to light.  Neck:     Thyroid: No thyromegaly.  Cardiovascular:     Rate and Rhythm: Normal rate and regular rhythm.     Heart sounds: Normal heart sounds. No murmur.  Pulmonary:     Effort: Pulmonary effort is normal. No respiratory distress.     Breath sounds: Decreased breath sounds present. No wheezing.     Comments: SOB on exertion  Abdominal:     General:  Bowel sounds are normal. There is no distension.     Palpations: Abdomen is soft.     Tenderness: There is no abdominal tenderness.  Genitourinary:    Comments: Smells of urine Musculoskeletal:        General: No tenderness. Normal range of motion.     Cervical back: Normal range of motion and neck supple.  Skin:    General: Skin is warm  and dry.     Findings: No erythema or rash.  Neurological:     Mental Status: He is alert and oriented to person, place, and time.     Cranial Nerves: No cranial nerve deficit.     Deep Tendon Reflexes: Reflexes are normal and symmetric.  Psychiatric:        Behavior: Behavior normal.        Thought Content: Thought content normal.        Judgment: Judgment normal.      BP 112/66   Pulse 78   Temp (!) 96.7 F (35.9 C) (Temporal)   Ht 5' 5" (1.651 m)   Wt 258 lb (117 kg)   SpO2 92%   BMI 42.93 kg/m       Assessment & Plan:  Lonnie Hurley comes in today with chief complaint of Medical Management of Chronic Issues   Diagnosis and orders addressed:  1. Essential hypertension - CMP14+EGFR - CBC with Differential/Platelet  2. DDD (degenerative disc disease), lumbar - CMP14+EGFR - CBC with Differential/Platelet  3. Benign prostatic hyperplasia with lower urinary tract symptoms, symptom details unspecified - CMP14+EGFR - CBC with Differential/Platelet  4. Metabolic syndrome - CMP14+EGFR - CBC with Differential/Platelet  5. Vitamin D deficiency - CMP14+EGFR - CBC with Differential/Platelet - VITAMIN D 25 Hydroxy (Vit-D Deficiency, Fractures)  6. Vitamin B 12 deficiency - CMP14+EGFR - CBC with Differential/Platelet - Vitamin B12  7. Mixed hyperlipidemia - CMP14+EGFR - CBC with Differential/Platelet - Lipid panel  8. Type 2 diabetes mellitus with other specified complication, without long-term current use of insulin (HCC) - CMP14+EGFR - CBC with Differential/Platelet - Bayer DCA Hb A1c Waived - Microalbumin / creatinine urine ratio - Ambulatory referral to Cardiology  9. SOB (shortness of breath) on exertion - EKG 12-Lead - Ambulatory referral to Cardiology   Labs pending Health Maintenance reviewed Diet and exercise encouraged  Follow up plan: 6 months    Christy Hawks, FNP  

## 2019-10-18 NOTE — Patient Instructions (Signed)
Shortness of Breath, Adult Shortness of breath is when a person has trouble breathing enough air or when a person feels like she or he is having trouble breathing in enough air. Shortness of breath could be a sign of a medical problem. Follow these instructions at home:   Pay attention to any changes in your symptoms.  Do not use any products that contain nicotine or tobacco, such as cigarettes, e-cigarettes, and chewing tobacco.  Do not smoke. Smoking is a common cause of shortness of breath. If you need help quitting, ask your health care provider.  Avoid things that can irritate your airways, such as: ? Mold. ? Dust. ? Air pollution. ? Chemical fumes. ? Things that can cause allergy symptoms (allergens), if you have allergies.  Keep your living space clean and free of mold and dust.  Rest as needed. Slowly return to your usual activities.  Take over-the-counter and prescription medicines only as told by your health care provider. This includes oxygen therapy and inhaled medicines.  Keep all follow-up visits as told by your health care provider. This is important. Contact a health care provider if:  Your condition does not improve as soon as expected.  You have a hard time doing your normal activities, even after you rest.  You have new symptoms. Get help right away if:  Your shortness of breath gets worse.  You have shortness of breath when you are resting.  You feel light-headed or you faint.  You have a cough that is not controlled with medicines.  You cough up blood.  You have pain with breathing.  You have pain in your chest, arms, shoulders, or abdomen.  You have a fever.  You cannot walk up stairs or exercise the way that you normally do. These symptoms may represent a serious problem that is an emergency. Do not wait to see if the symptoms will go away. Get medical help right away. Call your local emergency services (911 in the [place] Do not drive yourself  to the hospital. Summary  Shortness of breath is when a person has trouble breathing enough air. It can be a sign of a medical problem.  Avoid things that irritate your lungs, such as smoking, pollution, mold, and dust.  Pay attention to changes in your symptoms and contact your health care provider if you have a hard time completing daily activities because of shortness of breath. This information is not intended to replace advice given to you by your health care provider. Make sure you discuss any questions you have with your health care provider. Document Revised: 11/02/2017 Document Reviewed: 11/02/2017 Elsevier Patient Education  2020 Elsevier Inc.  

## 2019-10-19 ENCOUNTER — Ambulatory Visit: Payer: PPO | Attending: Family | Admitting: Physical Therapy

## 2019-10-19 ENCOUNTER — Other Ambulatory Visit: Payer: Self-pay

## 2019-10-19 DIAGNOSIS — R262 Difficulty in walking, not elsewhere classified: Secondary | ICD-10-CM | POA: Insufficient documentation

## 2019-10-19 DIAGNOSIS — M6281 Muscle weakness (generalized): Secondary | ICD-10-CM | POA: Diagnosis not present

## 2019-10-19 LAB — LIPID PANEL
Chol/HDL Ratio: 3.7 ratio (ref 0.0–5.0)
Cholesterol, Total: 106 mg/dL (ref 100–199)
HDL: 29 mg/dL — ABNORMAL LOW (ref >39)
LDL Chol Calc (NIH): 36 mg/dL (ref 0–99)
Triglycerides: 266 mg/dL — ABNORMAL HIGH (ref 0–149)
VLDL Cholesterol Cal: 41 mg/dL — ABNORMAL HIGH (ref 5–40)

## 2019-10-19 LAB — CBC WITH DIFFERENTIAL/PLATELET
Basophils Absolute: 0.1 10*3/uL (ref 0.0–0.2)
Basos: 1 %
EOS (ABSOLUTE): 0.3 10*3/uL (ref 0.0–0.4)
Eos: 3 %
Hematocrit: 45.4 % (ref 37.5–51.0)
Hemoglobin: 15.1 g/dL (ref 13.0–17.7)
Immature Grans (Abs): 0.1 10*3/uL (ref 0.0–0.1)
Immature Granulocytes: 1 %
Lymphocytes Absolute: 3 10*3/uL (ref 0.7–3.1)
Lymphs: 33 %
MCH: 29.7 pg (ref 26.6–33.0)
MCHC: 33.3 g/dL (ref 31.5–35.7)
MCV: 89 fL (ref 79–97)
Monocytes Absolute: 1.1 10*3/uL — ABNORMAL HIGH (ref 0.1–0.9)
Monocytes: 12 %
Neutrophils Absolute: 4.5 10*3/uL (ref 1.4–7.0)
Neutrophils: 50 %
Platelets: 176 10*3/uL (ref 150–450)
RBC: 5.08 x10E6/uL (ref 4.14–5.80)
RDW: 13 % (ref 11.6–15.4)
WBC: 9 10*3/uL (ref 3.4–10.8)

## 2019-10-19 LAB — CMP14+EGFR
ALT: 24 IU/L (ref 0–44)
AST: 22 IU/L (ref 0–40)
Albumin/Globulin Ratio: 1.6 (ref 1.2–2.2)
Albumin: 3.9 g/dL (ref 3.7–4.7)
Alkaline Phosphatase: 58 IU/L (ref 39–117)
BUN/Creatinine Ratio: 17 (ref 10–24)
BUN: 15 mg/dL (ref 8–27)
Bilirubin Total: 0.5 mg/dL (ref 0.0–1.2)
CO2: 27 mmol/L (ref 20–29)
Calcium: 9.1 mg/dL (ref 8.6–10.2)
Chloride: 103 mmol/L (ref 96–106)
Creatinine, Ser: 0.88 mg/dL (ref 0.76–1.27)
GFR calc Af Amer: 96 mL/min/{1.73_m2} (ref >59)
GFR calc non Af Amer: 83 mL/min/{1.73_m2} (ref >59)
Globulin, Total: 2.5 g/dL (ref 1.5–4.5)
Glucose: 116 mg/dL — ABNORMAL HIGH (ref 65–99)
Potassium: 3.8 mmol/L (ref 3.5–5.2)
Sodium: 144 mmol/L (ref 134–144)
Total Protein: 6.4 g/dL (ref 6.0–8.5)

## 2019-10-19 LAB — MICROALBUMIN / CREATININE URINE RATIO
Creatinine, Urine: 95.4 mg/dL
Microalb/Creat Ratio: 13 mg/g creat (ref 0–29)
Microalbumin, Urine: 12.5 ug/mL

## 2019-10-19 LAB — VITAMIN D 25 HYDROXY (VIT D DEFICIENCY, FRACTURES): Vit D, 25-Hydroxy: 27.5 ng/mL — ABNORMAL LOW (ref 30.0–100.0)

## 2019-10-19 LAB — VITAMIN B12: Vitamin B-12: 138 pg/mL — ABNORMAL LOW (ref 232–1245)

## 2019-10-19 NOTE — Therapy (Signed)
[facility] [place], [place], [address] Phone: [phone]   Fax:  [phone]  Physical Therapy Treatment PHYSICAL THERAPY DISCHARGE SUMMARY  Visits from Start of Care: 2  Current functional level related to goals / functional outcomes: See below   Remaining deficits: See goals   Education / Equipment: HEP Plan: Patient agrees to discharge.  Patient goals were met. Patient is being discharged due to the patient's request.  ?????  Gabriela Eves, PT, DPT   Patient Details  Name: Lonnie Hurley MRN: [id number] Date of Birth: [birth year] Referring Provider (PT): [name], FNP   Encounter Date: 10/19/2019  PT End of Session - 10/19/19 0955    Visit Number  2    Number of Visits  4    Date for PT Re-Evaluation  11/16/19    PT Start Time  0946    PT Stop Time  1028    PT Time Calculation (min)  42 min    Activity Tolerance  Patient tolerated treatment well    Behavior During Therapy  [facility] for tasks assessed/performed       Past Medical History:  Diagnosis Date  . BPH (benign prostatic hypertrophy)    Dr. Rosana Hoes / Dr. Joelyn Oms  - Urologist   . Cancer [facility])    lip-mole surgery  . Cataract    bilateral  . Colon polyps   . Constipation    occasional - miralax prn  . DDD (degenerative disc disease)   . Hearing loss    left ear, no hearing aids  . Hypercholesteremia   . Hyperlipidemia   . Hypertension   . Leukoplakia   . Microscopic hematuria    negative work up with Urology in the past.   . OAB (overactive bladder)    with past percutaneous tibial nerve stimulation therapy  . Thrombocytopenia ([place])   . Tinnitus   . Tobacco abuse   . Vitamin B 12 deficiency   . Vitamin D deficiency     Past Surgical History:  Procedure Laterality Date  . CATARACT EXTRACTION  2007   right eye  . CATARACT EXTRACTION Left 03/21/15  . COLONOSCOPY  02/28/2014  . HERNIA REPAIR  1980  . KNEE SURGERY  02-28-2008    replaced inside right knee  . Left Knee Repair  11-10-2006  . NOSE SURGERY    . POLYPECTOMY     colon polyps  . SKIN BIOPSY  10-08-2006   left ear    There were no vitals filed for this visit.  Subjective Assessment - 10/19/19 1442    Subjective  COVID-19 screening performed upon arrival. Patient reports doing well and is ready for discharge with advanced HEP    Pertinent History  HTN, DDD    Limitations  Standing;Walking;House hold activities    Patient Stated Goals  learn exercises    Currently in Pain?  Yes   "sore but not too bad"        [facility] PT Assessment - 10/19/19 0001      Assessment   Medical Diagnosis  Pain in both lower extremities    Referring Provider (PT)  [name], FNP    Next MD Visit  10/17/2019    Prior Therapy  yes      Precautions   Precautions  None                   [facility] Adult PT Treatment/Exercise - 10/19/19 0001      Exercises  Exercises  Knee/Hip      Knee/Hip Exercises: Stretches   Other Knee/Hip Stretches  gastroc stretch 3x30" bilaterally      Knee/Hip Exercises: Aerobic   Nustep  level 1 x10 mins      Knee/Hip Exercises: Standing   Heel Raises  Both;2 sets;10 reps;2 seconds   external rotation   Knee Flexion  AROM;Both;2 sets;10 reps    Hip Extension  AROM;Both;2 sets;10 reps    Functional Squat  2 sets;10 reps                  PT Long Term Goals - 10/19/19 1432      PT LONG TERM GOAL #1   Title  Patient will be independent with HEP    Time  4    Period  Weeks    Status  Achieved            Plan - 10/19/19 1259    Clinical Impression Statement  Patinet arrived doing well but with soreness in bilateral LEs. Patient guided through TEs and was instructed proper form and technique. Patient reported understanding with all TEs and is confident with performing exercises for home program. Patient to be discharged today per patient request    Personal Factors and Comorbidities  Age;Comorbidity 2     Comorbidities  HTN, DDD    Examination-Activity Limitations  Transfers    Stability/Clinical Decision Making  Stable/Uncomplicated    Clinical Decision Making  Low    Rehab Potential  Good    PT Duration  2 weeks    PT Treatment/Interventions  ADLs/Self Care Home Management;Gait training;Stair training;Functional mobility training;Therapeutic activities;Therapeutic exercise;Balance training;Neuromuscular re-education;Manual techniques;Passive range of motion;Patient/family education    PT Next Visit Plan  DC    PT Home Exercise Plan  see patient education section    Consulted and Agree with Plan of Care  Patient       Patient will benefit from skilled therapeutic intervention in order to improve the following deficits and impairments:  Decreased activity tolerance, Decreased strength, Difficulty walking, Pain  Visit Diagnosis: Muscle weakness (generalized)  Difficulty in walking, not elsewhere classified     Problem List Patient Active Problem List   Diagnosis Date Noted  . Haglund's deformity of right heel 06/27/2019  . [facility] spotted fever) 07/02/2018  . Chronic pain of right knee 04/14/2018  . Heel pain, chronic, right 03/19/2018  . Foot pain, bilateral 03/19/2018  . Kidney stone 03/19/2018  . Arthritis of back 08/25/2016  . AAA (abdominal aortic aneurysm) without rupture ([place]) 07/13/2016  . Lumbar spondylosis with myelopathy 07/13/2016  . Diabetes mellitus ([place]) 09/18/2015  . Metabolic syndrome 30/02/2329  . Vitamin B 12 deficiency 12/14/2014  . Hereditary and idiopathic peripheral neuropathy 10/17/2014  . Vitamin D deficiency 06/29/2014  . BPH (benign prostatic hyperplasia) 06/29/2014  . Hypertension 11/17/2012  . Hyperlipidemia 11/17/2012  . DDD (degenerative disc disease), lumbar 11/17/2012  . Thrombocytopenia ([place])     Gabriela Eves, PT, DPT 10/19/2019, 2:46 PM  [facility] [place], [place], [address] Phone: [phone]   Fax:  [phone]  Name: Lonnie Hurley MRN: [id number] Date of Birth: [birth year]

## 2019-10-21 ENCOUNTER — Other Ambulatory Visit: Payer: Self-pay | Admitting: Family

## 2019-10-25 ENCOUNTER — Telehealth: Payer: Self-pay | Admitting: *Deleted

## 2019-10-25 ENCOUNTER — Ambulatory Visit (INDEPENDENT_AMBULATORY_CARE_PROVIDER_SITE_OTHER): Payer: PPO | Admitting: *Deleted

## 2019-10-25 ENCOUNTER — Other Ambulatory Visit: Payer: Self-pay

## 2019-10-25 DIAGNOSIS — E538 Deficiency of other specified B group vitamins: Secondary | ICD-10-CM

## 2019-10-25 MED ORDER — VITAMIN D (ERGOCALCIFEROL) 1.25 MG (50000 UNIT) PO CAPS
50000.0000 [IU] | ORAL_CAPSULE | ORAL | 3 refills | Status: DC
Start: 2019-10-25 — End: 2021-10-21

## 2019-10-25 MED ORDER — CYANOCOBALAMIN 1000 MCG/ML IJ SOLN
1000.0000 ug | Freq: Once | INTRAMUSCULAR | Status: AC
  Administered 2019-10-25: 1000 ug via INTRAMUSCULAR

## 2019-10-25 NOTE — Telephone Encounter (Signed)
Please write out complete B12 order

## 2019-11-01 ENCOUNTER — Ambulatory Visit (INDEPENDENT_AMBULATORY_CARE_PROVIDER_SITE_OTHER): Payer: PPO | Admitting: *Deleted

## 2019-11-01 ENCOUNTER — Other Ambulatory Visit: Payer: Self-pay

## 2019-11-01 DIAGNOSIS — E538 Deficiency of other specified B group vitamins: Secondary | ICD-10-CM | POA: Diagnosis not present

## 2019-11-01 MED ORDER — CYANOCOBALAMIN 1000 MCG/ML IJ SOLN
1000.0000 ug | Freq: Once | INTRAMUSCULAR | Status: AC
  Administered 2019-11-01: 1000 ug via INTRAMUSCULAR

## 2019-11-01 NOTE — Progress Notes (Signed)
Patient in today for B12 injection. 1000 mg placed IM in Left deltoid. Patient tolerated well.

## 2019-11-03 ENCOUNTER — Ambulatory Visit (INDEPENDENT_AMBULATORY_CARE_PROVIDER_SITE_OTHER): Payer: PPO | Admitting: Licensed Clinical Social Worker

## 2019-11-03 DIAGNOSIS — I10 Essential (primary) hypertension: Secondary | ICD-10-CM | POA: Diagnosis not present

## 2019-11-03 DIAGNOSIS — E782 Mixed hyperlipidemia: Secondary | ICD-10-CM | POA: Diagnosis not present

## 2019-11-03 DIAGNOSIS — M5136 Other intervertebral disc degeneration, lumbar region: Secondary | ICD-10-CM

## 2019-11-03 DIAGNOSIS — I714 Abdominal aortic aneurysm, without rupture, unspecified: Secondary | ICD-10-CM

## 2019-11-03 NOTE — Patient Instructions (Addendum)
Licensed Clinical Social Worker Visit Information  Goals we discussed today:  Goals    . Client will talk with LCSW in next 30 days to discuss client completion of ADLs and client completion of daily activities (pt-stated)     CARE PLAN ENTRY  Current Barriers:  . Pain issues in knees, legs of client with Chronic Diagnoses of AAA, HTN, HLD, DDD . Arthritis issues  Clinical Social Work Clinical Goal(s):  Marland Kitchen LCSW to talk with client in next 30 days to discuss client completion of ADLs and client completion of daily activities  Interventions: . Talked with client about social support system (has support from brother) . Talked with client about upcoming client appointments  . Talked with client about client completion of ADLs . Talked with client about CCM program support . Encouraged client to talk with [facility] about nursing support for client . Talked with client about mobility challenges and ambulation challenges for client . Talked with client about meal provision for client (he and his brother cook meals and eat meals together)  Talked with client about his receiving prescribed injections at [facility]  Talked with client about pain issues of client  Talked with client about sleeping challenges of client  Talked with client about hearing challenges of client  Patient Self Care Activities:   Completes ADLs independently Takes medications as prescribed Attends scheduled medical appointments  Patient Self Care Deficits:  . Pain issues . Mobility challenges . Arthritis  Initial goal documentation     Materials Provided: No  Follow Up Plan: LCSW to call client in next 4 weeks to talk with client about his completion of daily ADLs and about his completion of daily activities  The patient verbalized understanding of instructions provided today and declined a print copy of patient instruction materials.   [name].Adalbert Alberto MSW, LCSW Licensed Clinical Social Worker [facility] Care Management [phone]

## 2019-11-03 NOTE — Chronic Care Management (AMB) (Addendum)
Chronic Care Management    Clinical Social Work Follow Up Note  11/03/2019 Name: Lonnie Hurley MRN: [id number] DOB: [birth year]  Lonnie Hurley is a 79 y.o. year old male who is a primary care patient of Sharion Balloon, FNP. The CCM team was consulted for assistance with [name] .   Review of patient status, including review of consultants reports, other relevant assessments, and collaboration with appropriate care team members and the patient's provider was performed as part of comprehensive patient evaluation and provision of chronic care management services.    SDOH (Social Determinants of Health) assessments performed: Yes;risk for social isolation; risk for tobacco use; risk for physical inactivity    Office Visit from 04/14/2018 in [facility]  PHQ-9 Total Score  4        Outpatient Encounter Medications as of 11/03/2019  Medication Sig   acetaminophen (TYLENOL) 500 MG tablet Take 500 mg by mouth every 6 (six) hours as needed.   Calcium Carb-Cholecalciferol (CALCIUM 600 + D PO) Take by mouth 2 (two) times daily.    Cholecalciferol (VITAMIN D3) 2000 UNITS TABS Take 1 tablet by mouth daily.    diclofenac Sodium (VOLTAREN) 1 % GEL Apply 2 g topically 4 (four) times daily.   ezetimibe (ZETIA) 10 MG tablet Take 1 tablet (10 mg total) by mouth daily.   gabapentin (NEURONTIN) 100 MG capsule Take 1 capsule (100 mg total) by mouth 3 (three) times daily.   Glucosamine-Chondroit-Vit C-Mn (GLUCOSAMINE 1500 COMPLEX PO) Take by mouth 2 (two) times daily.    glucose blood (ONE TOUCH ULTRA TEST) test strip Use as instructed   hydrochlorothiazide (HYDRODIURIL) 25 MG tablet Take 1 tablet (25 mg total) by mouth daily.   meloxicam (MOBIC) 7.5 MG tablet TAKE 1 TABLET BY MOUTH EVERY DAY (Patient taking differently: As needed)   Multiple Vitamins-Minerals (MULTIVITAMIN WITH MINERALS) tablet Take 1 tablet by mouth daily.   oxybutynin (DITROPAN-XL) 10 MG 24 hr tablet  Take 10 mg by mouth daily.   polyethylene glycol powder (MIRALAX) 17 GM/SCOOP powder Take 17 g by mouth as needed for moderate constipation.   ramipril (ALTACE) 10 MG capsule Take 1 capsule (10 mg total) by mouth daily.   simvastatin (ZOCOR) 40 MG tablet Take 1 tablet (40 mg total) by mouth at bedtime.   tamsulosin (FLOMAX) 0.4 MG CAPS capsule Take 1 capsule (0.4 mg total) by mouth at bedtime.   Vitamin D, Ergocalciferol, (DRISDOL) 1.25 MG (50000 UNIT) CAPS capsule Take 1 capsule (50,000 Units total) by mouth every 7 (seven) days.   No facility-administered encounter medications on file as of 11/03/2019.    Goals      Client will talk with LCSW in next 30 days to discuss client completion of ADLs and client completion of daily activities (pt-stated)     [place] (see longitudinal plan of care for additional care plan information)  Current Barriers:  Pain issues in knees, legs of client with Chronic Diagnoses of AAA, HTN, HLD, DDD Arthritis issues  Clinical Social Work Clinical Goal(s):  LCSW to talk with client in next 30 days to discuss client completion of ADLs and client completion of daily activities  Interventions: Talked with client about social support system (has support from brother) Talked with client about upcoming client appointments  Talked with client about client completion of ADLs Talked with client about CCM program support Encouraged client to talk with [facility] about nursing support for client Talked with client about mobility challenges and  ambulation challenges for client Talked with client about meal provision for client (he and his brother cook meals and eat meals together) Talked with client about his receiving prescribed injections at [facility] Talked with client about pain issues of client Talked with client about sleeping challenges of client Talked with client about hearing challenges of client  Patient Self Care Activities:   Completes ADLs independently  Takes medications as prescribed Attends scheduled medical appointments  Patient Self Care Deficits:  Pain issues Mobility challenges Arthritis  Initial goal documentation     Follow Up Plan: LCSW to call client in next 4 weeks to talk with client about his completion of daily ADLs and about his completion of daily activities  [name], LCSW Licensed Clinical Social Worker [facility] Care [facility] [phone]  I have reviewed and agree with the above documentation.   [name], FNP

## 2019-11-08 ENCOUNTER — Ambulatory Visit (INDEPENDENT_AMBULATORY_CARE_PROVIDER_SITE_OTHER): Payer: PPO

## 2019-11-08 ENCOUNTER — Other Ambulatory Visit: Payer: Self-pay

## 2019-11-08 DIAGNOSIS — E538 Deficiency of other specified B group vitamins: Secondary | ICD-10-CM | POA: Diagnosis not present

## 2019-11-08 MED ORDER — CYANOCOBALAMIN 1000 MCG/ML IJ SOLN
1000.0000 ug | Freq: Once | INTRAMUSCULAR | Status: AC
  Administered 2019-11-08: 1000 ug via INTRAMUSCULAR

## 2019-11-08 NOTE — Progress Notes (Signed)
B12 injection given to right deltoid.  Patient tolerated well. 

## 2019-11-10 ENCOUNTER — Other Ambulatory Visit: Payer: Self-pay

## 2019-11-10 ENCOUNTER — Ambulatory Visit (INDEPENDENT_AMBULATORY_CARE_PROVIDER_SITE_OTHER): Payer: PPO

## 2019-11-10 ENCOUNTER — Ambulatory Visit (INDEPENDENT_AMBULATORY_CARE_PROVIDER_SITE_OTHER): Payer: PPO | Admitting: Family Medicine

## 2019-11-10 ENCOUNTER — Encounter: Payer: Self-pay | Admitting: Family Medicine

## 2019-11-10 VITALS — BP 104/74 | HR 68 | Temp 97.8°F | Ht 65.0 in | Wt 252.0 lb

## 2019-11-10 DIAGNOSIS — R0789 Other chest pain: Secondary | ICD-10-CM | POA: Diagnosis not present

## 2019-11-10 DIAGNOSIS — J984 Other disorders of lung: Secondary | ICD-10-CM | POA: Diagnosis not present

## 2019-11-10 MED ORDER — MELOXICAM 7.5 MG PO TABS: 7.5000 mg | ORAL_TABLET | Freq: Every day | ORAL | 0 refills | Status: DC

## 2019-11-10 NOTE — Progress Notes (Signed)
BP 104/74   Pulse 68   Temp 97.8 F (36.6 C)   Ht 5\' 5"  (1.651 m)   Wt 252 lb (114.3 kg)   SpO2 95%   BMI 41.93 kg/m    Subjective:   Patient ID: Lonnie Hurley, male    DOB: [birth year], 79 y.o.   MRN: [id number]  HPI: Lonnie Hurley is a 79 y.o. male presenting on 11/10/2019 for Chest wall pain   HPI Patient comes in today complaining of chest wall pain that he feels like his upper abdomen or lower chest wall that started about 2 weeks ago and has come and gone. He says it hurts more with movement or with pressure on that side. He denies any shortness of breath or wheezing or fevers or chills or constipation or diarrhea. He has had kidney stones before but this is higher and different than his kidney stone pain. He does have a prescription that he uses for arthritis and helps a side but not the chest wall as much. He does use gabapentin 3 times daily.  Relevant past medical, surgical, family and social history reviewed and updated as indicated. Interim medical history since our last visit reviewed. Allergies and medications reviewed and updated.  Review of Systems  Constitutional: Negative for chills and fever.  HENT: Negative for congestion, sinus pressure and sinus pain.   Respiratory: Negative for chest tightness, shortness of breath and wheezing.   Cardiovascular: Positive for chest pain. Negative for palpitations and leg swelling.  Gastrointestinal: Negative for abdominal pain.  Musculoskeletal: Negative for back pain and gait problem.  Skin: Negative for rash.  All other systems reviewed and are negative.   Per HPI unless specifically indicated above      Objective:   BP 104/74   Pulse 68   Temp 97.8 F (36.6 C)   Ht 5\' 5"  (1.651 m)   Wt 252 lb (114.3 kg)   SpO2 95%   BMI 41.93 kg/m   Wt Readings from Last 3 Encounters:  11/10/19 252 lb (114.3 kg)  10/18/19 258 lb (117 kg)  10/17/19 259 lb 11.2 oz (117.8 kg)    Physical Exam Vitals and nursing note  reviewed.  Constitutional:      General: He is not in acute distress.    Appearance: He is well-developed. He is not diaphoretic.  Eyes:     General: No scleral icterus.       Right eye: No discharge.     Conjunctiva/sclera: Conjunctivae normal.     Pupils: Pupils are equal, round, and reactive to light.  Neck:     Thyroid: No thyromegaly.  Cardiovascular:     Rate and Rhythm: Normal rate and regular rhythm.     Heart sounds: Normal heart sounds. No murmur.  Pulmonary:     Effort: Pulmonary effort is normal. No respiratory distress.     Breath sounds: Normal breath sounds. No wheezing.  Chest:     Chest wall: Tenderness (Point tenderness over right side of chest wall over the eighth rib at the mid sternal line. Reproducible) present.  Abdominal:     General: Abdomen is flat. Bowel sounds are normal. There is no distension.     Tenderness: There is no abdominal tenderness. There is no right CVA tenderness, left CVA tenderness, guarding or rebound.     Hernia: No hernia is present.  Musculoskeletal:        General: Normal range of motion.     Cervical back: Neck supple.  Lymphadenopathy:     Cervical: No cervical adenopathy.  Skin:    General: Skin is warm and dry.     Findings: No rash.  Neurological:     Mental Status: He is alert and oriented to person, place, and time.     Coordination: Coordination normal.  Psychiatric:        Behavior: Behavior normal.     Chest x-ray: No acute bony abnormalities noted on chest x-ray, no pneumonias, await final read from radiology.  Assessment & Plan:   Problem List Items Addressed This Visit    None    Visit Diagnoses    Chest wall pain    -  Primary   Relevant Medications   meloxicam (MOBIC) 7.5 MG tablet   Other Relevant Orders   DG Chest 2 View      Patient has reproducible chest wall pain, chest x-ray normal, recommended anti-inflammatories, likely muscular in origin. Follow up plan: Return if symptoms worsen or fail  to improve.  Counseling provided for all of the vaccine components Orders Placed This Encounter  Procedures  . DG Chest 2 View    Caryl Pina, MD [facility] 11/10/2019, 2:55 PM

## 2019-11-15 ENCOUNTER — Other Ambulatory Visit: Payer: Self-pay

## 2019-11-15 ENCOUNTER — Ambulatory Visit (INDEPENDENT_AMBULATORY_CARE_PROVIDER_SITE_OTHER): Payer: PPO | Admitting: *Deleted

## 2019-11-15 DIAGNOSIS — E538 Deficiency of other specified B group vitamins: Secondary | ICD-10-CM | POA: Diagnosis not present

## 2019-11-15 MED ORDER — CYANOCOBALAMIN 1000 MCG/ML IJ SOLN
1000.0000 ug | INTRAMUSCULAR | Status: AC
  Administered 2019-11-15 – 2024-07-22 (×52): 1000 ug via INTRAMUSCULAR

## 2019-11-15 NOTE — Progress Notes (Signed)
Patient in today for B12 injection. 1000 mcg given IM in left deltoid. Patient tolerated well.  

## 2019-11-21 ENCOUNTER — Ambulatory Visit (INDEPENDENT_AMBULATORY_CARE_PROVIDER_SITE_OTHER): Payer: PPO | Admitting: Family Medicine

## 2019-11-21 ENCOUNTER — Encounter: Payer: Self-pay | Admitting: Family Medicine

## 2019-11-21 ENCOUNTER — Telehealth: Payer: Self-pay | Admitting: Family

## 2019-11-21 DIAGNOSIS — K130 Diseases of lips: Secondary | ICD-10-CM | POA: Diagnosis not present

## 2019-11-21 NOTE — Progress Notes (Signed)
Virtual Visit via telephone Note  I connected with Lonnie Hurley on 11/21/19 at 1108 by telephone and verified that I am speaking with the correct person using two identifiers. Lonnie Hurley is currently located at home and no other people are currently with her during visit. The provider, Fransisca Kaufmann Pandora Mccrackin, MD is located in their office at time of visit.  Call ended at 1115  I discussed the limitations, risks, security and privacy concerns of performing an evaluation and management service by telephone and the availability of in person appointments. I also discussed with the patient that there may be a patient responsible charge related to this service. The patient expressed understanding and agreed to proceed.   History and Present Illness: Patient is calling in for pain on the right hand side of his lip.  He has burning pain where he had a surgery before.  He has noticed skin changes on his lip.  He used a lip cream and carmex and they are helping. Since he started gabapentin he has a spot on his lower lip and it is increasing in size.   No diagnosis found.  Outpatient Encounter Medications as of 11/21/2019  Medication Sig  . acetaminophen (TYLENOL) 500 MG tablet Take 500 mg by mouth every 6 (six) hours as needed.  . Calcium Carb-Cholecalciferol (CALCIUM 600 + D PO) Take by mouth 2 (two) times daily.   . Cholecalciferol (VITAMIN D3) 2000 UNITS TABS Take 1 tablet by mouth daily.   . diclofenac Sodium (VOLTAREN) 1 % GEL Apply 2 g topically 4 (four) times daily.  Marland Kitchen ezetimibe (ZETIA) 10 MG tablet Take 1 tablet (10 mg total) by mouth daily.  Marland Kitchen gabapentin (NEURONTIN) 100 MG capsule Take 1 capsule (100 mg total) by mouth 3 (three) times daily.  . Glucosamine-Chondroit-Vit C-Mn (GLUCOSAMINE 1500 COMPLEX PO) Take by mouth 2 (two) times daily.   Marland Kitchen glucose blood (ONE TOUCH ULTRA TEST) test strip Use as instructed  . hydrochlorothiazide (HYDRODIURIL) 25 MG tablet Take 1 tablet (25 mg total) by  mouth daily.  . meloxicam (MOBIC) 7.5 MG tablet Take 1 tablet (7.5 mg total) by mouth daily.  . Multiple Vitamins-Minerals (MULTIVITAMIN WITH MINERALS) tablet Take 1 tablet by mouth daily.  Marland Kitchen oxybutynin (DITROPAN-XL) 10 MG 24 hr tablet Take 10 mg by mouth daily.  . polyethylene glycol powder (MIRALAX) 17 GM/SCOOP powder Take 17 g by mouth as needed for moderate constipation.  . ramipril (ALTACE) 10 MG capsule Take 1 capsule (10 mg total) by mouth daily.  . simvastatin (ZOCOR) 40 MG tablet Take 1 tablet (40 mg total) by mouth at bedtime.  . tamsulosin (FLOMAX) 0.4 MG CAPS capsule Take 1 capsule (0.4 mg total) by mouth at bedtime.  . Vitamin D, Ergocalciferol, (DRISDOL) 1.25 MG (50000 UNIT) CAPS capsule Take 1 capsule (50,000 Units total) by mouth every 7 (seven) days.   Facility-Administered Encounter Medications as of 11/21/2019  Medication  . cyanocobalamin ((VITAMIN B-12)) injection 1,000 mcg    Review of Systems  Constitutional: Negative for chills and fever.  HENT: Positive for mouth sores.   Eyes: Negative for visual disturbance.  Respiratory: Negative for shortness of breath and wheezing.   Cardiovascular: Negative for chest pain and leg swelling.  Musculoskeletal: Negative for arthralgias, back pain and gait problem.  Skin: Negative for color change and rash.  All other systems reviewed and are negative.   Observations/Objective: Patient sounds comfortable and in no acute distress  Assessment and Plan: Problem List Items Addressed This  Visit    None    Visit Diagnoses    Sore of lower lip    -  Primary      Wean down off gabapentin for 2 per day for 1 week and 1 per day for 1 week and then stop. Let us know if sore is still there after he fully weans himself off of the gabapentin.  Did not think that based on what he is saying that it is from the gabapentin but he thinks it only started when he started the gabapentin and has worsened since his had it.  He does have a  previous Mohs surgery in that area a long time ago, some may be suspicious towards some kind of lesion returning.  If not improved after weaning off gabapentin then he needs to be seen in person Follow up plan: Return if symptoms worsen or fail to improve.     I discussed the assessment and treatment plan with the patient. The patient was provided an opportunity to ask questions and all were answered. The patient agreed with the plan and demonstrated an understanding of the instructions.   The patient was advised to call back or seek an in-person evaluation if the symptoms worsen or if the condition fails to improve as anticipated.  The above assessment and management plan was discussed with the patient. The patient verbalized understanding of and has agreed to the management plan. Patient is aware to call the clinic if symptoms persist or worsen. Patient is aware when to return to the clinic for a follow-up visit. Patient educated on when it is appropriate to go to the emergency department.    I provided 7 minutes of non-face-to-face time during this encounter.    Worthy Rancher, MD

## 2019-11-21 NOTE — Telephone Encounter (Signed)
error 

## 2019-11-23 ENCOUNTER — Ambulatory Visit: Payer: PPO | Admitting: Podiatry

## 2019-11-24 DIAGNOSIS — R3121 Asymptomatic microscopic hematuria: Secondary | ICD-10-CM | POA: Diagnosis not present

## 2019-11-24 DIAGNOSIS — N401 Enlarged prostate with lower urinary tract symptoms: Secondary | ICD-10-CM | POA: Diagnosis not present

## 2019-11-24 DIAGNOSIS — R351 Nocturia: Secondary | ICD-10-CM | POA: Diagnosis not present

## 2019-11-24 DIAGNOSIS — N3941 Urge incontinence: Secondary | ICD-10-CM | POA: Diagnosis not present

## 2019-11-29 ENCOUNTER — Encounter: Payer: Self-pay | Admitting: Cardiology

## 2019-11-29 NOTE — Progress Notes (Signed)
Cardiology Office Note   Date:  11/30/2019   ID:  Lonnie Hurley, DOB [birth year], MRN [id number]  PCP:  Lonnie Balloon, FNP  Cardiologist:   No primary care provider on file. Referring:  Lonnie Balloon, FNP  Chief Complaint  Patient presents with  . Shortness of Breath      History of Present Illness: Lonnie Hurley is a 79 y.o. male who is referred by Lonnie Balloon, FNP for evaluation of shortness of breath.  He has no history of coronary disease although I do see a CT from 2019 and he was noted to have some coronary calcium and aortic atherosclerosis.  He has cardiovascular risk factors.  He is morbidly obese.  He has some joint problems but he says he does walk and does some weed eating.  His legs give out on him which is the biggest same.  He has some pain down from his buttocks.  He has some pain in his joints.  He does get short of breath with exertion which has been a chronic problem.  He is not describing any resting shortness of breath, PND or orthopnea.  He has not had any palpitations, presyncope or syncope.  He has had no PND or orthopnea.  He has had slow and steady weight gain.   Past Medical History:  Diagnosis Date  . BPH (benign prostatic hypertrophy)    Dr. Rosana Hurley / Dr. Joelyn Hurley  - Urologist   . Cancer [facility])    lip-mole surgery  . Cataract    bilateral  . Colon polyps   . Constipation    occasional - miralax prn  . DDD (degenerative disc disease)   . Hearing loss    left ear, no hearing aids  . Hyperlipidemia   . Hypertension   . Leukoplakia   . Microscopic hematuria    negative work up with Urology in the past.   . OAB (overactive bladder)    with past percutaneous tibial nerve stimulation therapy  . Thrombocytopenia ([place])   . Tinnitus   . Tobacco abuse   . Vitamin B 12 deficiency   . Vitamin D deficiency     Past Surgical History:  Procedure Laterality Date  . CATARACT EXTRACTION  2007   right eye  . CATARACT EXTRACTION Left 03/21/15  .  COLONOSCOPY  02/28/2014  . HERNIA REPAIR  1980  . KNEE SURGERY  02-28-2008   replaced inside right knee  . Left Knee Repair  11-10-2006  . NOSE SURGERY    . POLYPECTOMY     colon polyps  . SKIN BIOPSY  10-08-2006   left ear     Current Outpatient Medications  Medication Sig Dispense Refill  . acetaminophen (TYLENOL) 500 MG tablet Take 500 mg by mouth every 6 (six) hours as needed.    . Calcium Carb-Cholecalciferol (CALCIUM 600 + D PO) Take by mouth 2 (two) times daily.     . Cholecalciferol (VITAMIN D3) 2000 UNITS TABS Take 1 tablet by mouth daily.     . diclofenac Sodium (VOLTAREN) 1 % GEL Apply 2 g topically 4 (four) times daily. 350 g 1  . ezetimibe (ZETIA) 10 MG tablet Take 1 tablet (10 mg total) by mouth daily. 90 tablet 3  . gabapentin (NEURONTIN) 100 MG capsule Take 1 capsule (100 mg total) by mouth 3 (three) times daily. 90 capsule 3  . Glucosamine-Chondroit-Vit C-Mn (GLUCOSAMINE 1500 COMPLEX PO) Take by mouth 2 (two) times daily.     Marland Kitchen  hydrochlorothiazide (HYDRODIURIL) 25 MG tablet Take 1 tablet (25 mg total) by mouth daily. 90 tablet 1  . meloxicam (MOBIC) 7.5 MG tablet Take 1 tablet (7.5 mg total) by mouth daily. 20 tablet 0  . Multiple Vitamins-Minerals (MULTIVITAMIN WITH MINERALS) tablet Take 1 tablet by mouth daily.    Marland Kitchen oxybutynin (DITROPAN-XL) 10 MG 24 hr tablet Take 10 mg by mouth daily.    . polyethylene glycol powder (MIRALAX) 17 GM/SCOOP powder Take 17 g by mouth as needed for moderate constipation. 1700 g 2  . ramipril (ALTACE) 10 MG capsule Take 1 capsule (10 mg total) by mouth daily. 90 capsule 3  . simvastatin (ZOCOR) 40 MG tablet Take 1 tablet (40 mg total) by mouth at bedtime. 90 tablet 3  . tamsulosin (FLOMAX) 0.4 MG CAPS capsule Take 1 capsule (0.4 mg total) by mouth at bedtime. 90 capsule 3  . Vitamin D, Ergocalciferol, (DRISDOL) 1.25 MG (50000 UNIT) CAPS capsule Take 1 capsule (50,000 Units total) by mouth every 7 (seven) days. 12 capsule 3  . glucose blood  (ONE TOUCH ULTRA TEST) test strip Use as instructed 50 each 11   Current Facility-Administered Medications  Medication Dose Route Frequency Provider Last Rate Last Admin  . cyanocobalamin ((VITAMIN B-12)) injection 1,000 mcg  1,000 mcg Intramuscular Q30 days Lonnie Hurley A, FNP   1,000 mcg at 11/15/19 7001    Allergies:   Penicillins, Aspirin, Crestor [rosuvastatin calcium], Gabapentin, Hydrocodone, and Lyrica [pregabalin]    Social History:  The patient  reports that he quit smoking about 10 years ago. His smoking use included cigarettes. He has a 50.00 pack-year smoking history. He has never used smokeless tobacco. He reports that he does not drink alcohol and does not use drugs.   Family History:  The patient's family history includes Diabetes in his brother; Glaucoma in his mother; Heart disease (age of onset: 102) in his brother; Heart failure in his father; Hyperlipidemia in his brother; Hypertension in his brother; Osteoporosis in his brother.    ROS:  Please see the history of present illness.   Otherwise, review of systems are positive for none.   All other systems are reviewed and negative.    PHYSICAL EXAM: VS:  BP 102/68   Pulse 76   Ht 5\' 6"  (1.676 m)   Wt 255 lb (115.7 kg)   BMI 41.16 kg/m  , BMI Body mass index is 41.16 kg/m. GENERAL:  Well appearing HEENT:  Pupils equal round and reactive, fundi not visualized, oral mucosa unremarkable NECK:  No jugular venous distention, waveform within normal limits, carotid upstroke brisk and symmetric, no bruits, no thyromegaly LYMPHATICS:  No cervical, inguinal adenopathy LUNGS:  Clear to auscultation bilaterally BACK:  No CVA tenderness CHEST:  Unremarkable HEART:  PMI not displaced or sustained,S1 and S2 within normal limits, no S3, no S4, no clicks, no rubs, very soft right upper sternal border early peaking systolic murmur, no diastolic murmurs ABD:  Flat, positive bowel sounds normal in frequency in pitch, no bruits, no  rebound, no guarding, no midline pulsatile mass, no hepatomegaly, no splenomegaly EXT:  2 plus pulses throughout, no edema, no cyanosis no clubbing SKIN:  No rashes no nodules NEURO:  Cranial nerves II through XII grossly intact, motor grossly intact throughout PSYCH:  Cognitively intact, oriented to person place and time    EKG:  EKG is not ordered today. The ekg ordered 10/18/2019 demonstrates sinus rhythm, rate 75, axis within normal limits, intervals within normal limits, possible  old inferior infarct.   Recent Labs: 07/14/2019: TSH 1.920 10/18/2019: ALT 24; BUN 15; Creatinine, Ser 0.88; Hemoglobin 15.1; Platelets 176; Potassium 3.8; Sodium 144    Lipid Panel    Component Value Date/Time   CHOL 106 10/18/2019 1509   CHOL 120 11/15/2012 0917   TRIG 266 (H) 10/18/2019 1509   TRIG 87 06/23/2013 0908   TRIG 113 11/15/2012 0917   HDL 29 (L) 10/18/2019 1509   HDL 41 06/23/2013 0908   HDL 43 11/15/2012 0917   CHOLHDL 3.7 10/18/2019 1509   LDLCALC 36 10/18/2019 1509   LDLCALC 48 06/23/2013 0908   LDLCALC 54 11/15/2012 0917      Wt Readings from Last 3 Encounters:  11/30/19 255 lb (115.7 kg)  11/10/19 252 lb (114.3 kg)  10/18/19 258 lb (117 kg)      Other studies Reviewed: Additional studies/ records that were reviewed today include: Labs, CT 2019. Review of the above records demonstrates:  Please see elsewhere in the note.     ASSESSMENT AND PLAN:  DOE: He does have some dyspnea on exertion and significant risk factors as well as coronary calcium.  I like to screen him with a stress test.  He would like to give a POET (Plain Old Exercise Treadmill) a try but will make this modified Bruce.  If you cannot do this he might need a perfusion study.  HTN: The blood pressure is controlled.  No change in therapy.  DYSLIPIDEMIA:   LDL was 54 with an HDL of 29.  No change in therapy.  LEG PAIN: I will measure ABIs.  I do see that these were normal in 2019.  I suspect this is back  pain and joint pain.  DM: A1c was 7.5.  I will defer to his primary provider.  COVID EDUCATION: He has been vaccinated.  Current medicines are reviewed at length with the patient today.  The patient does not have concerns regarding medicines.  The following changes have been made:  no change  Labs/ tests ordered today include:   Orders Placed This Encounter  Procedures  . US ARTERIAL LOWER EXTREMITY DUPLEX BILATERAL  . EXERCISE TOLERANCE TEST (ETT)     Disposition:   FU with me in one year or sooner if needed.   Signed, Minus Breeding, MD  11/30/2019 4:42 PM    [facility]

## 2019-11-30 ENCOUNTER — Encounter: Payer: Self-pay | Admitting: Cardiology

## 2019-11-30 ENCOUNTER — Ambulatory Visit: Payer: PPO | Admitting: Cardiology

## 2019-11-30 ENCOUNTER — Other Ambulatory Visit: Payer: Self-pay

## 2019-11-30 VITALS — BP 102/68 | HR 76 | Ht 66.0 in | Wt 255.0 lb

## 2019-11-30 DIAGNOSIS — I10 Essential (primary) hypertension: Secondary | ICD-10-CM

## 2019-11-30 DIAGNOSIS — Z7189 Other specified counseling: Secondary | ICD-10-CM | POA: Diagnosis not present

## 2019-11-30 DIAGNOSIS — M79606 Pain in leg, unspecified: Secondary | ICD-10-CM | POA: Diagnosis not present

## 2019-11-30 DIAGNOSIS — R0602 Shortness of breath: Secondary | ICD-10-CM | POA: Diagnosis not present

## 2019-11-30 DIAGNOSIS — E785 Hyperlipidemia, unspecified: Secondary | ICD-10-CM

## 2019-11-30 NOTE — Patient Instructions (Signed)
Medication Instructions:  The current medical regimen is effective;  continue present plan and medications.  *If you need a refill on your cardiac medications before your next appointment, please call your pharmacy*  Testing/Procedures: Your physician has requested that you have an exercise tolerance test. This test will be completed at [facility].  Please check in as the Main Entrance.  Nothing to eat/drink 4 hours before.  No caffeine 12 hours before.  Your physician has requested that you have a lower extremity arterial exercise duplex. During this test, exercise and ultrasound are used to evaluate arterial blood flow in the legs. Allow one hour for this exam. There are no restrictions or special instructions.  This test will be completed at [facility]  You will be called to be scheduled.  [phone]   Follow-Up: At [facility], you and your health needs are our priority.  As part of our continuing mission to provide you with exceptional heart care, we have created designated Provider Care Teams.  These Care Teams include your primary Cardiologist (physician) and Advanced Practice Providers (APPs -  Physician Assistants and Nurse Practitioners) who all work together to provide you with the care you need, when you need it.  We recommend signing up for the patient portal called "MyChart".  Sign up information is provided on this After Visit Summary.  MyChart is used to connect with patients for Virtual Visits (Telemedicine).  Patients are able to view lab/test results, encounter notes, upcoming appointments, etc.  Non-urgent messages can be sent to your provider as well.   To learn more about what you can do with MyChart, go to [email or web address].    Your next appointment:   12 month(s)  The format for your next appointment:   In Person  Provider:   Minus Breeding, MD   Thank you for choosing [facility]!!

## 2019-12-05 ENCOUNTER — Other Ambulatory Visit: Payer: Self-pay | Admitting: *Deleted

## 2019-12-05 DIAGNOSIS — R0789 Other chest pain: Secondary | ICD-10-CM

## 2019-12-05 MED ORDER — MELOXICAM 7.5 MG PO TABS: 7.5000 mg | ORAL_TABLET | Freq: Every day | ORAL | 1 refills | Status: DC

## 2019-12-08 ENCOUNTER — Other Ambulatory Visit: Payer: Self-pay | Admitting: *Deleted

## 2019-12-08 ENCOUNTER — Ambulatory Visit (INDEPENDENT_AMBULATORY_CARE_PROVIDER_SITE_OTHER): Payer: PPO | Admitting: Licensed Clinical Social Worker

## 2019-12-08 DIAGNOSIS — I10 Essential (primary) hypertension: Secondary | ICD-10-CM

## 2019-12-08 DIAGNOSIS — E782 Mixed hyperlipidemia: Secondary | ICD-10-CM | POA: Diagnosis not present

## 2019-12-08 DIAGNOSIS — M5136 Other intervertebral disc degeneration, lumbar region: Secondary | ICD-10-CM

## 2019-12-08 DIAGNOSIS — I714 Abdominal aortic aneurysm, without rupture, unspecified: Secondary | ICD-10-CM

## 2019-12-08 MED ORDER — RAMIPRIL 10 MG PO CAPS: 10.0000 mg | ORAL_CAPSULE | Freq: Every day | ORAL | 3 refills | Status: DC

## 2019-12-08 NOTE — Patient Instructions (Addendum)
Licensed Clinical Social Worker Visit Information  Goals we discussed today:    .  Client will talk with LCSW in next 30 days to discuss client completion of ADLs and client completion of daily activities (pt-stated)        CARE PLAN ENTRY   Current Barriers:   Pain issues in knees, legs of client with Chronic Diagnoses of AAA, HTN, HLD, DDD  Arthritis issues  Clinical Social Work Clinical Goal(s):   LCSW to talk with client in next 30 days to discuss client completion of ADLs and client completion of daily activities  Interventions:  Talked with client about social support system (has support from brother)  Talked with client about client completion of ADLs  Talked with client about CCM program support  Encouraged client to talk with RNCM about nursing support for client  Talked with client about mobility challenges and ambulation challenges for client  Talked with client about meal provision for client   (he and his brother cook meals and eat meals together)  Talked with Hollice Espy about transport needs  Talked with client about relaxaton techniques (watches TV, spends time visiting with his brother, walks short distances as able, enjoys being outdoors,)  Talked with client about pain issues of client  Talked with client about healing of spot on his lip (he said that his lip sore is starting to improve)  Talked with client about his upcoming client appointments  Talked with client about ambulation needs of client (he uses a cane in the home occasionally to help him walk)  Patient Self Care Activities:   Completes ADLs independently Takes medications as prescribed Attends scheduled medical appointments  Patient Self Care Deficits:   Pain issues  Mobility challenges  Arthritis  Initial goal documentation    Follow Up Plan: LCSW to call client in next 4 weeks to talk with client about his completion of daily ADLs and about his completion of daily  activities  Materials Provided: No  The patient verbalized understanding of instructions provided today and declined a print copy of patient instruction materials.   [name].Jerrye Seebeck MSW, LCSW Licensed Clinical Social Worker [facility] [phone]

## 2019-12-08 NOTE — Chronic Care Management (AMB) (Addendum)
Chronic Care Management    Clinical Social Work Follow Up Note  12/08/2019 Name: Lonnie Hurley MRN: [id number] DOB: 11/19/1940  Lonnie Hurley is a 79 y.o. year old male who is a primary care patient of Sharion Balloon, FNP. The CCM team was consulted for assistance with [name] .   Review of patient status, including review of consultants reports, other relevant assessments, and collaboration with appropriate care team members and the patient's provider was performed as part of comprehensive patient evaluation and provision of chronic care management services.    SDOH (Social Determinants of Health) assessments performed: No; risk for social isolation; risk for tobacco use; risk for physical inactivity    Office Visit from 04/14/2018 in [place]  PHQ-9 Total Score 4        Outpatient Encounter Medications as of 12/08/2019  Medication Sig   acetaminophen (TYLENOL) 500 MG tablet Take 500 mg by mouth every 6 (six) hours as needed.   Calcium Carb-Cholecalciferol (CALCIUM 600 + D PO) Take by mouth 2 (two) times daily.    Cholecalciferol (VITAMIN D3) 2000 UNITS TABS Take 1 tablet by mouth daily.    diclofenac Sodium (VOLTAREN) 1 % GEL Apply 2 g topically 4 (four) times daily.   ezetimibe (ZETIA) 10 MG tablet Take 1 tablet (10 mg total) by mouth daily.   gabapentin (NEURONTIN) 100 MG capsule Take 1 capsule (100 mg total) by mouth 3 (three) times daily.   Glucosamine-Chondroit-Vit C-Mn (GLUCOSAMINE 1500 COMPLEX PO) Take by mouth 2 (two) times daily.    glucose blood (ONE TOUCH ULTRA TEST) test strip Use as instructed   hydrochlorothiazide (HYDRODIURIL) 25 MG tablet Take 1 tablet (25 mg total) by mouth daily.   meloxicam (MOBIC) 7.5 MG tablet Take 1 tablet (7.5 mg total) by mouth daily.   Multiple Vitamins-Minerals (MULTIVITAMIN WITH MINERALS) tablet Take 1 tablet by mouth daily.   oxybutynin (DITROPAN-XL) 10 MG 24 hr tablet Take 10 mg by mouth daily.    polyethylene glycol powder (MIRALAX) 17 GM/SCOOP powder Take 17 g by mouth as needed for moderate constipation.   ramipril (ALTACE) 10 MG capsule Take 1 capsule (10 mg total) by mouth daily.   simvastatin (ZOCOR) 40 MG tablet Take 1 tablet (40 mg total) by mouth at bedtime.   tamsulosin (FLOMAX) 0.4 MG CAPS capsule Take 1 capsule (0.4 mg total) by mouth at bedtime.   Vitamin D, Ergocalciferol, (DRISDOL) 1.25 MG (50000 UNIT) CAPS capsule Take 1 capsule (50,000 Units total) by mouth every 7 (seven) days.   Facility-Administered Encounter Medications as of 12/08/2019  Medication   cyanocobalamin ((VITAMIN B-12)) injection 1,000 mcg     Goals       Client will talk with LCSW in next 30 days to discuss client completion of ADLs and client completion of daily activities (pt-stated)      CARE PLAN ENTRY   Current Barriers:  Pain issues in knees, legs of client with Chronic Diagnoses of AAA, HTN, HLD, DDD Arthritis issues  Clinical Social Work Clinical Goal(s):  LCSW to talk with client in next 30 days to discuss client completion of ADLs and client completion of daily activities  Interventions: Talked with client about social support system (has support from brother) Talked with client about client completion of ADLs Talked with client about CCM program support Encouraged client to talk with RNCM about nursing support for client Talked with client about mobility challenges and ambulation challenges for client Talked with client about meal provision  for client  (he and his brother cook meals and eat meals together) Talked with Hollice Espy about transport needs Talked with client about relaxaton techniques (watches TV, spends time visiting with his brother, walks short distances as able, enjoys being outdoors,) Talked with client about pain issues of client Talked with client about healing of spot on his lip (he said that his lip sore is starting to improve) Talked with client about his  upcoming client appointments Talked with client about ambulation needs of client (he uses a cane in the home occasionally to help him walk)  Patient Self Care Activities:   Completes ADLs independently Takes medications as prescribed Attends scheduled medical appointments  Patient Self Care Deficits:  Pain issues Mobility challenges Arthritis  Initial goal documentation    Follow Up Plan: LCSW to call client in next 4 weeks to talk with client about his completion of daily ADLs and about his completion of daily activities  [name].Chaka Boyson MSW, LCSW Licensed Clinical Social Worker [facility] Care Management [phone]  I have reviewed the CCM documentation and agree with the written assessment and plan of care.  [name], FNP

## 2019-12-09 ENCOUNTER — Telehealth: Payer: PPO

## 2019-12-13 ENCOUNTER — Ambulatory Visit: Payer: PPO | Admitting: *Deleted

## 2019-12-13 DIAGNOSIS — I10 Essential (primary) hypertension: Secondary | ICD-10-CM

## 2019-12-13 DIAGNOSIS — R0602 Shortness of breath: Secondary | ICD-10-CM

## 2019-12-13 NOTE — Chronic Care Management (AMB) (Addendum)
°  Chronic Care Management   Care Coordination Note  12/13/2019 Name: Lonnie Hurley MRN: [id number] DOB: February 17, 1941  Referred by: Sharion Balloon, FNP Reason for referral : Chronic Care Management (Care Coordination)   An unsuccessful telephone follow-up was attempted today. The patient was referred to the case management team for assistance with care management and care coordination. I was consulted by Theadore Nan, LCSW because the patient has been short of breath. Per EMR, patient was evaluated by cardiologist, Dr Percival Spanish, on 11/30/19 for dyspnea with exertion and is scheduled for a stress test in August.   Follow Up Plan: Telephone follow up appointment with care management team member scheduled for:01/13/20 with LCSW Treadmill scheduled for 01/16/20 RNCM will reach out again over the next 47 days  Chong Sicilian, BSN, RN-BC [place] [facility] [place] Management Direct Dial: [phone]    I have reviewed the CCM documentation and agree with the written assessment and plan of care.  [name], FNP

## 2019-12-20 ENCOUNTER — Ambulatory Visit (INDEPENDENT_AMBULATORY_CARE_PROVIDER_SITE_OTHER): Payer: PPO | Admitting: *Deleted

## 2019-12-20 ENCOUNTER — Other Ambulatory Visit: Payer: Self-pay

## 2019-12-20 DIAGNOSIS — E538 Deficiency of other specified B group vitamins: Secondary | ICD-10-CM

## 2019-12-20 NOTE — Progress Notes (Signed)
B12 injection given and tolerated well.  

## 2020-01-03 ENCOUNTER — Ambulatory Visit: Payer: PPO | Admitting: *Deleted

## 2020-01-03 DIAGNOSIS — I10 Essential (primary) hypertension: Secondary | ICD-10-CM

## 2020-01-03 DIAGNOSIS — E1169 Type 2 diabetes mellitus with other specified complication: Secondary | ICD-10-CM

## 2020-01-03 NOTE — Chronic Care Management (AMB) (Addendum)
°  Chronic Care Management   Follow-up Outreach  01/03/2020 Name: Lonnie Hurley MRN: [id number] DOB: 11/08/1940  Referred by: Sharion Balloon, FNP Reason for referral : Chronic Care Management (RN follow up)   An unsuccessful telephone follow-up was attempted today. The patient was referred to the case management team for assistance with care management and care coordination.   Follow Up Plan: The care management team will reach out to the patient again over the next 30 days.   Chong Sicilian, BSN, RN-BC Embedded Chronic Care Manager [facility] [place] [facility] Direct Dial: [phone]   I have reviewed the CCM documentation and agree with the written assessment and plan of care.  [name], FNP

## 2020-01-09 ENCOUNTER — Telehealth: Payer: Self-pay | Admitting: Cardiology

## 2020-01-09 DIAGNOSIS — R0602 Shortness of breath: Secondary | ICD-10-CM

## 2020-01-09 DIAGNOSIS — I700 Atherosclerosis of aorta: Secondary | ICD-10-CM

## 2020-01-09 NOTE — Telephone Encounter (Signed)
Will forward this information to Dr Percival Spanish for his review and any new orders.

## 2020-01-09 NOTE — Telephone Encounter (Signed)
Spoke with brother, will give patient message to call back

## 2020-01-09 NOTE — Telephone Encounter (Signed)
Please call Pt re:GXT scheduled    Please call [phone]    Thanks renee

## 2020-01-09 NOTE — Telephone Encounter (Signed)
Patient states he has severe arthritis in knees and cannot walk on a treadmill. I will message Dr.Hochrein's nurse, Cherre Huger.

## 2020-01-10 NOTE — Telephone Encounter (Signed)
DOE: He does have some dyspnea on exertion and significant risk factors as well as coronary calcium.  I like to screen him with a stress test.  He would like to give a POET (Plain Old Exercise Treadmill) a try but will make this modified Bruce.  If you cannot do this he might need a perfusion study.  The above documentation is from Dr Rosezella [place] 11/30/2019 OV note.  Pt will be ordered to have [employer] at [facility].  The GXT previously scheduled will need to be cancelled.  Pt is aware of this change and that he will be contacted to be scheduled.

## 2020-01-10 NOTE — Addendum Note (Signed)
Addended by: Shellia Cleverly on: 01/10/2020 09:15 AM   Modules accepted: Orders

## 2020-01-13 ENCOUNTER — Other Ambulatory Visit (HOSPITAL_COMMUNITY)
Admission: RE | Admit: 2020-01-13 | Discharge: 2020-01-13 | Disposition: A | Discharge status: Home / Self Care | Payer: PPO | Source: Ambulatory Visit | Attending: Family | Admitting: Family

## 2020-01-13 ENCOUNTER — Ambulatory Visit (INDEPENDENT_AMBULATORY_CARE_PROVIDER_SITE_OTHER): Payer: PPO | Admitting: Licensed Clinical Social Worker

## 2020-01-13 ENCOUNTER — Other Ambulatory Visit: Payer: Self-pay

## 2020-01-13 DIAGNOSIS — Z01812 Encounter for preprocedural laboratory examination: Secondary | ICD-10-CM | POA: Diagnosis not present

## 2020-01-13 DIAGNOSIS — I10 Essential (primary) hypertension: Secondary | ICD-10-CM

## 2020-01-13 DIAGNOSIS — I714 Abdominal aortic aneurysm, without rupture, unspecified: Secondary | ICD-10-CM

## 2020-01-13 DIAGNOSIS — E782 Mixed hyperlipidemia: Secondary | ICD-10-CM

## 2020-01-13 DIAGNOSIS — Z20822 Contact with and (suspected) exposure to covid-19: Secondary | ICD-10-CM | POA: Diagnosis not present

## 2020-01-13 DIAGNOSIS — M5136 Other intervertebral disc degeneration, lumbar region: Secondary | ICD-10-CM

## 2020-01-13 LAB — SARS CORONAVIRUS 2 (TAT 6-24 HRS): SARS Coronavirus 2: NEGATIVE

## 2020-01-13 NOTE — Patient Instructions (Addendum)
Licensed Clinical Social Worker Visit Information  Goals we discussed today:  Goals Addressed              This Visit's Progress   .  Client will talk with LCSW in next 30 days to discuss client completion of ADLs and client completion of daily activities (pt-stated)        CARE PLAN ENTRY   Current Barriers:  . Pain issues in knees, legs of client with Chronic Diagnoses of AAA, HTN, HLD, DDD . Arthritis issues  Clinical Social Work Clinical Goal(s):  Marland Kitchen LCSW to talk with client in next 30 days to discuss client completion of ADLs and client completion of daily activities  Interventions: . Talked with client about social support system (has support from brother) . Talked with client about upcoming client appointments  . Talked with client about client completion of ADLs . Talked with client about CCM program support . Encouraged client to talk with [facility] about nursing support for client . Talked with client about mobility challenges and ambulation challenges for client . Talked with client about meal provision for client (he and his brother cook meals and eat meals together)  Talked with Hollice Espy about pain issues of client   Talked with Ercole about upcoming medical test for him this coming Monday at [facility]  Talked with Elazar about transport needs  Patient Self Care Activities:   Completes ADLs independently Takes medications as prescribed Attends scheduled medical appointments  Patient Self Care Deficits:  . Pain issues . Mobility challenges . Arthritis  Initial goal documentation      Materials Provided: No  Follow Up Plan:LCSW to call client in next 4 weeks to talk with client about his completion of daily ADLs and about his completion of daily activities  The patient verbalized understanding of instructions provided today and declined a print copy of patient instruction materials.   [name].Jeryl Wilbourn MSW, LCSW Licensed Clinical Social  Worker [facility] Care Management [phone]

## 2020-01-13 NOTE — Chronic Care Management (AMB) (Addendum)
Chronic Care Management    Clinical Social Work Follow Up Note  01/13/2020 Name: Lonnie Hurley MRN: [id number] DOB: 1941-04-27  Lonnie Hurley is a 79 y.o. year old male who is a primary care patient of Sharion Balloon, FNP. The CCM team was consulted for assistance with [name] .   Review of patient status, including review of consultants reports, other relevant assessments, and collaboration with appropriate care team members and the patient's provider was performed as part of comprehensive patient evaluation and provision of chronic care management services.    SDOH (Social Determinants of Health) assessments performed: No;risk for social isolation; risk for tobacco use; risk for depression; risk for physical inactivity    Office Visit from 04/14/2018 in [place]  PHQ-9 Total Score 4        Outpatient Encounter Medications as of 01/13/2020  Medication Sig   acetaminophen (TYLENOL) 500 MG tablet Take 500 mg by mouth every 6 (six) hours as needed.   Calcium Carb-Cholecalciferol (CALCIUM 600 + D PO) Take by mouth 2 (two) times daily.    Cholecalciferol (VITAMIN D3) 2000 UNITS TABS Take 1 tablet by mouth daily.    diclofenac Sodium (VOLTAREN) 1 % GEL Apply 2 g topically 4 (four) times daily.   ezetimibe (ZETIA) 10 MG tablet Take 1 tablet (10 mg total) by mouth daily.   gabapentin (NEURONTIN) 100 MG capsule Take 1 capsule (100 mg total) by mouth 3 (three) times daily.   Glucosamine-Chondroit-Vit C-Mn (GLUCOSAMINE 1500 COMPLEX PO) Take by mouth 2 (two) times daily.    glucose blood (ONE TOUCH ULTRA TEST) test strip Use as instructed   hydrochlorothiazide (HYDRODIURIL) 25 MG tablet Take 1 tablet (25 mg total) by mouth daily.   meloxicam (MOBIC) 7.5 MG tablet Take 1 tablet (7.5 mg total) by mouth daily.   Multiple Vitamins-Minerals (MULTIVITAMIN WITH MINERALS) tablet Take 1 tablet by mouth daily.   oxybutynin (DITROPAN-XL) 10 MG 24 hr tablet Take 10 mg  by mouth daily.   polyethylene glycol powder (MIRALAX) 17 GM/SCOOP powder Take 17 g by mouth as needed for moderate constipation.   ramipril (ALTACE) 10 MG capsule Take 1 capsule (10 mg total) by mouth daily.   simvastatin (ZOCOR) 40 MG tablet Take 1 tablet (40 mg total) by mouth at bedtime.   tamsulosin (FLOMAX) 0.4 MG CAPS capsule Take 1 capsule (0.4 mg total) by mouth at bedtime.   Vitamin D, Ergocalciferol, (DRISDOL) 1.25 MG (50000 UNIT) CAPS capsule Take 1 capsule (50,000 Units total) by mouth every 7 (seven) days.   Facility-Administered Encounter Medications as of 01/13/2020  Medication   cyanocobalamin ((VITAMIN B-12)) injection 1,000 mcg     Goals Addressed               This Visit's Progress     Client will talk with LCSW in next 30 days to discuss client completion of ADLs and client completion of daily activities (pt-stated)        CARE PLAN ENTRY   Current Barriers:  Pain issues in knees, legs of client with Chronic Diagnoses of AAA, HTN, HLD, DDD Arthritis issues  Clinical Social Work Clinical Goal(s):  LCSW to talk with client in next 30 days to discuss client completion of ADLs and client completion of daily activities  Interventions: Talked with client about social support system (has support from brother) Talked with client about upcoming client appointments  Talked with client about client completion of ADLs Talked with client about CCM program support  Encouraged client to talk with RNCM about nursing support for client Talked with client about mobility challenges and ambulation challenges for client Talked with client about meal provision for client (he and his brother cook meals and eat meals        together) Talked with Hollice Espy about pain issues of client  Talked with Delando about upcoming medical test for him this coming Monday at [facility] Talked with Hollice Espy about transport needs  Patient Self Care Activities:   Completes ADLs  independently Takes medications as prescribed Attends scheduled medical appointments  Patient Self Care Deficits:  Pain issues Mobility challenges Arthritis  Initial goal documentation    Follow Up Plan: LCSW to call client in next 4 weeks to talk with client about his completion of daily ADLs and about his completion of daily activities  [name].Dorie Ohms MSW, LCSW Licensed Clinical Social Worker [facility] Care Management [phone]  I have reviewed and agree with the above  documentation.   Lonnie Dun, FNP

## 2020-01-16 ENCOUNTER — Other Ambulatory Visit: Payer: Self-pay

## 2020-01-16 ENCOUNTER — Encounter (HOSPITAL_COMMUNITY): Payer: PPO

## 2020-01-16 ENCOUNTER — Other Ambulatory Visit: Payer: Self-pay | Admitting: *Deleted

## 2020-01-16 ENCOUNTER — Ambulatory Visit (HOSPITAL_COMMUNITY)
Admission: RE | Admit: 2020-01-16 | Discharge: 2020-01-16 | Disposition: A | Discharge status: Home / Self Care | Payer: PPO | Source: Ambulatory Visit | Attending: Cardiology | Admitting: Cardiology

## 2020-01-16 DIAGNOSIS — M79605 Pain in left leg: Secondary | ICD-10-CM | POA: Diagnosis not present

## 2020-01-16 DIAGNOSIS — M79606 Pain in leg, unspecified: Secondary | ICD-10-CM | POA: Diagnosis not present

## 2020-01-16 MED ORDER — HYDROCHLOROTHIAZIDE 25 MG PO TABS: 25.0000 mg | ORAL_TABLET | Freq: Every day | ORAL | 0 refills | Status: DC

## 2020-01-20 ENCOUNTER — Ambulatory Visit (HOSPITAL_COMMUNITY)
Admission: RE | Admit: 2020-01-20 | Discharge: 2020-01-20 | Disposition: A | Discharge status: Home / Self Care | Payer: PPO | Source: Ambulatory Visit | Attending: Cardiology | Admitting: Cardiology

## 2020-01-20 ENCOUNTER — Encounter (HOSPITAL_BASED_OUTPATIENT_CLINIC_OR_DEPARTMENT_OTHER)
Admission: RE | Admit: 2020-01-20 | Discharge: 2020-01-20 | Disposition: A | Discharge status: Home / Self Care | Payer: PPO | Source: Ambulatory Visit | Attending: Cardiology | Admitting: Cardiology

## 2020-01-20 ENCOUNTER — Encounter (HOSPITAL_COMMUNITY): Payer: Self-pay

## 2020-01-20 ENCOUNTER — Other Ambulatory Visit: Payer: Self-pay

## 2020-01-20 DIAGNOSIS — R0602 Shortness of breath: Secondary | ICD-10-CM | POA: Diagnosis not present

## 2020-01-20 DIAGNOSIS — I10 Essential (primary) hypertension: Secondary | ICD-10-CM | POA: Insufficient documentation

## 2020-01-20 DIAGNOSIS — I700 Atherosclerosis of aorta: Secondary | ICD-10-CM

## 2020-01-20 LAB — NM MYOCAR MULTI W/SPECT W/WALL MOTION / EF
LV dias vol: 103 mL (ref 62–150)
LV sys vol: 37 mL
Peak HR: 83 {beats}/min
RATE: 0.49
Rest HR: 61 {beats}/min
SDS: 4
SRS: 1
SSS: 5
TID: 1.28

## 2020-01-20 MED ORDER — TECHNETIUM TC 99M TETROFOSMIN IV KIT
30.0000 | PACK | Freq: Once | INTRAVENOUS | Status: AC | PRN
  Administered 2020-01-20: 31 via INTRAVENOUS

## 2020-01-20 MED ORDER — REGADENOSON 0.4 MG/5ML IV SOLN
INTRAVENOUS | Status: AC
  Administered 2020-01-20: 0.4 mg via INTRAVENOUS
  Filled 2020-01-20: qty 5

## 2020-01-20 MED ORDER — TECHNETIUM TC 99M TETROFOSMIN IV KIT
10.0000 | PACK | Freq: Once | INTRAVENOUS | Status: AC | PRN
  Administered 2020-01-20: 11 via INTRAVENOUS

## 2020-01-20 MED ORDER — SODIUM CHLORIDE FLUSH 0.9 % IV SOLN
INTRAVENOUS | Status: AC
  Administered 2020-01-20: 10 mL via INTRAVENOUS
  Filled 2020-01-20: qty 10

## 2020-01-23 ENCOUNTER — Ambulatory Visit (INDEPENDENT_AMBULATORY_CARE_PROVIDER_SITE_OTHER): Payer: PPO | Admitting: Family Medicine

## 2020-01-23 ENCOUNTER — Other Ambulatory Visit: Payer: Self-pay

## 2020-01-23 ENCOUNTER — Telehealth: Payer: Self-pay | Admitting: *Deleted

## 2020-01-23 DIAGNOSIS — E538 Deficiency of other specified B group vitamins: Secondary | ICD-10-CM | POA: Diagnosis not present

## 2020-01-23 DIAGNOSIS — R0602 Shortness of breath: Secondary | ICD-10-CM

## 2020-01-23 NOTE — Telephone Encounter (Signed)
Advised patient, verbalized understanding Patient would like at [facility]

## 2020-01-23 NOTE — Telephone Encounter (Signed)
-----   Message from Minus Breeding, MD sent at 01/22/2020 12:03 PM EDT ----- Probable artifact.  However, given the SOB and the question of inferior infarct (likely artifact) I would like to exclude a wall motion abnormality on echo.  Please order an echocardiogram.  Call Mr. Drozdowski with the results and send results to Sharion Balloon, FNP

## 2020-01-30 DIAGNOSIS — M1711 Unilateral primary osteoarthritis, right knee: Secondary | ICD-10-CM | POA: Diagnosis not present

## 2020-01-30 DIAGNOSIS — M25551 Pain in right hip: Secondary | ICD-10-CM | POA: Diagnosis not present

## 2020-01-30 DIAGNOSIS — M25562 Pain in left knee: Secondary | ICD-10-CM | POA: Diagnosis not present

## 2020-01-31 ENCOUNTER — Ambulatory Visit (HOSPITAL_COMMUNITY)
Admission: RE | Admit: 2020-01-31 | Discharge: 2020-01-31 | Disposition: A | Discharge status: Home / Self Care | Payer: PPO | Source: Ambulatory Visit | Attending: Cardiology | Admitting: Cardiology

## 2020-01-31 ENCOUNTER — Other Ambulatory Visit: Payer: Self-pay

## 2020-01-31 DIAGNOSIS — R0602 Shortness of breath: Secondary | ICD-10-CM | POA: Insufficient documentation

## 2020-01-31 LAB — ECHOCARDIOGRAM COMPLETE
AR max vel: 1.58 cm2
AV Area VTI: 1.59 cm2
AV Area mean vel: 1.6 cm2
AV Mean grad: 10 mmHg
AV Peak grad: 17.7 mmHg
Ao pk vel: 2.11 m/s
Area-P 1/2: 1.81 cm2
S' Lateral: 3.19 cm

## 2020-01-31 NOTE — Progress Notes (Signed)
*  PRELIMINARY RESULTS* Echocardiogram 2D Echocardiogram has been performed.  Samuel Germany 01/31/2020, 9:25 AM

## 2020-02-07 ENCOUNTER — Other Ambulatory Visit: Payer: Self-pay

## 2020-02-07 ENCOUNTER — Ambulatory Visit: Payer: PPO | Admitting: Pharmacist

## 2020-02-08 DIAGNOSIS — H903 Sensorineural hearing loss, bilateral: Secondary | ICD-10-CM | POA: Diagnosis not present

## 2020-02-15 ENCOUNTER — Telehealth: Payer: PPO

## 2020-02-24 ENCOUNTER — Other Ambulatory Visit: Payer: Self-pay

## 2020-02-24 ENCOUNTER — Ambulatory Visit (INDEPENDENT_AMBULATORY_CARE_PROVIDER_SITE_OTHER): Payer: PPO | Admitting: *Deleted

## 2020-02-24 DIAGNOSIS — E538 Deficiency of other specified B group vitamins: Secondary | ICD-10-CM

## 2020-02-24 NOTE — Progress Notes (Signed)
Vitamin b12 injection given and tolerated well.  

## 2020-03-05 ENCOUNTER — Telehealth: Payer: Self-pay | Admitting: Family

## 2020-03-05 DIAGNOSIS — E785 Hyperlipidemia, unspecified: Secondary | ICD-10-CM

## 2020-03-05 MED ORDER — SIMVASTATIN 40 MG PO TABS: 40.0000 mg | ORAL_TABLET | Freq: Every day | ORAL | 0 refills | Status: DC

## 2020-03-05 NOTE — Telephone Encounter (Signed)
  Prescription Request  03/05/2020  What is the name of the medication or equipment? simvastatin (ZOCOR) 40 MG tablet hydrochlorothiazide (HYDRODIURIL) 25 MG tablet    Have you contacted your pharmacy to request a refill? (if applicable) Yes  Which pharmacy would you like this sent to?  [facility] mail order pharmacy   Patient notified that their request is being sent to the clinical staff for review and that they should receive a response within 2 business days.

## 2020-03-05 NOTE — Telephone Encounter (Signed)
Pt rc for nurse 

## 2020-03-05 NOTE — Telephone Encounter (Signed)
Medication sent - left message with brother to have patient call back .

## 2020-03-05 NOTE — Telephone Encounter (Signed)
Patient aware.

## 2020-03-19 ENCOUNTER — Ambulatory Visit (INDEPENDENT_AMBULATORY_CARE_PROVIDER_SITE_OTHER): Payer: PPO

## 2020-03-19 ENCOUNTER — Other Ambulatory Visit: Payer: Self-pay

## 2020-03-19 DIAGNOSIS — Z23 Encounter for immunization: Secondary | ICD-10-CM | POA: Diagnosis not present

## 2020-03-21 DIAGNOSIS — C44229 Squamous cell carcinoma of skin of left ear and external auricular canal: Secondary | ICD-10-CM | POA: Diagnosis not present

## 2020-03-22 ENCOUNTER — Telehealth: Payer: PPO

## 2020-03-24 DIAGNOSIS — L820 Inflamed seborrheic keratosis: Secondary | ICD-10-CM | POA: Diagnosis not present

## 2020-03-24 DIAGNOSIS — C44229 Squamous cell carcinoma of skin of left ear and external auricular canal: Secondary | ICD-10-CM | POA: Diagnosis not present

## 2020-03-24 DIAGNOSIS — D485 Neoplasm of uncertain behavior of skin: Secondary | ICD-10-CM | POA: Diagnosis not present

## 2020-03-24 DIAGNOSIS — L570 Actinic keratosis: Secondary | ICD-10-CM | POA: Diagnosis not present

## 2020-03-24 DIAGNOSIS — Z85828 Personal history of other malignant neoplasm of skin: Secondary | ICD-10-CM | POA: Diagnosis not present

## 2020-03-24 DIAGNOSIS — L821 Other seborrheic keratosis: Secondary | ICD-10-CM | POA: Diagnosis not present

## 2020-03-27 ENCOUNTER — Ambulatory Visit (INDEPENDENT_AMBULATORY_CARE_PROVIDER_SITE_OTHER): Payer: PPO

## 2020-03-27 ENCOUNTER — Other Ambulatory Visit: Payer: Self-pay

## 2020-03-27 DIAGNOSIS — E538 Deficiency of other specified B group vitamins: Secondary | ICD-10-CM

## 2020-03-27 NOTE — Progress Notes (Signed)
B12 injection given and tolerated well.  

## 2020-04-02 ENCOUNTER — Other Ambulatory Visit: Payer: Self-pay | Admitting: Family

## 2020-04-02 DIAGNOSIS — M79604 Pain in right leg: Secondary | ICD-10-CM

## 2020-04-02 DIAGNOSIS — M79605 Pain in left leg: Secondary | ICD-10-CM

## 2020-04-16 ENCOUNTER — Telehealth: Payer: PPO

## 2020-04-19 ENCOUNTER — Ambulatory Visit (INDEPENDENT_AMBULATORY_CARE_PROVIDER_SITE_OTHER): Payer: PPO | Admitting: Family

## 2020-04-19 ENCOUNTER — Encounter: Payer: Self-pay | Admitting: Family

## 2020-04-19 ENCOUNTER — Other Ambulatory Visit: Payer: Self-pay

## 2020-04-19 VITALS — BP 123/65 | HR 70 | Temp 98.1°F | Ht 66.0 in | Wt 244.4 lb

## 2020-04-19 DIAGNOSIS — N401 Enlarged prostate with lower urinary tract symptoms: Secondary | ICD-10-CM

## 2020-04-19 DIAGNOSIS — D696 Thrombocytopenia, unspecified: Secondary | ICD-10-CM | POA: Diagnosis not present

## 2020-04-19 DIAGNOSIS — E1169 Type 2 diabetes mellitus with other specified complication: Secondary | ICD-10-CM

## 2020-04-19 DIAGNOSIS — M79604 Pain in right leg: Secondary | ICD-10-CM

## 2020-04-19 DIAGNOSIS — R5383 Other fatigue: Secondary | ICD-10-CM | POA: Diagnosis not present

## 2020-04-19 DIAGNOSIS — I10 Essential (primary) hypertension: Secondary | ICD-10-CM

## 2020-04-19 DIAGNOSIS — M479 Spondylosis, unspecified: Secondary | ICD-10-CM

## 2020-04-19 DIAGNOSIS — E785 Hyperlipidemia, unspecified: Secondary | ICD-10-CM | POA: Diagnosis not present

## 2020-04-19 DIAGNOSIS — M79605 Pain in left leg: Secondary | ICD-10-CM

## 2020-04-19 LAB — BAYER DCA HB A1C WAIVED: HB A1C (BAYER DCA - WAIVED): 6.5 % (ref <7.0)

## 2020-04-19 MED ORDER — EZETIMIBE 10 MG PO TABS: 10.0000 mg | ORAL_TABLET | Freq: Every day | ORAL | 3 refills | Status: DC

## 2020-04-19 MED ORDER — RAMIPRIL 10 MG PO CAPS: 10.0000 mg | ORAL_CAPSULE | Freq: Every day | ORAL | 3 refills | Status: DC

## 2020-04-19 MED ORDER — SIMVASTATIN 40 MG PO TABS: 40.0000 mg | ORAL_TABLET | Freq: Every day | ORAL | 0 refills | Status: DC

## 2020-04-19 MED ORDER — GABAPENTIN 100 MG PO CAPS: ORAL_CAPSULE | ORAL | 0 refills | Status: DC

## 2020-04-19 MED ORDER — HYDROCHLOROTHIAZIDE 12.5 MG PO TABS
12.5000 mg | ORAL_TABLET | Freq: Every day | ORAL | 3 refills | Status: DC
Start: 2020-04-19 — End: 2021-03-06

## 2020-04-19 NOTE — Progress Notes (Signed)
Subjective:    Patient ID: Lonnie Hurley, male    DOB: 1941-02-26, 79 y.o.   MRN: [id number]  Chief Complaint  Patient presents with  . Hypertension  . Diabetes   Pt presents to the office today to chronic follow up. He is followed by Urologists every 6 months for BPH. Followed by Cardiologists annually.  Hypertension This is a chronic problem. The current episode started more than 1 year ago. The problem has been resolved since onset. The problem is controlled. Pertinent negatives include no blurred vision, malaise/fatigue, peripheral edema or shortness of breath. Risk factors for coronary artery disease include dyslipidemia, diabetes mellitus, male gender and obesity. The current treatment provides moderate improvement.  Diabetes He presents for his follow-up diabetic visit. He has type 2 diabetes mellitus. His disease course has been stable. Associated symptoms include foot paresthesias. Pertinent negatives for diabetes include no blurred vision. Symptoms are stable. Diabetic complications include heart disease, nephropathy and peripheral neuropathy. Risk factors for coronary artery disease include dyslipidemia, diabetes mellitus, male sex, hypertension and sedentary lifestyle. He is following a generally unhealthy diet. His overall blood glucose range is 130-140 mg/dl. An ACE inhibitor/angiotensin II receptor blocker is being taken. Eye exam is not current.  Arthritis Presents for follow-up visit. He complains of pain and stiffness. The symptoms have been stable. Affected locations include the left knee and right knee (back).  Benign Prostatic Hypertrophy This is a chronic problem. The current episode started more than 1 year ago. The problem has been waxing and waning since onset. Irritative symptoms include nocturia and urgency. Obstructive symptoms include straining. Associated symptoms include hesitancy. Past treatments include tamsulosin. The treatment provided mild relief.       Review of Systems  Constitutional: Negative for malaise/fatigue.  Eyes: Negative for blurred vision.  Respiratory: Negative for shortness of breath.   Genitourinary: Positive for hesitancy, nocturia and urgency.  Musculoskeletal: Positive for arthritis and stiffness.  All other systems reviewed and are negative.      Objective:   Physical Exam Vitals reviewed.  Constitutional:      General: He is not in acute distress.    Appearance: He is well-developed. He is obese.  HENT:     Head: Normocephalic.     Right Ear: Tympanic membrane normal.     Left Ear: Tympanic membrane normal.  Eyes:     General:        Right eye: No discharge.        Left eye: No discharge.     Pupils: Pupils are equal, round, and reactive to light.  Neck:     Thyroid: No thyromegaly.  Cardiovascular:     Rate and Rhythm: Normal rate and regular rhythm.     Heart sounds: Normal heart sounds. No murmur heard.   Pulmonary:     Effort: Pulmonary effort is normal. No respiratory distress.     Breath sounds: Normal breath sounds. No wheezing.  Abdominal:     General: Bowel sounds are normal. There is no distension.     Palpations: Abdomen is soft.     Tenderness: There is no abdominal tenderness.  Musculoskeletal:        General: No tenderness. Normal range of motion.     Cervical back: Normal range of motion and neck supple.  Skin:    General: Skin is warm and dry.     Findings: No erythema or rash.  Neurological:     Mental Status: He is alert and oriented to  person, place, and time.     Cranial Nerves: No cranial nerve deficit.     Motor: Weakness present.     Deep Tendon Reflexes: Reflexes are normal and symmetric.  Psychiatric:        Behavior: Behavior normal.        Thought Content: Thought content normal.        Judgment: Judgment normal.     Diabetic Foot Exam - Simple   Simple Foot Form Diabetic Foot exam was performed with the following findings: Yes 04/19/2020  2:39 PM   Visual Inspection No deformities, no ulcerations, no other skin breakdown bilaterally: Yes Sensation Testing Intact to touch and monofilament testing bilaterally: Yes Pulse Check Posterior Tibialis and Dorsalis pulse intact bilaterally: Yes Comments      BP 123/65   Pulse 70   Temp 98.1 F (36.7 C) (Temporal)   Ht 5' 6"  (1.676 m)   Wt 244 lb 6.4 oz (110.9 kg)   SpO2 94%   BMI 39.45 kg/m      Assessment & Plan:  Lonnie Hurley comes in today with chief complaint of Hypertension and Diabetes   Diagnosis and orders addressed:  1. Type 2 diabetes mellitus with other specified complication, without long-term current use of insulin (HCC) - Bayer DCA Hb A1c Waived - CMP14+EGFR - CBC with Differential/Platelet  2. Essential hypertension - ramipril (ALTACE) 10 MG capsule; Take 1 capsule (10 mg total) by mouth daily.  Dispense: 90 capsule; Refill: 3 - CMP14+EGFR - CBC with Differential/Platelet  3. Thrombocytopenia ([place]) - CMP14+EGFR - CBC with Differential/Platelet  4. Fatigue - CMP14+EGFR - CBC with Differential/Platelet  5. Hyperlipidemia, unspecified hyperlipidemia type - simvastatin (ZOCOR) 40 MG tablet; Take 1 tablet (40 mg total) by mouth at bedtime.  Dispense: 90 tablet; Refill: 0 - ezetimibe (ZETIA) 10 MG tablet; Take 1 tablet (10 mg total) by mouth daily.  Dispense: 90 tablet; Refill: 3 - CMP14+EGFR - CBC with Differential/Platelet  6. Pain in both lower extremities - gabapentin (NEURONTIN) 100 MG capsule; TAKE 1 CAPSULE BY MOUTH THREE TIMES A DAY  Dispense: 90 capsule; Refill: 0 - CMP14+EGFR - CBC with Differential/Platelet  7. Primary hypertension - CMP14+EGFR - CBC with Differential/Platelet  8. Arthritis of back - CMP14+EGFR - CBC with Differential/Platelet  9. Benign prostatic hyperplasia with lower urinary tract symptoms, symptom details unspecified - CMP14+EGFR - CBC with Differential/Platelet   Labs pending Health Maintenance  reviewed Diet and exercise encouraged  Follow up plan: 6 months    [name], FNP

## 2020-04-19 NOTE — Patient Instructions (Signed)

## 2020-04-20 LAB — CMP14+EGFR
ALT: 33 IU/L (ref 0–44)
AST: 30 IU/L (ref 0–40)
Albumin/Globulin Ratio: 1.5 (ref 1.2–2.2)
Albumin: 3.8 g/dL (ref 3.7–4.7)
Alkaline Phosphatase: 55 IU/L (ref 44–121)
BUN/Creatinine Ratio: 16 (ref 10–24)
BUN: 15 mg/dL (ref 8–27)
Bilirubin Total: 0.4 mg/dL (ref 0.0–1.2)
CO2: 28 mmol/L (ref 20–29)
Calcium: 9.4 mg/dL (ref 8.6–10.2)
Chloride: 101 mmol/L (ref 96–106)
Creatinine, Ser: 0.96 mg/dL (ref 0.76–1.27)
GFR calc Af Amer: 88 mL/min/{1.73_m2} (ref >59)
GFR calc non Af Amer: 76 mL/min/{1.73_m2} (ref >59)
Globulin, Total: 2.5 g/dL (ref 1.5–4.5)
Glucose: 117 mg/dL — ABNORMAL HIGH (ref 65–99)
Potassium: 4 mmol/L (ref 3.5–5.2)
Sodium: 142 mmol/L (ref 134–144)
Total Protein: 6.3 g/dL (ref 6.0–8.5)

## 2020-04-20 LAB — CBC WITH DIFFERENTIAL/PLATELET
Basophils Absolute: 0.1 10*3/uL (ref 0.0–0.2)
Basos: 1 %
EOS (ABSOLUTE): 0.3 10*3/uL (ref 0.0–0.4)
Eos: 3 %
Hematocrit: 41.9 % (ref 37.5–51.0)
Hemoglobin: 14.2 g/dL (ref 13.0–17.7)
Immature Grans (Abs): 0 10*3/uL (ref 0.0–0.1)
Immature Granulocytes: 0 %
Lymphocytes Absolute: 3.2 10*3/uL — ABNORMAL HIGH (ref 0.7–3.1)
Lymphs: 32 %
MCH: 29.8 pg (ref 26.6–33.0)
MCHC: 33.9 g/dL (ref 31.5–35.7)
MCV: 88 fL (ref 79–97)
Monocytes Absolute: 1 10*3/uL — ABNORMAL HIGH (ref 0.1–0.9)
Monocytes: 10 %
Neutrophils Absolute: 5.3 10*3/uL (ref 1.4–7.0)
Neutrophils: 54 %
Platelets: 163 10*3/uL (ref 150–450)
RBC: 4.76 x10E6/uL (ref 4.14–5.80)
RDW: 12.9 % (ref 11.6–15.4)
WBC: 10 10*3/uL (ref 3.4–10.8)

## 2020-04-21 ENCOUNTER — Other Ambulatory Visit: Payer: Self-pay | Admitting: Family

## 2020-04-23 DIAGNOSIS — Z85828 Personal history of other malignant neoplasm of skin: Secondary | ICD-10-CM | POA: Diagnosis not present

## 2020-04-23 DIAGNOSIS — C44229 Squamous cell carcinoma of skin of left ear and external auricular canal: Secondary | ICD-10-CM | POA: Diagnosis not present

## 2020-04-25 ENCOUNTER — Ambulatory Visit: Payer: PPO

## 2020-04-30 ENCOUNTER — Ambulatory Visit (INDEPENDENT_AMBULATORY_CARE_PROVIDER_SITE_OTHER): Payer: PPO | Admitting: *Deleted

## 2020-04-30 ENCOUNTER — Other Ambulatory Visit: Payer: Self-pay

## 2020-04-30 DIAGNOSIS — E538 Deficiency of other specified B group vitamins: Secondary | ICD-10-CM | POA: Diagnosis not present

## 2020-04-30 NOTE — Progress Notes (Signed)
Patient in today for monthly B 12 injection. 1000 mcg given IM in right deltoid. Patient tolerated well.  

## 2020-05-01 ENCOUNTER — Ambulatory Visit (INDEPENDENT_AMBULATORY_CARE_PROVIDER_SITE_OTHER): Payer: PPO | Admitting: Pharmacist

## 2020-05-01 ENCOUNTER — Other Ambulatory Visit: Payer: Self-pay

## 2020-05-01 VITALS — BP 125/75

## 2020-05-01 DIAGNOSIS — I10 Essential (primary) hypertension: Secondary | ICD-10-CM | POA: Diagnosis not present

## 2020-05-01 DIAGNOSIS — E119 Type 2 diabetes mellitus without complications: Secondary | ICD-10-CM

## 2020-05-01 NOTE — Progress Notes (Signed)
    05/15/2020 Name: Lonnie Hurley MRN: [id number] DOB: June 29, 1940   S:  34 yoM presents for medication review and education Presents for diabetes evaluation, education, and management Patient was referred and last seen by Primary Care Provider on 04/19/20. Insurance coverage/medication affordability: [employer] medicare  Patient reports adherence with medications. . Current diabetes medications include: n/a . Current hypertension medications include: ramipril, hctz Goal 130/80 . Current hyperlipidemia medications include: simvastatin, zetia LDL 41 on 10/18/19  O:  Lab Results  Component Value Date   HGBA1C 6.5 04/19/2020    Vitals:   05/15/20 0812  BP: 125/75     Lipid Panel     Component Value Date/Time   CHOL 106 10/18/2019 1509   CHOL 120 11/15/2012 0917   TRIG 266 (H) 10/18/2019 1509   TRIG 87 06/23/2013 0908   TRIG 113 11/15/2012 0917   HDL 29 (L) 10/18/2019 1509   HDL 41 06/23/2013 0908   HDL 43 11/15/2012 0917   CHOLHDL 3.7 10/18/2019 1509   LDLCALC 36 10/18/2019 1509   LDLCALC 48 06/23/2013 0908   LDLCALC 54 11/15/2012 0917   A/P:  Hypertension -Recently reduced HCTZ 12.5mg  due to frequent urination -Patient's blood pressure remains controlled on reduced dose, continue current regimen  -Discussed all medications, purpose and side effects  Written patient instructions provided.  Total time in face to face counseling 30 minutes.   Regina Eck, PharmD, BCPS Clinical Pharmacist, [place]  [facility] Phone [phone]

## 2020-05-15 ENCOUNTER — Encounter: Payer: Self-pay | Admitting: Pharmacist

## 2020-05-17 ENCOUNTER — Telehealth: Payer: PPO

## 2020-05-21 DIAGNOSIS — Z4801 Encounter for change or removal of surgical wound dressing: Secondary | ICD-10-CM | POA: Diagnosis not present

## 2020-05-22 DIAGNOSIS — H02132 Senile ectropion of right lower eyelid: Secondary | ICD-10-CM | POA: Diagnosis not present

## 2020-05-22 DIAGNOSIS — H43812 Vitreous degeneration, left eye: Secondary | ICD-10-CM | POA: Diagnosis not present

## 2020-05-22 DIAGNOSIS — E119 Type 2 diabetes mellitus without complications: Secondary | ICD-10-CM | POA: Diagnosis not present

## 2020-05-22 DIAGNOSIS — Z961 Presence of intraocular lens: Secondary | ICD-10-CM | POA: Diagnosis not present

## 2020-05-22 DIAGNOSIS — H16223 Keratoconjunctivitis sicca, not specified as Sjogren's, bilateral: Secondary | ICD-10-CM | POA: Diagnosis not present

## 2020-05-22 LAB — HM DIABETES EYE EXAM

## 2020-05-30 DIAGNOSIS — N200 Calculus of kidney: Secondary | ICD-10-CM | POA: Diagnosis not present

## 2020-05-30 DIAGNOSIS — R3982 Chronic bladder pain: Secondary | ICD-10-CM | POA: Diagnosis not present

## 2020-05-30 DIAGNOSIS — R3121 Asymptomatic microscopic hematuria: Secondary | ICD-10-CM | POA: Diagnosis not present

## 2020-05-30 DIAGNOSIS — N3941 Urge incontinence: Secondary | ICD-10-CM | POA: Diagnosis not present

## 2020-05-30 DIAGNOSIS — N401 Enlarged prostate with lower urinary tract symptoms: Secondary | ICD-10-CM | POA: Diagnosis not present

## 2020-05-30 DIAGNOSIS — N3281 Overactive bladder: Secondary | ICD-10-CM | POA: Diagnosis not present

## 2020-05-31 ENCOUNTER — Ambulatory Visit (INDEPENDENT_AMBULATORY_CARE_PROVIDER_SITE_OTHER): Payer: PPO | Admitting: *Deleted

## 2020-05-31 ENCOUNTER — Other Ambulatory Visit: Payer: Self-pay

## 2020-05-31 DIAGNOSIS — E538 Deficiency of other specified B group vitamins: Secondary | ICD-10-CM | POA: Diagnosis not present

## 2020-06-04 ENCOUNTER — Other Ambulatory Visit: Payer: Self-pay

## 2020-06-04 ENCOUNTER — Ambulatory Visit
Admission: EM | Admit: 2020-06-04 | Discharge: 2020-06-04 | Disposition: A | Discharge status: Home / Self Care | Payer: PPO | Attending: Urgent Care | Admitting: Urgent Care

## 2020-06-04 DIAGNOSIS — M19042 Primary osteoarthritis, left hand: Secondary | ICD-10-CM | POA: Diagnosis not present

## 2020-06-04 DIAGNOSIS — R2232 Localized swelling, mass and lump, left upper limb: Secondary | ICD-10-CM

## 2020-06-04 DIAGNOSIS — M79642 Pain in left hand: Secondary | ICD-10-CM

## 2020-06-04 MED ORDER — PREDNISONE 20 MG PO TABS
ORAL_TABLET | ORAL | 0 refills | Status: DC
Start: 2020-06-04 — End: 2020-10-17

## 2020-06-04 NOTE — ED Triage Notes (Signed)
Pt presents with left hand pain and swelling that developed on saturday morning, denies injury, area is red and hot to touch, swelling noted to left thumb

## 2020-06-04 NOTE — Discharge Instructions (Signed)
We are using a short steroid prednisone course. It'll be for 5 days. Please just use Tylenol at a dose of 500mg -650mg  once every 6 hours as needed for your aches, pains, fevers. Do not use any nonsteroidal anti-inflammatories (NSAIDs) like ibuprofen, Motrin, naproxen, Aleve, etc. which are all available over-the-counter.

## 2020-06-04 NOTE — ED Provider Notes (Signed)
[place]   MRN: [id number] DOB: March 27, 1941  Subjective:   Lonnie Hurley is a 79 y.o. male presenting for 2-day history of acute onset left handed swelling, pain worse over the base of the thumb, radial aspect of his left hand.  Denies falls, trauma, history of gout.  Patient has established diagnosis of severe arthritis of his left hand over area that is hurting.  He did use some diclofenac gel with some relief.  Patient has well-controlled diabetes.  Denies history of heart disease.   Current Facility-Administered Medications:  .  cyanocobalamin ((VITAMIN B-12)) injection 1,000 mcg, 1,000 mcg, Intramuscular, Q30 days, [name] A, FNP, 1,000 mcg at 05/31/20 6433  Current Outpatient Medications:  .  acetaminophen (TYLENOL) 500 MG tablet, Take 500 mg by mouth every 6 (six) hours as needed., Disp: , Rfl:  .  Calcium Carb-Cholecalciferol (CALCIUM 600 + D PO), Take by mouth 2 (two) times daily. , Disp: , Rfl:  .  Cholecalciferol (VITAMIN D3) 2000 UNITS TABS, Take 1 tablet by mouth daily. , Disp: , Rfl:  .  diclofenac Sodium (VOLTAREN) 1 % GEL, Apply 2 g topically 4 (four) times daily., Disp: 350 g, Rfl: 1 .  ezetimibe (ZETIA) 10 MG tablet, Take 1 tablet (10 mg total) by mouth daily., Disp: 90 tablet, Rfl: 3 .  gabapentin (NEURONTIN) 100 MG capsule, TAKE 1 CAPSULE BY MOUTH THREE TIMES A DAY, Disp: 90 capsule, Rfl: 0 .  Glucosamine-Chondroit-Vit C-Mn (GLUCOSAMINE 1500 COMPLEX PO), Take by mouth 2 (two) times daily. , Disp: , Rfl:  .  glucose blood (ONE TOUCH ULTRA TEST) test strip, Use as instructed, Disp: 50 each, Rfl: 11 .  hydrochlorothiazide (HYDRODIURIL) 12.5 MG tablet, Take 1 tablet (12.5 mg total) by mouth daily., Disp: 90 tablet, Rfl: 3 .  meloxicam (MOBIC) 7.5 MG tablet, Take 1 tablet (7.5 mg total) by mouth daily., Disp: 20 tablet, Rfl: 1 .  Multiple Vitamins-Minerals (MULTIVITAMIN WITH MINERALS) tablet, Take 1 tablet by mouth daily., Disp: , Rfl:  .   oxybutynin (DITROPAN-XL) 10 MG 24 hr tablet, Take 10 mg by mouth daily., Disp: , Rfl:  .  polyethylene glycol powder (MIRALAX) 17 GM/SCOOP powder, Take 17 g by mouth as needed for moderate constipation., Disp: 1700 g, Rfl: 2 .  predniSONE (DELTASONE) 20 MG tablet, Take 2 tablets daily with breakfast., Disp: 10 tablet, Rfl: 0 .  ramipril (ALTACE) 10 MG capsule, Take 1 capsule (10 mg total) by mouth daily., Disp: 90 capsule, Rfl: 3 .  simvastatin (ZOCOR) 40 MG tablet, Take 1 tablet (40 mg total) by mouth at bedtime., Disp: 90 tablet, Rfl: 0 .  tamsulosin (FLOMAX) 0.4 MG CAPS capsule, Take 1 capsule (0.4 mg total) by mouth at bedtime., Disp: 90 capsule, Rfl: 3 .  Vitamin D, Ergocalciferol, (DRISDOL) 1.25 MG (50000 UNIT) CAPS capsule, Take 1 capsule (50,000 Units total) by mouth every 7 (seven) days., Disp: 12 capsule, Rfl: 3   Allergies  Allergen Reactions  . Penicillins Other (See Comments)    unknown  . Aspirin Other (See Comments)    Causes Blood in urine  . Crestor [Rosuvastatin Calcium] Other (See Comments)    Myalgia, "joint problem"  . Gabapentin Other (See Comments)    Blurry vision Pt able to take in small doses   . Hydrocodone Other (See Comments)    Constipation    . Lyrica [Pregabalin]     Blurry vision     Past Medical History:  Diagnosis Date  . BPH (benign  prostatic hypertrophy)    Dr. Rosana Hoes / Dr. Joelyn Oms  - Urologist   . Cancer [facility])    lip-mole surgery  . Cataract    bilateral  . Colon polyps   . Constipation    occasional - miralax prn  . DDD (degenerative disc disease)   . Hearing loss    left ear, no hearing aids  . Hyperlipidemia   . Hypertension   . Leukoplakia   . Microscopic hematuria    negative work up with Urology in the past.   . OAB (overactive bladder)    with past percutaneous tibial nerve stimulation therapy  . Thrombocytopenia ([place])   . Tinnitus   . Tobacco abuse   . Vitamin B 12 deficiency   . Vitamin D deficiency      Past Surgical  History:  Procedure Laterality Date  . CATARACT EXTRACTION  2007   right eye  . CATARACT EXTRACTION Left 03/21/15  . COLONOSCOPY  02/28/2014  . HERNIA REPAIR  1980  . KNEE SURGERY  02-28-2008   replaced inside right knee  . Left Knee Repair  11-10-2006  . NOSE SURGERY    . POLYPECTOMY     colon polyps  . SKIN BIOPSY  10-08-2006   left ear    Family History  Problem Relation Age of Onset  . Heart failure Father   . Glaucoma Mother   . Osteoporosis Brother   . Heart disease Brother 48       stent  . Hypertension Brother   . Hyperlipidemia Brother   . Diabetes Brother   . Colon cancer Neg Hx   . Stomach cancer Neg Hx   . Rectal cancer Neg Hx     Social History   Tobacco Use  . Smoking status: Former Smoker    Packs/day: 1.00    Years: 50.00    Pack years: 50.00    Types: Cigarettes    Quit date: 08/14/2009    Years since quitting: 10.8  . Smokeless tobacco: Never Used  Vaping Use  . Vaping Use: Never used  Substance Use Topics  . Alcohol use: No  . Drug use: No    ROS   Objective:   Vitals: BP 114/68   Pulse 72   Temp 98.1 F (36.7 C)   Resp 18   SpO2 95%   Physical Exam Constitutional:      General: He is not in acute distress.    Appearance: Normal appearance. He is well-developed and normal weight. He is not ill-appearing, toxic-appearing or diaphoretic.  HENT:     Head: Normocephalic and atraumatic.     Right Ear: External ear normal.     Left Ear: External ear normal.     Nose: Nose normal.     Mouth/Throat:     Pharynx: Oropharynx is clear.  Eyes:     General: No scleral icterus.       Right eye: No discharge.        Left eye: No discharge.     Extraocular Movements: Extraocular movements intact.     Pupils: Pupils are equal, round, and reactive to light.  Cardiovascular:     Rate and Rhythm: Normal rate.  Pulmonary:     Effort: Pulmonary effort is normal.  Musculoskeletal:       Hands:     Cervical back: Normal range of motion.   Neurological:     Mental Status: He is alert and oriented to person, place, and time.  Psychiatric:        Mood and Affect: Mood normal.        Behavior: Behavior normal.        Thought Content: Thought content normal.        Judgment: Judgment normal.    Narrative & Impression  CLINICAL DATA:  A jack fell on the patient's hand 3-5 days ago with onset of pain in the left long and ring fingers. Initial encounter.  EXAM: LEFT HAND - COMPLETE 3+ VIEW  COMPARISON:  None.  FINDINGS: No acute bony or joint abnormality is identified. Scattered degenerative change appears worst at the first [facility] joint and DIP joint of the long finger. Soft tissues are unremarkable.  IMPRESSION: No acute abnormality.  Scattered osteoarthritis appearing worst at the first [facility] joint and DIP joint of the long finger.   Electronically Signed   By: Inge Rise M.D.   On: 12/25/2015 08:34     Assessment and Plan :   PDMP not reviewed this encounter.  1. Arthritis of left hand   2. Hand pain, left   3. Localized swelling on left hand     Patient has well-established diagnosis of arthritis over area depicted.  Will use oral prednisone course to help with this.  Follow-up with hand specialist if symptoms persist. Counseled patient on potential for adverse effects with medications prescribed/recommended today, ER and return-to-clinic precautions discussed, patient verbalized understanding.    Jaynee Eagles, PA-C 06/04/20 1101

## 2020-06-13 DIAGNOSIS — M79642 Pain in left hand: Secondary | ICD-10-CM | POA: Diagnosis not present

## 2020-06-13 DIAGNOSIS — M1812 Unilateral primary osteoarthritis of first carpometacarpal joint, left hand: Secondary | ICD-10-CM | POA: Diagnosis not present

## 2020-06-14 ENCOUNTER — Telehealth: Payer: PPO

## 2020-06-18 ENCOUNTER — Telehealth: Payer: Self-pay

## 2020-06-18 DIAGNOSIS — M79604 Pain in right leg: Secondary | ICD-10-CM

## 2020-06-18 NOTE — Telephone Encounter (Signed)
  Prescription Request  06/18/2020  What is the name of the medication or equipment? Gabapentin 100 mg needs another refill called into [facility]. Has appt in May with Christy and want have to go through that date  Have you contacted your pharmacy to request a refill? (if applicable) NO  Which pharmacy would you like this sent to? [facility]   Patient notified that their request is being sent to the clinical staff for review and that they should receive a response within 2 business days.

## 2020-06-27 DIAGNOSIS — M25642 Stiffness of left hand, not elsewhere classified: Secondary | ICD-10-CM | POA: Diagnosis not present

## 2020-06-27 DIAGNOSIS — G8929 Other chronic pain: Secondary | ICD-10-CM | POA: Diagnosis not present

## 2020-06-27 DIAGNOSIS — M1812 Unilateral primary osteoarthritis of first carpometacarpal joint, left hand: Secondary | ICD-10-CM | POA: Diagnosis not present

## 2020-06-27 DIAGNOSIS — M79645 Pain in left finger(s): Secondary | ICD-10-CM | POA: Diagnosis not present

## 2020-07-03 ENCOUNTER — Ambulatory Visit: Payer: PPO

## 2020-07-05 ENCOUNTER — Ambulatory Visit: Payer: PPO | Admitting: *Deleted

## 2020-07-05 ENCOUNTER — Other Ambulatory Visit: Payer: Self-pay

## 2020-07-05 ENCOUNTER — Ambulatory Visit (INDEPENDENT_AMBULATORY_CARE_PROVIDER_SITE_OTHER): Payer: PPO | Admitting: *Deleted

## 2020-07-05 DIAGNOSIS — I10 Essential (primary) hypertension: Secondary | ICD-10-CM

## 2020-07-05 DIAGNOSIS — E1169 Type 2 diabetes mellitus with other specified complication: Secondary | ICD-10-CM

## 2020-07-05 DIAGNOSIS — E538 Deficiency of other specified B group vitamins: Secondary | ICD-10-CM | POA: Diagnosis not present

## 2020-07-05 NOTE — Progress Notes (Signed)
B12 injection. Given and tolerated well.

## 2020-07-06 ENCOUNTER — Other Ambulatory Visit: Payer: Self-pay | Admitting: *Deleted

## 2020-07-06 DIAGNOSIS — E785 Hyperlipidemia, unspecified: Secondary | ICD-10-CM

## 2020-07-06 MED ORDER — SIMVASTATIN 40 MG PO TABS: 40.0000 mg | ORAL_TABLET | Freq: Every day | ORAL | 0 refills | Status: DC

## 2020-07-06 NOTE — Chronic Care Management (AMB) (Signed)
Chronic Care Management   CCM RN Visit Note  07/05/2020 Name: Matheus Sporn MRN: [id number] DOB: 01-17-1941  Subjective: Lonnie Hurley is a 80 y.o. year old male who is a primary care patient of Sharion Balloon, FNP. The care management team was consulted for assistance with disease management and care coordination needs.    Engaged with patient by telephone for follow up visit in response to provider referral for case management and/or care coordination services.   Consent to Services:  The patient was given information about Chronic Care Management services, agreed to services, and gave verbal consent prior to initiation of services.  Please see initial visit note for detailed documentation.   Patient agreed to services and verbal consent obtained.   Assessment: Review of patient past medical history, allergies, medications, health status, including review of consultants reports, laboratory and other test data, was performed as part of comprehensive evaluation and provision of chronic care management services.   SDOH (Social Determinants of Health) assessments and interventions performed:    CCM Care Plan  Allergies  Allergen Reactions  . Penicillins Other (See Comments)    unknown  . Aspirin Other (See Comments)    Causes Blood in urine  . Crestor [Rosuvastatin Calcium] Other (See Comments)    Myalgia, "joint problem"  . Gabapentin Other (See Comments)    Blurry vision Pt able to take in small doses   . Hydrocodone Other (See Comments)    Constipation    . Lyrica [Pregabalin]     Blurry vision     Outpatient Encounter Medications as of 07/05/2020  Medication Sig  . acetaminophen (TYLENOL) 500 MG tablet Take 500 mg by mouth every 6 (six) hours as needed.  . Calcium Carb-Cholecalciferol (CALCIUM 600 + D PO) Take by mouth 2 (two) times daily.   . Cholecalciferol (VITAMIN D3) 2000 UNITS TABS Take 1 tablet by mouth daily.   . diclofenac Sodium (VOLTAREN) 1 % GEL Apply 2  g topically 4 (four) times daily.  Marland Kitchen ezetimibe (ZETIA) 10 MG tablet Take 1 tablet (10 mg total) by mouth daily.  Marland Kitchen gabapentin (NEURONTIN) 100 MG capsule TAKE 1 CAPSULE BY MOUTH THREE TIMES A DAY  . Glucosamine-Chondroit-Vit C-Mn (GLUCOSAMINE 1500 COMPLEX PO) Take by mouth 2 (two) times daily.   Marland Kitchen glucose blood (ONE TOUCH ULTRA TEST) test strip Use as instructed  . hydrochlorothiazide (HYDRODIURIL) 12.5 MG tablet Take 1 tablet (12.5 mg total) by mouth daily.  . meloxicam (MOBIC) 7.5 MG tablet Take 1 tablet (7.5 mg total) by mouth daily.  . Multiple Vitamins-Minerals (MULTIVITAMIN WITH MINERALS) tablet Take 1 tablet by mouth daily.  Marland Kitchen oxybutynin (DITROPAN-XL) 10 MG 24 hr tablet Take 10 mg by mouth daily.  . polyethylene glycol powder (MIRALAX) 17 GM/SCOOP powder Take 17 g by mouth as needed for moderate constipation.  . predniSONE (DELTASONE) 20 MG tablet Take 2 tablets daily with breakfast.  . ramipril (ALTACE) 10 MG capsule Take 1 capsule (10 mg total) by mouth daily.  . simvastatin (ZOCOR) 40 MG tablet Take 1 tablet (40 mg total) by mouth at bedtime.  . tamsulosin (FLOMAX) 0.4 MG CAPS capsule Take 1 capsule (0.4 mg total) by mouth at bedtime.  . Vitamin D, Ergocalciferol, (DRISDOL) 1.25 MG (50000 UNIT) CAPS capsule Take 1 capsule (50,000 Units total) by mouth every 7 (seven) days.   Facility-Administered Encounter Medications as of 07/05/2020  Medication  . cyanocobalamin ((VITAMIN B-12)) injection 1,000 mcg    Patient Active Problem List   Diagnosis  Date Noted  . Haglund's deformity of right heel 06/27/2019  . [facility] spotted fever) 07/02/2018  . Chronic pain of right knee 04/14/2018  . Heel pain, chronic, right 03/19/2018  . Foot pain, bilateral 03/19/2018  . Kidney stone 03/19/2018  . Arthritis of back 08/25/2016  . AAA (abdominal aortic aneurysm) without rupture ([place]) 07/13/2016  . Lumbar spondylosis with myelopathy 07/13/2016  . Diabetes mellitus ([place]) 09/18/2015  .  Metabolic syndrome 32/95/1884  . Vitamin B 12 deficiency 12/14/2014  . Hereditary and idiopathic peripheral neuropathy 10/17/2014  . Vitamin D deficiency 06/29/2014  . BPH (benign prostatic hyperplasia) 06/29/2014  . Hypertension 11/17/2012  . Hyperlipidemia 11/17/2012  . DDD (degenerative disc disease), lumbar 11/17/2012  . Thrombocytopenia ([place])     Conditions to be addressed/monitored:AAA, HTN, HLD, DDD, arthritis, DM  Care Plan : RNCM: Diabetes Type 2 (Adult)  Updates made by Ilean China, RN since 07/06/2020 12:00 AM    Problem: Disease Progression (Diabetes, Type 2)     Long-Range Goal: Prevent Diabetes Complications   This Visit's Progress: On track  Priority: Medium  Note:   Current Barriers:  . Chronic Disease Management support and education needs related to diabetes . Lacks caregiver support.   Nurse Case Manager Clinical Goal(s):  Marland Kitchen Over the next 60 days, patient will work with Consulting civil engineer to address needs related to diabetes management . Over the next 90 days, patient will verbalize basic understanding of diabetes disease process and self health management plan as evidenced by maintaining an A1C below 7  Interventions:  . 1:1 collaboration with Sharion Balloon, FNP regarding development and update of comprehensive plan of care as evidenced by provider attestation and co-signature . Inter-disciplinary care team collaboration (see longitudinal plan of care) . Evaluation of current treatment plan related to diabetes and patient's adherence to plan as established by provider. . Chart reviewed including relevant office notes and lab results . Discussed recent A1C results . Reviewed medications o No medications for diabetes . Does not routinely check blood sugar at home . Discussed diet o Limit sugar and other simple carbohydrates . Encouraged to check feet daily for sores or calluses and to call PCP with any abnormal findings . Encouraged to have yearly eye  exams to check for diabetic retinopathy . Reviewed upcoming appointments . Provided with RN Care Manager contact number and encouraged to reach out as needed  Patient Goals/Self-Care Activities Over the next 60 days, patient will: . Check feet daily for sores, cracks, or calluses . Let PCP know if you find anything wrong with your feel . Limit the amount of sugar and simple carbohydrates in your diet . Have a yearly eye exam to check for changes that can happen from diabetes and high blood pressure . Keep regular follow-up appointments with your PCP    Care Plan : RNCM: Hypertension (Adult)  Updates made by Ilean China, RN since 07/06/2020 12:00 AM    Problem: Hypertension (Hypertension)     Long-Range Goal: Hypertension Monitored   This Visit's Progress: On track  Priority: Medium  Note:   Current Barriers:  . Chronic Disease Management support and education needs related to hypertension . Lacks caregiver support  Nurse Case Manager Clinical Goal(s):  Marland Kitchen Over the next 30 days, patient will meet with RN Care Manager to address hypertension management  Interventions:  . 1:1 collaboration with Sharion Balloon, FNP regarding development and update of comprehensive plan of care as evidenced by provider  attestation and co-signature . Inter-disciplinary care team collaboration (see longitudinal plan of care) . Evaluation of current treatment plan related to hypertension and patient's adherence to plan as established by provider. . Chart reviewed including relevant office notes and lab results . Reviewed most recent in-office blood pressure readings . Reviewed and discussed medications o Patient is compliant with medications . Discussed home blood pressure readings o Patient checks blood pressure at least 3 times a week . Encouraged patient to continue check and recording blood pressure at least 3 times a week and to bring log to PCP appointments . Advised patient to call PCP with  any readings outside of recommended range . Provided with RN Care Manager contact number and encouraged to reach out as needed  Patient Goals/Self-Care Activities Over the next 60 days, patient will: . check blood pressure daily . write blood pressure results in a log or diary  . Take blood pressure log to PCP appointments . Call PCP with any readings outside of recommended range . Take medications as directed . Limit the amount of sodium in your diet        Follow Up Plan:  . Telephone follow up appointment with care management team member scheduled for: 08/10/20 with RN Care Manager . The patient has been provided with contact information for the care management team and has been advised to call with any health related questions or concerns.  . Next PCP appointment scheduled for: 10/17/20 with [name], FNP  Chong Sicilian, BSN, RN-BC [place] / [place] Management Direct Dial: [phone]

## 2020-07-06 NOTE — Patient Instructions (Addendum)
Visit Information  Goals Addressed            This Visit's Progress   . Prevent Diabetes Complications       Timeframe:  Long-Range Goal Priority:  High Start Date:                             Expected End Date:                       Follow Up Date 08/10/20    . Check feet daily for sores, cracks, or calluses . Let PCP know if you find anything wrong with your feel . Limit the amount of sugar and simple carbohydrates in your diet . Have a yearly eye exam to check for changes that can happen from diabetes and high blood pressure . Keep regular follow-up appointments with your PCP     . Track and Manage My Blood Pressure-Hypertension       Timeframe:  Long-Range Goal Priority:  Medium Start Date:                             Expected End Date:                       Follow Up Date 08/10/20    . check blood pressure daily . write blood pressure results in a log or diary  . Take blood pressure log to PCP appointments . Call PCP with any readings outside of recommended range . Take medications as directed . Limit the amount of sodium in your diet   Why is this important?    You won't feel high blood pressure, but it can still hurt your blood vessels.   High blood pressure can cause heart or kidney problems. It can also cause a stroke.   Making lifestyle changes like losing a little weight or eating less salt will help.   Checking your blood pressure at home and at different times of the day can help to control blood pressure.   If the doctor prescribes medicine remember to take it the way the doctor ordered.   Call the office if you cannot afford the medicine or if there are questions about it.     Notes:        Patient verbalizes understanding of instructions provided today.  Follow Up Plan:  . Telephone follow up appointment with care management team member scheduled for: 08/10/20 with RN Care Manager . The patient has been provided with contact information for  the care management team and has been advised to call with any health related questions or concerns.  . Next PCP appointment scheduled for: 10/17/20 with [name], FNP  Chong Sicilian, BSN, RN-BC [place] / [place] Management Direct Dial: [phone]

## 2020-07-10 ENCOUNTER — Ambulatory Visit: Payer: PPO | Admitting: *Deleted

## 2020-07-10 NOTE — Patient Instructions (Signed)
  Follow up plan: Patient will see Marjorie Smolder, FNP on 07/12/20 for an office visit RNCM will f/u with patient over the next 7 days  Chong Sicilian, BSN, RN-BC [place] / [place] Management Direct Dial: [phone]

## 2020-07-10 NOTE — Chronic Care Management (AMB) (Signed)
  Chronic Care Management   Care Coordination Note  07/10/2020 Name: Lonnie Hurley MRN: [id number] DOB: [birth year]  Incoming call from [name] regarding cough and congestion. He has talked with [facility] triage nurse and is scheduled for a visit with Marjorie Smolder, FNP on 07/12/20 for an evaluation and Covid testing. Patient reports productive cough but no shortness of breath or chest pain.  Recommended plain mucinex (use as directed on the bottle) and to drink plenty of water. Patient reports that he is drinking a lot of water now. Advised to reach out to PCP office at [phone] during the day or after hours if he has any new or worsening symptoms and to call 911 for any symptoms of an emergency like chest pain or shortness of breath. Patient stated understanding and agreement to plan.    Follow up plan: Patient will see Marjorie Smolder, FNP on 07/12/20 for an office visit RNCM will f/u with patient over the next 7 days  Chong Sicilian, BSN, RN-BC [place] / [place] Management Direct Dial: [phone]

## 2020-07-12 ENCOUNTER — Ambulatory Visit (INDEPENDENT_AMBULATORY_CARE_PROVIDER_SITE_OTHER): Payer: PPO | Admitting: Family Medicine

## 2020-07-12 ENCOUNTER — Encounter: Payer: Self-pay | Admitting: Family Medicine

## 2020-07-12 VITALS — BP 113/61 | HR 76 | Temp 97.0°F | Ht 66.0 in

## 2020-07-12 DIAGNOSIS — R0989 Other specified symptoms and signs involving the circulatory and respiratory systems: Secondary | ICD-10-CM | POA: Diagnosis not present

## 2020-07-12 DIAGNOSIS — R059 Cough, unspecified: Secondary | ICD-10-CM | POA: Diagnosis not present

## 2020-07-12 MED ORDER — DOXYCYCLINE HYCLATE 100 MG PO TABS: 100.0000 mg | ORAL_TABLET | Freq: Two times a day (BID) | ORAL | 0 refills | Status: AC

## 2020-07-12 NOTE — Progress Notes (Signed)
Established Patient Office Visit  Subjective:  Patient ID: Lonnie Hurley, male    DOB: 1940/09/20  Age: 80 y.o. MRN: [id number]  CC:  Chief Complaint  Patient presents with  . Cough    HPI Lonnie Hurley presents for cough. His cough is productive with white-yellow sputum. He has been taking mucinex for the last couple days and has noticed an improvement. He denies fever. He denies shortness of breath, wheezing, chest pain, body aches, or chills. Denies sore throat or fatigue. He denies known exposure to Covid and has been vaccinated and had his booster. He also reports some mild head congestion.   Past Medical History:  Diagnosis Date  . BPH (benign prostatic hypertrophy)    Dr. Rosana Hoes / Dr. Joelyn Oms  - Urologist   . Cancer [facility])    lip-mole surgery  . Cataract    bilateral  . Colon polyps   . Constipation    occasional - miralax prn  . DDD (degenerative disc disease)   . Hearing loss    left ear, no hearing aids  . Hyperlipidemia   . Hypertension   . Leukoplakia   . Microscopic hematuria    negative work up with Urology in the past.   . OAB (overactive bladder)    with past percutaneous tibial nerve stimulation therapy  . Thrombocytopenia ([place])   . Tinnitus   . Tobacco abuse   . Vitamin B 12 deficiency   . Vitamin D deficiency     Past Surgical History:  Procedure Laterality Date  . CATARACT EXTRACTION  2007   right eye  . CATARACT EXTRACTION Left 03/21/15  . COLONOSCOPY  02/28/2014  . HERNIA REPAIR  1980  . KNEE SURGERY  02-28-2008   replaced inside right knee  . Left Knee Repair  11-10-2006  . NOSE SURGERY    . POLYPECTOMY     colon polyps  . SKIN BIOPSY  10-08-2006   left ear    Family History  Problem Relation Age of Onset  . Heart failure Father   . Glaucoma Mother   . Osteoporosis Brother   . Heart disease Brother 46       stent  . Hypertension Brother   . Hyperlipidemia Brother   . Diabetes Brother   . Colon cancer Neg Hx   . Stomach cancer  Neg Hx   . Rectal cancer Neg Hx     Social History   Socioeconomic History  . Marital status: Single    Spouse name: Not on file  . Number of children: 0  . Years of education: 10  . Highest education level: 10th grade  Occupational History  . Occupation: Retired    Comment: retired Immunologist  Tobacco Use  . Smoking status: Former Smoker    Packs/day: 1.00    Years: 50.00    Pack years: 50.00    Types: Cigarettes    Quit date: 08/14/2009    Years since quitting: 10.9  . Smokeless tobacco: Never Used  Vaping Use  . Vaping Use: Never used  Substance and Sexual Activity  . Alcohol use: No  . Drug use: No  . Sexual activity: Not Currently  Other Topics Concern  . Not on file  Social History Narrative   Retired Immunologist   Lives at home with Brother    Social Determinants of Health   Financial Resource Strain: Low Risk   . Difficulty of Paying Living Expenses: Not hard at all  Food Insecurity: No Food Insecurity  . Worried About Charity fundraiser in the Last Year: Never true  . Ran Out of Food in the Last Year: Never true  Transportation Needs: No Transportation Needs  . Lack of Transportation (Medical): No  . Lack of Transportation (Non-Medical): No  Physical Activity: Inactive  . Days of Exercise per Week: 0 days  . Minutes of Exercise per Session: 0 min  Stress: No Stress Concern Present  . Feeling of Stress : Not at all  Social Connections: Moderately Isolated  . Frequency of Communication with Friends and Family: More than three times a week  . Frequency of Social Gatherings with Friends and Family: Once a week  . Attends Religious Services: More than 4 times per year  . Active Member of Clubs or Organizations: No  . Attends Archivist Meetings: Never  . Marital Status: Never married  Intimate Partner Violence: Not At Risk  . Fear of Current or Ex-Partner: No  . Emotionally Abused: No  . Physically Abused: No  .  Sexually Abused: No    Outpatient Medications Prior to Visit  Medication Sig Dispense Refill  . acetaminophen (TYLENOL) 500 MG tablet Take 500 mg by mouth every 6 (six) hours as needed.    . Calcium Carb-Cholecalciferol (CALCIUM 600 + D PO) Take by mouth 2 (two) times daily.     . Cholecalciferol (VITAMIN D3) 2000 UNITS TABS Take 1 tablet by mouth daily.    . diclofenac Sodium (VOLTAREN) 1 % GEL Apply 2 g topically 4 (four) times daily. 350 g 1  . ezetimibe (ZETIA) 10 MG tablet Take 1 tablet (10 mg total) by mouth daily. 90 tablet 3  . gabapentin (NEURONTIN) 100 MG capsule TAKE 1 CAPSULE BY MOUTH THREE TIMES A DAY 90 capsule 0  . Glucosamine-Chondroit-Vit C-Mn (GLUCOSAMINE 1500 COMPLEX PO) Take by mouth 2 (two) times daily.     Marland Kitchen glucose blood (ONE TOUCH ULTRA TEST) test strip Use as instructed 50 each 11  . hydrochlorothiazide (HYDRODIURIL) 12.5 MG tablet Take 1 tablet (12.5 mg total) by mouth daily. 90 tablet 3  . meloxicam (MOBIC) 7.5 MG tablet Take 1 tablet (7.5 mg total) by mouth daily. 20 tablet 1  . Multiple Vitamins-Minerals (MULTIVITAMIN WITH MINERALS) tablet Take 1 tablet by mouth daily.    Marland Kitchen oxybutynin (DITROPAN-XL) 10 MG 24 hr tablet Take 10 mg by mouth daily.    . polyethylene glycol powder (MIRALAX) 17 GM/SCOOP powder Take 17 g by mouth as needed for moderate constipation. 1700 g 2  . predniSONE (DELTASONE) 20 MG tablet Take 2 tablets daily with breakfast. 10 tablet 0  . ramipril (ALTACE) 10 MG capsule Take 1 capsule (10 mg total) by mouth daily. 90 capsule 3  . simvastatin (ZOCOR) 40 MG tablet Take 1 tablet (40 mg total) by mouth at bedtime. 90 tablet 0  . tamsulosin (FLOMAX) 0.4 MG CAPS capsule Take 1 capsule (0.4 mg total) by mouth at bedtime. 90 capsule 3  . Vitamin D, Ergocalciferol, (DRISDOL) 1.25 MG (50000 UNIT) CAPS capsule Take 1 capsule (50,000 Units total) by mouth every 7 (seven) days. 12 capsule 3   Facility-Administered Medications Prior to Visit  Medication Dose  Route Frequency Provider Last Rate Last Admin  . cyanocobalamin ((VITAMIN B-12)) injection 1,000 mcg  1,000 mcg Intramuscular Q30 days [name] A, FNP   1,000 mcg at 07/05/20 1505    Allergies  Allergen Reactions  . Penicillins Other (See Comments)  unknown  . Aspirin Other (See Comments)    Causes Blood in urine  . Crestor [Rosuvastatin Calcium] Other (See Comments)    Myalgia, "joint problem"  . Gabapentin Other (See Comments)    Blurry vision Pt able to take in small doses   . Hydrocodone Other (See Comments)    Constipation    . Lyrica [Pregabalin]     Blurry vision     ROS Review of Systems As per HPI.    Objective:    Physical Exam Vitals and nursing note reviewed.  Constitutional:      Appearance: Normal appearance.  HENT:     Nose: Congestion present.     Mouth/Throat:     Mouth: Mucous membranes are moist.     Pharynx: Oropharynx is clear.  Eyes:     Conjunctiva/sclera: Conjunctivae normal.  Neck:     Vascular: No carotid bruit.  Cardiovascular:     Rate and Rhythm: Normal rate and regular rhythm.     Heart sounds: Murmur heard.  No friction rub. No gallop.   Pulmonary:     Effort: Pulmonary effort is normal. No respiratory distress.     Breath sounds: Normal breath sounds. No stridor. No wheezing, rhonchi or rales.  Abdominal:     General: Bowel sounds are normal.     Palpations: Abdomen is soft.  Musculoskeletal:     Cervical back: Neck supple. No tenderness.     Right lower leg: No edema.     Left lower leg: No edema.  Skin:    General: Skin is warm and dry.  Neurological:     General: No focal deficit present.     Mental Status: He is alert and oriented to person, place, and time.  Psychiatric:        Mood and Affect: Mood normal.        Behavior: Behavior normal.     BP 113/61   Pulse 76   Temp (!) 97 F (36.1 C) (Temporal)   Ht 5\' 6"  (1.676 m)   SpO2 94%   BMI 39.45 kg/m  Wt Readings from Last 3 Encounters:  04/19/20  244 lb 6.4 oz (110.9 kg)  11/30/19 255 lb (115.7 kg)  11/10/19 252 lb (114.3 kg)     There are no preventive care reminders to display for this patient.  There are no preventive care reminders to display for this patient.  Lab Results  Component Value Date   TSH 1.920 07/14/2019   Lab Results  Component Value Date   WBC 10.0 04/19/2020   HGB 14.2 04/19/2020   HCT 41.9 04/19/2020   MCV 88 04/19/2020   PLT 163 04/19/2020   Lab Results  Component Value Date   NA 142 04/19/2020   K 4.0 04/19/2020   CHLORIDE 105 09/01/2013   CO2 28 04/19/2020   GLUCOSE 117 (H) 04/19/2020   BUN 15 04/19/2020   CREATININE 0.96 04/19/2020   BILITOT 0.4 04/19/2020   ALKPHOS 55 04/19/2020   AST 30 04/19/2020   ALT 33 04/19/2020   PROT 6.3 04/19/2020   ALBUMIN 3.8 04/19/2020   CALCIUM 9.4 04/19/2020   ANIONGAP 9 09/01/2013   Lab Results  Component Value Date   CHOL 106 10/18/2019   Lab Results  Component Value Date   HDL 29 (L) 10/18/2019   Lab Results  Component Value Date   LDLCALC 36 10/18/2019   Lab Results  Component Value Date   TRIG 266 (H) 10/18/2019   Lab  Results  Component Value Date   CHOLHDL 3.7 10/18/2019   Lab Results  Component Value Date   HGBA1C 6.5 04/19/2020      Assessment & Plan:   Sahel was seen today for cough.  Diagnoses and all orders for this visit:  Cough/chest congestion Covid test pending, quarantine until test results. Will treat with antibiotics. Lungs clear. Return to office for new or worsening symptoms, or if symptoms persist.  -     Novel Coronavirus, NAA ([facility]) -     doxycycline (VIBRA-TABS) 100 MG tablet; Take 1 tablet (100 mg total) by mouth 2 (two) times daily for 7 days. 1 po bid   Follow-up: Return to office for new or worsening symptoms, or if symptoms persist.   The above assessment and management plan was discussed with the patient. The patient verbalized understanding of and has agreed to the management plan.  Patient is aware to call the clinic if they develop any new symptoms or if symptoms fail to improve or worsen. Patient is aware when to return to the clinic for a follow-up visit. Patient educated on when it is appropriate to go to the emergency department.    Gwenlyn Perking, FNP

## 2020-07-13 ENCOUNTER — Telehealth: Payer: PPO

## 2020-07-13 LAB — NOVEL CORONAVIRUS, NAA: SARS-CoV-2, NAA: NOT DETECTED

## 2020-07-13 LAB — SARS-COV-2, NAA 2 DAY TAT

## 2020-07-17 ENCOUNTER — Telehealth: Payer: PPO

## 2020-07-18 ENCOUNTER — Ambulatory Visit: Payer: PPO | Admitting: *Deleted

## 2020-07-18 DIAGNOSIS — R059 Cough, unspecified: Secondary | ICD-10-CM

## 2020-07-26 ENCOUNTER — Other Ambulatory Visit: Payer: Self-pay

## 2020-07-26 ENCOUNTER — Ambulatory Visit: Payer: PPO

## 2020-07-30 ENCOUNTER — Telehealth: Payer: Self-pay | Admitting: *Deleted

## 2020-07-30 ENCOUNTER — Other Ambulatory Visit: Payer: Self-pay | Admitting: *Deleted

## 2020-07-30 DIAGNOSIS — M19012 Primary osteoarthritis, left shoulder: Secondary | ICD-10-CM

## 2020-07-30 DIAGNOSIS — M25512 Pain in left shoulder: Secondary | ICD-10-CM

## 2020-07-30 MED ORDER — DICLOFENAC SODIUM 1 % EX GEL: 2.0000 g | Freq: Four times a day (QID) | CUTANEOUS | 1 refills | Status: DC

## 2020-07-30 NOTE — Chronic Care Management (AMB) (Signed)
  Care Management   Note  07/30/2020 Name: Lonnie Hurley MRN: [id number] DOB: February 24, 1941  Lonnie Hurley is a 80 y.o. year old male who is a primary care patient of Sharion Balloon, FNP and is actively engaged with the care management team. I reached out to [name] by phone today to assist with re-scheduling a follow up visit with the RN Case Manager  Follow up plan: Telephone appointment with care management team member scheduled for:08/20/2020  [place] [facility]

## 2020-07-30 NOTE — Chronic Care Management (AMB) (Signed)
  Care Management   Note  07/30/2020 Name: Lonnie Hurley MRN: [id number] DOB: [birth year]  Lonnie Hurley is a 80 y.o. year old male who is a primary care patient of Sharion Balloon, FNP and is actively engaged with the care management team. I reached out to [name] by phone today to assist with re-scheduling a follow up visit with the RN Case Manager  Follow up plan: Unsuccessful telephone outreach attempt made. A HIPAA compliant phone message was left for the patient providing contact information and requesting a return call.  The care management team will reach out to the patient again over the next 7 days.  If patient returns call to provider office, please advise to call [place] at [phone].  [place] [facility]

## 2020-08-03 ENCOUNTER — Ambulatory Visit: Payer: PPO

## 2020-08-08 DIAGNOSIS — M1711 Unilateral primary osteoarthritis, right knee: Secondary | ICD-10-CM | POA: Diagnosis not present

## 2020-08-08 DIAGNOSIS — T8484XA Pain due to internal orthopedic prosthetic devices, implants and grafts, initial encounter: Secondary | ICD-10-CM | POA: Diagnosis not present

## 2020-08-08 DIAGNOSIS — M1611 Unilateral primary osteoarthritis, right hip: Secondary | ICD-10-CM | POA: Diagnosis not present

## 2020-08-08 DIAGNOSIS — Z96652 Presence of left artificial knee joint: Secondary | ICD-10-CM | POA: Diagnosis not present

## 2020-08-10 ENCOUNTER — Other Ambulatory Visit (HOSPITAL_COMMUNITY): Payer: Self-pay | Admitting: Otolaryngology

## 2020-08-10 ENCOUNTER — Telehealth: Payer: PPO

## 2020-08-10 ENCOUNTER — Other Ambulatory Visit: Payer: Self-pay | Admitting: Otolaryngology

## 2020-08-10 DIAGNOSIS — H903 Sensorineural hearing loss, bilateral: Secondary | ICD-10-CM

## 2020-08-14 NOTE — Chronic Care Management (AMB) (Signed)
°  Chronic Care Management   Note  07/18/2020 Name: Lonnie Hurley MRN: [id number] DOB: [birth year]  Telephone follow-up with patient regarding URI. Patient reports that Mucinex helped to thin and decrease secretions and his cough has mostly resolved. He had a telephone visit with PCP office is taking Augmentin with no complaints.   Follow up plan: The patient has been provided with contact information for the care management team and has been advised to call with any health related questions or concerns.  The care management team will reach out to the patient again over the next 30 days.   Chong Sicilian, BSN, RN-BC Embedded Chronic Care Manager [facility] / [place] [facility] Direct Dial: [phone]

## 2020-08-17 ENCOUNTER — Telehealth: Payer: PPO

## 2020-08-20 ENCOUNTER — Ambulatory Visit (INDEPENDENT_AMBULATORY_CARE_PROVIDER_SITE_OTHER): Payer: PPO | Admitting: *Deleted

## 2020-08-20 DIAGNOSIS — E119 Type 2 diabetes mellitus without complications: Secondary | ICD-10-CM | POA: Diagnosis not present

## 2020-08-20 DIAGNOSIS — I10 Essential (primary) hypertension: Secondary | ICD-10-CM

## 2020-08-20 NOTE — Patient Instructions (Signed)
Visit Information  PATIENT GOALS: Goals Addressed            This Visit's Progress   . Hearing Improved   Not on track    Timeframe:  Short-Term Goal Priority:  Medium Start Date: 08/20/20                            Expected End Date: 11/20/20                      Follow-up: 10/02/20  . Schedule CT that was ordered by Dr Benjamine Mola . Follow-up with Dr Benjamine Mola . Call RN Care Manager [phone] as needed    . Prevent Diabetes Complications   On track    Timeframe:  Long-Range Goal Priority:  High Start Date:                             Expected End Date:                       Follow Up Date 08/10/20    . Check feet daily for sores, cracks, or calluses . Let PCP know if you find anything wrong with your feel . Limit the amount of sugar and simple carbohydrates in your diet . Have a yearly eye exam to check for changes that can happen from diabetes and high blood pressure . Keep regular follow-up appointments with your PCP     . Track and Manage My Blood Pressure-Hypertension   On track    Timeframe:  Long-Range Goal Priority:  Medium Start Date:                             Expected End Date:                       Follow Up Date 10/02/20    . check blood pressure at least 3 times a day . write blood pressure results in a log or diary  . Take blood pressure log to PCP appointments . Call PCP with any readings outside of recommended range . Take medications as directed . Limit the amount of sodium in your diet   Why is this important?    You won't feel high blood pressure, but it can still hurt your blood vessels.   High blood pressure can cause heart or kidney problems. It can also cause a stroke.   Making lifestyle changes like losing a little weight or eating less salt will help.   Checking your blood pressure at home and at different times of the day can help to control blood pressure.   If the doctor prescribes medicine remember to take it the way the doctor ordered.    Call the office if you cannot afford the medicine or if there are questions about it.     Notes:        The patient verbalized understanding of instructions, educational materials, and care plan provided today and declined offer to receive copy of patient instructions, educational materials, and care plan.   Follow Up Plan:  . Telephone follow up appointment with care management team member scheduled for: LCSW 09/18/20, [facility] 10/02/20 . The patient has been provided with contact information for the care management team and has been advised to call with any health  related questions or concerns.  . Next PCP appointment scheduled for: 10/17/20 with [name], FNP  Chong Sicilian, BSN, RN-BC [place] [facility] [place] [facility] Direct Dial: [phone]

## 2020-08-20 NOTE — Chronic Care Management (AMB) (Signed)
Chronic Care Management   CCM RN Visit Note  08/20/2020 Name: Lonnie Hurley MRN: [id number] DOB: [birth year]  Subjective: Lonnie Hurley is a 80 y.o. year old male who is a primary care patient of Lonnie Balloon, FNP. The care management team was consulted for assistance with disease management and care coordination needs.    Engaged with patient by telephone for follow up visit in response to provider referral for case management and/or care coordination services.   Consent to Services:  The patient was given information about Chronic Care Management services, agreed to services, and gave verbal consent prior to initiation of services.  Please see initial visit note for detailed documentation.   Patient agreed to services and verbal consent obtained.   Assessment: Review of patient past medical history, allergies, medications, health status, including review of consultants reports, laboratory and other test data, was performed as part of comprehensive evaluation and provision of chronic care management services.   SDOH (Social Determinants of Health) assessments and interventions performed:    CCM Care Plan  Allergies  Allergen Reactions  . Penicillins Other (See Comments)    unknown  . Aspirin Other (See Comments)    Causes Blood in urine  . Crestor [Rosuvastatin Calcium] Other (See Comments)    Myalgia, "joint problem"  . Gabapentin Other (See Comments)    Blurry vision Pt able to take in small doses   . Hydrocodone Other (See Comments)    Constipation    . Lyrica [Pregabalin]     Blurry vision     Outpatient Encounter Medications as of 08/20/2020  Medication Sig  . acetaminophen (TYLENOL) 500 MG tablet Take 500 mg by mouth every 6 (six) hours as needed.  . Calcium Carb-Cholecalciferol (CALCIUM 600 + D PO) Take by mouth 2 (two) times daily.   . Cholecalciferol (VITAMIN D3) 2000 UNITS TABS Take 1 tablet by mouth daily.  . diclofenac Sodium (VOLTAREN) 1 % GEL Apply 2 g  topically 4 (four) times daily.  Marland Kitchen ezetimibe (ZETIA) 10 MG tablet Take 1 tablet (10 mg total) by mouth daily.  Marland Kitchen gabapentin (NEURONTIN) 100 MG capsule TAKE 1 CAPSULE BY MOUTH THREE TIMES A DAY  . Glucosamine-Chondroit-Vit C-Mn (GLUCOSAMINE 1500 COMPLEX PO) Take by mouth 2 (two) times daily.   Marland Kitchen glucose blood (ONE TOUCH ULTRA TEST) test strip Use as instructed  . hydrochlorothiazide (HYDRODIURIL) 12.5 MG tablet Take 1 tablet (12.5 mg total) by mouth daily.  . meloxicam (MOBIC) 7.5 MG tablet Take 1 tablet (7.5 mg total) by mouth daily.  . Multiple Vitamins-Minerals (MULTIVITAMIN WITH MINERALS) tablet Take 1 tablet by mouth daily.  Marland Kitchen oxybutynin (DITROPAN-XL) 10 MG 24 hr tablet Take 10 mg by mouth daily.  . polyethylene glycol powder (MIRALAX) 17 GM/SCOOP powder Take 17 g by mouth as needed for moderate constipation.  . predniSONE (DELTASONE) 20 MG tablet Take 2 tablets daily with breakfast.  . ramipril (ALTACE) 10 MG capsule Take 1 capsule (10 mg total) by mouth daily.  . simvastatin (ZOCOR) 40 MG tablet Take 1 tablet (40 mg total) by mouth at bedtime.  . tamsulosin (FLOMAX) 0.4 MG CAPS capsule Take 1 capsule (0.4 mg total) by mouth at bedtime.  . Vitamin D, Ergocalciferol, (DRISDOL) 1.25 MG (50000 UNIT) CAPS capsule Take 1 capsule (50,000 Units total) by mouth every 7 (seven) days.   Facility-Administered Encounter Medications as of 08/20/2020  Medication  . cyanocobalamin ((VITAMIN B-12)) injection 1,000 mcg    Patient Active Problem List   Diagnosis Date  Noted  . Haglund's deformity of right heel 06/27/2019  . RMSF [facility] spotted fever) 07/02/2018  . Chronic pain of right knee 04/14/2018  . Heel pain, chronic, right 03/19/2018  . Foot pain, bilateral 03/19/2018  . Kidney stone 03/19/2018  . Arthritis of back 08/25/2016  . AAA (abdominal aortic aneurysm) without rupture ([place]) 07/13/2016  . Lumbar spondylosis with myelopathy 07/13/2016  . Diabetes mellitus ([place]) 09/18/2015  .  Metabolic syndrome 56/38/7564  . Vitamin B 12 deficiency 12/14/2014  . Hereditary and idiopathic peripheral neuropathy 10/17/2014  . Vitamin D deficiency 06/29/2014  . BPH (benign prostatic hyperplasia) 06/29/2014  . Hypertension 11/17/2012  . Hyperlipidemia 11/17/2012  . DDD (degenerative disc disease), lumbar 11/17/2012  . Thrombocytopenia ([place])     Conditions to be addressed/monitored:HTN, HLD and sensorineural hearing loss  Care Plan : RNCM: Diabetes Type 2 (Adult)  Updates made by Lonnie China, RN since 08/20/2020 12:00 AM    Problem: Disease Progression (Diabetes, Type 2)     Long-Range Goal: Prevent Diabetes Complications   This Visit's Progress: On track  Recent Progress: On track  Priority: Medium  Note:   Objective: Lab Results  Component Value Date   HGBA1C 6.5 04/19/2020   HGBA1C 7.5 (H) 10/18/2019   HGBA1C 6.3 06/22/2018   Lab Results  Component Value Date   LDLCALC 36 10/18/2019   CREATININE 0.96 04/19/2020   Current Barriers:  . Chronic Disease Management support and education needs related to diabetes . Lacks caregiver support.   Nurse Case Manager Clinical Goal(s):  Marland Kitchen Patient will work with RN Care Manager to address needs related to diabetes management . Patient will demonstrate ongoing self-management of diabetes as evidenced by maintaining an A1C below 7  Interventions:  . 1:1 collaboration with Lonnie Balloon, FNP regarding development and update of comprehensive plan of care as evidenced by provider attestation and co-signature . Inter-disciplinary care team collaboration (see longitudinal plan of care) . Evaluation of current treatment plan related to diabetes and patient's adherence to plan as established by provider. . Chart reviewed including relevant office notes and lab results o Last A1C in 04/2020 was 6.5 . Reviewed medications o No medications for diabetes . Does not routinely check blood sugar at home . Discussed diet o Limits  sugar and other simple carbohydrates . Encouraged to check feet daily for sores or calluses and to call PCP with any abnormal findings . Encouraged to have yearly eye exams to check for diabetic retinopathy . Reviewed upcoming appointments . Provided with RN Care Manager contact number and encouraged to reach out as needed  Patient Goals/Self-Care Activities Over the next 60 days, patient will: . Check feet daily for sores, cracks, or calluses . Let PCP know if you find anything wrong with your feel . Limit the amount of sugar and simple carbohydrates in your diet . Have a yearly eye exam to check for changes that can happen from diabetes and high blood pressure . Keep regular follow-up appointments with your PCP    Care Plan : RNCM: Hypertension (Adult)  Updates made by Lonnie China, RN since 08/20/2020 12:00 AM    Problem: Hypertension (Hypertension)     Long-Range Goal: Hypertension Monitored   This Visit's Progress: On track  Recent Progress: On track  Priority: Medium  Note:   Current Barriers:  . Chronic Disease Management support and education needs related to hypertension . Lacks family/social support  Nurse Case Manager Clinical Goal(s):  Marland Kitchen Patient will meet  with RN Care Manager to address self-management of hypertension management  Interventions:  . 1:1 collaboration with Lonnie Balloon, FNP regarding development and update of comprehensive plan of care as evidenced by provider attestation and co-signature . Inter-disciplinary care team collaboration (see longitudinal plan of care) . Evaluation of current treatment plan related to hypertension and patient's adherence to plan as established by provider. . Chart reviewed including relevant office notes and lab results . Reviewed most recent in-office blood pressure readings . Reviewed and discussed medications o Patient is compliant with medications . Discussed home blood pressure readings o Patient checks blood  pressure at least 3 times a week . Reinforced to continue check and recording blood pressure at least 3 times a week and to bring log to PCP appointments . Advised patient to call PCP with any readings outside of recommended range . Provided with RN Care Manager contact number and encouraged to reach out as needed  Patient Goals/Self-Care Activities Over the next 60 days, patient will: . check blood pressure at least 3 times a week . write blood pressure results in a log or diary  . Take blood pressure log to PCP appointments . Call PCP with any readings outside of recommended range . Take medications as directed . Limit the amount of sodium in your diet     Care Plan : RNCM: Sensorineural Hearing Loss  Updates made by Lonnie China, RN since 08/20/2020 12:00 AM    Problem: Hearing Loss   Priority: Medium    Goal: Hearing Improved   Start Date: 08/20/2020  This Visit's Progress: Not on track  Priority: Medium  Note:   Current Barriers:  . Care Coordination needs related to sensorineural hearing loss (SNHL) in a patient with hypertension and diabetes . Primary caregiver for brother. Lacks family/social support.  Nurse Case Manager Clinical Goal(s):  . patient will work with ENT to address needs related to medical management of sensorineural hearing loss . patient will meet with RN Care Manager to address self-management of hearing loss  Interventions:  . 1:1 collaboration with Lonnie Balloon, FNP regarding development and update of comprehensive plan of care as evidenced by provider attestation and co-signature . Inter-disciplinary care team collaboration (see longitudinal plan of care) . Evaluation of current treatment plan related to sensorineural hearing loss and patient's adherence to plan as established by provider. . Chart reviewed including relevant office notes, lab results, and orders o Pending CT head for asymmetrical SNHL ordered by ENT, Dr Benjamine Mola . Discussed hearing  loss, hearing tests, and hearing aid fittings o Patient tried temporary hearing aids but didn't find them comfortable o Mildly impacts daily life . Encouraged patient to schedule CT and to follow-up with ENT regarding hearing loss . Provided with RN Care Manager contact number and encouraged to reach out as needed  Patient Goals/Self-Care Activities Over the next 60 days, patient will: . Schedule CT that was ordered by Dr Benjamine Mola . Follow-up with Dr Benjamine Mola . Call RN Care Manager [phone] as needed    Follow Up Plan:  . Telephone follow up appointment with care management team member scheduled for: LCSW 09/18/20, [facility] 10/02/20 . The patient has been provided with contact information for the care management team and has been advised to call with any health related questions or concerns.  . Next PCP appointment scheduled for: 10/17/20 with [name], FNP  Chong Sicilian, BSN, RN-BC Embedded Chronic Care Manager [facility] / [place] Management Direct Dial:  [phone]         

## 2020-08-27 ENCOUNTER — Other Ambulatory Visit: Payer: Self-pay

## 2020-08-27 ENCOUNTER — Ambulatory Visit (INDEPENDENT_AMBULATORY_CARE_PROVIDER_SITE_OTHER): Payer: PPO | Admitting: *Deleted

## 2020-08-27 DIAGNOSIS — E538 Deficiency of other specified B group vitamins: Secondary | ICD-10-CM | POA: Diagnosis not present

## 2020-08-27 NOTE — Progress Notes (Signed)
Pt given B12 injection IM left deltoid and tolerated well. °

## 2020-08-29 DIAGNOSIS — M1711 Unilateral primary osteoarthritis, right knee: Secondary | ICD-10-CM | POA: Diagnosis not present

## 2020-08-30 DIAGNOSIS — D485 Neoplasm of uncertain behavior of skin: Secondary | ICD-10-CM | POA: Diagnosis not present

## 2020-08-30 DIAGNOSIS — L570 Actinic keratosis: Secondary | ICD-10-CM | POA: Diagnosis not present

## 2020-08-30 DIAGNOSIS — C44222 Squamous cell carcinoma of skin of right ear and external auricular canal: Secondary | ICD-10-CM | POA: Diagnosis not present

## 2020-08-30 DIAGNOSIS — Z85828 Personal history of other malignant neoplasm of skin: Secondary | ICD-10-CM | POA: Diagnosis not present

## 2020-08-30 DIAGNOSIS — L218 Other seborrheic dermatitis: Secondary | ICD-10-CM | POA: Diagnosis not present

## 2020-09-05 DIAGNOSIS — M1711 Unilateral primary osteoarthritis, right knee: Secondary | ICD-10-CM | POA: Diagnosis not present

## 2020-09-06 ENCOUNTER — Telehealth: Payer: Self-pay

## 2020-09-06 DIAGNOSIS — M79605 Pain in left leg: Secondary | ICD-10-CM

## 2020-09-06 MED ORDER — GABAPENTIN 100 MG PO CAPS: ORAL_CAPSULE | ORAL | 0 refills | Status: DC

## 2020-09-06 NOTE — Telephone Encounter (Signed)
  Prescription Request  09/06/2020  What is the name of the medication or equipment? Gabapentin  Have you contacted your pharmacy to request a refill? (if applicable) no  Which pharmacy would you like this sent to? [facility]--Mail Order for long term meds & [facility] for short term meds   Patient notified that their request is being sent to the clinical staff for review and that they should receive a response within 2 business days.   Lenna Gilford' pt  He has only about 20 pills left.  Please call him soon bc he gets confused.

## 2020-09-06 NOTE — Telephone Encounter (Signed)
Pt aware refill sent to mail order pharmacy 

## 2020-09-12 DIAGNOSIS — M1711 Unilateral primary osteoarthritis, right knee: Secondary | ICD-10-CM | POA: Diagnosis not present

## 2020-09-12 NOTE — Telephone Encounter (Signed)
Pt informed of Christy's recommendations. He will wait until his appt in May.States that he is not taking the full amount of Gabapentin anyway.

## 2020-09-12 NOTE — Telephone Encounter (Signed)
He was seen on 04/19/20, his next appt should be 6 months from then. We can check labs then. If he wants earlier, will need an appointment.

## 2020-09-18 ENCOUNTER — Telehealth: Payer: PPO

## 2020-09-19 DIAGNOSIS — Z85828 Personal history of other malignant neoplasm of skin: Secondary | ICD-10-CM | POA: Diagnosis not present

## 2020-09-19 DIAGNOSIS — C44222 Squamous cell carcinoma of skin of right ear and external auricular canal: Secondary | ICD-10-CM | POA: Diagnosis not present

## 2020-09-26 ENCOUNTER — Ambulatory Visit (INDEPENDENT_AMBULATORY_CARE_PROVIDER_SITE_OTHER): Payer: PPO | Admitting: Licensed Clinical Social Worker

## 2020-09-26 DIAGNOSIS — I10 Essential (primary) hypertension: Secondary | ICD-10-CM

## 2020-09-26 DIAGNOSIS — E785 Hyperlipidemia, unspecified: Secondary | ICD-10-CM

## 2020-09-26 DIAGNOSIS — M479 Spondylosis, unspecified: Secondary | ICD-10-CM

## 2020-09-26 DIAGNOSIS — I714 Abdominal aortic aneurysm, without rupture, unspecified: Secondary | ICD-10-CM

## 2020-09-26 DIAGNOSIS — E119 Type 2 diabetes mellitus without complications: Secondary | ICD-10-CM

## 2020-09-26 DIAGNOSIS — M5136 Other intervertebral disc degeneration, lumbar region: Secondary | ICD-10-CM

## 2020-09-26 NOTE — Chronic Care Management (AMB) (Signed)
Chronic Care Management    Clinical Social Work Note  09/26/2020 Name: Lonnie Hurley MRN: [id number] DOB: Sep 21, 1940  Lonnie Hurley is a 80 y.o. year old male who is a primary care patient of Sharion Balloon, FNP. The CCM team was consulted to assist the patient with chronic disease management and/or care coordination needs related to: [name] .   Engaged with patient by telephone for follow up visit in response to provider referral for social work chronic care management and care coordination services.   Consent to Services:  The patient was given information about Chronic Care Management services, agreed to services, and gave verbal consent prior to initiation of services.  Please see initial visit note for detailed documentation.   Patient agreed to services and consent obtained.   Assessment: Review of patient past medical history, allergies, medications, and health status, including review of relevant consultants reports was performed today as part of a comprehensive evaluation and provision of chronic care management and care coordination services.     SDOH (Social Determinants of Health) assessments and interventions performed:    Advanced Directives Status: See Vynca application for related entries.  CCM Care Plan  Allergies  Allergen Reactions  . Penicillins Other (See Comments)    unknown  . Aspirin Other (See Comments)    Causes Blood in urine  . Crestor [Rosuvastatin Calcium] Other (See Comments)    Myalgia, "joint problem"  . Gabapentin Other (See Comments)    Blurry vision Pt able to take in small doses   . Hydrocodone Other (See Comments)    Constipation    . Lyrica [Pregabalin]     Blurry vision     Outpatient Encounter Medications as of 09/26/2020  Medication Sig  . acetaminophen (TYLENOL) 500 MG tablet Take 500 mg by mouth every 6 (six) hours as needed.  . Calcium Carb-Cholecalciferol (CALCIUM 600 + D PO) Take by mouth 2 (two) times daily.    . Cholecalciferol (VITAMIN D3) 2000 UNITS TABS Take 1 tablet by mouth daily.  . diclofenac Sodium (VOLTAREN) 1 % GEL Apply 2 g topically 4 (four) times daily.  Marland Kitchen ezetimibe (ZETIA) 10 MG tablet Take 1 tablet (10 mg total) by mouth daily.  Marland Kitchen gabapentin (NEURONTIN) 100 MG capsule TAKE 1 CAPSULE BY MOUTH THREE TIMES A DAY  . Glucosamine-Chondroit-Vit C-Mn (GLUCOSAMINE 1500 COMPLEX PO) Take by mouth 2 (two) times daily.   Marland Kitchen glucose blood (ONE TOUCH ULTRA TEST) test strip Use as instructed  . hydrochlorothiazide (HYDRODIURIL) 12.5 MG tablet Take 1 tablet (12.5 mg total) by mouth daily.  . meloxicam (MOBIC) 7.5 MG tablet Take 1 tablet (7.5 mg total) by mouth daily.  . Multiple Vitamins-Minerals (MULTIVITAMIN WITH MINERALS) tablet Take 1 tablet by mouth daily.  Marland Kitchen oxybutynin (DITROPAN-XL) 10 MG 24 hr tablet Take 10 mg by mouth daily.  . polyethylene glycol powder (MIRALAX) 17 GM/SCOOP powder Take 17 g by mouth as needed for moderate constipation.  . predniSONE (DELTASONE) 20 MG tablet Take 2 tablets daily with breakfast.  . ramipril (ALTACE) 10 MG capsule Take 1 capsule (10 mg total) by mouth daily.  . simvastatin (ZOCOR) 40 MG tablet Take 1 tablet (40 mg total) by mouth at bedtime.  . tamsulosin (FLOMAX) 0.4 MG CAPS capsule Take 1 capsule (0.4 mg total) by mouth at bedtime.  . Vitamin D, Ergocalciferol, (DRISDOL) 1.25 MG (50000 UNIT) CAPS capsule Take 1 capsule (50,000 Units total) by mouth every 7 (seven) days.   Facility-Administered Encounter Medications as of  09/26/2020  Medication  . cyanocobalamin ((VITAMIN B-12)) injection 1,000 mcg    Patient Active Problem List   Diagnosis Date Noted  . Haglund's deformity of right heel 06/27/2019  . RMSF [facility] spotted fever) 07/02/2018  . Chronic pain of right knee 04/14/2018  . Heel pain, chronic, right 03/19/2018  . Foot pain, bilateral 03/19/2018  . Kidney stone 03/19/2018  . Arthritis of back 08/25/2016  . AAA (abdominal aortic  aneurysm) without rupture ([place]) 07/13/2016  . Lumbar spondylosis with myelopathy 07/13/2016  . Diabetes mellitus ([place]) 09/18/2015  . Metabolic syndrome 50/93/2671  . Vitamin B 12 deficiency 12/14/2014  . Hereditary and idiopathic peripheral neuropathy 10/17/2014  . Vitamin D deficiency 06/29/2014  . BPH (benign prostatic hyperplasia) 06/29/2014  . Hypertension 11/17/2012  . Hyperlipidemia 11/17/2012  . DDD (degenerative disc disease), lumbar 11/17/2012  . Thrombocytopenia ([place])     Conditions to be addressed/monitored: Monitor client completion of ADLs   Care Plan : LCSW care plan  Updates made by Katha Cabal, LCSW since 09/26/2020 12:00 AM    Problem: Functional Decline     Goal: Mobility and Function Maintained;Complete ADLs daily as able   Start Date: 09/26/2020  Expected End Date: 12/26/2020  This Visit's Progress: On track  Priority: Medium  Note:   Current barriers:   . Patient in need of assistance with connecting to community resources for possible help in completing his daily ADLs and other daily activities . Patient is unable to independently navigate community resource options without care coordination support . Mobility issues . Social isolation issues  Clinical Goals:  patient will work with SW monthly to address concerns related to ADLs completion of client and mobility challenges of client Patient will call LCSW in next 30 days to discuss social isolation issues of client   Clinical Interventions:  . Collaboration with Sharion Balloon, FNP regarding development and update of comprehensive plan of care as evidenced by provider attestation and co-signature . Assessment of needs, barriers of client  . Talked with client about pain issues of client . Talked with client about social work needs of client . Talked with client about upcoming  client medical appointments . Talked with client about history of knee surgery . Talked with client about medication  procurement for client . Talked with client about RNCM support through CCM program . Talked with client about mobility of client  Patient Coping Skills/Strengths: Completes ADLs daily as able Takes medications as prescribed Attends scheduled medical appointments  Patient Deficits  Some mobility challenges Reduced social contact with others  Patient Goals:   Patient will talk with LCSW in next 30 days about mobility of client and about client completion of daily ADLs Patient will socialize with others or talk with others 2-3 times weekly in next 30 days Patient will attend scheduled medical appointments in next 30 days -  Follow Up Plan: LCSW to call client or brother of client on 10/31/20     [name].Tonilynn Bieker MSW, LCSW Licensed Clinical Social Worker [facility] Care Management [phone]

## 2020-09-26 NOTE — Patient Instructions (Signed)
Visit Information  PATIENT GOALS: Goals Addressed            This Visit's Progress   . Complete ADLs daily as able       Timeframe:  Short-Term Goal Priority:  Medium Progress:  On Track Start Date:              09/26/20               Expected End Date:            12/26/20           Follow Up Date 10/31/20   Patient Coping Skills/Strengths: Completes ADLs daily as able Takes medications as prescribed Attends scheduled medical appointments  Patient Deficits  Some mobility challenges Reduced social contact with others  Patient Goals:   Patient will talk with LCSW in next 30 days about mobility of client and about client completion of daily ADLs Patient will socialize with others or talk with others 2-3 times weekly in next 30 days Patient will attend scheduled medical appointments in next 30 days -  Follow Up Plan: LCSW to call client or brother of client on 10/31/20     [name].Deshawna Mcneece MSW, LCSW Licensed Clinical Social Worker [facility] Care Management [phone]

## 2020-10-01 ENCOUNTER — Ambulatory Visit (INDEPENDENT_AMBULATORY_CARE_PROVIDER_SITE_OTHER): Payer: PPO

## 2020-10-01 ENCOUNTER — Other Ambulatory Visit: Payer: Self-pay

## 2020-10-01 DIAGNOSIS — Z0289 Encounter for other administrative examinations: Secondary | ICD-10-CM

## 2020-10-01 DIAGNOSIS — E538 Deficiency of other specified B group vitamins: Secondary | ICD-10-CM

## 2020-10-01 NOTE — Progress Notes (Signed)
B12 injection given to patient and tolerated well.  

## 2020-10-02 ENCOUNTER — Ambulatory Visit: Payer: PPO | Admitting: *Deleted

## 2020-10-02 ENCOUNTER — Encounter: Payer: Self-pay | Admitting: *Deleted

## 2020-10-02 DIAGNOSIS — I10 Essential (primary) hypertension: Secondary | ICD-10-CM | POA: Diagnosis not present

## 2020-10-02 DIAGNOSIS — E119 Type 2 diabetes mellitus without complications: Secondary | ICD-10-CM

## 2020-10-02 DIAGNOSIS — M479 Spondylosis, unspecified: Secondary | ICD-10-CM

## 2020-10-02 DIAGNOSIS — E785 Hyperlipidemia, unspecified: Secondary | ICD-10-CM

## 2020-10-10 DIAGNOSIS — M1711 Unilateral primary osteoarthritis, right knee: Secondary | ICD-10-CM | POA: Diagnosis not present

## 2020-10-12 NOTE — Patient Instructions (Signed)
Visit Information  PATIENT GOALS: Goals Addressed            This Visit's Progress   . Hearing Improved       Timeframe:  Short-Term Goal Priority:  Medium Start Date: 08/20/20                            Expected End Date: 11/20/20                      Follow-up: 11/13/20  . Schedule MRI that was ordered by Dr Benjamine Mola . Follow-up with Dr Benjamine Mola . Call RN Care Manager [phone] as needed    . Manage Hypertension   On track    Timeframe:  Long-Range Goal Priority:  Medium Start Date:                             Expected End Date:                       Follow-up: 11/13/20  . check blood pressure at least 3 times a week . write blood pressure results in a log or diary  . Take blood pressure log to PCP appointments . Call PCP with any readings outside of recommended range . Take medications as directed . Limit the amount of sodium in your diet    . Prevent Diabetes Complications       Timeframe:  Long-Range Goal Priority:  High Start Date:                             Expected End Date:                       Follow Up Date 11/13/20   . Check feet daily for sores, cracks, or calluses . Let PCP know if you find anything wrong with your feel . Limit the amount of sugar and simple carbohydrates in your diet . Have a yearly eye exam to check for changes that can happen from diabetes and high blood pressure . Keep regular follow-up appointments with your PCP        The patient verbalized understanding of instructions, educational materials, and care plan provided today and declined offer to receive copy of patient instructions, educational materials, and care plan.   Follow Up Plan:  . Telephone follow up appointment with care management team member scheduled for: [facility] 11/13/20 . The patient has been provided with contact information for the care management team and has been advised to call with any health related questions or concerns.  . Next PCP appointment scheduled for:  10/17/20 with [name], FNP  Chong Sicilian, BSN, RN-BC [place] / [place] Management Direct Dial: [phone]

## 2020-10-12 NOTE — Chronic Care Management (AMB) (Signed)
Chronic Care Management   CCM RN Visit Note  10/02/2020 Name: Lonnie Hurley MRN: [id number] DOB: [birth year]  Subjective: Lonnie Hurley is a 80 y.o. year old male who is a primary care patient of Sharion Balloon, FNP. The care management team was consulted for assistance with disease management and care coordination needs.    Engaged with patient by telephone for follow up visit in response to provider referral for case management and/or care coordination services.   Consent to Services:  The patient was given information about Chronic Care Management services, agreed to services, and gave verbal consent prior to initiation of services.  Please see initial visit note for detailed documentation.   Patient agreed to services and verbal consent obtained.   Assessment: Review of patient past medical history, allergies, medications, health status, including review of consultants reports, laboratory and other test data, was performed as part of comprehensive evaluation and provision of chronic care management services.   SDOH (Social Determinants of Health) assessments and interventions performed:    CCM Care Plan  Allergies  Allergen Reactions  . Penicillins Other (See Comments)    unknown  . Aspirin Other (See Comments)    Causes Blood in urine  . Crestor [Rosuvastatin Calcium] Other (See Comments)    Myalgia, "joint problem"  . Gabapentin Other (See Comments)    Blurry vision Pt able to take in small doses   . Hydrocodone Other (See Comments)    Constipation    . Lyrica [Pregabalin]     Blurry vision     Outpatient Encounter Medications as of 10/02/2020  Medication Sig  . acetaminophen (TYLENOL) 500 MG tablet Take 500 mg by mouth every 6 (six) hours as needed.  . Calcium Carb-Cholecalciferol (CALCIUM 600 + D PO) Take by mouth 2 (two) times daily.   . Cholecalciferol (VITAMIN D3) 2000 UNITS TABS Take 1 tablet by mouth daily.  . diclofenac Sodium (VOLTAREN) 1 % GEL Apply 2 g  topically 4 (four) times daily.  Marland Kitchen ezetimibe (ZETIA) 10 MG tablet Take 1 tablet (10 mg total) by mouth daily.  Marland Kitchen gabapentin (NEURONTIN) 100 MG capsule TAKE 1 CAPSULE BY MOUTH THREE TIMES A DAY  . Glucosamine-Chondroit-Vit C-Mn (GLUCOSAMINE 1500 COMPLEX PO) Take by mouth 2 (two) times daily.   Marland Kitchen glucose blood (ONE TOUCH ULTRA TEST) test strip Use as instructed  . hydrochlorothiazide (HYDRODIURIL) 12.5 MG tablet Take 1 tablet (12.5 mg total) by mouth daily.  . meloxicam (MOBIC) 7.5 MG tablet Take 1 tablet (7.5 mg total) by mouth daily.  . Multiple Vitamins-Minerals (MULTIVITAMIN WITH MINERALS) tablet Take 1 tablet by mouth daily.  Marland Kitchen oxybutynin (DITROPAN-XL) 10 MG 24 hr tablet Take 10 mg by mouth daily.  . polyethylene glycol powder (MIRALAX) 17 GM/SCOOP powder Take 17 g by mouth as needed for moderate constipation.  . predniSONE (DELTASONE) 20 MG tablet Take 2 tablets daily with breakfast.  . ramipril (ALTACE) 10 MG capsule Take 1 capsule (10 mg total) by mouth daily.  . simvastatin (ZOCOR) 40 MG tablet Take 1 tablet (40 mg total) by mouth at bedtime.  . tamsulosin (FLOMAX) 0.4 MG CAPS capsule Take 1 capsule (0.4 mg total) by mouth at bedtime.  . Vitamin D, Ergocalciferol, (DRISDOL) 1.25 MG (50000 UNIT) CAPS capsule Take 1 capsule (50,000 Units total) by mouth every 7 (seven) days.   Facility-Administered Encounter Medications as of 10/02/2020  Medication  . cyanocobalamin ((VITAMIN B-12)) injection 1,000 mcg    Patient Active Problem List   Diagnosis Date  Noted  . Haglund's deformity of right heel 06/27/2019  . RMSF [facility] spotted fever) 07/02/2018  . Chronic pain of right knee 04/14/2018  . Heel pain, chronic, right 03/19/2018  . Foot pain, bilateral 03/19/2018  . Kidney stone 03/19/2018  . Arthritis of back 08/25/2016  . AAA (abdominal aortic aneurysm) without rupture ([place]) 07/13/2016  . Lumbar spondylosis with myelopathy 07/13/2016  . Diabetes mellitus ([place]) 09/18/2015  .  Metabolic syndrome 78/29/5621  . Vitamin B 12 deficiency 12/14/2014  . Hereditary and idiopathic peripheral neuropathy 10/17/2014  . Vitamin D deficiency 06/29/2014  . BPH (benign prostatic hyperplasia) 06/29/2014  . Hypertension 11/17/2012  . Hyperlipidemia 11/17/2012  . DDD (degenerative disc disease), lumbar 11/17/2012  . Thrombocytopenia ([place])     Conditions to be addressed/monitored:HTN, DMII and hearing loss  Care Plan : RNCM: Diabetes Type 2 (Adult)  Updates made by Ilean China, RN since 78/29/2022 12:00 AM    Problem: Disease Progression (Diabetes, Type 2)     Long-Range Goal: Prevent Diabetes Complications   This Visit's Progress: On track  Recent Progress: On track  Priority: Medium  Note:   Objective: Lab Results  Component Value Date   HGBA1C 6.5 04/19/2020   HGBA1C 7.5 (H) 10/18/2019   HGBA1C 6.3 06/22/2018   Lab Results  Component Value Date   LDLCALC 36 10/18/2019   CREATININE 0.96 04/19/2020   Current Barriers:  . Chronic Disease Management support and education needs related to diabetes . Lacks caregiver support.   Nurse Case Manager Clinical Goal(s):  Marland Kitchen Patient will work with PCP regarding medical management of diabetes . Patient will work with RN Care Manager to address needs related to diabetes management . Patient will demonstrate ongoing self-management of diabetes as evidenced by maintaining an A1C below 7  Interventions:  . 1:1 collaboration with Sharion Balloon, FNP regarding development and update of comprehensive plan of care as evidenced by provider attestation and co-signature . Inter-disciplinary care team collaboration (see longitudinal plan of care) . Evaluation of current treatment plan related to diabetes and patient's adherence to plan as established by provider. . Chart reviewed including relevant office notes and lab results . Discussed most recent A1C . Reviewed medications o No medications for diabetes . Does not routinely  check blood sugar at home . Discussed diet o Limits sugar and other simple carbohydrates . Reinforced need to check feet daily for sores or calluses and to call PCP with any abnormal findings . Reinforced need to have yearly eye exams to check for diabetic retinopathy . Reviewed upcoming appointments . Provided with RN Care Manager contact number and encouraged to reach out as needed  Patient Goals/Self-Care Activities Over the next 60 days, patient will: . Check feet daily for sores, cracks, or calluses . Let PCP know if you find anything wrong with your feel . Limit the amount of sugar and simple carbohydrates in your diet . Have a yearly eye exam to check for changes that can happen from diabetes and high blood pressure . Keep regular follow-up appointments with your PCP    Care Plan : RNCM: Hypertension (Adult)  Updates made by Ilean China, RN since 78/29/2022 12:00 AM    Problem: Hypertension (Hypertension)     Long-Range Goal: Hypertension Monitored   This Visit's Progress: On track  Recent Progress: On track  Priority: Medium  Note:   Objective: BP Readings from Last 3 Encounters:  07/12/20 113/61  06/04/20 114/68  05/15/20 125/75   Current  Barriers:  . Chronic Disease Management support and education needs related to hypertension . Lacks family/social support  Nurse Case Manager Clinical Goal(s):  Marland Kitchen Patient will meet with RN Care Manager to address self-management of hypertension management . Patient will work with PCP regarding medical management of hypertension  Interventions:  . 1:1 collaboration with Sharion Balloon, FNP regarding development and update of comprehensive plan of care as evidenced by provider attestation and co-signature . Inter-disciplinary care team collaboration (see longitudinal plan of care) . Evaluation of current treatment plan related to hypertension and patient's adherence to plan as established by provider. . Chart reviewed  including relevant office notes and lab results . Reviewed most recent in-office blood pressure readings . Reviewed and discussed medications o Patient is compliant with medications . Discussed home blood pressure readings o Patient checks blood pressure at least 3 times a week . Reinforced to continue check and recording blood pressure at least 3 times a week and to bring log to PCP appointments . Advised patient to call PCP with any readings outside of recommended range . Provided with RN Care Manager contact number and encouraged to reach out as needed  Patient Goals/Self-Care Activities Over the next 60 days, patient will: . check blood pressure at least 3 times a week . write blood pressure results in a log or diary  . Take blood pressure log to PCP appointments . Call PCP with any readings outside of recommended range . Take medications as directed . Limit the amount of sodium in your diet     Care Plan : RNCM: Sensorineural Hearing Loss  Updates made by Ilean China, RN since 78/29/2022 12:00 AM    Problem: Hearing Loss   Priority: Medium    Goal: Hearing Improved   Start Date: 08/20/2020  This Visit's Progress: Not on track  Recent Progress: Not on track  Priority: Medium  Note:   Current Barriers:  . Care Coordination needs related to sensorineural hearing loss (SNHL) in a patient with hypertension and diabetes . Primary caregiver for brother. Lacks family/social support.  Nurse Case Manager Clinical Goal(s):  . patient will work with ENT to address needs related to medical management of sensorineural hearing loss . patient will meet with RN Care Manager to address self-management of hearing loss  Interventions:  . 1:1 collaboration with Sharion Balloon, FNP regarding development and update of comprehensive plan of care as evidenced by provider attestation and co-signature . Inter-disciplinary care team collaboration (see longitudinal plan of care) . Evaluation of  current treatment plan related to sensorineural hearing loss and patient's adherence to plan as established by provider. . Chart reviewed including relevant office notes, lab results, and orders o Pending MRI head for asymmetrical SNHL ordered by ENT, Dr Benjamine Mola . Discussed hearing loss, hearing tests, and hearing aid fittings o Patient tried temporary hearing aids but didn't find them comfortable o Mildly impacts daily life . Encouraged patient to schedule MRI and to follow-up with ENT regarding hearing loss . Provided with RN Care Manager contact number and encouraged to reach out as needed  Patient Goals/Self-Care Activities Over the next 60 days, patient will: . Schedule MRI that was ordered by Dr Benjamine Mola . Follow-up with Dr Benjamine Mola . Call RN Care Manager [phone] as needed    Follow Up Plan:  . Telephone follow up appointment with care management team member scheduled for: [facility] 11/13/20 . The patient has been provided with contact information for the care management team and  has been advised to call with any health related questions or concerns.  . Next PCP appointment scheduled for: 10/17/20 with [name], FNP  Chong Sicilian, BSN, RN-BC [place] [facility] [place] Management Direct Dial: [phone]

## 2020-10-17 ENCOUNTER — Other Ambulatory Visit: Payer: Self-pay

## 2020-10-17 ENCOUNTER — Ambulatory Visit (INDEPENDENT_AMBULATORY_CARE_PROVIDER_SITE_OTHER): Payer: PPO | Admitting: Family

## 2020-10-17 ENCOUNTER — Encounter: Payer: Self-pay | Admitting: Family

## 2020-10-17 VITALS — BP 112/70 | HR 81 | Temp 97.6°F | Ht 66.0 in | Wt 246.0 lb

## 2020-10-17 DIAGNOSIS — E538 Deficiency of other specified B group vitamins: Secondary | ICD-10-CM

## 2020-10-17 DIAGNOSIS — M4716 Other spondylosis with myelopathy, lumbar region: Secondary | ICD-10-CM

## 2020-10-17 DIAGNOSIS — E782 Mixed hyperlipidemia: Secondary | ICD-10-CM

## 2020-10-17 DIAGNOSIS — E559 Vitamin D deficiency, unspecified: Secondary | ICD-10-CM | POA: Diagnosis not present

## 2020-10-17 DIAGNOSIS — I10 Essential (primary) hypertension: Secondary | ICD-10-CM

## 2020-10-17 DIAGNOSIS — E1169 Type 2 diabetes mellitus with other specified complication: Secondary | ICD-10-CM

## 2020-10-17 DIAGNOSIS — N401 Enlarged prostate with lower urinary tract symptoms: Secondary | ICD-10-CM

## 2020-10-17 LAB — BAYER DCA HB A1C WAIVED: HB A1C (BAYER DCA - WAIVED): 7.2 % — ABNORMAL HIGH (ref <7.0)

## 2020-10-17 NOTE — Progress Notes (Signed)
Subjective:    Patient ID: Lonnie Hurley, male    DOB: Jul 27, 1940, 80 y.o.   MRN: [id number]  Chief Complaint  Patient presents with  . Diabetes  . Hypertension   Pt presents to the office today to chronic follow up. He is followed by Urologists every 6 months for BPH. Followed by Cardiologists annually.  He is followed by Dermatologists every 6 months. He is followed by Ortho for osteoarthritis for bilateral knee pain and getting steroid injections.  Diabetes He presents for his follow-up diabetic visit. He has type 2 diabetes mellitus. His disease course has been stable. There are no hypoglycemic associated symptoms. Pertinent negatives for diabetes include no blurred vision and no foot paresthesias. Symptoms are stable. Diabetic complications include heart disease. Pertinent negatives for diabetic complications include no CVA. Risk factors for coronary artery disease include dyslipidemia, diabetes mellitus, hypertension and male sex. He is following a generally unhealthy diet. His overall blood glucose range is 140-180 mg/dl. An ACE inhibitor/angiotensin II receptor blocker is being taken.  Hypertension This is a chronic problem. The current episode started more than 1 year ago. The problem has been waxing and waning since onset. The problem is controlled. Pertinent negatives include no blurred vision or malaise/fatigue. Risk factors for coronary artery disease include dyslipidemia, diabetes mellitus and obesity. The current treatment provides moderate improvement. There is no history of CVA or heart failure.  Arthritis Presents for follow-up visit. He complains of pain and stiffness. The symptoms have been stable. Affected locations include the left knee, right knee, left MCP and right MCP (back). His pain is at a severity of 9/10.  Benign Prostatic Hypertrophy This is a chronic problem. The current episode started more than 1 year ago. Irritative symptoms include nocturia (1-2).       Review of Systems  Constitutional: Negative for malaise/fatigue.  Eyes: Negative for blurred vision.  Genitourinary: Positive for nocturia (1-2).  Musculoskeletal: Positive for arthritis and stiffness.  All other systems reviewed and are negative.      Objective:   Physical Exam Vitals reviewed.  Constitutional:      General: He is not in acute distress.    Appearance: He is well-developed. He is obese.  HENT:     Head: Normocephalic.     Right Ear: Tympanic membrane normal.     Left Ear: Tympanic membrane normal.  Eyes:     General:        Right eye: No discharge.        Left eye: No discharge.     Pupils: Pupils are equal, round, and reactive to light.  Neck:     Thyroid: No thyromegaly.  Cardiovascular:     Rate and Rhythm: Normal rate and regular rhythm.     Heart sounds: Murmur heard.    Pulmonary:     Effort: Pulmonary effort is normal. No respiratory distress.     Breath sounds: Normal breath sounds. No wheezing.  Abdominal:     General: Bowel sounds are normal. There is no distension.     Palpations: Abdomen is soft.     Tenderness: There is no abdominal tenderness.  Musculoskeletal:        General: No tenderness.     Cervical back: Normal range of motion and neck supple.     Comments: Pain in lumbar with flexion, using cane to walk  Skin:    General: Skin is warm and dry.     Findings: No erythema or rash.  Neurological:  Mental Status: He is alert and oriented to person, place, and time.     Cranial Nerves: No cranial nerve deficit.     Motor: Weakness present.     Gait: Gait abnormal.     Deep Tendon Reflexes: Reflexes are normal and symmetric.  Psychiatric:        Behavior: Behavior normal.        Thought Content: Thought content normal.        Judgment: Judgment normal.          BP 112/70   Pulse 81   Temp 97.6 F (36.4 C) (Temporal)   Ht 5' 6"  (1.676 m)   Wt 246 lb (111.6 kg)   SpO2 91%   BMI 39.71 kg/m   Assessment &  Plan:  Gianni Mihalik comes in today with chief complaint of Diabetes and Hypertension   Diagnosis and orders addressed:  1. Primary hypertension - CMP14+EGFR - CBC with Differential/Platelet  2. Type 2 diabetes mellitus with other specified complication, without long-term current use of insulin (HCC) - Bayer DCA Hb A1c Waived - CMP14+EGFR - CBC with Differential/Platelet - Microalbumin / creatinine urine ratio  3. Lumbar spondylosis with myelopathy - CMP14+EGFR - CBC with Differential/Platelet  4. Benign prostatic hyperplasia with lower urinary tract symptoms, symptom details unspecifie - CMP14+EGFR - CBC with Differential/Platelet  5. Mixed hyperlipidemia - CMP14+EGFR - CBC with Differential/Platelet - Lipid panel  6. Vitamin B 12 deficiency - CMP14+EGFR - CBC with Differential/Platelet  7. Vitamin D deficiency - CMP14+EGFR - CBC with Differential/Platelet   Labs pending Health Maintenance reviewed Diet and exercise encouraged  Follow up plan: 6 months    [name], FNP

## 2020-10-17 NOTE — Patient Instructions (Signed)
Hemoglobin A1C Test Why am I having this test? You may have the hemoglobin A1C test (A1C test) done to:  Evaluate your risk for developing diabetes (diabetes mellitus).  Diagnose diabetes.  Monitor long-term control of blood sugar (glucose) in people who have diabetes and help make treatment decisions. This test may be done with other blood glucose tests, such as fasting blood glucose and oral glucose tolerance tests. What is being tested? Hemoglobin is a type of protein in the blood that carries oxygen. Glucose attaches to hemoglobin to form glycated hemoglobin. This test checks the amount of glycated hemoglobin in your blood, which is a good indicator of the average amount of glucose in your blood during the past 2-3 months. What kind of sample is taken? A blood sample is required for this test. It is usually collected by inserting a needle into a blood vessel.   Tell a health care provider about:  All medicines you are taking, including vitamins, herbs, eye drops, creams, and over-the-counter medicines.  Any blood disorders you have.  Any surgeries you have had.  Any medical conditions you have.  Whether you are pregnant or may be pregnant. How are the results reported? Your results will be reported as a percentage that indicates how much of your hemoglobin has glucose attached to it (is glycated). Your health care provider will compare your results to normal ranges that were established after testing a large group of people (reference ranges). Reference ranges may vary among labs and hospitals. For this test, common reference ranges are:  Adult or child without diabetes: 4-5.6%.  Adult or child with diabetes and good blood glucose control: less than 7%. What do the results mean? If you have diabetes:  A result of less than 7% is considered normal, meaning that your blood glucose is well controlled.  A result higher than 7% means that your blood glucose is not well controlled,  and your treatment plan may need to be adjusted. If you do not have diabetes:  A result within the reference range is considered normal, meaning that you are not at high risk for diabetes.  A result of 5.7-6.4% means that you have a high risk of developing diabetes, and you have prediabetes. Prediabetes is the condition of having a blood glucose level that is higher than it should be but not high enough for you to be diagnosed with diabetes. Having prediabetes puts you at risk for developing type 2 diabetes. You may have more tests, including a repeat A1C test.  Results of 6.5% or higher on two separate A1C tests mean that you have diabetes. You may have more tests to confirm the diagnosis. Abnormally low A1C values may be caused by:  Pregnancy.  Severe blood loss.  Receiving donated blood (transfusions).  Low red blood cell count (anemia).  Long-term kidney failure.  Some unusual forms (variants) of hemoglobin. Talk with your health care provider about what your results mean. Questions to ask your health care provider Ask your health care provider, or the department that is doing the test:  When will my results be ready?  How will I get my results?  What are my treatment options?  What other tests do I need?  What are my next steps? Summary  The A1C test may be done to evaluate your risk for developing diabetes, to diagnose diabetes, and to monitor long-term control of blood sugar (glucose) in people who have diabetes and help make treatment decisions.  Hemoglobin is a type  of protein in the blood that carries oxygen. Glucose attaches to hemoglobin to form glycated hemoglobin. This test checks the amount of glycated hemoglobin in your blood, which is a good indicator of the average amount of glucose in your blood during the past 2-3 months.  Talk with your health care provider about what your results mean. This information is not intended to replace advice given to you by  your health care provider. Make sure you discuss any questions you have with your health care provider. Document Revised: 02/29/2020 Document Reviewed: 02/29/2020 Elsevier Patient Education  2021 [employer].

## 2020-10-18 ENCOUNTER — Ambulatory Visit (INDEPENDENT_AMBULATORY_CARE_PROVIDER_SITE_OTHER): Payer: PPO

## 2020-10-18 VITALS — Ht 66.0 in | Wt 246.0 lb

## 2020-10-18 DIAGNOSIS — Z0000 Encounter for general adult medical examination without abnormal findings: Secondary | ICD-10-CM | POA: Diagnosis not present

## 2020-10-18 LAB — CBC WITH DIFFERENTIAL/PLATELET
Basophils Absolute: 0.1 10*3/uL (ref 0.0–0.2)
Basos: 1 %
EOS (ABSOLUTE): 0.3 10*3/uL (ref 0.0–0.4)
Eos: 3 %
Hematocrit: 46.9 % (ref 37.5–51.0)
Hemoglobin: 15.4 g/dL (ref 13.0–17.7)
Immature Grans (Abs): 0 10*3/uL (ref 0.0–0.1)
Immature Granulocytes: 0 %
Lymphocytes Absolute: 3.2 10*3/uL — ABNORMAL HIGH (ref 0.7–3.1)
Lymphs: 33 %
MCH: 29.3 pg (ref 26.6–33.0)
MCHC: 32.8 g/dL (ref 31.5–35.7)
MCV: 89 fL (ref 79–97)
Monocytes Absolute: 1.1 10*3/uL — ABNORMAL HIGH (ref 0.1–0.9)
Monocytes: 11 %
Neutrophils Absolute: 5.1 10*3/uL (ref 1.4–7.0)
Neutrophils: 52 %
Platelets: 168 10*3/uL (ref 150–450)
RBC: 5.25 x10E6/uL (ref 4.14–5.80)
RDW: 13 % (ref 11.6–15.4)
WBC: 9.7 10*3/uL (ref 3.4–10.8)

## 2020-10-18 LAB — CMP14+EGFR
ALT: 27 IU/L (ref 0–44)
AST: 29 IU/L (ref 0–40)
Albumin/Globulin Ratio: 1.6 (ref 1.2–2.2)
Albumin: 4.1 g/dL (ref 3.7–4.7)
Alkaline Phosphatase: 62 IU/L (ref 44–121)
BUN/Creatinine Ratio: 19 (ref 10–24)
BUN: 21 mg/dL (ref 8–27)
Bilirubin Total: 0.8 mg/dL (ref 0.0–1.2)
CO2: 28 mmol/L (ref 20–29)
Calcium: 9.5 mg/dL (ref 8.6–10.2)
Chloride: 104 mmol/L (ref 96–106)
Creatinine, Ser: 1.1 mg/dL (ref 0.76–1.27)
Globulin, Total: 2.6 g/dL (ref 1.5–4.5)
Glucose: 106 mg/dL — ABNORMAL HIGH (ref 65–99)
Potassium: 4 mmol/L (ref 3.5–5.2)
Sodium: 147 mmol/L — ABNORMAL HIGH (ref 134–144)
Total Protein: 6.7 g/dL (ref 6.0–8.5)
eGFR: 69 mL/min/{1.73_m2} (ref >59)

## 2020-10-18 LAB — LIPID PANEL
Chol/HDL Ratio: 3.3 ratio (ref 0.0–5.0)
Cholesterol, Total: 113 mg/dL (ref 100–199)
HDL: 34 mg/dL — ABNORMAL LOW (ref >39)
LDL Chol Calc (NIH): 52 mg/dL (ref 0–99)
Triglycerides: 157 mg/dL — ABNORMAL HIGH (ref 0–149)
VLDL Cholesterol Cal: 27 mg/dL (ref 5–40)

## 2020-10-18 LAB — MICROALBUMIN / CREATININE URINE RATIO
Creatinine, Urine: 186.6 mg/dL
Microalb/Creat Ratio: 13 mg/g creat (ref 0–29)
Microalbumin, Urine: 24.7 ug/mL

## 2020-10-18 NOTE — Patient Instructions (Signed)
[name] , Thank you for taking time to come for your Medicare Wellness Visit. I appreciate your ongoing commitment to your health goals. Please review the following plan we discussed and let me know if I can assist you in the future.   Screening recommendations/referrals: Colonoscopy: Done 03/25/2019 - Repeat not required Recommended yearly ophthalmology/optometry visit for glaucoma screening and checkup Recommended yearly dental visit for hygiene and checkup  Vaccinations: Influenza vaccine: Done 03/19/2020 - Repeat annually Pneumococcal vaccine: Done 03/16/2009 & 12/29/2013 Tdap vaccine: Done 01/10/2013 - Repeat every 10 years Shingles vaccine: Done 09/01/17 & 11/12/17   Covid-19: Done 08/09/19, 08/30/19, 03/22/20, & 10/15/20  Advanced directives: Please bring a copy of your health care power of attorney and living will to the office to be added to your chart at your convenience.  Conditions/risks identified: Aim for 30 minutes of exercise or brisk walking each day, drink 6-8 glasses of water and eat lots of fruits and vegetables.  Next appointment: Follow up in one year for your annual wellness visit.   Preventive Care 48 Years and Older, Male  Preventive care refers to lifestyle choices and visits with your health care provider that can promote health and wellness. What does preventive care include?  A yearly physical exam. This is also called an annual well check.  Dental exams once or twice a year.  Routine eye exams. Ask your health care provider how often you should have your eyes checked.  Personal lifestyle choices, including:  Daily care of your teeth and gums.  Regular physical activity.  Eating a healthy diet.  Avoiding tobacco and drug use.  Limiting alcohol use.  Practicing safe sex.  Taking low doses of aspirin every day.  Taking vitamin and mineral supplements as recommended by your health care provider. What happens during an annual well check? The services  and screenings done by your health care provider during your annual well check will depend on your age, overall health, lifestyle risk factors, and family history of disease. Counseling  Your health care provider may ask you questions about your:  Alcohol use.  Tobacco use.  Drug use.  Emotional well-being.  Home and relationship well-being.  Sexual activity.  Eating habits.  History of falls.  Memory and ability to understand (cognition).  Work and work Statistician. Screening  You may have the following tests or measurements:  Height, weight, and BMI.  Blood pressure.  Lipid and cholesterol levels. These may be checked every 5 years, or more frequently if you are over 20 years old.  Skin check.  Lung cancer screening. You may have this screening every year starting at age 22 if you have a 30-pack-year history of smoking and currently smoke or have quit within the past 15 years.  Fecal occult blood test (FOBT) of the stool. You may have this test every year starting at age 3.  Flexible sigmoidoscopy or colonoscopy. You may have a sigmoidoscopy every 5 years or a colonoscopy every 10 years starting at age 40.  Prostate cancer screening. Recommendations will vary depending on your family history and other risks.  Hepatitis C blood test.  Hepatitis B blood test.  Sexually transmitted disease (STD) testing.  Diabetes screening. This is done by checking your blood sugar (glucose) after you have not eaten for a while (fasting). You may have this done every 1-3 years.  Abdominal aortic aneurysm (AAA) screening. You may need this if you are a current or former smoker.  Osteoporosis. You may be screened  starting at age 82 if you are at high risk. Talk with your health care provider about your test results, treatment options, and if necessary, the need for more tests. Vaccines  Your health care provider may recommend certain vaccines, such as:  Influenza vaccine. This  is recommended every year.  Tetanus, diphtheria, and acellular pertussis (Tdap, Td) vaccine. You may need a Td booster every 10 years.  Zoster vaccine. You may need this after age 89.  Pneumococcal 13-valent conjugate (PCV13) vaccine. One dose is recommended after age 34.  Pneumococcal polysaccharide (PPSV23) vaccine. One dose is recommended after age 85. Talk to your health care provider about which screenings and vaccines you need and how often you need them. This information is not intended to replace advice given to you by your health care provider. Make sure you discuss any questions you have with your health care provider. Document Released: 06/29/2015 Document Revised: 02/20/2016 Document Reviewed: 04/03/2015 Elsevier Interactive Patient Education  2017 [place] Prevention in the Home Falls can cause injuries. They can happen to people of all ages. There are many things you can do to make your home safe and to help prevent falls. What can I do on the outside of my home?  Regularly fix the edges of walkways and driveways and fix any cracks.  Remove anything that might make you trip as you walk through a door, such as a raised step or threshold.  Trim any bushes or trees on the path to your home.  Use bright outdoor lighting.  Clear any walking paths of anything that might make someone trip, such as rocks or tools.  Regularly check to see if handrails are loose or broken. Make sure that both sides of any steps have handrails.  Any raised decks and porches should have guardrails on the edges.  Have any leaves, snow, or ice cleared regularly.  Use sand or salt on walking paths during winter.  Clean up any spills in your garage right away. This includes oil or grease spills. What can I do in the bathroom?  Use night lights.  Install grab bars by the toilet and in the tub and shower. Do not use towel bars as grab bars.  Use non-skid mats or decals in the tub or  shower.  If you need to sit down in the shower, use a plastic, non-slip stool.  Keep the floor dry. Clean up any water that spills on the floor as soon as it happens.  Remove soap buildup in the tub or shower regularly.  Attach bath mats securely with double-sided non-slip rug tape.  Do not have throw rugs and other things on the floor that can make you trip. What can I do in the bedroom?  Use night lights.  Make sure that you have a light by your bed that is easy to reach.  Do not use any sheets or blankets that are too big for your bed. They should not hang down onto the floor.  Have a firm chair that has side arms. You can use this for support while you get dressed.  Do not have throw rugs and other things on the floor that can make you trip. What can I do in the kitchen?  Clean up any spills right away.  Avoid walking on wet floors.  Keep items that you use a lot in easy-to-reach places.  If you need to reach something above you, use a strong step stool that has a grab  bar.  Keep electrical cords out of the way.  Do not use floor polish or wax that makes floors slippery. If you must use wax, use non-skid floor wax.  Do not have throw rugs and other things on the floor that can make you trip. What can I do with my stairs?  Do not leave any items on the stairs.  Make sure that there are handrails on both sides of the stairs and use them. Fix handrails that are broken or loose. Make sure that handrails are as long as the stairways.  Check any carpeting to make sure that it is firmly attached to the stairs. Fix any carpet that is loose or worn.  Avoid having throw rugs at the top or bottom of the stairs. If you do have throw rugs, attach them to the floor with carpet tape.  Make sure that you have a light switch at the top of the stairs and the bottom of the stairs. If you do not have them, ask someone to add them for you. What else can I do to help prevent  falls?  Wear shoes that:  Do not have high heels.  Have rubber bottoms.  Are comfortable and fit you well.  Are closed at the toe. Do not wear sandals.  If you use a stepladder:  Make sure that it is fully opened. Do not climb a closed stepladder.  Make sure that both sides of the stepladder are locked into place.  Ask someone to hold it for you, if possible.  Clearly mark and make sure that you can see:  Any grab bars or handrails.  First and last steps.  Where the edge of each step is.  Use tools that help you move around (mobility aids) if they are needed. These include:  Canes.  Walkers.  Scooters.  Crutches.  Turn on the lights when you go into a dark area. Replace any light bulbs as soon as they burn out.  Set up your furniture so you have a clear path. Avoid moving your furniture around.  If any of your floors are uneven, fix them.  If there are any pets around you, be aware of where they are.  Review your medicines with your doctor. Some medicines can make you feel dizzy. This can increase your chance of falling. Ask your doctor what other things that you can do to help prevent falls. This information is not intended to replace advice given to you by your health care provider. Make sure you discuss any questions you have with your health care provider. Document Released: 03/29/2009 Document Revised: 11/08/2015 Document Reviewed: 07/07/2014 Elsevier Interactive Patient Education  2017 [employer].

## 2020-10-18 NOTE — Progress Notes (Signed)
Subjective:   Lonnie Hurley is a 80 y.o. male who presents for Medicare Annual/Subsequent preventive examination.  Virtual Visit via Telephone Note  I connected with  Lonnie Hurley on 10/18/20 at  2:00 PM EDT by telephone and verified that I am speaking with the correct person using two identifiers.  Location: Patient: Home Provider: [facility] Persons participating in the virtual visit: patient/Nurse Health Advisor   I discussed the limitations, risks, security and privacy concerns of performing an evaluation and management service by telephone and the availability of in person appointments. The patient expressed understanding and agreed to proceed.  Interactive audio and video telecommunications were attempted between this nurse and patient, however failed, due to patient having technical difficulties OR patient did not have access to video capability.  We continued and completed visit with audio only.  Some vital signs may be absent or patient reported.   Tanya Crothers E [place], LPN   Review of Systems     Cardiac Risk Factors include: advanced age (>32men, >38 women);diabetes mellitus;family history of premature cardiovascular disease;male gender;sedentary lifestyle;dyslipidemia     Objective:    Today's Vitals   10/18/20 1256 10/18/20 1257  Weight: 246 lb (111.6 kg)   Height: 5\' 6"  (1.676 m)   PainSc:  3    Body mass index is 39.71 kg/m.  Advanced Directives 10/18/2020 10/17/2019 10/12/2019 10/15/2018 04/30/2018 02/19/2017 08/26/2016  Does Patient Have a Medical Advance Directive? Yes Yes Yes No No Yes Yes  Type of Paramedic of [place];Living will Living will;Healthcare Power of [place];Living will  Does patient want to make changes to medical advance directive? - No - Patient declined - - - - No - Patient declined  Copy of [place] in Chart? No - copy requested No - copy requested - - - No - copy requested  No - copy requested  Would patient like information on creating a medical advance directive? - - - No - Patient declined No - Patient declined - No - Patient declined    Current Medications (verified) Outpatient Encounter Medications as of 10/18/2020  Medication Sig  . acetaminophen (TYLENOL) 500 MG tablet Take 500 mg by mouth every 6 (six) hours as needed.  . Calcium Carb-Cholecalciferol (CALCIUM 600 + D PO) Take by mouth 2 (two) times daily.   . Cholecalciferol (VITAMIN D3) 2000 UNITS TABS Take 1 tablet by mouth daily.  . diclofenac Sodium (VOLTAREN) 1 % GEL Apply 2 g topically 4 (four) times daily.  Marland Kitchen ezetimibe (ZETIA) 10 MG tablet Take 1 tablet (10 mg total) by mouth daily.  Marland Kitchen gabapentin (NEURONTIN) 100 MG capsule TAKE 1 CAPSULE BY MOUTH THREE TIMES A DAY  . Glucosamine-Chondroit-Vit C-Mn (GLUCOSAMINE 1500 COMPLEX PO) Take by mouth 2 (two) times daily.   Marland Kitchen glucose blood (ONE TOUCH ULTRA TEST) test strip Use as instructed  . hydrochlorothiazide (HYDRODIURIL) 12.5 MG tablet Take 1 tablet (12.5 mg total) by mouth daily.  . meloxicam (MOBIC) 7.5 MG tablet Take 1 tablet (7.5 mg total) by mouth daily.  . Multiple Vitamins-Minerals (MULTIVITAMIN WITH MINERALS) tablet Take 1 tablet by mouth daily.  Marland Kitchen oxybutynin (DITROPAN-XL) 10 MG 24 hr tablet Take 10 mg by mouth daily.  . polyethylene glycol powder (MIRALAX) 17 GM/SCOOP powder Take 17 g by mouth as needed for moderate constipation.  . ramipril (ALTACE) 10 MG capsule Take 1 capsule (10 mg total) by mouth daily.  . simvastatin (ZOCOR) 40 MG tablet  Take 1 tablet (40 mg total) by mouth at bedtime.  . tamsulosin (FLOMAX) 0.4 MG CAPS capsule Take 1 capsule (0.4 mg total) by mouth at bedtime.  . Vitamin D, Ergocalciferol, (DRISDOL) 1.25 MG (50000 UNIT) CAPS capsule Take 1 capsule (50,000 Units total) by mouth every 7 (seven) days.   Facility-Administered Encounter Medications as of 10/18/2020  Medication  . cyanocobalamin ((VITAMIN B-12)) injection  1,000 mcg    Allergies (verified) Penicillins, Aspirin, Crestor [rosuvastatin calcium], Gabapentin, Hydrocodone, and Lyrica [pregabalin]   History: Past Medical History:  Diagnosis Date  . BPH (benign prostatic hypertrophy)    Dr. Earlene Plater / Dr. Etta Grandchild  - Urologist   . Cancer [facility])    lip-mole surgery  . Cataract    bilateral  . Colon polyps   . Constipation    occasional - miralax prn  . DDD (degenerative disc disease)   . Hearing loss    left ear, no hearing aids  . Hyperlipidemia   . Hypertension   . Leukoplakia   . Microscopic hematuria    negative work up with Urology in the past.   . OAB (overactive bladder)    with past percutaneous tibial nerve stimulation therapy  . Thrombocytopenia (HCC)   . Tinnitus   . Tobacco abuse   . Vitamin B 12 deficiency   . Vitamin D deficiency    Past Surgical History:  Procedure Laterality Date  . CATARACT EXTRACTION  2007   right Hurley  . CATARACT EXTRACTION Left 03/21/15  . COLONOSCOPY  02/28/2014  . HERNIA REPAIR  1980  . KNEE SURGERY  02-28-2008   replaced inside right knee  . Left Knee Repair  11-10-2006  . NOSE SURGERY    . POLYPECTOMY     colon polyps  . SKIN BIOPSY  10-08-2006   left ear   Family History  Problem Relation Age of Onset  . Heart failure Father   . Glaucoma Mother   . Osteoporosis Brother   . Heart disease Brother 49       stent  . Hypertension Brother   . Hyperlipidemia Brother   . Diabetes Brother   . Colon cancer Neg Hx   . Stomach cancer Neg Hx   . Rectal cancer Neg Hx    Social History   Socioeconomic History  . Marital status: Single    Spouse name: Not on file  . Number of children: 0  . Years of education: 10  . Highest education level: 10th grade  Occupational History  . Occupation: Retired    Comment: retired Chartered loss adjuster  Tobacco Use  . Smoking status: Former Smoker    Packs/day: 1.00    Years: 50.00    Pack years: 50.00    Types: Cigarettes    Quit date:  08/14/2009    Years since quitting: 11.1  . Smokeless tobacco: Never Used  Vaping Use  . Vaping Use: Never used  Substance and Sexual Activity  . Alcohol use: No  . Drug use: No  . Sexual activity: Not Currently  Other Topics Concern  . Not on file  Social History Narrative   Retired Chartered loss adjuster   Lives at home with Brother, Environmental manager   Social Determinants of Health   Financial Resource Strain: Low Risk   . Difficulty of Paying Living Expenses: Not hard at all  Food Insecurity: No Food Insecurity  . Worried About Programme researcher, broadcasting/film/video in the Last Year: Never true  . Ran Out of  Food in the Last Year: Never true  Transportation Needs: No Transportation Needs  . Lack of Transportation (Medical): No  . Lack of Transportation (Non-Medical): No  Physical Activity: Insufficiently Active  . Days of Exercise per Week: 4 days  . Minutes of Exercise per Session: 20 min  Stress: No Stress Concern Present  . Feeling of Stress : Not at all  Social Connections: Moderately Isolated  . Frequency of Communication with Friends and Family: More than three times a week  . Frequency of Social Gatherings with Friends and Family: More than three times a week  . Attends Religious Services: More than 4 times per year  . Active Member of Clubs or Organizations: No  . Attends Archivist Meetings: Never  . Marital Status: Never married    Tobacco Counseling Counseling given: Not Answered   Clinical Intake:  Pre-visit preparation completed: Yes  Pain : 0-10 Pain Score: 3  Pain Location: Flank Pain Orientation: Left Pain Descriptors / Indicators: Aching Pain Onset: More than a month ago Pain Frequency: Intermittent     BMI - recorded: 39.71 Nutritional Status: BMI > 30  Obese Nutritional Risks: None Diabetes: Yes CBG done?: No Did pt. bring in CBG monitor from home?: No  How often do you need to have someone help you when you read instructions, pamphlets, or other  written materials from your doctor or pharmacy?: 1 - Never  Nutrition Risk Assessment:  Has the patient had any N/V/D within the last 2 months?  No  Does the patient have any non-healing wounds?  No  Has the patient had any unintentional weight loss or weight gain?  No   Diabetes:  Is the patient diabetic?  Yes  If diabetic, was a CBG obtained today?  No  Did the patient bring in their glucometer from home?  No  How often do you monitor your CBG's? Once or twice per week.   Financial Strains and Diabetes Management:  Are you having any financial strains with the device, your supplies or your medication? No .  Does the patient want to be seen by Chronic Care Management for management of their diabetes?  No  Would the patient like to be referred to a Nutritionist or for Diabetic Management?  No   Diabetic Exams:  Diabetic Hurley Exam: Completed 05/22/2020.  Diabetic Foot Exam: Completed 04/19/2020. Pt has been advised about the importance in completing this exam. Pt is scheduled for diabetic foot exam on 04/2021.    Interpreter Needed?: No  Information entered by :: Carolos Fecher, LPN   Activities of Daily Living In your present state of health, do you have any difficulty performing the following activities: 10/18/2020  Hearing? Y  Vision? N  Difficulty concentrating or making decisions? N  Walking or climbing stairs? Y  Dressing or bathing? N  Doing errands, shopping? N  Preparing Food and eating ? N  Using the Toilet? N  In the past six months, have you accidently leaked urine? N  Do you have problems with loss of bowel control? N  Managing your Medications? N  Managing your Finances? N  Housekeeping or managing your Housekeeping? N  Some recent data might be hidden    Patient Care Team: Sharion Balloon, FNP as PCP - General (Family Medicine) Nobie Putnam, MD (Hematology and Oncology) Irine Seal, MD as Attending Physician (Urology) [place]ord, Layne Benton, MD as Consulting  Physician (Dermatology) Warden Fillers, MD as Consulting Physician (Ophthalmology) Erline Levine, MD as  Consulting Physician (Neurosurgery) Ilean China, RN as Case Manager Forrest, [name], LCSW as Social Worker (Licensed Clinical Social Worker)  Indicate any recent [employer] you may have received from other than [facility] providers in the past year (date may be approximate).     Assessment:   This is a routine wellness examination for [place].  Hearing/Vision screen  Hearing Screening   125Hz  250Hz  500Hz  1000Hz  2000Hz  3000Hz  4000Hz  6000Hz  8000Hz   Right ear:           Left ear:           Comments: Hearing check with Dr Benjamine Mola - Has appt with [facility] in [place] next month  Vision Screening Comments: Annual visits with Dr Katy Fitch - up to date with Hurley exams - wears eyeglasses  Dietary issues and exercise activities discussed: Current Exercise Habits: Home exercise routine, Type of exercise: walking, Time (Minutes): 20, Frequency (Times/Week): 4, Weekly Exercise (Minutes/Week): 80, Intensity: Mild, Exercise limited by: orthopedic condition(s)  Goals Addressed            This Visit's Progress   . Complete ADLs daily as able   On track    Timeframe:  Short-Term Goal Priority:  Medium Progress:  On Track Start Date:              09/26/20               Expected End Date:            12/26/20           Follow Up Date 10/31/20   Patient Coping Skills/Strengths: Completes ADLs daily as able Takes medications as prescribed Attends scheduled medical appointments  Patient Deficits  Some mobility challenges Reduced social contact with others  Patient Goals:   Patient will talk with LCSW in next 30 days about mobility of client and about client completion of daily ADLs Patient will socialize with others or talk with others 2-3 times weekly in next 30 days Patient will attend scheduled medical appointments in next 30 days -  Follow Up Plan: LCSW to call  client or brother of client on 10/31/20     . Exercise 150 min/wk Moderate Activity   Not on track    10/17/2019 AWV Goal: Exercise for General Health   Patient will verbalize understanding of the benefits of increased physical activity:  Exercising regularly is important. It will improve your overall fitness, flexibility, and endurance.  Regular exercise also will improve your overall health. It can help you control your weight, reduce stress, and improve your bone density.  Over the next year, patient will increase physical activity as tolerated with a goal of at least 150 minutes of moderate physical activity per week.   You can tell that you are exercising at a moderate intensity if your heart starts beating faster and you start breathing faster but can still hold a conversation.  Moderate-intensity exercise ideas include:  Walking 1 mile (1.6 km) in about 15 minutes  Biking  Hiking  Golfing  Dancing  Water aerobics  Patient will verbalize understanding of everyday activities that increase physical activity by providing examples like the following: ? Yard work, such as: ? Pushing a Conservation officer, nature ? Raking and bagging leaves ? Washing your car ? Pushing a stroller ? Shoveling snow ? Gardening ? Washing windows or floors  Patient will be able to explain general safety guidelines for exercising:   Before you start a new exercise program, talk with  your health care provider.  Do not exercise so much that you hurt yourself, feel dizzy, or get very short of breath.  Wear comfortable clothes and wear shoes with good support.  Drink plenty of water while you exercise to prevent dehydration or heat stroke.  Work out until your breathing and your heartbeat get faster.     . Have 3 meals a day   On track    10/17/2019 AWV Goal: Improved Nutrition/Diet  . Patient will verbalize understanding that diet plays an important role in overall health and that a poor diet is a risk  factor for many chronic medical conditions.  . Over the next year, patient will improve self management of their diet by incorporating improved meal pattern, decreased fat intake, more consistent meal timing, fewer sweetened foods & beverages, increased physical activity, improved protein intake, adequate fluid intake (at least 6 cups of fluid per day), watch portion sizes/amount of food eaten at one time, and eat 6 small meals per day. . Patient will utilize available community resources to help with food acquisition if needed (ex: food pantries, [address], etc) . Patient will work with nutrition specialist if a referral was made       Depression Screen PHQ 2/9 Scores 10/18/2020 11/10/2019 10/18/2019 10/17/2019 10/06/2019 10/06/2019 09/12/2019  PHQ - 2 Score 0 0 0 0 0 0 0  PHQ- 9 Score - - - - - - -    Fall Risk Fall Risk  10/18/2020 11/10/2019 10/18/2019 10/17/2019 10/06/2019  Falls in the past year? 0 0 0 0 0  Number falls in past yr: 0 - - 0 -  Injury with Fall? 0 - - 0 -  Risk for fall due to : Impaired balance/gait;Medication side effect;Orthopedic patient - - History of fall(s);Impaired balance/gait;Impaired mobility -  Follow up Falls prevention discussed - - Falls evaluation completed -    FALL RISK PREVENTION PERTAINING TO THE HOME:  Any stairs in or around the home? Yes  If so, are there any without handrails? No  Home free of loose throw rugs in walkways, pet beds, electrical cords, etc? Yes  Adequate lighting in your home to reduce risk of falls? Yes   ASSISTIVE DEVICES UTILIZED TO PREVENT FALLS:  Life alert? No  Use of a cane, walker or w/c? Yes  Grab bars in the bathroom? Yes  Shower chair or bench in shower? Yes  Elevated toilet seat or a handicapped toilet? Yes   TIMED UP AND GO:  Was the test performed? No . Telephonic visit.  Cognitive Function: Normal cognitive status assessed by direct observation by this Nurse Health Advisor. No abnormalities found.   MMSE - Mini Mental  State Exam 08/31/2017 08/25/2016 10/25/2015  Orientation to time 5 5 5   Orientation to Place 5 5 5   Registration 3 3 3   Attention/ Calculation 5 5 4   Recall 3 3 3   Language- name 2 objects 2 2 2   Language- repeat 1 1 1   Language- follow 3 step command 3 3 3   Language- read & follow direction 1 1 1   Write a sentence 1 1 1   Copy design 1 1 0  Total score 30 30 28      6CIT Screen 10/17/2019 10/15/2018  What Year? 0 points 0 points  What month? 0 points 0 points  What time? 0 points 0 points  Count back from 20 0 points 0 points  Months in reverse 0 points 0 points  Repeat phrase 0 points 0  points  Total Score 0 0    Immunizations Immunization History  Administered Date(s) Administered  . Fluad Quad(high Dose 65+) 03/07/2019, 03/19/2020  . Influenza, High Dose Seasonal PF 03/13/2016, 03/17/2017, 03/15/2018  . Influenza,inj,Quad PF,6+ Mos 03/14/2014, 03/16/2015  . PFIZER(Purple Top)SARS-COV-2 Vaccination 08/09/2019, 08/30/2019, 03/22/2020, 10/15/2020  . Pneumococcal Conjugate-13 12/29/2013  . Pneumococcal Polysaccharide-23 03/16/2009  . Rabies, IM 01/10/2013, 01/17/2013, 01/24/2013, 01/31/2013  . Tdap 01/10/2013  . Zoster 03/17/2011  . Zoster Recombinat (Shingrix) 09/01/2017, 11/12/2017    TDAP status: Up to date  Flu Vaccine status: Up to date  Pneumococcal vaccine status: Up to date  Covid-19 vaccine status: Completed vaccines  Qualifies for Shingles Vaccine? Yes   Zostavax completed Yes   Shingrix Completed?: Yes  Screening Tests Health Maintenance  Topic Date Due  . INFLUENZA VACCINE  01/14/2021  . FOOT EXAM  04/19/2021  . HEMOGLOBIN A1C  04/19/2021  . OPHTHALMOLOGY EXAM  05/22/2021  . TETANUS/TDAP  01/11/2023  . COLONOSCOPY (Pts 45-22yrs Insurance coverage will need to be confirmed)  03/24/2024  . COVID-19 Vaccine  Completed  . Hepatitis C Screening  Completed  . PNA vac Low Risk Adult  Completed  . HPV VACCINES  Aged Out    Health Maintenance  There are  no preventive care reminders to display for this patient.  Colorectal cancer screening: Type of screening: Colonoscopy. Completed 03/25/2019. Repeat every 10 years  Lung Cancer Screening: (Low Dose CT Chest recommended if Age 35-80 years, 30 pack-year currently smoking OR have quit w/in 15years.) does not qualify.   Additional Screening:  Hepatitis C Screening: does not qualify  Vision Screening: Recommended annual ophthalmology exams for early detection of glaucoma and other disorders of the Hurley. Is the patient up to date with their annual Hurley exam?  Yes  Who is the provider or what is the name of the office in which the patient attends annual Hurley exams? [facility] If pt is not established with a provider, would they like to be referred to a provider to establish care? No .   Dental Screening: Recommended annual dental exams for proper oral hygiene  Community Resource Referral / Chronic Care Management: CRR required this visit?  No   CCM required this visit?  No      Plan:     I have personally reviewed and noted the following in the patient's chart:   . Medical and social history . Use of alcohol, tobacco or illicit drugs  . Current medications and supplements including opioid prescriptions. Patient is not currently taking opioid prescriptions. . Functional ability and status . Nutritional status . Physical activity . Advanced directives . List of other physicians . Hospitalizations, surgeries, and ER visits in previous 12 months . Vitals . Screenings to include cognitive, depression, and falls . Referrals and appointments  In addition, I have reviewed and discussed with patient certain preventive protocols, quality metrics, and best practice recommendations. A written personalized care plan for preventive services as well as general preventive health recommendations were provided to patient.     Sandrea Hammond, LPN   01/20/5783   Nurse Notes: None

## 2020-10-31 ENCOUNTER — Telehealth: Payer: PPO

## 2020-11-01 ENCOUNTER — Other Ambulatory Visit: Payer: Self-pay

## 2020-11-01 ENCOUNTER — Ambulatory Visit (INDEPENDENT_AMBULATORY_CARE_PROVIDER_SITE_OTHER): Payer: PPO

## 2020-11-01 DIAGNOSIS — E538 Deficiency of other specified B group vitamins: Secondary | ICD-10-CM | POA: Diagnosis not present

## 2020-11-01 NOTE — Progress Notes (Signed)
Cyanocobalamin injection given to left deltoid.  Patient tolerated well. 

## 2020-11-08 ENCOUNTER — Other Ambulatory Visit: Payer: Self-pay

## 2020-11-08 DIAGNOSIS — M79605 Pain in left leg: Secondary | ICD-10-CM

## 2020-11-08 MED ORDER — GABAPENTIN 100 MG PO CAPS: ORAL_CAPSULE | ORAL | 0 refills | Status: DC

## 2020-11-13 ENCOUNTER — Telehealth: Payer: PPO

## 2020-11-14 ENCOUNTER — Ambulatory Visit (INDEPENDENT_AMBULATORY_CARE_PROVIDER_SITE_OTHER): Payer: PPO | Admitting: *Deleted

## 2020-11-14 DIAGNOSIS — I10 Essential (primary) hypertension: Secondary | ICD-10-CM

## 2020-11-14 DIAGNOSIS — E1169 Type 2 diabetes mellitus with other specified complication: Secondary | ICD-10-CM

## 2020-11-14 NOTE — Patient Instructions (Signed)
Visit Information  PATIENT GOALS: Goals Addressed            This Visit's Progress   . Manage Hypertension   On track    Timeframe:  Long-Range Goal Priority:  Medium Start Date:                             Expected End Date:                       Follow-up: 12/25/20  . check blood pressure at least 3 times a week . write blood pressure results in a log or diary  . Take blood pressure log to PCP appointments . Call PCP with any readings outside of recommended range . Take medications as directed . Read food labels for the amount of sodium in them and don't add extra salt to food    . Prevent Diabetes Complications   On track    Timeframe:  Long-Range Goal Priority:  High Start Date:                             Expected End Date:                       Follow Up Date 12/25/20   . Check feet daily for sores, cracks, or calluses . Let PCP know if you find anything wrong with your feel . Limit the amount of sugar and simple carbohydrates in your diet . Have a yearly eye exam to check for changes that can happen from diabetes and high blood pressure . Keep regular follow-up appointments with your PCP        The patient verbalized understanding of instructions, educational materials, and care plan provided today and declined offer to receive copy of patient instructions, educational materials, and care plan.   Follow Up Plan:  . Telephone follow up appointment with care management team member scheduled for: [facility] 12/25/20 . The patient has been provided with contact information for the care management team and has been advised to call with any health related questions or concerns.   Chong Sicilian, BSN, RN-BC Embedded Chronic Care Manager [facility] Direct Dial: [phone]

## 2020-11-14 NOTE — Chronic Care Management (AMB) (Signed)
Chronic Care Management   CCM RN Visit Note  11/14/2020 Name: Lonnie Hurley MRN: [id number] DOB: [birth year]  Subjective: Lonnie Hurley is a 80 y.o. year old male who is a primary care patient of Sharion Balloon, FNP. The care management team was consulted for assistance with disease management and care coordination needs.    Engaged with patient by telephone for follow up visit in response to provider referral for case management and/or care coordination services.   Consent to Services:  The patient was given information about Chronic Care Management services, agreed to services, and gave verbal consent prior to initiation of services.  Please see initial visit note for detailed documentation.   Patient agreed to services and verbal consent obtained.   Assessment: Review of patient past medical history, allergies, medications, health status, including review of consultants reports, laboratory and other test data, was performed as part of comprehensive evaluation and provision of chronic care management services.   SDOH (Social Determinants of Health) assessments and interventions performed:    CCM Care Plan  Allergies  Allergen Reactions  . Penicillins Other (See Comments)    unknown  . Aspirin Other (See Comments)    Causes Blood in urine  . Crestor [Rosuvastatin Calcium] Other (See Comments)    Myalgia, "joint problem"  . Gabapentin Other (See Comments)    Blurry vision Pt able to take in small doses   . Hydrocodone Other (See Comments)    Constipation    . Lyrica [Pregabalin]     Blurry vision     Outpatient Encounter Medications as of 11/14/2020  Medication Sig  . acetaminophen (TYLENOL) 500 MG tablet Take 500 mg by mouth every 6 (six) hours as needed.  . Calcium Carb-Cholecalciferol (CALCIUM 600 + D PO) Take by mouth 2 (two) times daily.   . Cholecalciferol (VITAMIN D3) 2000 UNITS TABS Take 1 tablet by mouth daily.  . diclofenac Sodium (VOLTAREN) 1 % GEL Apply 2 g  topically 4 (four) times daily.  Marland Kitchen ezetimibe (ZETIA) 10 MG tablet Take 1 tablet (10 mg total) by mouth daily.  Marland Kitchen gabapentin (NEURONTIN) 100 MG capsule TAKE 1 CAPSULE BY MOUTH THREE TIMES A DAY  . Glucosamine-Chondroit-Vit C-Mn (GLUCOSAMINE 1500 COMPLEX PO) Take by mouth 2 (two) times daily.   Marland Kitchen glucose blood (ONE TOUCH ULTRA TEST) test strip Use as instructed  . hydrochlorothiazide (HYDRODIURIL) 12.5 MG tablet Take 1 tablet (12.5 mg total) by mouth daily.  . meloxicam (MOBIC) 7.5 MG tablet Take 1 tablet (7.5 mg total) by mouth daily.  . Multiple Vitamins-Minerals (MULTIVITAMIN WITH MINERALS) tablet Take 1 tablet by mouth daily.  Marland Kitchen oxybutynin (DITROPAN-XL) 10 MG 24 hr tablet Take 10 mg by mouth daily.  . polyethylene glycol powder (MIRALAX) 17 GM/SCOOP powder Take 17 g by mouth as needed for moderate constipation.  . ramipril (ALTACE) 10 MG capsule Take 1 capsule (10 mg total) by mouth daily.  . simvastatin (ZOCOR) 40 MG tablet Take 1 tablet (40 mg total) by mouth at bedtime.  . tamsulosin (FLOMAX) 0.4 MG CAPS capsule Take 1 capsule (0.4 mg total) by mouth at bedtime.  . Vitamin D, Ergocalciferol, (DRISDOL) 1.25 MG (50000 UNIT) CAPS capsule Take 1 capsule (50,000 Units total) by mouth every 7 (seven) days.   Facility-Administered Encounter Medications as of 11/14/2020  Medication  . cyanocobalamin ((VITAMIN B-12)) injection 1,000 mcg    Patient Active Problem List   Diagnosis Date Noted  . Haglund's deformity of right heel 06/27/2019  . RMSF [facility]  [place] spotted fever) 07/02/2018  . Chronic pain of right knee 04/14/2018  . Heel pain, chronic, right 03/19/2018  . Foot pain, bilateral 03/19/2018  . Kidney stone 03/19/2018  . Arthritis of back 08/25/2016  . AAA (abdominal aortic aneurysm) without rupture ([place]) 07/13/2016  . Lumbar spondylosis with myelopathy 07/13/2016  . Diabetes mellitus ([place]) 09/18/2015  . Metabolic syndrome 78/46/9629  . Vitamin B 12 deficiency 12/14/2014  .  Hereditary and idiopathic peripheral neuropathy 10/17/2014  . Vitamin D deficiency 06/29/2014  . BPH (benign prostatic hyperplasia) 06/29/2014  . Hypertension 11/17/2012  . Hyperlipidemia 11/17/2012  . DDD (degenerative disc disease), lumbar 11/17/2012  . Thrombocytopenia ([place])     Conditions to be addressed/monitored:HTN and DMII  Care Plan : RNCM: Diabetes Type 2 (Adult)  Updates made by Ilean China, RN since 11/14/2020 12:00 AM    Problem: Disease Progression (Diabetes, Type 2)     Long-Range Goal: Prevent Diabetes Complications   This Visit's Progress: On track  Recent Progress: On track  Priority: Medium  Note:   Objective: Lab Results  Component Value Date   HGBA1C 7.2 (H) 10/17/2020   HGBA1C 6.5 04/19/2020   HGBA1C 7.5 (H) 10/18/2019   Lab Results  Component Value Date   LDLCALC 52 10/17/2020   CREATININE 1.10 10/17/2020   Current Barriers:  . Chronic Disease Management support and education needs related to diabetes . Lacks caregiver support.   Nurse Case Manager Clinical Goal(s):  Marland Kitchen Patient will work with PCP regarding medical management of diabetes . Patient will work with RN Care Manager to address needs related to diabetes management . Patient will demonstrate ongoing self-management of diabetes as evidenced by maintaining an A1C below 7  Interventions:  . 1:1 collaboration with Sharion Balloon, FNP regarding development and update of comprehensive plan of care as evidenced by provider attestation and co-signature . Inter-disciplinary care team collaboration (see longitudinal plan of care) . Evaluation of current treatment plan related to diabetes and patient's adherence to plan as established by provider. . Chart reviewed including relevant office notes and lab results . Discussed most recent A1C had increased from 6.5 to 7.2 . Reviewed medications and discussed importance of compliance . Does not routinely check blood sugar at home . Discussed  diet o Limits sugar and other simple carbohydrates . Reinforced need to check feet daily for sores or calluses and to call PCP with any abnormal findings . Reinforced need to have yearly eye exams to check for diabetic retinopathy . Reviewed upcoming appointments . Provided with RN Care Manager contact number and encouraged to reach out as needed  Patient Goals/Self-Care Activities Over the next 60 days, patient will: . Check feet daily for sores, cracks, or calluses . Let PCP know if you find anything wrong with your feel . Limit the amount of sugar and simple carbohydrates in your diet . Have a yearly eye exam to check for changes that can happen from diabetes and high blood pressure . Keep regular follow-up appointments with your PCP    Care Plan : RNCM: Hypertension (Adult)  Updates made by Ilean China, RN since 11/14/2020 12:00 AM    Problem: Hypertension (Hypertension)     Long-Range Goal: Hypertension Monitored   This Visit's Progress: On track  Recent Progress: On track  Priority: Medium  Note:   Objective: BP Readings from Last 3 Encounters:  10/17/20 112/70  07/12/20 113/61  06/04/20 114/68   Current Barriers:  . Chronic Disease Management support and  education needs related to hypertension . Lacks family/social support  Nurse Case Manager Clinical Goal(s):  Marland Kitchen Patient will meet with RN Care Manager to address self-management of hypertension management . Patient will work with PCP regarding medical management of hypertension  Interventions:  . 1:1 collaboration with Sharion Balloon, FNP regarding development and update of comprehensive plan of care as evidenced by provider attestation and co-signature . Inter-disciplinary care team collaboration (see longitudinal plan of care) . Evaluation of current treatment plan related to hypertension and patient's adherence to plan as established by provider. . Chart reviewed including relevant office notes and lab  results . Reviewed most recent in-office blood pressure readings . Reviewed and discussed medication and importance of compliance . Reinforced to continue check and recording blood pressure at least 3 times a week and to bring log to PCP appointments . Reminded to eat a low sodium diet and to not add extra salt to food . Advised patient to call PCP with any readings outside of recommended range . Provided with RN Care Manager contact number and encouraged to reach out as needed  Patient Goals/Self-Care Activities Over the next 60 days, patient will: . check blood pressure at least 3 times a week . write blood pressure results in a log or diary  . Take blood pressure log to PCP appointments . Call PCP with any readings outside of recommended range . Take medications as directed . Read food labels for the amount of sodium in them and don't add extra salt to food    Follow Up Plan:  . Telephone follow up appointment with care management team member scheduled for: [facility] 12/25/20 . The patient has been provided with contact information for the care management team and has been advised to call with any health related questions or concerns.   Chong Sicilian, BSN, RN-BC Embedded Chronic Care Manager [facility] [place] [facility] Direct Dial: [phone]

## 2020-11-20 ENCOUNTER — Other Ambulatory Visit: Payer: Self-pay | Admitting: *Deleted

## 2020-11-20 DIAGNOSIS — E785 Hyperlipidemia, unspecified: Secondary | ICD-10-CM

## 2020-11-20 MED ORDER — SIMVASTATIN 40 MG PO TABS: 40.0000 mg | ORAL_TABLET | Freq: Every day | ORAL | 1 refills | Status: DC

## 2020-11-23 ENCOUNTER — Encounter: Payer: Self-pay | Admitting: Emergency Medicine

## 2020-11-23 ENCOUNTER — Other Ambulatory Visit: Payer: Self-pay

## 2020-11-23 ENCOUNTER — Ambulatory Visit
Admission: EM | Admit: 2020-11-23 | Discharge: 2020-11-23 | Disposition: A | Discharge status: Home / Self Care | Payer: PPO | Attending: Emergency Medicine | Admitting: Emergency Medicine

## 2020-11-23 DIAGNOSIS — W57XXXA Bitten or stung by nonvenomous insect and other nonvenomous arthropods, initial encounter: Secondary | ICD-10-CM

## 2020-11-23 DIAGNOSIS — S30861A Insect bite (nonvenomous) of abdominal wall, initial encounter: Secondary | ICD-10-CM | POA: Diagnosis not present

## 2020-11-23 MED ORDER — DOXYCYCLINE HYCLATE 100 MG PO CAPS
100.0000 mg | ORAL_CAPSULE | Freq: Two times a day (BID) | ORAL | 0 refills | Status: DC
Start: 2020-11-23 — End: 2020-12-06

## 2020-11-23 NOTE — Discharge Instructions (Addendum)
Prescribed doxycycline.  Take as directed and to completion To prevent tick bites, wear long sleeves, long pants, and light colors. Use insect repellent. Follow the instructions on the bottle. If the tick is biting, do not try to remove it with heat, alcohol, petroleum jelly, or fingernail polish. Use tweezers, curved forceps, or a tick-removal tool to grasp the tick. Gently pull up until the tick lets go. Do not twist or jerk the tick. Do not squeeze or crush the tick. Return here or go to ER if you have any new or worsening symptoms (rash, nausea, vomiting, fever, chills, headache, fatigue)

## 2020-11-23 NOTE — ED Triage Notes (Signed)
Tick bite on stomach that pt noticed today

## 2020-11-23 NOTE — ED Provider Notes (Signed)
[place]   [id number] 11/23/20 Arrival Time: 9233  AQ:TMAU bite  SUBJECTIVE: History from: patient. Lonnie Hurley is a 80 y.o. male Complains of attached tick for one day. Noticed while changing clothes in bathroom.  Localizes the bite to his abdomen.  Has tried removing tick.  Reports previous hx of tick bite.  Denies fever, chills, nausea, vomiting, headache, dizziness, weakness, fatigue, rash, or abdominal pain.    ROS: As per HPI.  All other pertinent ROS negative.     Past Medical History:  Diagnosis Date   BPH (benign prostatic hypertrophy)    Dr. Rosana Hoes / Dr. Joelyn Oms  - Urologist    Cancer [facility])    lip-mole surgery   Cataract    bilateral   Colon polyps    Constipation    occasional - miralax prn   DDD (degenerative disc disease)    Hearing loss    left ear, no hearing aids   Hyperlipidemia    Hypertension    Leukoplakia    Microscopic hematuria    negative work up with Urology in the past.    OAB (overactive bladder)    with past percutaneous tibial nerve stimulation therapy   Thrombocytopenia ([place])    Tinnitus    Tobacco abuse    Vitamin B 12 deficiency    Vitamin D deficiency    Past Surgical History:  Procedure Laterality Date   CATARACT EXTRACTION  2007   right eye   CATARACT EXTRACTION Left 03/21/15   COLONOSCOPY  02/28/2014   [place]   KNEE SURGERY  02-28-2008   replaced inside right knee   Left Knee Repair  11-10-2006   NOSE SURGERY     POLYPECTOMY     colon polyps   SKIN BIOPSY  10-08-2006   left ear   Allergies  Allergen Reactions   Penicillins Other (See Comments)    unknown   Aspirin Other (See Comments)    Causes Blood in urine   Crestor [Rosuvastatin Calcium] Other (See Comments)    Myalgia, "joint problem"   Gabapentin Other (See Comments)    Blurry vision Pt able to take in small doses    Hydrocodone Other (See Comments)    Constipation     Lyrica [Pregabalin]     Blurry vision    Current  Facility-Administered Medications on File Prior to Encounter  Medication Dose Route Frequency Provider Last Rate Last Admin   cyanocobalamin ((VITAMIN B-12)) injection 1,000 mcg  1,000 mcg Intramuscular Q30 days [name] A, FNP   1,000 mcg at 11/01/20 6333   Current Outpatient Medications on File Prior to Encounter  Medication Sig Dispense Refill   acetaminophen (TYLENOL) 500 MG tablet Take 500 mg by mouth every 6 (six) hours as needed.     Calcium Carb-Cholecalciferol (CALCIUM 600 + D PO) Take by mouth 2 (two) times daily.      Cholecalciferol (VITAMIN D3) 2000 UNITS TABS Take 1 tablet by mouth daily.     diclofenac Sodium (VOLTAREN) 1 % GEL Apply 2 g topically 4 (four) times daily. 350 g 1   ezetimibe (ZETIA) 10 MG tablet Take 1 tablet (10 mg total) by mouth daily. 90 tablet 3   gabapentin (NEURONTIN) 100 MG capsule TAKE 1 CAPSULE BY MOUTH THREE TIMES A DAY 90 capsule 0   Glucosamine-Chondroit-Vit C-Mn (GLUCOSAMINE 1500 COMPLEX PO) Take by mouth 2 (two) times daily.      glucose blood (ONE TOUCH ULTRA TEST) test strip Use as  instructed 50 each 11   hydrochlorothiazide (HYDRODIURIL) 12.5 MG tablet Take 1 tablet (12.5 mg total) by mouth daily. 90 tablet 3   meloxicam (MOBIC) 7.5 MG tablet Take 1 tablet (7.5 mg total) by mouth daily. 20 tablet 1   Multiple Vitamins-Minerals (MULTIVITAMIN WITH MINERALS) tablet Take 1 tablet by mouth daily.     oxybutynin (DITROPAN-XL) 10 MG 24 hr tablet Take 10 mg by mouth daily.     polyethylene glycol powder (MIRALAX) 17 GM/SCOOP powder Take 17 g by mouth as needed for moderate constipation. 1700 g 2   ramipril (ALTACE) 10 MG capsule Take 1 capsule (10 mg total) by mouth daily. 90 capsule 3   simvastatin (ZOCOR) 40 MG tablet Take 1 tablet (40 mg total) by mouth at bedtime. 90 tablet 1   tamsulosin (FLOMAX) 0.4 MG CAPS capsule Take 1 capsule (0.4 mg total) by mouth at bedtime. 90 capsule 3   Vitamin D, Ergocalciferol, (DRISDOL) 1.25 MG (50000 UNIT) CAPS  capsule Take 1 capsule (50,000 Units total) by mouth every 7 (seven) days. 12 capsule 3   Social History   Socioeconomic History   Marital status: Single    Spouse name: Not on file   Number of children: 0   Years of education: 10   Highest education level: 10th grade  Occupational History   Occupation: Retired    Comment: retired Immunologist  Tobacco Use   Smoking status: Former    Packs/day: 1.00    Years: 50.00    Pack years: 50.00    Types: Cigarettes    Quit date: 08/14/2009    Years since quitting: 11.2   Smokeless tobacco: Never  Vaping Use   Vaping Use: Never used  Substance and Sexual Activity   Alcohol use: No   Drug use: No   Sexual activity: Not Currently  Other Topics Concern   Not on file  Social History Narrative   Retired Immunologist   Lives at home with Brother, Engineer, petroleum   Social Determinants of Health   Financial Resource Strain: Low Risk    Difficulty of Paying Living Expenses: Not hard at all  Food Insecurity: No Food Insecurity   Worried About Charity fundraiser in the Last Year: Never true   Arboriculturist in the Last Year: Never true  Transportation Needs: No Transportation Needs   Lack of Transportation (Medical): No   Lack of Transportation (Non-Medical): No  Physical Activity: Insufficiently Active   Days of Exercise per Week: 4 days   Minutes of Exercise per Session: 20 min  Stress: No Stress Concern Present   Feeling of Stress : Not at all  Social Connections: Moderately Isolated   Frequency of Communication with Friends and Family: More than three times a week   Frequency of Social Gatherings with Friends and Family: More than three times a week   Attends Religious Services: More than 4 times per year   Active Member of [employer] or Organizations: No   Attends Archivist Meetings: Never   Marital Status: Never married  Human resources officer Violence: Not At Risk   Fear of Current or Ex-Partner: No    Emotionally Abused: No   Physically Abused: No   Sexually Abused: No   Family History  Problem Relation Age of Onset   Heart failure Father    Glaucoma Mother    Osteoporosis Brother    Heart disease Brother 36       stent  Hypertension Brother    Hyperlipidemia Brother    Diabetes Brother    Colon cancer Neg Hx    Stomach cancer Neg Hx    Rectal cancer Neg Hx     OBJECTIVE:  Vitals:   11/23/20 1757  BP: 123/75  Pulse: 74  Resp: 18  Temp: 97.8 F (36.6 C)  TempSrc: Oral  SpO2: 93%    General appearance: ALERT; in no acute distress.  Head: NCAT Lungs: Normal respiratory effort Skin: warm and dry; punctate lesion with partial tick attached, slight overlying erythema to LT lower abdomen, mildly TTP no obvious drainage or bleeding Neurologic: Ambulates without difficulty Psychological: alert and cooperative; normal mood and affect  ASSESSMENT & PLAN:  1. Tick bite of abdomen, initial encounter     Meds ordered this encounter  Medications   doxycycline (VIBRAMYCIN) 100 MG capsule    Sig: Take 1 capsule (100 mg total) by mouth 2 (two) times daily for 14 days.    Dispense:  28 capsule    Refill:  0    Order Specific Question:   Supervising Provider    Answer:   Raylene Everts [[id number]]   Prescribed doxycycline.  Take as directed and to completion To prevent tick bites, wear long sleeves, long pants, and light colors. Use insect repellent. Follow the instructions on the bottle. If the tick is biting, do not try to remove it with heat, alcohol, petroleum jelly, or fingernail polish. Use tweezers, curved forceps, or a tick-removal tool to grasp the tick. Gently pull up until the tick lets go. Do not twist or jerk the tick. Do not squeeze or crush the tick. Return here or go to ER if you have any new or worsening symptoms (rash, nausea, vomiting, fever, chills, headache, fatigue)   Reviewed expectations re: course of current medical issues. Questions  answered. Outlined signs and symptoms indicating need for more acute intervention. Patient verbalized understanding. After Visit Summary given.     Lestine Box, PA-C 11/23/20 1806

## 2020-11-28 DIAGNOSIS — N3941 Urge incontinence: Secondary | ICD-10-CM | POA: Diagnosis not present

## 2020-11-28 DIAGNOSIS — N4883 Acquired buried penis: Secondary | ICD-10-CM | POA: Diagnosis not present

## 2020-11-28 DIAGNOSIS — N401 Enlarged prostate with lower urinary tract symptoms: Secondary | ICD-10-CM | POA: Diagnosis not present

## 2020-11-28 DIAGNOSIS — R3121 Asymptomatic microscopic hematuria: Secondary | ICD-10-CM | POA: Diagnosis not present

## 2020-11-30 ENCOUNTER — Telehealth: Payer: Self-pay | Admitting: Family

## 2020-11-30 ENCOUNTER — Ambulatory Visit: Payer: PPO | Admitting: Pharmacist

## 2020-11-30 DIAGNOSIS — M79605 Pain in left leg: Secondary | ICD-10-CM

## 2020-11-30 MED ORDER — GABAPENTIN 100 MG PO CAPS: ORAL_CAPSULE | ORAL | 1 refills | Status: DC

## 2020-11-30 NOTE — Telephone Encounter (Signed)
90day supply sent.

## 2020-12-03 ENCOUNTER — Telehealth: Payer: Self-pay | Admitting: Family

## 2020-12-03 DIAGNOSIS — M79605 Pain in left leg: Secondary | ICD-10-CM

## 2020-12-03 MED ORDER — GABAPENTIN 100 MG PO CAPS: ORAL_CAPSULE | ORAL | 1 refills | Status: DC

## 2020-12-03 NOTE — Telephone Encounter (Signed)
Prescription changed to a 90 day supply and went to pharmacy.

## 2020-12-04 ENCOUNTER — Ambulatory Visit (INDEPENDENT_AMBULATORY_CARE_PROVIDER_SITE_OTHER): Payer: PPO | Admitting: *Deleted

## 2020-12-04 ENCOUNTER — Other Ambulatory Visit: Payer: Self-pay

## 2020-12-04 DIAGNOSIS — E538 Deficiency of other specified B group vitamins: Secondary | ICD-10-CM

## 2020-12-06 ENCOUNTER — Encounter: Payer: Self-pay | Admitting: Family

## 2020-12-06 ENCOUNTER — Other Ambulatory Visit: Payer: Self-pay

## 2020-12-06 ENCOUNTER — Ambulatory Visit (INDEPENDENT_AMBULATORY_CARE_PROVIDER_SITE_OTHER): Payer: PPO | Admitting: Family

## 2020-12-06 VITALS — BP 132/64 | HR 70 | Temp 97.1°F | Ht 66.0 in | Wt 246.2 lb

## 2020-12-06 DIAGNOSIS — W57XXXA Bitten or stung by nonvenomous insect and other nonvenomous arthropods, initial encounter: Secondary | ICD-10-CM | POA: Diagnosis not present

## 2020-12-06 DIAGNOSIS — S30861A Insect bite (nonvenomous) of abdominal wall, initial encounter: Secondary | ICD-10-CM

## 2020-12-06 NOTE — Progress Notes (Signed)
   Subjective:    Patient ID: Lonnie Hurley, male    DOB: 03-25-1941, 80 y.o.   MRN: [id number]  Chief Complaint  Patient presents with   Tick Removal    HPI Pt presents to the office today for tick bite on left lower abdomen on 11/23/20. He went to the Urgent Care and was given doxycycline 100 mg for 14 days. He wants lab work today.   Denies any fever, headache, rash,  or new joint pain.    Review of Systems  Constitutional:  Positive for fatigue.  All other systems reviewed and are negative.     Objective:   Physical Exam Vitals reviewed.  Constitutional:      General: He is not in acute distress.    Appearance: He is well-developed. He is obese.  HENT:     Head: Normocephalic.     Right Ear: Tympanic membrane normal.     Left Ear: Tympanic membrane normal.  Eyes:     General:        Right eye: No discharge.        Left eye: No discharge.     Pupils: Pupils are equal, round, and reactive to light.  Neck:     Thyroid: No thyromegaly.  Cardiovascular:     Rate and Rhythm: Normal rate and regular rhythm.     Heart sounds: Normal heart sounds. No murmur heard. Pulmonary:     Effort: Pulmonary effort is normal. No respiratory distress.     Breath sounds: Normal breath sounds. No wheezing.  Abdominal:     General: Bowel sounds are normal. There is no distension.     Palpations: Abdomen is soft.     Tenderness: There is no abdominal tenderness.  Musculoskeletal:        General: No tenderness. Normal range of motion.     Cervical back: Normal range of motion and neck supple.  Skin:    General: Skin is warm and dry.     Findings: No erythema or rash.  Neurological:     Mental Status: He is alert and oriented to person, place, and time.     Cranial Nerves: No cranial nerve deficit.     Deep Tendon Reflexes: Reflexes are normal and symmetric.  Psychiatric:        Behavior: Behavior normal.        Thought Content: Thought content normal.        Judgment: Judgment  normal.      BP 132/64   Pulse 70   Temp (!) 97.1 F (36.2 C) (Oral)   Ht 5\' 6"  (1.676 m)   Wt 246 lb 4 oz (111.7 kg)   BMI 39.75 kg/m      Assessment & Plan:  Quatavious Rossa comes in today with chief complaint of Tick Removal   Diagnosis and orders addressed:  1. Tick bite of abdominal wall, initial encounter -Pt to report any new fever, joint pain, or rash -Wear protective clothing while outside- Long sleeves and long pants -Put insect repellent on all exposed skin and along clothing -Take a shower as soon as possible after being outside -RTO if symptoms worsen or do not improve  - [place] spotted fvr abs pnl(IgG+IgM) - Alpha-Gal Panel - Lyme Disease Serology w/Reflex   [name], FNP

## 2020-12-06 NOTE — Patient Instructions (Signed)
Tick Bite Information, Adult Ticks are insects that draw blood for food. Most ticks live in shrubs and grassy and wooded areas. They climb onto people and animals that brush against the leaves and grasses that they rest on. Then they bite, attaching themselves to the skin. Most ticks are harmless, but some ticks may carry germs that can spread to a person through a bite and cause a disease. To reduce your risk of getting a disease from a tick bite, make sure you: Take steps to prevent tick bites. Check for ticks after being outdoors where ticks live. Watch for symptoms of disease if a tick attached to you or if you suspect a tick bite. How can I prevent tick bites? Take these steps to help prevent tick bites when you go outdoors in an areawhere ticks live: Use insect repellent Use insect repellent that has DEET (20% or higher), picaridin, or IR3535 in it. Follow the instructions on the label. Use these products on: Bare skin. The top of your boots. Your pant legs. Your sleeve cuffs. For insect repellent that contains permethrin, follow the instructions on the label. Use these products on: Clothing. Boots. Outdoor gear. Tents. When you are outside Wear protective clothing. Long sleeves and long pants offer the best protection from ticks. Wear light-colored clothing so you can see ticks more easily. Tuck your pant legs into your socks. If you go walking on a trail, stay in the middle of the trail so your skin, hair, and clothing do not touch the bushes. Avoid walking through areas with long grass. Check for ticks on your clothing, hair, and skin often while you are outside, and check again before you go inside. Make sure to check the scalp, neck, armpits, waist, groin, and joint areas. These are the spots where ticks attach themselves most often. When you go indoors Check your clothing for ticks. Tumble dry clothes in a dryer on high heat for at least 10 minutes. If clothes are damp,  additional time may be needed. If clothes require washing, use hot water. Examine gear and pets. Shower soon after being outdoors. Check your body for ticks. Conduct a full body check using a mirror. What is the proper way to remove a tick? If you find a tick on your body, remove it as soon as possible. Removing a tick sooner can prevent germs from passing to your body. Do not remove the tick with your bare fingers. To remove a tick that is crawling on your skin but has not bitten, use either of these methods: Go outdoors and brush the tick off. Remove the tick with tape or a lint roller. To remove a tick that is attached to your skin: Wash your hands. If you have latex gloves, put them on. Use fine-tipped tweezers, curved forceps, or a tick-removal tool to gently grasp the tick as close to your skin and the tick's head as possible. Gently pull with a steady, upward, even pressure until the tick lets go. When removing the tick: Take care to keep the tick's head attached to its body. Do not twist or jerk the tick. This can make the tick's head or mouth parts break off and remain in the skin. Do not squeeze or crush the tick's body. This could force disease-carrying fluids from the tick into your body. Do not try to remove a tick with heat, alcohol, petroleum jelly, or fingernail polish. Using these methods can cause the tick to salivate and regurgitate intoyour bloodstream, increasing your  risk of getting a disease. What should I do after removing a tick? Dispose of the tick. Do not crush a tick with your fingers. Clean the bite area and your hands with soap and water, rubbing alcohol, or an iodine scrub. If an antiseptic cream or ointment is available, apply a small amount to the bite site. Wash and disinfect any instruments that you used to remove the tick. How should I dispose of a tick? To dispose of a live tick, use one of these methods: Place it in rubbing alcohol. Place it in a sealed  bag or container. Wrap it tightly in tape. Flush it down the toilet. Contact a health care provider if: You have symptoms of a disease after a tick bite. Symptoms of a tick-borne disease can occur from moments after the tick bites to 30 days after a tick is removed. Symptoms include: Fever or chills. Any of these signs in the bite area: A red rash that makes a circle (bull's-eye rash) in the bite area. Redness and swelling. Headache. Muscle, joint, or bone pain. Abnormal tiredness. Numbness in your legs or difficulty walking or moving your legs. Tender, swollen lymph glands. A part of a tick breaks off and gets stuck in your skin. Get help right away if: You are not able to remove a tick. You experience muscle weakness or paralysis. Your symptoms get worse or you experience new symptoms. You find an engorged tick on your skin and you are in an area where disease from ticks is a high risk. Summary Ticks may carry germs that can spread to a person through a bite and cause a disease. Wear protective clothing and use insect repellent to prevent tick bites. Follow the instructions on the label. If you find a tick on your body, remove it as soon as possible. If the tick is attached, do not try to remove with heat, alcohol, petroleum jelly, or fingernail polish. Remove the attached tick using fine-tipped tweezers, curved forceps, or a tick-removal tool. Gently pull with steady, upward, even pressure until the tick lets go. Do not twist or jerk the tick. Do not squeeze or crush the tick's body. If you have symptoms of a disease after being bitten by a tick, contact a health care provider. This information is not intended to replace advice given to you by your health care provider. Make sure you discuss any questions you have with your healthcare provider. Document Revised: 05/30/2019 Document Reviewed: 05/30/2019 Elsevier Patient Education  [place].

## 2020-12-09 NOTE — Progress Notes (Signed)
Cardiology Office Note   Date:  12/12/2020   ID:  Lonnie Hurley, DOB 18-Jul-1940, MRN [id number]  PCP:  Sharion Balloon, FNP  Cardiologist:   None Referring:  Sharion Balloon, FNP  Chief Complaint  Patient presents with   Shortness of Breath       History of Present Illness: Lonnie Hurley is a 80 y.o. male who is referred by Sharion Balloon, FNP for evaluation of shortness of breath.  CT from 2019 and he was noted to have some coronary calcium and aortic atherosclerosis.    Perfusion study did not demonstrate any high risk findings.  Echo demonstrated mild AS.  Since I last saw him he has lost a few pounds.  He is very limited by arthritis and walks with a cane.  He can do a little weed eating. The patient denies any new symptoms such as chest discomfort, neck or arm discomfort. There has been no new shortness of breath, PND or orthopnea. There have been no reported palpitations, presyncope or syncope.   Past Medical History:  Diagnosis Date   BPH (benign prostatic hypertrophy)    Dr. Rosana Hoes / Dr. Joelyn Oms  - Urologist    Cancer [facility])    lip-mole surgery   Cataract    bilateral   Colon polyps    Constipation    occasional - miralax prn   DDD (degenerative disc disease)    Hearing loss    left ear, no hearing aids   Hyperlipidemia    Hypertension    Leukoplakia    Microscopic hematuria    negative work up with Urology in the past.    OAB (overactive bladder)    with past percutaneous tibial nerve stimulation therapy   Thrombocytopenia ([place])    Tinnitus    Tobacco abuse    Vitamin B 12 deficiency    Vitamin D deficiency     Past Surgical History:  Procedure Laterality Date   CATARACT EXTRACTION  2007   right eye   CATARACT EXTRACTION Left 03/21/15   COLONOSCOPY  02/28/2014   [place]   KNEE SURGERY  02-28-2008   replaced inside right knee   Left Knee Repair  11-10-2006   NOSE SURGERY     POLYPECTOMY     colon polyps   SKIN BIOPSY  10-08-2006    left ear     Current Outpatient Medications  Medication Sig Dispense Refill   acetaminophen (TYLENOL) 500 MG tablet Take 500 mg by mouth every 6 (six) hours as needed.     Calcium Carb-Cholecalciferol (CALCIUM 600 + D PO) Take by mouth 2 (two) times daily.      diclofenac Sodium (VOLTAREN) 1 % GEL Apply 2 g topically 4 (four) times daily. 350 g 1   ezetimibe (ZETIA) 10 MG tablet Take 1 tablet (10 mg total) by mouth daily. 90 tablet 3   gabapentin (NEURONTIN) 100 MG capsule TAKE 1 CAPSULE BY MOUTH THREE TIMES A DAY 270 capsule 1   Glucosamine-Chondroit-Vit C-Mn (GLUCOSAMINE 1500 COMPLEX PO) Take by mouth 2 (two) times daily.      hydrochlorothiazide (HYDRODIURIL) 12.5 MG tablet Take 1 tablet (12.5 mg total) by mouth daily. 90 tablet 3   meloxicam (MOBIC) 7.5 MG tablet Take 1 tablet (7.5 mg total) by mouth daily. 20 tablet 1   Multiple Vitamins-Minerals (MULTIVITAMIN WITH MINERALS) tablet Take 1 tablet by mouth daily.     polyethylene glycol powder (MIRALAX) 17 GM/SCOOP powder Take 17 g  by mouth as needed for moderate constipation. 1700 g 2   ramipril (ALTACE) 10 MG capsule Take 1 capsule (10 mg total) by mouth daily. 90 capsule 3   simvastatin (ZOCOR) 40 MG tablet Take 1 tablet (40 mg total) by mouth at bedtime. 90 tablet 1   tamsulosin (FLOMAX) 0.4 MG CAPS capsule Take 1 capsule (0.4 mg total) by mouth at bedtime. 90 capsule 3   glucose blood (ONE TOUCH ULTRA TEST) test strip Use as instructed 50 each 11   Vitamin D, Ergocalciferol, (DRISDOL) 1.25 MG (50000 UNIT) CAPS capsule Take 1 capsule (50,000 Units total) by mouth every 7 (seven) days. 12 capsule 3   Current Facility-Administered Medications  Medication Dose Route Frequency Provider Last Rate Last Admin   cyanocobalamin ((VITAMIN B-12)) injection 1,000 mcg  1,000 mcg Intramuscular Q30 days [name] A, FNP   1,000 mcg at 12/04/20 0908    Allergies:   Penicillins, Aspirin, Crestor [rosuvastatin calcium], Gabapentin, Hydrocodone,  and Lyrica [pregabalin]    ROS:  Please see the history of present illness.   Otherwise, review of systems are positive for joint pains.   All other systems are reviewed and negative.    PHYSICAL EXAM: VS:  BP 102/68   Pulse 81   Ht 5\' 6"  (1.676 m)   Wt 242 lb (109.8 kg)   BMI 39.06 kg/m  , BMI Body mass index is 39.06 kg/m. GENERAL:  Well appearing NECK:  No jugular venous distention, waveform within normal limits, carotid upstroke brisk and symmetric, no bruits, no thyromegaly LUNGS:  Clear to auscultation bilaterally CHEST:  Unremarkable HEART:  PMI not displaced or sustained,S1 and S2 within normal limits, no S3, no S4, no clicks, no rubs, soft apical murmur early peaking systolic, no diastolic murmurs ABD:  Flat, positive bowel sounds normal in frequency in pitch, no bruits, no rebound, no guarding, no midline pulsatile mass, no hepatomegaly, no splenomegaly EXT:  2 plus pulses throughout, no edema, no cyanosis no clubbing   EKG:  EKG is   ordered today. The ekg ordered 12/12/2020 demonstrates sinus rhythm, rate 81, axis within normal limits, intervals within normal limits, possible old inferior infarct.   Recent Labs: 10/17/2020: ALT 27; BUN 21; Creatinine, Ser 1.10; Hemoglobin 15.4; Platelets 168; Potassium 4.0; Sodium 147    Lipid Panel    Component Value Date/Time   CHOL 113 10/17/2020 1504   CHOL 120 11/15/2012 0917   TRIG 157 (H) 10/17/2020 1504   TRIG 87 06/23/2013 0908   TRIG 113 11/15/2012 0917   HDL 34 (L) 10/17/2020 1504   HDL 41 06/23/2013 0908   HDL 43 11/15/2012 0917   CHOLHDL 3.3 10/17/2020 1504   LDLCALC 52 10/17/2020 1504   LDLCALC 48 06/23/2013 0908   LDLCALC 54 11/15/2012 0917      Wt Readings from Last 3 Encounters:  12/12/20 242 lb (109.8 kg)  12/06/20 246 lb 4 oz (111.7 kg)  10/18/20 246 lb (111.6 kg)      Other studies Reviewed: Additional studies/ records that were reviewed today include: Labs Review of the above records  demonstrates:  Please see elsewhere in the note.     ASSESSMENT AND PLAN:  DOE:   This is slightly improved since he is lost weight.  I think it probably is weight and deconditioning but no other work-up is indicated.   AS:   This was mild on physical exam.  I do not think repeat echo is indicated.  HTN: The blood pressure is  controlled.  No change in therapy.   DYSLIPIDEMIA:   LDL was 52.  No change in therapy.    LEG PAIN:    ABIs did not suggest vascular disease.  He has neuropathy which is the likely etiology.   DM: A1c was 7.2 which is down from 7.5.  I will defer to his primary provider.    Current medicines are reviewed at length with the patient today.  The patient does not have concerns regarding medicines.  The following changes have been made:  None  Labs/ tests ordered today include:  None  Orders Placed This Encounter  Procedures   EKG 12-Lead      Disposition:   FU with me in 12 months.      Signed, Minus Breeding, MD  12/12/2020 2:33 PM    [facility]

## 2020-12-12 ENCOUNTER — Other Ambulatory Visit: Payer: Self-pay

## 2020-12-12 ENCOUNTER — Ambulatory Visit: Payer: PPO | Admitting: Licensed Clinical Social Worker

## 2020-12-12 ENCOUNTER — Encounter: Payer: Self-pay | Admitting: Cardiology

## 2020-12-12 ENCOUNTER — Ambulatory Visit: Payer: PPO | Admitting: Cardiology

## 2020-12-12 VITALS — BP 102/68 | HR 81 | Ht 66.0 in | Wt 242.0 lb

## 2020-12-12 DIAGNOSIS — E118 Type 2 diabetes mellitus with unspecified complications: Secondary | ICD-10-CM | POA: Diagnosis not present

## 2020-12-12 DIAGNOSIS — E785 Hyperlipidemia, unspecified: Secondary | ICD-10-CM | POA: Diagnosis not present

## 2020-12-12 DIAGNOSIS — I10 Essential (primary) hypertension: Secondary | ICD-10-CM

## 2020-12-12 DIAGNOSIS — M79605 Pain in left leg: Secondary | ICD-10-CM

## 2020-12-12 DIAGNOSIS — R0602 Shortness of breath: Secondary | ICD-10-CM | POA: Diagnosis not present

## 2020-12-12 DIAGNOSIS — I714 Abdominal aortic aneurysm, without rupture, unspecified: Secondary | ICD-10-CM

## 2020-12-12 DIAGNOSIS — E782 Mixed hyperlipidemia: Secondary | ICD-10-CM

## 2020-12-12 DIAGNOSIS — E1169 Type 2 diabetes mellitus with other specified complication: Secondary | ICD-10-CM

## 2020-12-12 DIAGNOSIS — M79604 Pain in right leg: Secondary | ICD-10-CM | POA: Diagnosis not present

## 2020-12-12 DIAGNOSIS — M5136 Other intervertebral disc degeneration, lumbar region: Secondary | ICD-10-CM

## 2020-12-12 NOTE — Patient Instructions (Signed)
Visit Information  PATIENT GOALS:  Goals Addressed             This Visit's Progress    Manage My Emotions; Complete ADLs as able       Timeframe:  Short-Term Goal Priority:  Medium Progress: On Track Start Date:             12/12/20                Expected End Date:           03/11/21            Follow Up Date 01/25/21   Manage Emotions: Complete ADLs as able    Why is this important?   When you are stressed, down or upset, your body reacts too.  For example, your blood pressure may get higher; you may have a headache or stomachache.  When your emotions get the best of you, your body's ability to fight off cold and flu gets weak.  These steps will help you manage your emotions.     Patient Coping Skills: Attends scheduled medical appointments Takes medications as prescribed Has some support from his brother, Dejan Angert  Patient Deficits:  Mobility issues May have some challenges in completing ADls  Patient Goals: In next 30 days,  Patient will attend scheduled medical appointments Will take medications as prescribed Will call RNCM or LCSW as needed for CCM support  Follow Up Plan: LCSW to call client on 01/25/21     [name].Grigor Lipschutz MSW, LCSW Licensed Clinical Social Worker [facility] Care Management [phone]

## 2020-12-12 NOTE — Patient Instructions (Signed)
Medication Instructions:  The current medical regimen is effective;  continue present plan and medications.  *If you need a refill on your cardiac medications before your next appointment, please call your pharmacy*  Follow-Up: At [facility], you and your health needs are our priority.  As part of our continuing mission to provide you with exceptional heart care, we have created designated Provider Care Teams.  These Care Teams include your primary Cardiologist (physician) and Advanced Practice Providers (APPs -  Physician Assistants and Nurse Practitioners) who all work together to provide you with the care you need, when you need it.  We recommend signing up for the patient portal called "MyChart".  Sign up information is provided on this After Visit Summary.  MyChart is used to connect with patients for Virtual Visits (Telemedicine).  Patients are able to view lab/test results, encounter notes, upcoming appointments, etc.  Non-urgent messages can be sent to your provider as well.   To learn more about what you can do with MyChart, go to [email or web address].    Your next appointment:   1 year(s)  The format for your next appointment:   In Person  Provider:   James Hochrein, MD   Thank you for choosing [facility]!!    

## 2020-12-12 NOTE — Chronic Care Management (AMB) (Signed)
Chronic Care Management    Clinical Social Work Note  12/12/2020 Name: Lonnie Hurley MRN: [id number] DOB: 1940-10-23  Lonnie Hurley is a 80 y.o. year old male who is a primary care patient of Sharion Balloon, FNP. The CCM team was consulted to assist the patient with chronic disease management and/or care coordination needs related to: [name] .   Engaged with patient / sister of patient, Lonnie Hurley by telephone for follow up visit in response to provider referral for social work chronic care management and care coordination services.   Consent to Services:  The patient was given information about Chronic Care Management services, agreed to services, and gave verbal consent prior to initiation of services.  Please see initial visit note for detailed documentation.   Patient agreed to services and consent obtained.   Assessment: Review of patient past medical history, allergies, medications, and health status, including review of relevant consultants reports was performed today as part of a comprehensive evaluation and provision of chronic care management and care coordination services.     SDOH (Social Determinants of Health) assessments and interventions performed:  SDOH Interventions    Flowsheet Row Most Recent Value  SDOH Interventions   Depression Interventions/Treatment  --  [informed of LCSW support and of RNCM support]        Advanced Directives Status: See Vynca application for related entries.  CCM Care Plan  Allergies  Allergen Reactions   Penicillins Other (See Comments)    unknown   Aspirin Other (See Comments)    Causes Blood in urine   Crestor [Rosuvastatin Calcium] Other (See Comments)    Myalgia, "joint problem"   Gabapentin Other (See Comments)    Blurry vision Pt able to take in small doses    Hydrocodone Other (See Comments)    Constipation     Lyrica [Pregabalin]     Blurry vision     Outpatient Encounter Medications as of  12/12/2020  Medication Sig   acetaminophen (TYLENOL) 500 MG tablet Take 500 mg by mouth every 6 (six) hours as needed.   Calcium Carb-Cholecalciferol (CALCIUM 600 + D PO) Take by mouth 2 (two) times daily.    diclofenac Sodium (VOLTAREN) 1 % GEL Apply 2 g topically 4 (four) times daily.   ezetimibe (ZETIA) 10 MG tablet Take 1 tablet (10 mg total) by mouth daily.   gabapentin (NEURONTIN) 100 MG capsule TAKE 1 CAPSULE BY MOUTH THREE TIMES A DAY   Glucosamine-Chondroit-Vit C-Mn (GLUCOSAMINE 1500 COMPLEX PO) Take by mouth 2 (two) times daily.    glucose blood (ONE TOUCH ULTRA TEST) test strip Use as instructed   hydrochlorothiazide (HYDRODIURIL) 12.5 MG tablet Take 1 tablet (12.5 mg total) by mouth daily.   meloxicam (MOBIC) 7.5 MG tablet Take 1 tablet (7.5 mg total) by mouth daily.   Multiple Vitamins-Minerals (MULTIVITAMIN WITH MINERALS) tablet Take 1 tablet by mouth daily.   polyethylene glycol powder (MIRALAX) 17 GM/SCOOP powder Take 17 g by mouth as needed for moderate constipation.   ramipril (ALTACE) 10 MG capsule Take 1 capsule (10 mg total) by mouth daily.   simvastatin (ZOCOR) 40 MG tablet Take 1 tablet (40 mg total) by mouth at bedtime.   tamsulosin (FLOMAX) 0.4 MG CAPS capsule Take 1 capsule (0.4 mg total) by mouth at bedtime.   Vitamin D, Ergocalciferol, (DRISDOL) 1.25 MG (50000 UNIT) CAPS capsule Take 1 capsule (50,000 Units total) by mouth every 7 (seven) days.   Facility-Administered Encounter Medications as of 12/12/2020  Medication  cyanocobalamin ((VITAMIN B-12)) injection 1,000 mcg    Patient Active Problem List   Diagnosis Date Noted   Haglund's deformity of right heel 06/27/2019   RMSF [facility] spotted fever) 07/02/2018   Chronic pain of right knee 04/14/2018   Heel pain, chronic, right 03/19/2018   Foot pain, bilateral 03/19/2018   Kidney stone 03/19/2018   Arthritis of back 08/25/2016   AAA (abdominal aortic aneurysm) without rupture ([place]) 07/13/2016    Lumbar spondylosis with myelopathy 07/13/2016   Diabetes mellitus ([place]) 87/86/7672   Metabolic syndrome 09/47/0962   Vitamin B 12 deficiency 12/14/2014   Hereditary and idiopathic peripheral neuropathy 10/17/2014   Vitamin D deficiency 06/29/2014   BPH (benign prostatic hyperplasia) 06/29/2014   Hypertension 11/17/2012   Hyperlipidemia 11/17/2012   DDD (degenerative disc disease), lumbar 11/17/2012   Thrombocytopenia ([place])     Conditions to be addressed/monitored: Monitor client completion of ADLs and other daily activities   Care Plan : [place]  Updates made by Katha Cabal, LCSW since 12/12/2020 12:00 AM     Problem: Coping Skills (General Plan of Care)      Goal: Coping Skills Enhanced; Complete ADLs daily, as able   Start Date: 12/12/2020  Expected End Date: 03/11/2021  This Visit's Progress: On track  Priority: Medium  Note:   Current barriers:   Patient in need of assistance with connecting to community resources for possible help with completing daily ADLs or other daily activities as needed Patient is unable to independently navigate community resource options without care coordination support Mobility issues  Clinical Goals:   Client to call LCSW in next 30 days to discuss client completion of ADLs and other daily activities Client to call LCSW in next 30 days to discuss mobility issues of client  Clinical Interventions:  Collaboration with Sharion Balloon, FNP regarding development and update of comprehensive plan of care as evidenced by provider attestation and co-signature Talked with Lonnie Hurley, sister of client, about client needs Talked with Lonnie Hurley about pain issues of client Talked with Lonnie Hurley about mobility of client (client uses a cane to help him walk) Talked with Lonnie Hurley about medication procurement of client Talked with Lonnie Hurley about family support for Lonnie Hurley, Lonnie Hurley) Talked with Lonnie Hurley about transport needs of client Talked with  Lonnie Hurley about client support from orthopedist Talked with  Lonnie Hurley about relaxation techniques of client (likes to garden and grow things, likes to go to church) Talked with Lonnie Hurley about hearing challenges of client Talked with Lonnie Hurley about health of client brother, Lonnie Hurley. Client and his brother, Lonnie Hurley, reside together. Encouraged Lonnie Hurley to call RNCM Lonnie Hurley as needed to discuss nursing needs of client  Lonnie Hurley is concerned about A1C level of client and client diet management related to Diabetes )  Patient Coping Skills: Attends scheduled medical appointments Takes medications as prescribed Has some support from his brother, Lonnie Hurley  Patient Deficits:  Mobility issues May have some challenges in completing ADLs  Patient Goals: In next 30 days,  Patient will attend scheduled medical appointments Will take medications as prescribed Will call RNCM or LCSW as needed for CCM support  Follow Up Plan: LCSW to call client on 01/25/21    [name].Zhoey Blackstock MSW, LCSW Licensed Clinical Social Worker [facility] Care Management [phone]

## 2020-12-13 ENCOUNTER — Other Ambulatory Visit: Payer: Self-pay | Admitting: Family

## 2020-12-13 MED ORDER — EPINEPHRINE 0.3 MG/0.3ML IJ SOAJ: 0.3000 mg | INTRAMUSCULAR | 2 refills | Status: DC | PRN

## 2020-12-18 LAB — ROCKY MTN SPOTTED FVR ABS PNL(IGG+IGM)
RMSF IgG: POSITIVE — AB
RMSF IgM: 0.5 index (ref 0.00–0.89)

## 2020-12-18 LAB — ALPHA-GAL PANEL
Allergen Lamb IgE: 2.03 kU/L — AB
Beef IgE: 3.65 kU/L — AB
IgE (Immunoglobulin E), Serum: 255 IU/mL (ref 6–495)
O215-IgE Alpha-Gal: 20 kU/L — AB
Pork IgE: 1.76 kU/L — AB

## 2020-12-18 LAB — LYME DISEASE SEROLOGY W/REFLEX: Lyme Total Antibody EIA: NEGATIVE

## 2020-12-18 LAB — RMSF, IGG, IFA: RMSF, IGG, IFA: <1:64 {titer}

## 2020-12-25 ENCOUNTER — Ambulatory Visit (INDEPENDENT_AMBULATORY_CARE_PROVIDER_SITE_OTHER): Payer: PPO | Admitting: *Deleted

## 2020-12-25 DIAGNOSIS — E1169 Type 2 diabetes mellitus with other specified complication: Secondary | ICD-10-CM | POA: Diagnosis not present

## 2020-12-25 DIAGNOSIS — I10 Essential (primary) hypertension: Secondary | ICD-10-CM

## 2020-12-31 ENCOUNTER — Encounter: Payer: Self-pay | Admitting: *Deleted

## 2020-12-31 NOTE — Chronic Care Management (AMB) (Signed)
Chronic Care Management   CCM RN Visit Note  12/25/2020 Name: Lonnie Hurley MRN: [id number] DOB: 05-08-1941  Subjective: Lonnie Hurley is a 80 y.o. year old male who is a primary care patient of Sharion Balloon, FNP. The care management team was consulted for assistance with disease management and care coordination needs.    Engaged with patient face to face for follow up visit in response to provider referral for case management and/or care coordination services.   Consent to Services:  The patient was given information about Chronic Care Management services, agreed to services, and gave verbal consent prior to initiation of services.  Please see initial visit note for detailed documentation.   Patient agreed to services and verbal consent obtained.   Assessment: Review of patient past medical history, allergies, medications, health status, including review of consultants reports, laboratory and other test data, was performed as part of comprehensive evaluation and provision of chronic care management services.   SDOH (Social Determinants of Health) assessments and interventions performed:    CCM Care Plan  Allergies  Allergen Reactions   Penicillins Other (See Comments)    unknown   Aspirin Other (See Comments)    Causes Blood in urine   Crestor [Rosuvastatin Calcium] Other (See Comments)    Myalgia, "joint problem"   Gabapentin Other (See Comments)    Blurry vision Pt able to take in small doses    Hydrocodone Other (See Comments)    Constipation     Lyrica [Pregabalin]     Blurry vision     Outpatient Encounter Medications as of 12/25/2020  Medication Sig   acetaminophen (TYLENOL) 500 MG tablet Take 500 mg by mouth every 6 (six) hours as needed.   Calcium Carb-Cholecalciferol (CALCIUM 600 + D PO) Take by mouth 2 (two) times daily.    diclofenac Sodium (VOLTAREN) 1 % GEL Apply 2 g topically 4 (four) times daily.   EPINEPHrine 0.3 mg/0.3 mL IJ SOAJ injection Inject  0.3 mg into the muscle as needed for anaphylaxis.   ezetimibe (ZETIA) 10 MG tablet Take 1 tablet (10 mg total) by mouth daily.   gabapentin (NEURONTIN) 100 MG capsule TAKE 1 CAPSULE BY MOUTH THREE TIMES A DAY   Glucosamine-Chondroit-Vit C-Mn (GLUCOSAMINE 1500 COMPLEX PO) Take by mouth 2 (two) times daily.    glucose blood (ONE TOUCH ULTRA TEST) test strip Use as instructed   hydrochlorothiazide (HYDRODIURIL) 12.5 MG tablet Take 1 tablet (12.5 mg total) by mouth daily.   meloxicam (MOBIC) 7.5 MG tablet Take 1 tablet (7.5 mg total) by mouth daily.   Multiple Vitamins-Minerals (MULTIVITAMIN WITH MINERALS) tablet Take 1 tablet by mouth daily.   polyethylene glycol powder (MIRALAX) 17 GM/SCOOP powder Take 17 g by mouth as needed for moderate constipation.   ramipril (ALTACE) 10 MG capsule Take 1 capsule (10 mg total) by mouth daily.   simvastatin (ZOCOR) 40 MG tablet Take 1 tablet (40 mg total) by mouth at bedtime.   tamsulosin (FLOMAX) 0.4 MG CAPS capsule Take 1 capsule (0.4 mg total) by mouth at bedtime.   Vitamin D, Ergocalciferol, (DRISDOL) 1.25 MG (50000 UNIT) CAPS capsule Take 1 capsule (50,000 Units total) by mouth every 7 (seven) days.   Facility-Administered Encounter Medications as of 12/25/2020  Medication   cyanocobalamin ((VITAMIN B-12)) injection 1,000 mcg    Patient Active Problem List   Diagnosis Date Noted   Haglund's deformity of right heel 06/27/2019   [facility] spotted fever) 07/02/2018   Chronic pain of  right knee 04/14/2018   Heel pain, chronic, right 03/19/2018   Foot pain, bilateral 03/19/2018   Kidney stone 03/19/2018   Arthritis of back 08/25/2016   AAA (abdominal aortic aneurysm) without rupture ([place]) 07/13/2016   Lumbar spondylosis with myelopathy 07/13/2016   Diabetes mellitus ([place]) 24/26/8341   Metabolic syndrome 96/22/2979   Vitamin B 12 deficiency 12/14/2014   Hereditary and idiopathic peripheral neuropathy 10/17/2014   Vitamin D deficiency  06/29/2014   BPH (benign prostatic hyperplasia) 06/29/2014   Hypertension 11/17/2012   Hyperlipidemia 11/17/2012   DDD (degenerative disc disease), lumbar 11/17/2012   Thrombocytopenia ([place])     Conditions to be addressed/monitored:HTN and DMII  Care Plan : RNCM: Diabetes Type 2 (Adult)  Updates made by Ilean China, RN since 12/31/2020 12:00 AM     Problem: Disease Progression (Diabetes, Type 2)      Long-Range Goal: Prevent Diabetes Complications   This Visit's Progress: On track  Recent Progress: On track  Priority: Medium  Note:   Objective: Lab Results  Component Value Date   HGBA1C 7.2 (H) 10/17/2020   HGBA1C 6.5 04/19/2020   HGBA1C 7.5 (H) 10/18/2019   Lab Results  Component Value Date   LDLCALC 52 10/17/2020   CREATININE 1.10 10/17/2020  Current Barriers:  Chronic Disease Management support and education needs related to diabetes Lacks caregiver support.   Nurse Case Manager Clinical Goal(s):  Patient will work with PCP regarding medical management of diabetes Patient will work with RN Care Manager to address needs related to diabetes management Patient will demonstrate ongoing self-management of diabetes as evidenced by maintaining an A1C below 7  Interventions:  1:1 collaboration with Sharion Balloon, FNP regarding development and update of comprehensive plan of care as evidenced by provider attestation and co-signature Inter-disciplinary care team collaboration (see longitudinal plan of care) Evaluation of current treatment plan related to diabetes and patient's adherence to plan as established by provider. Chart reviewed including relevant office notes and lab result Reviewed medications and discussed importance of compliance Does not routinely check blood sugar at home Discussed diet Limits sugar and other simple carbohydrates Reinforced need to check feet daily for sores or calluses and to call PCP with any abnormal findings Reinforced need to have  yearly eye exams to check for diabetic retinopathy Reviewed upcoming appointments Provided with RN Care Manager contact number and encouraged to reach out as needed  Patient Goals/Self-Care Activities Over the next 90 days, patient will: Check feet daily for sores, cracks, or calluses Let PCP know if you find anything wrong with your feel Limit the amount of sugar and simple carbohydrates in your diet Have a yearly eye exam to check for changes that can happen from diabetes and high blood pressure Keep regular follow-up appointments with your PCP Call RN Care Manager as needed [phone]  Follow Up Plan:  Telephone follow up appointment with care management team member scheduled for: 01/31/21 with [facility] The patient has been provided with contact information for the care management team and has been advised to call with any health related questions or concerns.      Care Plan : RNCM: Hypertension (Adult)  Updates made by Ilean China, RN since 12/31/2020 12:00 AM     Problem: Hypertension (Hypertension)      Long-Range Goal: Hypertension Monitored   This Visit's Progress: On track  Recent Progress: On track  Priority: Medium  Note:   Objective: BP Readings from Last 3 Encounters:  10/17/20 112/70  07/12/20  113/61  06/04/20 114/68   Current Barriers:  Chronic Disease Management support and education needs related to hypertension Lacks family/social support  Nurse Case Manager Clinical Goal(s):  Patient will meet with RN Care Manager to address self-management of hypertension management Patient will work with PCP regarding medical management of hypertension  Interventions:  1:1 collaboration with Sharion Balloon, FNP regarding development and update of comprehensive plan of care as evidenced by provider attestation and co-signature Inter-disciplinary care team collaboration (see longitudinal plan of care) Evaluation of current treatment plan related to hypertension and  patient's adherence to plan as established by provider. Chart reviewed including relevant office notes and lab results Reviewed most recent in-office blood pressure readings Reviewed and discussed medication and importance of compliance Reinforced to continue check and recording blood pressure at least 3 times a week and to bring log to PCP appointments Reminded to eat a low sodium diet and to not add extra salt to food Advised patient to call PCP with any readings outside of recommended range Provided with RN Care Manager contact number and encouraged to reach out as needed  Patient Goals/Self-Care Activities Over the next 90 days, patient will: check blood pressure at least 3 times a week write blood pressure results in a log or diary  Take blood pressure log to PCP appointments Call PCP with any readings outside of recommended range Take medications as directed Read food labels for the amount of sodium in them and don't add extra salt to food Call RN Care Manager as needed [phone]   Follow Up Plan:  Telephone follow up appointment with care management team member scheduled for: [facility] 01/31/21 The patient has been provided with contact information for the care management team and has been advised to call with any health related questions or concerns.       Plan:Telephone follow up appointment with care management team member scheduled for:  01/31/21 with RNCM  Chong Sicilian, BSN, RN-BC [place] [facility] [place] Management Direct Dial: [phone]

## 2020-12-31 NOTE — Patient Instructions (Signed)
Visit Information  PATIENT GOALS:  Goals Addressed             This Visit's Progress    Manage Hypertension   On track    Timeframe:  Long-Range Goal Priority:  Medium Start Date:                             Expected End Date:                       Follow-up: 01/31/21  check blood pressure at least 3 times a week write blood pressure results in a log or diary  Take blood pressure log to PCP appointments Call PCP with any readings outside of recommended range Take medications as directed Read food labels for the amount of sodium in them and don't add extra salt to food Call RN Care Manager as needed [phone]      Prevent Diabetes Complications   On track    Timeframe:  Long-Range Goal Priority:  High Start Date:                             Expected End Date:                       Follow Up Date 01/31/21   Check feet daily for sores, cracks, or calluses Let PCP know if you find anything wrong with your feel Limit the amount of sugar and simple carbohydrates in your diet Have a yearly eye exam to check for changes that can happen from diabetes and high blood pressure Keep regular follow-up appointments with your PCP Call RN Care Manager as needed [phone]          The patient verbalized understanding of instructions, educational materials, and care plan provided today and declined offer to receive copy of patient instructions, educational materials, and care plan.   Telephone follow up appointment with care management team member scheduled for: 01/31/21 with RNCM  Chong Sicilian, BSN, RN-BC [place] [facility] [place] [facility] Direct Dial: [phone]

## 2021-01-04 ENCOUNTER — Ambulatory Visit (INDEPENDENT_AMBULATORY_CARE_PROVIDER_SITE_OTHER): Payer: PPO

## 2021-01-04 ENCOUNTER — Other Ambulatory Visit: Payer: Self-pay

## 2021-01-04 ENCOUNTER — Encounter: Payer: Self-pay | Admitting: Family

## 2021-01-04 ENCOUNTER — Ambulatory Visit (INDEPENDENT_AMBULATORY_CARE_PROVIDER_SITE_OTHER): Payer: PPO | Admitting: Family

## 2021-01-04 VITALS — BP 121/77 | HR 82 | Temp 97.3°F | Ht 66.0 in | Wt 244.8 lb

## 2021-01-04 DIAGNOSIS — L509 Urticaria, unspecified: Secondary | ICD-10-CM

## 2021-01-04 DIAGNOSIS — E538 Deficiency of other specified B group vitamins: Secondary | ICD-10-CM

## 2021-01-04 DIAGNOSIS — Z91018 Allergy to other foods: Secondary | ICD-10-CM | POA: Diagnosis not present

## 2021-01-04 MED ORDER — HYDROXYZINE PAMOATE 25 MG PO CAPS: 25.0000 mg | ORAL_CAPSULE | Freq: Three times a day (TID) | ORAL | 2 refills | Status: DC | PRN

## 2021-01-04 NOTE — Patient Instructions (Addendum)
Alpha-gal Syndrome Alpha-gal syndrome (AGS) is an allergic reaction to a type of sugar commonly called alpha-gal. Alpha-gal is not found in people, fish, reptiles, or birds. It is found in the meat and organ meats of mammals, such as cows, pigs, and sheep. It may also be found in products that come from animals, such as gelatin, medicines, medicine capsules, some milk products, vaccines, andcosmetics. AGS causes an allergic reaction that can be immediate or delayed for several hours and can range from mild to severe. A mild reaction may cause nausea, vomiting, or an itchy rash (hives). A severe reaction can cause breathing difficulties or loss of consciousness (anaphylaxis). This can be life-threatening. What are the causes? This allergy is first triggered by a tick bite from a lone star or blackleg tick. These ticks bite animals, such as cows, pigs, or sheep, and pick up the alpha-gal sugar from their blood. If the same tick bites you, it may cause your body's defense system (immune system) to produce antibodies to alpha-gal and cause the allergic reaction. What increases the risk? People who live in the [place] part of the [place] are at [place] because the lone star tick is common there. People who are hunters and people who work in jobs that involve caring for trees (foresters) have an increased risk of this condition. What are the signs or symptoms? AGS may not cause an allergic reaction every time you eat red meat or come into contact with alpha-gal. If you do have a reaction, symptoms may include: Hives. Severe stomachache. Nausea or vomiting. Swelling of the lips, face, tongue, or throat. Wheezing. Sneezing and runny nose. Headache. Symptoms of anaphylaxis may include: Difficulty breathing. Difficulty swallowing. Dizziness. Fainting. This may happen due to a sudden drop in blood pressure. You may have AGS if you had anaphylaxis after eating something but do not have any  known food allergies. Unlike other food allergies, the reaction does not start soon after the exposure to alpha-gal. There may be a delay of severalhours. How is this diagnosed? This condition may be diagnosed based on signs and symptoms of the condition, especially if you have a history of tick bites and a delayed reaction to red meat. You may also have a blood test to check for antibodies to alpha-gal or askin test to see if there is a reaction to alpha-gal. How is this treated? This condition may be treated by: Avoiding ticks. Frequent tick bites may increase the risk of an AGS reaction. Avoiding meat and organ meats that may contain alpha-gal. Avoiding medicines or other products that may contain alpha-gal. Using medicines to reduce an allergic reaction. Carrying an epinephrine auto-injector to use in case of a severe AGS reaction. AGS should be treated by an allergist or a health care provider who hasexperience with AGS. Follow these instructions at home:  Medicines Take over-the-counter and prescription medicines only as told by your health care provider. Follow instructions from your health care provider about when and how to use an epinephrine auto-injector. General instructions Avoid red meat and organ meat, and check food labels for meat-based ingredients in packaged foods such as soup, gravy, and flavoring. Work with your allergist to find what other foods or products you may need to avoid. Keep all follow-up visits. This is important. How is this prevented? Take steps to prevent tick bites. These include: Avoiding woods and fields with high grass. Wearing clothing protected with the anti-tick chemical permethrin when outdoors in these areas or using a [place]  Environmental Protection Agency-approved insect repellent. Checking your clothing for ticks before you come back indoors. Checking your body for ticks when you take a shower. Contact a health care provider if: You have any  signs or symptoms of AGS food allergy. You have a tick bite that causes a skin reaction. Get help right away if you: Have a severe AGS reaction. Have trouble swallowing or breathing. These symptoms may represent a serious problem that is an emergency. Do not wait to see if the symptoms will go away. Get medical help right away. Call your local emergency services (911 in the [place] Do not drive yourself to the hospital. Summary AGS is a food allergy caused by a tick bite. Red meats, organ meats, and other products or medicines may trigger the alpha-gal reaction. AGS reactions can be mild or severe. If you have AGS, you should not eat red meat, organ meat, or products that may contain alpha-gal. Avoiding tick bites prevents AGS and more serious AGS reactions. This information is not intended to replace advice given to you by your health care provider. Make sure you discuss any questions you have with your healthcare provider. Document Revised: 06/06/2020 Document Reviewed: 06/06/2020 Elsevier Patient Education  2022 [employer].

## 2021-01-04 NOTE — Progress Notes (Signed)
Cyanocobalamin injection given to left deltoid.  Patient tolerated well. 

## 2021-01-04 NOTE — Progress Notes (Signed)
Subjective:    Patient ID: Lonnie Hurley, male    DOB: [birth year], 80 y.o.   MRN: [id number]  Chief Complaint  Patient presents with   back feels hot     Brother told him it looks like heat    PT presents to the office today with complaints of rash on his pack. He reports this comes and goes, but has improved since stopping Beef and pork in his diet. He was diagnosed with Alpha Gal on 12/06/20. He has continued mild and diary.  Rash This is a new problem. The current episode started more than 1 month ago. The problem has been waxing and waning since onset. The affected locations include the back.     Review of Systems  Skin:  Positive for rash.  All other systems reviewed and are negative.     Objective:   Physical Exam Vitals reviewed.  Constitutional:      General: He is not in acute distress.    Appearance: He is well-developed. He is obese.  HENT:     Head: Normocephalic.     Right Ear: Tympanic membrane normal.     Left Ear: Tympanic membrane normal.  Eyes:     General:        Right eye: No discharge.        Left eye: No discharge.     Pupils: Pupils are equal, round, and reactive to light.  Neck:     Thyroid: No thyromegaly.  Cardiovascular:     Rate and Rhythm: Normal rate and regular rhythm.     Heart sounds: Normal heart sounds. No murmur heard. Pulmonary:     Effort: Pulmonary effort is normal. No respiratory distress.     Breath sounds: Normal breath sounds. No wheezing.  Abdominal:     General: Bowel sounds are normal. There is no distension.     Palpations: Abdomen is soft.     Tenderness: There is no abdominal tenderness.  Musculoskeletal:        General: No tenderness. Normal range of motion.     Cervical back: Normal range of motion and neck supple.  Skin:    General: Skin is warm and dry.     Findings: No erythema or rash.     Comments:  No hives at this time  Neurological:     Mental Status: He is alert and oriented to person, place, and  time.     Cranial Nerves: No cranial nerve deficit.     Deep Tendon Reflexes: Reflexes are normal and symmetric.  Psychiatric:        Behavior: Behavior normal.        Thought Content: Thought content normal.        Judgment: Judgment normal.     BP 121/77   Pulse 82   Temp (!) 97.3 F (36.3 C) (Temporal)   Ht '5\' 6"'$  (1.676 m)   Wt 244 lb 12.8 oz (111 kg)   SpO2 93%   BMI 39.51 kg/m       Assessment & Plan:  Lonnie Hurley comes in today with chief complaint of back feels hot  (Brother told him it looks like heat )   Diagnosis and orders addressed:  1. Allergy to alpha-gal - hydrOXYzine (VISTARIL) 25 MG capsule; Take 1 capsule (25 mg total) by mouth 3 (three) times daily as needed.  Dispense: 60 capsule; Refill: 2  2. Hives - hydrOXYzine (VISTARIL) 25 MG capsule; Take 1 capsule (25 mg total)  by mouth 3 (three) times daily as needed.  Dispense: 60 capsule; Refill: 2  Avoid beef, pork, and lamb He states he has been doing this, but eating diary. Avoid this.  Benadryl as needed Go to ED with any SOB or swelling of tongue.   [name], FNP

## 2021-01-25 ENCOUNTER — Ambulatory Visit (INDEPENDENT_AMBULATORY_CARE_PROVIDER_SITE_OTHER): Payer: PPO | Admitting: Licensed Clinical Social Worker

## 2021-01-25 DIAGNOSIS — I714 Abdominal aortic aneurysm, without rupture, unspecified: Secondary | ICD-10-CM

## 2021-01-25 DIAGNOSIS — E782 Mixed hyperlipidemia: Secondary | ICD-10-CM | POA: Diagnosis not present

## 2021-01-25 DIAGNOSIS — M479 Spondylosis, unspecified: Secondary | ICD-10-CM | POA: Diagnosis not present

## 2021-01-25 DIAGNOSIS — E1169 Type 2 diabetes mellitus with other specified complication: Secondary | ICD-10-CM | POA: Diagnosis not present

## 2021-01-25 DIAGNOSIS — I10 Essential (primary) hypertension: Secondary | ICD-10-CM

## 2021-01-25 DIAGNOSIS — M5136 Other intervertebral disc degeneration, lumbar region: Secondary | ICD-10-CM

## 2021-01-25 NOTE — Chronic Care Management (AMB) (Signed)
Chronic Care Management    Clinical Social Work Note  01/25/2021 Name: Lonnie Hurley MRN: [id number] DOB: [birth year]  Lonnie Hurley is a 80 y.o. year old male who is a primary care patient of Lonnie Balloon, FNP. The CCM team was consulted to assist the patient with chronic disease management and/or care coordination needs related to: [name] .   Engaged with patient / sister of patient, Lonnie Hurley, by telephone for follow up visit in response to provider referral for social work chronic care management and care coordination services.   Consent to Services:  The patient was given information about Chronic Care Management services, agreed to services, and gave verbal consent prior to initiation of services.  Please see initial visit note for detailed documentation.   Patient agreed to services and consent obtained.   Assessment: Review of patient past medical history, allergies, medications, and health status, including review of relevant consultants reports was performed today as part of a comprehensive evaluation and provision of chronic care management and care coordination services.     SDOH (Social Determinants of Health) assessments and interventions performed:  SDOH Interventions    Flowsheet Row Most Recent Value  SDOH Interventions   Physical Activity Interventions Other (Comments)  [uses a cane to help him walk,  has knee pain]  Depression Interventions/Treatment  --  [informed sister of client of LCSW support for client and of RNCM support for client]        Advanced Directives Status: See Vynca application for related entries.  CCM Care Plan  Allergies  Allergen Reactions   Penicillins Other (See Comments)    unknown   Aspirin Other (See Comments)    Causes Blood in urine   Crestor [Rosuvastatin Calcium] Other (See Comments)    Myalgia, "joint problem"   Gabapentin Other (See Comments)    Blurry vision Pt able to take in small doses     Hydrocodone Other (See Comments)    Constipation     Lyrica [Pregabalin]     Blurry vision     Outpatient Encounter Medications as of 01/25/2021  Medication Sig   acetaminophen (TYLENOL) 500 MG tablet Take 500 mg by mouth every 6 (six) hours as needed.   Calcium Carb-Cholecalciferol (CALCIUM 600 + D PO) Take by mouth 2 (two) times daily.    diclofenac Sodium (VOLTAREN) 1 % GEL Apply 2 g topically 4 (four) times daily.   EPINEPHrine 0.3 mg/0.3 mL IJ SOAJ injection Inject 0.3 mg into the muscle as needed for anaphylaxis.   ezetimibe (ZETIA) 10 MG tablet Take 1 tablet (10 mg total) by mouth daily.   gabapentin (NEURONTIN) 100 MG capsule TAKE 1 CAPSULE BY MOUTH THREE TIMES A DAY   Glucosamine-Chondroit-Vit C-Mn (GLUCOSAMINE 1500 COMPLEX PO) Take by mouth 2 (two) times daily.    glucose blood (ONE TOUCH ULTRA TEST) test strip Use as instructed   hydrochlorothiazide (HYDRODIURIL) 12.5 MG tablet Take 1 tablet (12.5 mg total) by mouth daily.   hydrOXYzine (VISTARIL) 25 MG capsule Take 1 capsule (25 mg total) by mouth 3 (three) times daily as needed.   meloxicam (MOBIC) 7.5 MG tablet Take 1 tablet (7.5 mg total) by mouth daily.   Multiple Vitamins-Minerals (MULTIVITAMIN WITH MINERALS) tablet Take 1 tablet by mouth daily.   polyethylene glycol powder (MIRALAX) 17 GM/SCOOP powder Take 17 g by mouth as needed for moderate constipation.   ramipril (ALTACE) 10 MG capsule Take 1 capsule (10 mg total) by mouth daily.   simvastatin (ZOCOR)  40 MG tablet Take 1 tablet (40 mg total) by mouth at bedtime.   tamsulosin (FLOMAX) 0.4 MG CAPS capsule Take 1 capsule (0.4 mg total) by mouth at bedtime.   Vitamin D, Ergocalciferol, (DRISDOL) 1.25 MG (50000 UNIT) CAPS capsule Take 1 capsule (50,000 Units total) by mouth every 7 (seven) days.   Facility-Administered Encounter Medications as of 01/25/2021  Medication   cyanocobalamin ((VITAMIN B-12)) injection 1,000 mcg    Patient Active Problem List   Diagnosis  Date Noted   Allergy to alpha-gal 01/04/2021   Haglund's deformity of right heel 06/27/2019   RMS[facility] spotted fever) 07/02/2018   Chronic pain of right knee 04/14/2018   Heel pain, chronic, right 03/19/2018   Foot pain, bilateral 03/19/2018   Kidney stone 03/19/2018   Arthritis of back 08/25/2016   AAA (abdominal aortic aneurysm) without rupture ([place]) 07/13/2016   Lumbar spondylosis with myelopathy 07/13/2016   Diabetes mellitus ([place]) 123XX123   Metabolic syndrome [id number]   Vitamin B 12 deficiency 12/14/2014   Hereditary and idiopathic peripheral neuropathy 10/17/2014   Vitamin D deficiency 06/29/2014   BPH (benign prostatic hyperplasia) 06/29/2014   Hypertension 11/17/2012   Hyperlipidemia 11/17/2012   DDD (degenerative disc disease), lumbar 11/17/2012   Thrombocytopenia ([place])     Conditions to be addressed/monitored: monitor client management of mobility issues; monitor client completion of ADLs, as he is able   Care Plan : LCSW care plan  Updates made by Lonnie Cabal, LCSW since 01/25/2021 12:00 AM     Problem: Coping Skills (General Plan of Care)      Goal: Coping Skills Enhanced; Manage mobility needs; complete ADLs as able   Start Date: 01/25/2021  Expected End Date: 04/24/2021  This Visit's Progress: On track  Priority: Medium  Note:   Current barriers:   Patient in need of assistance with connecting to community resources for possible help with client completion of ADLs Pain issues Mobility issues  Clinical Goals:  LCSW to communicate with client or sister of client in next 30 days to discuss mobility needs of client and to discuss client completion of ADLs Client to call RNCM or LCSW in next 30 days as needed to discuss needs of client  Clinical Interventions:  Collaboration with Lonnie Balloon, FNP regarding development and update of comprehensive plan of care as evidenced by provider attestation and co-signature Assessment of needs,  barriers of client  Talked via phone today with Lonnie Hurley, sister of client, about current client needs Talked with Lonnie Hurley about mobility needs of client Lonnie Hurley said that client uses a cane to help him walk and said client has knee pain issues) Talked with Lonnie Hurley about relaxation activities for client (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 She said she thought generally his mood was stable and he had no mood issues. She said it is hard for him to not be as active outdoors as he previously had been Talked with Lonnie Hurley about family support for Lonnie Hurley. She said Lonnie Hurley , client brother, resides with client. She said Lonnie Hurley does cooking for client and himself. Talked with Lonnie Hurley about some challenges client may have in standing Talked with Lonnie Hurley about medication procurement for client Talked with Lonnie Hurley about CCM program support Encouraged client or Lonnie Hurley to call  RNCM or LCSW as needed related to CCM support for Lonnie Hurley  Patient Coping Skills: Takes medications as prescribed Completes ADLs Attends scheduled medical appointments  Patient Challenges/Deficits: Some walking issues (uses a cane to help him walk) Pain issues  Patient Goals: In next 30 days, patient will: Talk with brother Lonnie Hurley or his sister, Lonnie Hurley about client ongoing needs Attend scheduled medical appointments Allow time to rest and practice self care -  Follow Up Plan: LCSW to call client or sister of client on 03/06/21 to assess needs of client    Lonnie Hurley MSW, LCSW Licensed Clinical Social Worker [facility] Care Management [phone]

## 2021-01-25 NOTE — Patient Instructions (Signed)
Visit Information  PATIENT GOALS:  Goals Addressed             This Visit's Progress    Manage My Emotions; Complete ADLs as able; Manage mobility needs       Timeframe:  Short-Term Goal Priority:  Medium Progress: On Track Start Date:             01/25/21                Expected End Date:           04/24/21            Follow Up Date 03/06/21   Manage Emotions: Complete ADLs as able ;Manage mobility needs   Why is this important?   When you are stressed, down or upset, your body reacts too.  For example, your blood pressure may get higher; you may have a headache or stomachache.  When your emotions get the best of you, your body's ability to fight off cold and flu gets weak.  These steps will help you manage your emotions.     Patient Coping Skills: Attends scheduled medical appointments Takes medications as prescribed Has some support from his brother, Yahmir Gish  Patient Deficits:  Mobility issues May have some challenges in completing ADls  Patient Goals: In next 30 days,  Patient will attend scheduled medical appointments Will take medications as prescribed Will call RNCM or LCSW as needed for CCM support  Follow Up Plan: LCSW to call client or sister of client on 03/06/21    [name].Jyair Kiraly MSW, LCSW Licensed Clinical Social Worker [facility] Care Management [phone]

## 2021-01-31 ENCOUNTER — Telehealth: Payer: PPO | Admitting: *Deleted

## 2021-02-01 ENCOUNTER — Ambulatory Visit (INDEPENDENT_AMBULATORY_CARE_PROVIDER_SITE_OTHER): Payer: PPO | Admitting: *Deleted

## 2021-02-01 ENCOUNTER — Other Ambulatory Visit: Payer: Self-pay

## 2021-02-01 DIAGNOSIS — E538 Deficiency of other specified B group vitamins: Secondary | ICD-10-CM | POA: Diagnosis not present

## 2021-03-05 ENCOUNTER — Other Ambulatory Visit: Payer: Self-pay

## 2021-03-05 ENCOUNTER — Ambulatory Visit (INDEPENDENT_AMBULATORY_CARE_PROVIDER_SITE_OTHER): Payer: PPO

## 2021-03-05 DIAGNOSIS — Z85828 Personal history of other malignant neoplasm of skin: Secondary | ICD-10-CM | POA: Diagnosis not present

## 2021-03-05 DIAGNOSIS — E538 Deficiency of other specified B group vitamins: Secondary | ICD-10-CM

## 2021-03-05 DIAGNOSIS — L308 Other specified dermatitis: Secondary | ICD-10-CM | POA: Diagnosis not present

## 2021-03-05 NOTE — Progress Notes (Signed)
Cyanocobalamin injection given to left deltoid.  Patient aware of results.

## 2021-03-06 ENCOUNTER — Other Ambulatory Visit: Payer: Self-pay | Admitting: *Deleted

## 2021-03-06 ENCOUNTER — Ambulatory Visit (INDEPENDENT_AMBULATORY_CARE_PROVIDER_SITE_OTHER): Payer: PPO | Admitting: Licensed Clinical Social Worker

## 2021-03-06 DIAGNOSIS — I714 Abdominal aortic aneurysm, without rupture, unspecified: Secondary | ICD-10-CM

## 2021-03-06 DIAGNOSIS — E1169 Type 2 diabetes mellitus with other specified complication: Secondary | ICD-10-CM

## 2021-03-06 DIAGNOSIS — M5136 Other intervertebral disc degeneration, lumbar region: Secondary | ICD-10-CM

## 2021-03-06 DIAGNOSIS — I10 Essential (primary) hypertension: Secondary | ICD-10-CM

## 2021-03-06 DIAGNOSIS — M479 Spondylosis, unspecified: Secondary | ICD-10-CM

## 2021-03-06 DIAGNOSIS — E782 Mixed hyperlipidemia: Secondary | ICD-10-CM

## 2021-03-06 MED ORDER — HYDROCHLOROTHIAZIDE 12.5 MG PO TABS: 12.5000 mg | ORAL_TABLET | Freq: Every day | ORAL | 0 refills | Status: DC

## 2021-03-06 NOTE — Patient Instructions (Signed)
Visit Information  PATIENT GOALS:  Goals Addressed             This Visit's Progress    Manage My Emotions; Complete ADLs as able; Manage mobility needs       Timeframe:  Short-Term Goal Priority:  Medium Progress: On Track Start Date:             01/25/21                Expected End Date:           05/23/21            Follow Up Date 04/19/21 at 1:30 PM   Manage Emotions: Complete ADLs as able ;Manage mobility needs   Why is this important?   When you are stressed, down or upset, your body reacts too.  For example, your blood pressure may get higher; you may have a headache or stomachache.  When your emotions get the best of you, your body's ability to fight off cold and flu gets weak.  These steps will help you manage your emotions.     Patient Coping Skills: Attends scheduled medical appointments Takes medications as prescribed Has some support from his brother, Hershey Knauer  Patient Deficits:  Mobility issues May have some challenges in completing ADls  Patient Goals: In next 30 days,  Patient will attend scheduled medical appointments Will take medications as prescribed Will call RNCM or LCSW as needed for CCM support  Follow Up Plan: LCSW to call client or sister of client on 04/19/21 at 1:30 PM to assess needs of client at that time     [name].Halie Gass MSW, LCSW Licensed Clinical Social Worker [facility] Care Management [phone]

## 2021-03-06 NOTE — Chronic Care Management (AMB) (Signed)
Chronic Care Management    Clinical Social Work Note  03/06/2021 Name: Lonnie Hurley MRN: [id number] DOB: Nov 25, 1940  Lonnie Hurley is a 80 y.o. year old male who is a primary care patient of Lonnie Balloon, FNP. The CCM team was consulted to assist the patient with chronic disease management and/or care coordination needs related to: [name] .   Engaged with patient Lonnie Hurley of patient, Lonnie Hurley, by telephone for follow up visit in response to provider referral for social work chronic care management and care coordination services.   Consent to Services:  The patient was given information about Chronic Care Management services, agreed to services, and gave verbal consent prior to initiation of services.  Please see initial visit note for detailed documentation.   Patient agreed to services and consent obtained.   Assessment: Review of patient past medical history, allergies, medications, and health status, including review of relevant consultants reports was performed today as part of a comprehensive evaluation and provision of chronic care management and care coordination services.     SDOH (Social Determinants of Health) assessments and interventions performed:  SDOH Interventions    Flowsheet Row Most Recent Value  SDOH Interventions   Physical Activity Interventions Other (Comments)  [client has some ambulation challenges,  client uses a cane to help him walk]  Depression Interventions/Treatment  --  [informed Lonnie Hurley, sister of client , of LCSW support for client and of RNCM support for client]        Advanced Directives Status: See Vynca application for related entries.  CCM Care Plan  Allergies  Allergen Reactions   Penicillins Other (See Comments)    unknown   Aspirin Other (See Comments)    Causes Blood in urine   Crestor [Rosuvastatin Calcium] Other (See Comments)    Myalgia, "joint problem"   Gabapentin Other (See Comments)    Blurry  vision Pt able to take in small doses    Hydrocodone Other (See Comments)    Constipation     Lyrica [Pregabalin]     Blurry vision     Outpatient Encounter Medications as of 03/06/2021  Medication Sig   acetaminophen (TYLENOL) 500 MG tablet Take 500 mg by mouth every 6 (six) hours as needed.   Calcium Carb-Cholecalciferol (CALCIUM 600 + D PO) Take by mouth 2 (two) times daily.    diclofenac Sodium (VOLTAREN) 1 % GEL Apply 2 g topically 4 (four) times daily.   EPINEPHrine 0.3 mg/0.3 mL IJ SOAJ injection Inject 0.3 mg into the muscle as needed for anaphylaxis.   ezetimibe (ZETIA) 10 MG tablet Take 1 tablet (10 mg total) by mouth daily.   gabapentin (NEURONTIN) 100 MG capsule TAKE 1 CAPSULE BY MOUTH THREE TIMES A DAY   Glucosamine-Chondroit-Vit C-Mn (GLUCOSAMINE 1500 COMPLEX PO) Take by mouth 2 (two) times daily.    glucose blood (ONE TOUCH ULTRA TEST) test strip Use as instructed   hydrochlorothiazide (HYDRODIURIL) 12.5 MG tablet Take 1 tablet (12.5 mg total) by mouth daily.   hydrOXYzine (VISTARIL) 25 MG capsule Take 1 capsule (25 mg total) by mouth 3 (three) times daily as needed.   meloxicam (MOBIC) 7.5 MG tablet Take 1 tablet (7.5 mg total) by mouth daily.   Multiple Vitamins-Minerals (MULTIVITAMIN WITH MINERALS) tablet Take 1 tablet by mouth daily.   polyethylene glycol powder (MIRALAX) 17 GM/SCOOP powder Take 17 g by mouth as needed for moderate constipation.   ramipril (ALTACE) 10 MG capsule Take 1 capsule (10 mg total) by mouth  daily.   simvastatin (ZOCOR) 40 MG tablet Take 1 tablet (40 mg total) by mouth at bedtime.   tamsulosin (FLOMAX) 0.4 MG CAPS capsule Take 1 capsule (0.4 mg total) by mouth at bedtime.   Vitamin D, Ergocalciferol, (DRISDOL) 1.25 MG (50000 UNIT) CAPS capsule Take 1 capsule (50,000 Units total) by mouth every 7 (seven) days.   Facility-Administered Encounter Medications as of 03/06/2021  Medication   cyanocobalamin ((VITAMIN B-12)) injection 1,000 mcg     Patient Active Problem List   Diagnosis Date Noted   Allergy to alpha-gal 01/04/2021   Haglund's deformity of right heel 06/27/2019   RMSF [facility] spotted fever) 07/02/2018   Chronic pain of right knee 04/14/2018   Heel pain, chronic, right 03/19/2018   Foot pain, bilateral 03/19/2018   Kidney stone 03/19/2018   Arthritis of back 08/25/2016   AAA (abdominal aortic aneurysm) without rupture ([place]) 07/13/2016   Lumbar spondylosis with myelopathy 07/13/2016   Diabetes mellitus ([place]) 01/60/1093   Metabolic syndrome 23/55/7322   Vitamin B 12 deficiency 12/14/2014   Hereditary and idiopathic peripheral neuropathy 10/17/2014   Vitamin D deficiency 06/29/2014   BPH (benign prostatic hyperplasia) 06/29/2014   Hypertension 11/17/2012   Hyperlipidemia 11/17/2012   DDD (degenerative disc disease), lumbar 11/17/2012   Thrombocytopenia ([place])     Conditions to be addressed/monitored: monitor client completion of ADLs  Care Plan : LCSW Care Plan  Updates made by Katha Cabal, LCSW since 03/06/2021 12:00 AM     Problem: Coping Skills (General Plan of Care)      Goal: Coping Skills Enhanced; complete ADLs as able   Start Date: 03/06/2021  Expected End Date: 06/03/2021  This Visit's Progress: On track  Priority: Medium  Note:   Current barriers:   Patient in need of assistance with connecting to community resources for possible help in completing ADLs Patient is unable to independently navigate community resource options without care coordination support Some pain issues Some mobility issues  Clinical Goals:  LCSW to call client in next 30 days to discuss ADLs completion of client LCSW to call client in next 30 days to discuss pain issues of client and mobility issues of client Client or sister, Lonnie Hurley, to call RNCM in next 30 days as needed to discuss nursing needs of client  Clinical Interventions:  Collaboration with Lonnie Balloon, FNP regarding development and  update of comprehensive plan of care as evidenced by provider attestation and co-signature Assessment of needs, barriers of client Talked with Lonnie Hurley, sister of client, about client needs Talked with Lonnie Hurley about sleeping issues of client Talked with Lonnie Hurley about walking challenges of client. Lonnie Hurley said that client uses a cane to help him walk Talked with Lonnie Hurley about pain issues of client. She said client has back pain issues and has knee pain issues Talked with Lonnie Hurley about transport needs of client Talked with Lonnie Hurley about meal provision for client. Lonnie Hurley said that Melanie Crazier, brother of client, resides with [name]. Alveta Heimlich does most of the cooking for himself and for client Talked with Lonnie Hurley about client hobbies. Client likes to be outdoors and mow yard and care for fruit trees.   Talked with Lonnie Hurley about mood of client. She said that client mood was positive  Patient strengths: Has strong family support Has no transport needs Has meals provided for him and eats meals with his brother, Alveta Heimlich  Patient Deficits Some Pain issues Some mobility issues  Patient Goals: In next 30 days, patient will:  Attend scheduled medical appointments Allow time for rest and relaxation Contact RNCM as needed for nursing support -  Follow Up Plan: LCSW to call client or sister, Lonnie Hurley, on 04/19/21 at 1:30 PM to assess client needs     [name].Evadna Donaghy MSW, LCSW Licensed Clinical Social Worker [facility] Care Management [phone]

## 2021-03-14 DIAGNOSIS — H6123 Impacted cerumen, bilateral: Secondary | ICD-10-CM | POA: Diagnosis not present

## 2021-03-14 DIAGNOSIS — H903 Sensorineural hearing loss, bilateral: Secondary | ICD-10-CM | POA: Diagnosis not present

## 2021-03-15 DIAGNOSIS — I10 Essential (primary) hypertension: Secondary | ICD-10-CM

## 2021-03-15 DIAGNOSIS — E1169 Type 2 diabetes mellitus with other specified complication: Secondary | ICD-10-CM

## 2021-03-15 DIAGNOSIS — E782 Mixed hyperlipidemia: Secondary | ICD-10-CM | POA: Diagnosis not present

## 2021-03-15 DIAGNOSIS — M479 Spondylosis, unspecified: Secondary | ICD-10-CM

## 2021-03-20 ENCOUNTER — Ambulatory Visit (INDEPENDENT_AMBULATORY_CARE_PROVIDER_SITE_OTHER): Payer: PPO

## 2021-03-20 ENCOUNTER — Other Ambulatory Visit: Payer: Self-pay

## 2021-03-20 DIAGNOSIS — Z23 Encounter for immunization: Secondary | ICD-10-CM | POA: Diagnosis not present

## 2021-04-03 ENCOUNTER — Other Ambulatory Visit: Payer: Self-pay

## 2021-04-03 ENCOUNTER — Encounter: Payer: Self-pay | Admitting: Family

## 2021-04-03 ENCOUNTER — Ambulatory Visit (INDEPENDENT_AMBULATORY_CARE_PROVIDER_SITE_OTHER): Payer: PPO | Admitting: Family

## 2021-04-03 VITALS — BP 109/62 | HR 77 | Temp 98.1°F | Resp 20 | Ht 66.0 in | Wt 244.0 lb

## 2021-04-03 DIAGNOSIS — G609 Hereditary and idiopathic neuropathy, unspecified: Secondary | ICD-10-CM | POA: Diagnosis not present

## 2021-04-03 DIAGNOSIS — N401 Enlarged prostate with lower urinary tract symptoms: Secondary | ICD-10-CM | POA: Diagnosis not present

## 2021-04-03 DIAGNOSIS — Z91018 Allergy to other foods: Secondary | ICD-10-CM

## 2021-04-03 DIAGNOSIS — I10 Essential (primary) hypertension: Secondary | ICD-10-CM | POA: Diagnosis not present

## 2021-04-03 DIAGNOSIS — E782 Mixed hyperlipidemia: Secondary | ICD-10-CM | POA: Diagnosis not present

## 2021-04-03 DIAGNOSIS — E1169 Type 2 diabetes mellitus with other specified complication: Secondary | ICD-10-CM | POA: Diagnosis not present

## 2021-04-03 DIAGNOSIS — E538 Deficiency of other specified B group vitamins: Secondary | ICD-10-CM

## 2021-04-03 LAB — BAYER DCA HB A1C WAIVED: HB A1C (BAYER DCA - WAIVED): 6.7 % — ABNORMAL HIGH (ref 4.8–5.6)

## 2021-04-03 NOTE — Patient Instructions (Signed)
Benign Prostatic Hyperplasia Benign prostatic hyperplasia (BPH) is an enlarged prostate gland that is caused by the normal aging process and not by cancer. The prostate is a walnut-sized gland that is involved in the production of semen. It is located in front of the rectum and below the bladder. The bladder stores urine and the urethra is the tube that carries the urine out of the body. The prostate may get bigger as a man gets older. An enlarged prostate can press on the urethra. This can make it harder to pass urine. The build-up of urine in the bladder can cause infection. Back pressure and infection may progress to bladder damage and kidney (renal) failure. What are the causes? This condition is part of a normal aging process. However, not all men develop problems from this condition. If the prostate enlarges away from the urethra, urine flow will not be blocked. If it enlarges toward the urethra and compresses it, there will be problems passing urine. What increases the risk? This condition is more likely to develop in men over the age of 50 years. What are the signs or symptoms? Symptoms of this condition include: Getting up often during the night to urinate. Needing to urinate frequently during the day. Difficulty starting urine flow. Decrease in size and strength of your urine stream. Leaking (dribbling) after urinating. Inability to pass urine. This needs immediate treatment. Inability to completely empty your bladder. Pain when you pass urine. This is more common if there is also an infection. Urinary tract infection (UTI). How is this diagnosed? This condition is diagnosed based on your medical history, a physical exam, and your symptoms. Tests will also be done, such as: A post-void bladder scan. This measures any amount of urine that may remain in your bladder after you finish urinating. A digital rectal exam. In a rectal exam, your health care provider checks your prostate by  putting a lubricated, gloved finger into your rectum to feel the back of your prostate gland. This exam detects the size of your gland and any abnormal lumps or growths. An exam of your urine (urinalysis). A prostate specific antigen (PSA) screening. This is a blood test used to screen for prostate cancer. An ultrasound. This test uses sound waves to electronically produce a picture of your prostate gland. Your health care provider may refer you to a specialist in kidney and prostate diseases (urologist). How is this treated? Once symptoms begin, your health care provider will monitor your condition (active surveillance or watchful waiting). Treatment for this condition will depend on the severity of your condition. Treatment may include: Observation and yearly exams. This may be the only treatment needed if your condition and symptoms are mild. Medicines to relieve your symptoms, including: Medicines to shrink the prostate. Medicines to relax the muscle of the prostate. Surgery in severe cases. Surgery may include: Prostatectomy. In this procedure, the prostate tissue is removed completely through an open incision or with a laparoscope or robotics. Transurethral resection of the prostate (TURP). In this procedure, a tool is inserted through the opening at the tip of the penis (urethra). It is used to cut away tissue of the inner core of the prostate. The pieces are removed through the same opening of the penis. This removes the blockage. Transurethral incision (TUIP). In this procedure, small cuts are made in the prostate. This lessens the prostate's pressure on the urethra. Transurethral microwave thermotherapy (TUMT). This procedure uses microwaves to create heat. The heat destroys and removes a   small amount of prostate tissue. Transurethral needle ablation (TUNA). This procedure uses radio frequencies to destroy and remove a small amount of prostate tissue. Interstitial laser coagulation (ILC).  This procedure uses a laser to destroy and remove a small amount of prostate tissue. Transurethral electrovaporization (TUVP). This procedure uses electrodes to destroy and remove a small amount of prostate tissue. Prostatic urethral lift. This procedure inserts an implant to push the lobes of the prostate away from the urethra. Follow these instructions at home: Take over-the-counter and prescription medicines only as told by your health care provider. Monitor your symptoms for any changes. Contact your health care provider with any changes. Avoid drinking large amounts of liquid before going to bed or out in public. Avoid or reduce how much caffeine or alcohol you drink. Give yourself time when you urinate. Keep all follow-up visits as told by your health care provider. This is important. Contact a health care provider if: You have unexplained back pain. Your symptoms do not get better with treatment. You develop side effects from the medicine you are taking. Your urine becomes very dark or has a bad smell. Your lower abdomen becomes distended and you have trouble passing your urine. Get help right away if: You have a fever or chills. You suddenly cannot urinate. You feel lightheaded, or very dizzy, or you faint. There are large amounts of blood or clots in the urine. Your urinary problems become hard to manage. You develop moderate to severe low back or flank pain. The flank is the side of your body between the ribs and the hip. These symptoms may represent a serious problem that is an emergency. Do not wait to see if the symptoms will go away. Get medical help right away. Call your local emergency services (911 in the [place] Do not drive yourself to the hospital. Summary Benign prostatic hyperplasia (BPH) is an enlarged prostate that is caused by the normal aging process and not by cancer. An enlarged prostate can press on the urethra. This can make it hard to pass urine. This  condition is part of a normal aging process and is more likely to develop in men over the age of 50 years. Get help right away if you suddenly cannot urinate. This information is not intended to replace advice given to you by your health care provider. Make sure you discuss any questions you have with your health care provider. Document Revised: 09/12/2020 Document Reviewed: 02/09/2020 Elsevier Patient Education  2022 [employer] Inc.  

## 2021-04-03 NOTE — Progress Notes (Signed)
Subjective:    Patient ID: Lonnie Hurley, male    DOB: 1941-06-04, 80 y.o.   MRN: [id number]  Chief Complaint  Patient presents with   Medical Management of Chronic Issues   Pt presents to the office today to chronic follow up. He is followed by Urologists every 6 months for BPH. Followed by Cardiologists annually.  He is followed by Dermatologists every 6 months. He is followed by Ortho for osteoarthritis for bilateral knee pain and getting steroid injections.  Hypertension This is a chronic problem. The current episode started more than 1 year ago. The problem has been resolved since onset. The problem is controlled. Associated symptoms include malaise/fatigue. Pertinent negatives include no blurred vision, peripheral edema or shortness of breath. Risk factors for coronary artery disease include dyslipidemia, diabetes mellitus, obesity, male gender and sedentary lifestyle. The current treatment provides moderate improvement.  Diabetes He presents for his follow-up diabetic visit. He has type 2 diabetes mellitus. Associated symptoms include foot paresthesias. Pertinent negatives for diabetes include no blurred vision. Symptoms are stable. Diabetic complications include heart disease. Risk factors for coronary artery disease include dyslipidemia, diabetes mellitus, male sex, hypertension and sedentary lifestyle. He is following a generally unhealthy diet. His overall blood glucose range is 130-140 mg/dl.  Arthritis Presents for follow-up visit. He complains of pain and stiffness. The symptoms have been stable. Affected locations include the left knee and right knee. His pain is at a severity of 5/10.  Benign Prostatic Hypertrophy This is a chronic problem. The current episode started more than 1 year ago. Irritative symptoms include nocturia and urgency. Obstructive symptoms include incomplete emptying, straining and a weak stream.  Hyperlipidemia This is a chronic problem. The current episode  started more than 1 year ago. Exacerbating diseases include obesity. Pertinent negatives include no shortness of breath. Current antihyperlipidemic treatment includes statins. The current treatment provides moderate improvement of lipids. Risk factors for coronary artery disease include dyslipidemia, diabetes mellitus, male sex, hypertension and a sedentary lifestyle.     Review of Systems  Constitutional:  Positive for malaise/fatigue.  Eyes:  Negative for blurred vision.  Respiratory:  Negative for shortness of breath.   Genitourinary:  Positive for incomplete emptying, nocturia and urgency.  Musculoskeletal:  Positive for arthritis and stiffness.  All other systems reviewed and are negative.     Objective:   Physical Exam Vitals reviewed.  Constitutional:      General: He is not in acute distress.    Appearance: He is well-developed. He is obese.  HENT:     Head: Normocephalic.     Right Ear: Tympanic membrane normal.     Left Ear: Tympanic membrane normal.  Eyes:     General:        Right eye: No discharge.        Left eye: No discharge.     Pupils: Pupils are equal, round, and reactive to light.  Neck:     Thyroid: No thyromegaly.  Cardiovascular:     Rate and Rhythm: Normal rate and regular rhythm.     Heart sounds: Murmur heard.  Pulmonary:     Effort: Pulmonary effort is normal. No respiratory distress.     Breath sounds: Normal breath sounds. No wheezing.  Abdominal:     General: Bowel sounds are normal. There is no distension.     Palpations: Abdomen is soft.     Tenderness: There is no abdominal tenderness.  Musculoskeletal:        General:  No tenderness. Normal range of motion.     Cervical back: Normal range of motion and neck supple.     Right lower leg: Edema (trace) present.     Left lower leg: Edema (trace) present.  Skin:    General: Skin is warm and dry.     Findings: No erythema or rash.  Neurological:     Mental Status: He is alert and oriented to  person, place, and time.     Cranial Nerves: No cranial nerve deficit.     Deep Tendon Reflexes: Reflexes are normal and symmetric.  Psychiatric:        Behavior: Behavior normal.        Thought Content: Thought content normal.        Judgment: Judgment normal.      BP 109/62   Pulse 77   Temp 98.1 F (36.7 C) (Temporal)   Resp 20   Ht 5' 6"  (1.676 m)   Wt 244 lb (110.7 kg)   SpO2 94%   BMI 39.38 kg/m      Assessment & Plan:  Lonnie Hurley comes in today with chief complaint of Medical Management of Chronic Issues   Diagnosis and orders addressed:  1. Primary hypertension Stop HCTZ 12.5 mg - CMP14+EGFR - CBC with Differential/Platelet  2. Type 2 diabetes mellitus with other specified complication, without long-term current use of insulin (HCC) - Bayer DCA Hb A1c Waived - CMP14+EGFR - CBC with Differential/Platelet  3. Hereditary and idiopathic peripheral neuropathy - CMP14+EGFR - CBC with Differential/Platelet  4. Benign prostatic hyperplasia with lower urinary tract symptoms, symptom details unspecified - CMP14+EGFR - CBC with Differential/Platelet  5. Allergy to alpha-gal - CMP14+EGFR - CBC with Differential/Platelet  6. Mixed hyperlipidemia - CMP14+EGFR - CBC with Differential/Platelet  7. Vitamin B 12 deficiency - CMP14+EGFR - CBC with Differential/Platelet   Labs pending Health Maintenance reviewed Diet and exercise encouraged  Follow up plan: 4 months    [name], FNP

## 2021-04-04 ENCOUNTER — Ambulatory Visit (INDEPENDENT_AMBULATORY_CARE_PROVIDER_SITE_OTHER): Payer: PPO | Admitting: *Deleted

## 2021-04-04 DIAGNOSIS — E538 Deficiency of other specified B group vitamins: Secondary | ICD-10-CM | POA: Diagnosis not present

## 2021-04-04 LAB — CMP14+EGFR
ALT: 27 IU/L (ref 0–44)
AST: 29 IU/L (ref 0–40)
Albumin/Globulin Ratio: 1.5 (ref 1.2–2.2)
Albumin: 3.9 g/dL (ref 3.7–4.7)
Alkaline Phosphatase: 56 IU/L (ref 44–121)
BUN/Creatinine Ratio: 16 (ref 10–24)
BUN: 16 mg/dL (ref 8–27)
Bilirubin Total: 0.5 mg/dL (ref 0.0–1.2)
CO2: 27 mmol/L (ref 20–29)
Calcium: 9 mg/dL (ref 8.6–10.2)
Chloride: 100 mmol/L (ref 96–106)
Creatinine, Ser: 0.99 mg/dL (ref 0.76–1.27)
Globulin, Total: 2.6 g/dL (ref 1.5–4.5)
Glucose: 160 mg/dL — ABNORMAL HIGH (ref 70–99)
Potassium: 3.7 mmol/L (ref 3.5–5.2)
Sodium: 140 mmol/L (ref 134–144)
Total Protein: 6.5 g/dL (ref 6.0–8.5)
eGFR: 78 mL/min/{1.73_m2} (ref >59)

## 2021-04-04 LAB — CBC WITH DIFFERENTIAL/PLATELET
Basophils Absolute: 0.1 10*3/uL (ref 0.0–0.2)
Basos: 1 %
EOS (ABSOLUTE): 0.3 10*3/uL (ref 0.0–0.4)
Eos: 3 %
Hematocrit: 42.6 % (ref 37.5–51.0)
Hemoglobin: 14.5 g/dL (ref 13.0–17.7)
Immature Grans (Abs): 0.1 10*3/uL (ref 0.0–0.1)
Immature Granulocytes: 1 %
Lymphocytes Absolute: 3 10*3/uL (ref 0.7–3.1)
Lymphs: 31 %
MCH: 29.7 pg (ref 26.6–33.0)
MCHC: 34 g/dL (ref 31.5–35.7)
MCV: 87 fL (ref 79–97)
Monocytes Absolute: 0.8 10*3/uL (ref 0.1–0.9)
Monocytes: 8 %
Neutrophils Absolute: 5.7 10*3/uL (ref 1.4–7.0)
Neutrophils: 56 %
Platelets: 167 10*3/uL (ref 150–450)
RBC: 4.88 x10E6/uL (ref 4.14–5.80)
RDW: 12.3 % (ref 11.6–15.4)
WBC: 9.9 10*3/uL (ref 3.4–10.8)

## 2021-04-04 NOTE — Progress Notes (Signed)
Vitamin b12 injection given and patient tolerated well.  

## 2021-04-10 ENCOUNTER — Telehealth: Payer: Self-pay | Admitting: Family

## 2021-04-10 DIAGNOSIS — E785 Hyperlipidemia, unspecified: Secondary | ICD-10-CM

## 2021-04-10 DIAGNOSIS — Z91018 Allergy to other foods: Secondary | ICD-10-CM

## 2021-04-10 DIAGNOSIS — M79605 Pain in left leg: Secondary | ICD-10-CM

## 2021-04-10 DIAGNOSIS — I10 Essential (primary) hypertension: Secondary | ICD-10-CM

## 2021-04-10 DIAGNOSIS — L509 Urticaria, unspecified: Secondary | ICD-10-CM

## 2021-04-10 MED ORDER — SIMVASTATIN 40 MG PO TABS: 40.0000 mg | ORAL_TABLET | Freq: Every day | ORAL | 1 refills | Status: DC

## 2021-04-10 MED ORDER — EZETIMIBE 10 MG PO TABS: 10.0000 mg | ORAL_TABLET | Freq: Every day | ORAL | 1 refills | Status: DC

## 2021-04-10 MED ORDER — RAMIPRIL 10 MG PO CAPS: 10.0000 mg | ORAL_CAPSULE | Freq: Every day | ORAL | 1 refills | Status: DC

## 2021-04-10 MED ORDER — HYDROXYZINE PAMOATE 25 MG PO CAPS: 25.0000 mg | ORAL_CAPSULE | Freq: Three times a day (TID) | ORAL | 2 refills | Status: DC | PRN

## 2021-04-10 MED ORDER — GABAPENTIN 100 MG PO CAPS: ORAL_CAPSULE | ORAL | 1 refills | Status: DC

## 2021-04-10 NOTE — Telephone Encounter (Signed)
  Prescription Request  04/10/2021  What is the name of the medication or equipment? ALL except Tamsulosin  Have you contacted your pharmacy to request a refill? Yes  Which pharmacy would you like this sent to?  [facility]  Pt had appt with Chinchilla on 04/03/21. Was told by pharmacy that he was either out or almost out of refills on his meds. Wants Christy to go ahead and send in his refills.

## 2021-04-10 NOTE — Telephone Encounter (Signed)
Medications sent  Attempted to contact patient - NA

## 2021-04-11 ENCOUNTER — Encounter: Payer: Self-pay | Admitting: *Deleted

## 2021-04-11 ENCOUNTER — Ambulatory Visit (INDEPENDENT_AMBULATORY_CARE_PROVIDER_SITE_OTHER): Payer: PPO | Admitting: *Deleted

## 2021-04-11 DIAGNOSIS — I10 Essential (primary) hypertension: Secondary | ICD-10-CM

## 2021-04-11 DIAGNOSIS — E1169 Type 2 diabetes mellitus with other specified complication: Secondary | ICD-10-CM

## 2021-04-14 NOTE — Patient Instructions (Signed)
Visit Information  Patient Goals/Self-Care Activities: Patient will self administer medications as prescribed as evidenced by self report/primary caregiver report  Patient will attend all scheduled provider appointments as evidenced by clinician review of documented attendance to scheduled appointments and patient/caregiver report Patient will call pharmacy for medication refills as evidenced by patient report and review of pharmacy fill history as appropriate Patient will continue to perform ADL's independently as evidenced by patient/caregiver report Patient will continue to perform IADL's independently as evidenced by patient/caregiver report Patient will call provider office for new concerns or questions as evidenced by review of documented incoming telephone call notes and patient report - check feet daily for cuts, sores or redness - enter blood sugar readings and medication or insulin into daily log - take the blood sugar log to all doctor visits - take the blood sugar meter to all doctor visits - check blood pressure daily - write blood pressure results in a log or diary - keep a blood pressure log - take blood pressure log to all doctor appointments - begin an exercise program Follow a low sodium/DASH diet  The patient verbalized understanding of instructions, educational materials, and care plan provided today and declined offer to receive copy of patient instructions, educational materials, and care plan.   Plan:Telephone follow up appointment with care management team member scheduled for:  05/13/21 with RNCM The patient has been provided with contact information for the care management team and has been advised to call with any health related questions or concerns.   Chong Sicilian, BSN, RN-BC Embedded Chronic Care Manager [facility] Direct Dial: [phone]

## 2021-04-14 NOTE — Chronic Care Management (AMB) (Signed)
Chronic Care Management   CCM RN Visit Note  04/11/2021 Name: Lonnie Hurley MRN: [id number] DOB: 08-Aug-1940  Subjective: Lonnie Hurley is a 80 y.o. year old male who is a primary care patient of Sharion Balloon, FNP. The care management team was consulted for assistance with disease management and care coordination needs.    Engaged with patient by telephone for follow up visit in response to provider referral for case management and/or care coordination services.   Consent to Services:  The patient was given information about Chronic Care Management services, agreed to services, and gave verbal consent prior to initiation of services.  Please see initial visit note for detailed documentation.   Patient agreed to services and verbal consent obtained.   Assessment: Review of patient past medical history, allergies, medications, health status, including review of consultants reports, laboratory and other test data, was performed as part of comprehensive evaluation and provision of chronic care management services.   SDOH (Social Determinants of Health) assessments and interventions performed:    CCM Care Plan  Allergies  Allergen Reactions   Penicillins Other (See Comments)    unknown   Aspirin Other (See Comments)    Causes Blood in urine   Crestor [Rosuvastatin Calcium] Other (See Comments)    Myalgia, "joint problem"   Gabapentin Other (See Comments)    Blurry vision Pt able to take in small doses    Hydrocodone Other (See Comments)    Constipation     Lyrica [Pregabalin]     Blurry vision     Outpatient Encounter Medications as of 04/11/2021  Medication Sig   acetaminophen (TYLENOL) 500 MG tablet Take 500 mg by mouth every 6 (six) hours as needed.   Calcium Carb-Cholecalciferol (CALCIUM 600 + D PO) Take by mouth 2 (two) times daily.    diclofenac Sodium (VOLTAREN) 1 % GEL Apply 2 g topically 4 (four) times daily.   EPINEPHrine 0.3 mg/0.3 mL IJ SOAJ injection Inject  0.3 mg into the muscle as needed for anaphylaxis.   ezetimibe (ZETIA) 10 MG tablet Take 1 tablet (10 mg total) by mouth daily.   gabapentin (NEURONTIN) 100 MG capsule TAKE 1 CAPSULE BY MOUTH THREE TIMES A DAY   Glucosamine-Chondroit-Vit C-Mn (GLUCOSAMINE 1500 COMPLEX PO) Take by mouth 2 (two) times daily.    glucose blood (ONE TOUCH ULTRA TEST) test strip Use as instructed   hydrOXYzine (VISTARIL) 25 MG capsule Take 1 capsule (25 mg total) by mouth 3 (three) times daily as needed.   meloxicam (MOBIC) 7.5 MG tablet Take 1 tablet (7.5 mg total) by mouth daily.   Multiple Vitamins-Minerals (MULTIVITAMIN WITH MINERALS) tablet Take 1 tablet by mouth daily.   polyethylene glycol powder (MIRALAX) 17 GM/SCOOP powder Take 17 g by mouth as needed for moderate constipation.   ramipril (ALTACE) 10 MG capsule Take 1 capsule (10 mg total) by mouth daily.   simvastatin (ZOCOR) 40 MG tablet Take 1 tablet (40 mg total) by mouth at bedtime.   tamsulosin (FLOMAX) 0.4 MG CAPS capsule Take 1 capsule (0.4 mg total) by mouth at bedtime.   Vitamin D, Ergocalciferol, (DRISDOL) 1.25 MG (50000 UNIT) CAPS capsule Take 1 capsule (50,000 Units total) by mouth every 7 (seven) days.   Facility-Administered Encounter Medications as of 04/11/2021  Medication   cyanocobalamin ((VITAMIN B-12)) injection 1,000 mcg    Patient Active Problem List   Diagnosis Date Noted   Allergy to alpha-gal 01/04/2021   Haglund's deformity of right heel 06/27/2019   RMSF (  [facility] spotted fever) 07/02/2018   Chronic pain of right knee 04/14/2018   Heel pain, chronic, right 03/19/2018   Foot pain, bilateral 03/19/2018   Kidney stone 03/19/2018   Arthritis of back 08/25/2016   AAA (abdominal aortic aneurysm) without rupture 07/13/2016   Lumbar spondylosis with myelopathy 07/13/2016   Diabetes mellitus ([place]) 16/03/9603   Metabolic syndrome 54/02/8118   Vitamin B 12 deficiency 12/14/2014   Hereditary and idiopathic peripheral  neuropathy 10/17/2014   Vitamin D deficiency 06/29/2014   BPH (benign prostatic hyperplasia) 06/29/2014   Hypertension 11/17/2012   Hyperlipidemia 11/17/2012   DDD (degenerative disc disease), lumbar 11/17/2012   Thrombocytopenia ([place])     Conditions to be addressed/monitored:HTN and DMII  Care Plan : [facility] Care Plan  Updates made by Ilean China, RN since 04/14/2021 12:00 AM     Problem: Chronic Disease Management Needs   Priority: High  Onset Date: 04/11/2021     Long-Range Goal: Work with RN Care Manager Regarding Care Management and [place] with DM and HTN   Start Date: 04/11/2021  Expected End Date: 04/11/2022  This Visit's Progress: On track  Priority: High  Note:   Current Barriers:  Chronic Disease Management support and education needs related to HTN and DMII  RNCM Clinical Goal(s):  Patient will continue to work with RN Care Manager and/or Social Worker to address care management and care coordination needs related to HTN and DMII as evidenced by adherence to CM Team Scheduled appointments     through collaboration with Consulting civil engineer, provider, and care team.   Interventions: 1:1 collaboration with primary care provider regarding development and update of comprehensive plan of care as evidenced by provider attestation and co-signature Inter-disciplinary care team collaboration (see longitudinal plan of care) Evaluation of current treatment plan related to  self management and patient's adherence to plan as established by provider Discussed family/social support Primary support person for brother that lives with him   Diabetes:  (Status: Goal on Track (progressing): YES.) Long Term Goal   Lab Results  Component Value Date   HGBA1C 6.7 (H) 04/03/2021  Assessed patient's understanding of A1c goal: <7% Provided education to patient about basic DM disease process; Reviewed medications with patient and discussed importance of medication  adherence;        Counseled on importance of regular laboratory monitoring as prescribed;        Discussed plans with patient for ongoing care management follow up and provided patient with direct contact information for care management team;      Advised patient, providing education and rationale, to check cbg daily and record      Provided verbal education on dietary recommendations   Review of patient status, including review of consultants reports, relevant laboratory and other test results, and medications completed;       Assessed social determinant of health barriers;         Hypertension: (Status: Goal on Track (progressing): YES.) Last practice recorded BP readings:  BP Readings from Last 3 Encounters:  04/03/21 109/62  01/04/21 121/77  12/12/20 102/68  Most recent eGFR/CrCl:  Lab Results  Component Value Date   EGFR 78 04/03/2021    No components found for: CRCL  Evaluation of current treatment plan related to hypertension self management and patient's adherence to plan as established by provider;   Reviewed prescribed diet low sodium/DASH diet Reviewed medications with patient and discussed importance of compliance;  Counseled on the importance of exercise  goals with target of 150 minutes per week Discussed plans with patient for ongoing care management follow up and provided patient with direct contact information for care management team; Reviewed scheduled/upcoming provider appointments including:  Discussed complications of poorly controlled blood pressure such as heart disease, stroke, circulatory complications, vision complications, kidney impairment, sexual dysfunction;  Assessed social determinant of health barriers;   Patient Goals/Self-Care Activities: Patient will self administer medications as prescribed as evidenced by self report/primary caregiver report  Patient will attend all scheduled provider appointments as evidenced by clinician review of documented  attendance to scheduled appointments and patient/caregiver report Patient will call pharmacy for medication refills as evidenced by patient report and review of pharmacy fill history as appropriate Patient will continue to perform ADL's independently as evidenced by patient/caregiver report Patient will continue to perform IADL's independently as evidenced by patient/caregiver report Patient will call provider office for new concerns or questions as evidenced by review of documented incoming telephone call notes and patient report - check feet daily for cuts, sores or redness - enter blood sugar readings and medication or insulin into daily log - take the blood sugar log to all doctor visits - take the blood sugar meter to all doctor visits - check blood pressure daily - write blood pressure results in a log or diary - keep a blood pressure log - take blood pressure log to all doctor appointments - begin an exercise program Follow a low sodium/DASH diet   Plan:Telephone follow up appointment with care management team member scheduled for:  05/13/21 with RNCM The patient has been provided with contact information for the care management team and has been advised to call with any health related questions or concerns.   Chong Sicilian, BSN, RN-BC Embedded Chronic Care Manager [facility] [place] [facility] Direct Dial: [phone]

## 2021-04-15 DIAGNOSIS — I10 Essential (primary) hypertension: Secondary | ICD-10-CM

## 2021-04-15 DIAGNOSIS — E1169 Type 2 diabetes mellitus with other specified complication: Secondary | ICD-10-CM | POA: Diagnosis not present

## 2021-04-19 ENCOUNTER — Ambulatory Visit (INDEPENDENT_AMBULATORY_CARE_PROVIDER_SITE_OTHER): Payer: PPO | Admitting: Licensed Clinical Social Worker

## 2021-04-19 DIAGNOSIS — M5136 Other intervertebral disc degeneration, lumbar region: Secondary | ICD-10-CM

## 2021-04-19 DIAGNOSIS — E785 Hyperlipidemia, unspecified: Secondary | ICD-10-CM

## 2021-04-19 DIAGNOSIS — I10 Essential (primary) hypertension: Secondary | ICD-10-CM

## 2021-04-19 DIAGNOSIS — M51369 Other intervertebral disc degeneration, lumbar region without mention of lumbar back pain or lower extremity pain: Secondary | ICD-10-CM

## 2021-04-19 DIAGNOSIS — E1169 Type 2 diabetes mellitus with other specified complication: Secondary | ICD-10-CM

## 2021-04-19 DIAGNOSIS — M479 Spondylosis, unspecified: Secondary | ICD-10-CM

## 2021-04-19 NOTE — Patient Instructions (Addendum)
Visit Information  Patient Goals:  Manage Emotions. Complete ADLs as able. Manage mobility needs  Timeframe:  Short-Term Goal Priority:  Medium Progress: On Track Start Date:          04/19/21                 Expected End Date:         07/18/21               Follow Up Date  06/19/21 at 2:00 PM   Manage Emotions: Complete ADLs as able ;Manage mobility needs   Why is this important?   When you are stressed, down or upset, your body reacts too.  For example, your blood pressure may get higher; you may have a headache or stomachache.  When your emotions get the best of you, your body's ability to fight off cold and flu gets weak.  These steps will help you manage your emotions.     Patient Coping Skills: Attends scheduled medical appointments Takes medications as prescribed Has some support from his brother, Sparsh Callens  Patient Deficits:  Mobility issues May have some challenges in completing ADls  Patient Goals: In next 30 days,  Patient will attend scheduled medical appointments Will take medications as prescribed Will call RNCM or LCSW as needed for CCM support  Follow Up Plan: LCSW to call client or sister of client on 06/19/21 at 2:00 PM to assess needs of client at that time   [name].Amya Hlad MSW, LCSW Licensed Clinical Social Worker [facility] Care Management [phone]

## 2021-04-19 NOTE — Chronic Care Management (AMB) (Signed)
Chronic Care Management    Clinical Social Work Note  04/19/2021 Name: Lonnie Hurley MRN: [id number] DOB: 1941/05/13  Lonnie Hurley is a 80 y.o. year old male who is a primary care patient of Sharion Balloon, FNP. The CCM team was consulted to assist the patient with chronic disease management and/or care coordination needs related to: [name] .   Engaged with patient / sister of patient, [name], by telephone for follow up visit in response to provider referral for social work chronic care management and care coordination services.   Consent to Services:  The patient was given information about Chronic Care Management services, agreed to services, and gave verbal consent prior to initiation of services.  Please see initial visit note for detailed documentation.   Patient agreed to services and consent obtained.   Assessment: Review of patient past medical history, allergies, medications, and health status, including review of relevant consultants reports was performed today as part of a comprehensive evaluation and provision of chronic care management and care coordination services.     SDOH (Social Determinants of Health) assessments and interventions performed:  SDOH Interventions    Flowsheet Row Most Recent Value  SDOH Interventions   Physical Activity Interventions Other (Comments)  [client has knee pain when walking. client has occasional back pain when walking]  Stress Interventions Other (Comment)  [client has stress related to managing his medical needs,  client has stress related to the health needs of his brother]  Depression Interventions/Treatment  --  [informed [name], sister of client, of LCSW support for client and of RNCM support for client]        Advanced Directives Status: See [employer] application for related entries.  CCM Care Plan  Allergies  Allergen Reactions   Penicillins Other (See Comments)    unknown   Aspirin Other (See  Comments)    Causes Blood in urine   Crestor [Rosuvastatin Calcium] Other (See Comments)    Myalgia, "joint problem"   Gabapentin Other (See Comments)    Blurry vision Pt able to take in small doses    Hydrocodone Other (See Comments)    Constipation     Lyrica [Pregabalin]     Blurry vision     Outpatient Encounter Medications as of 04/19/2021  Medication Sig   acetaminophen (TYLENOL) 500 MG tablet Take 500 mg by mouth every 6 (six) hours as needed.   Calcium Carb-Cholecalciferol (CALCIUM 600 + D PO) Take by mouth 2 (two) times daily.    diclofenac Sodium (VOLTAREN) 1 % GEL Apply 2 g topically 4 (four) times daily.   EPINEPHrine 0.3 mg/0.3 mL IJ SOAJ injection Inject 0.3 mg into the muscle as needed for anaphylaxis.   ezetimibe (ZETIA) 10 MG tablet Take 1 tablet (10 mg total) by mouth daily.   gabapentin (NEURONTIN) 100 MG capsule TAKE 1 CAPSULE BY MOUTH THREE TIMES A DAY   Glucosamine-Chondroit-Vit C-Mn (GLUCOSAMINE 1500 COMPLEX PO) Take by mouth 2 (two) times daily.    glucose blood (ONE TOUCH ULTRA TEST) test strip Use as instructed   hydrOXYzine (VISTARIL) 25 MG capsule Take 1 capsule (25 mg total) by mouth 3 (three) times daily as needed.   meloxicam (MOBIC) 7.5 MG tablet Take 1 tablet (7.5 mg total) by mouth daily.   Multiple Vitamins-Minerals (MULTIVITAMIN WITH MINERALS) tablet Take 1 tablet by mouth daily.   polyethylene glycol powder (MIRALAX) 17 GM/SCOOP powder Take 17 g by mouth as needed for moderate constipation.   ramipril (ALTACE) 10  MG capsule Take 1 capsule (10 mg total) by mouth daily.   simvastatin (ZOCOR) 40 MG tablet Take 1 tablet (40 mg total) by mouth at bedtime.   tamsulosin (FLOMAX) 0.4 MG CAPS capsule Take 1 capsule (0.4 mg total) by mouth at bedtime.   Vitamin D, Ergocalciferol, (DRISDOL) 1.25 MG (50000 UNIT) CAPS capsule Take 1 capsule (50,000 Units total) by mouth every 7 (seven) days.   Facility-Administered Encounter Medications as of 04/19/2021   Medication   cyanocobalamin ((VITAMIN B-12)) injection 1,000 mcg    Patient Active Problem List   Diagnosis Date Noted   Allergy to alpha-gal 01/04/2021   Haglund's deformity of right heel 06/27/2019   [facility] spotted fever) 07/02/2018   Chronic pain of right knee 04/14/2018   Heel pain, chronic, right 03/19/2018   Foot pain, bilateral 03/19/2018   Kidney stone 03/19/2018   Arthritis of back 08/25/2016   AAA (abdominal aortic aneurysm) without rupture 07/13/2016   Lumbar spondylosis with myelopathy 07/13/2016   Diabetes mellitus ([place]) 44/08/4740   Metabolic syndrome 59/56/3875   Vitamin B 12 deficiency 12/14/2014   Hereditary and idiopathic peripheral neuropathy 10/17/2014   Vitamin D deficiency 06/29/2014   BPH (benign prostatic hyperplasia) 06/29/2014   Hypertension 11/17/2012   Hyperlipidemia 11/17/2012   DDD (degenerative disc disease), lumbar 11/17/2012   Thrombocytopenia ([place])     Conditions to be addressed/monitored: monitor client completion of ADLs  Care Plan : LCSW care plan  Updates made by Katha Cabal, LCSW since 04/19/2021 12:00 AM     Problem: Functional Decline      Goal: Mobility and Function Maintained;Complete ADLs daily as able   Start Date: 04/19/2021  Expected End Date: 07/18/2021  This Visit's Progress: On track  Recent Progress: On track  Priority: Medium  Note:   Current barriers:   Patient in need of assistance with connecting to community resources for possible help in completing his daily ADLs and other daily activities Patient is unable to independently navigate community resource options without care coordination support Mobility issues Social isolation issues Pain issues  Clinical Goals:  patient will work with SW monthly to address concerns related to ADLs completion of client and mobility challenges of client Patient will call LCSW in next 30 days to discuss social isolation issues of client   Clinical  Interventions:  Collaboration with Sharion Balloon, FNP regarding development and update of comprehensive plan of care as evidenced by provider attestation and co-signature Assessment of needs, barriers of client  Discussed with [name], sister of client, the current pain issues of client. Webb Silversmith said that client has knee pain issues and has back pain issues.  Discussed medication procurement for client Reviewed sleeping issues of client with Webb Silversmith. Reviewed ADLs completion for client. She said client has a walk in tub and is doing well with ADLs completion.  Discussed client mobility with Webb Silversmith.  Reviewed mood status of client with Webb Silversmith. She said client sometimes is a little sad because he cannot be as active as he used to be. She said he enjoys working outdoors in [employer]. He enjoys picking apples and giving them to neighbors and friends. He enjoys outside activities and this helps him with managing his mood Reviewed CCM support services for client with [name]  Patient Coping Skills/Strengths: Completes ADLs daily as able Takes medications as prescribed Attends scheduled medical appointments  Patient Deficits  Some mobility challenges Reduced social contact with others  Patient Goals:   Patient will  talk with LCSW in next 30 days about mobility of client and about client completion of daily ADLs Patient will socialize with others or talk with others 2-3 times weekly in next 30 days Patient will attend scheduled medical appointments in next 30 days -  Follow Up Plan: LCSW to call client or Webb Silversmith, sister of client, on 06/19/21 at 2:00 PM to assess client needs.      [name].Dianne Whelchel MSW, LCSW Licensed Clinical Social Worker [facility] Care Management [phone]

## 2021-04-22 ENCOUNTER — Other Ambulatory Visit: Payer: Self-pay | Admitting: Family

## 2021-05-06 ENCOUNTER — Ambulatory Visit (INDEPENDENT_AMBULATORY_CARE_PROVIDER_SITE_OTHER): Payer: PPO | Admitting: *Deleted

## 2021-05-06 ENCOUNTER — Other Ambulatory Visit: Payer: Self-pay

## 2021-05-06 DIAGNOSIS — E538 Deficiency of other specified B group vitamins: Secondary | ICD-10-CM | POA: Diagnosis not present

## 2021-05-07 IMAGING — DX DG SHOULDER 2+V*L*
3 series · 4 of 4 positions shown · non-contrast
Comparison: Head and ankle

CLINICAL DATA: Shoulder pain

EXAM:
LEFT SHOULDER - 2+ VIEW

[shoulder ap]
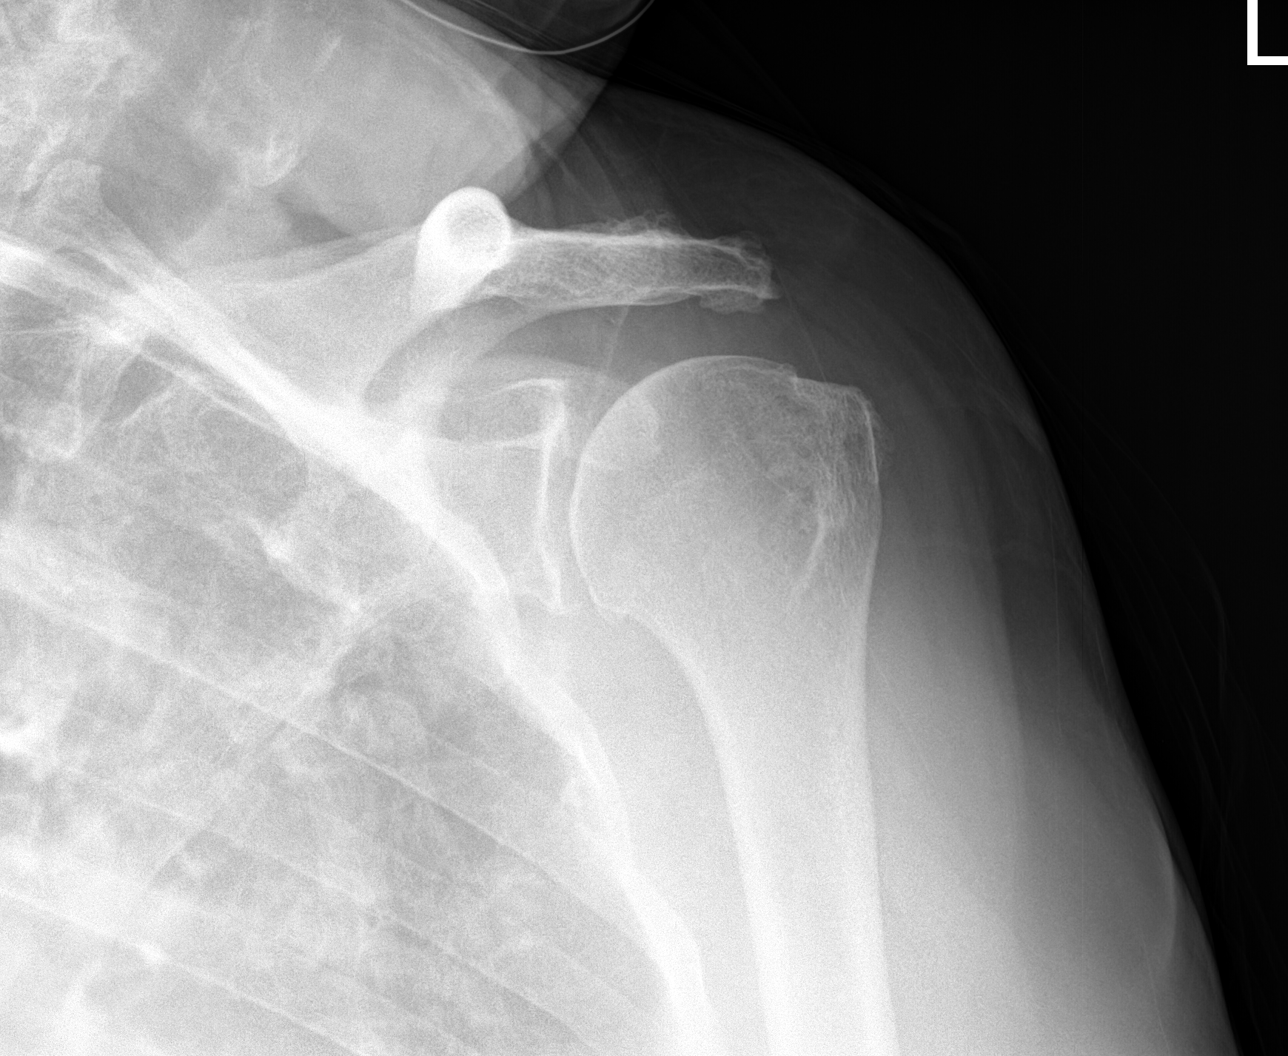

[shoulder obl]
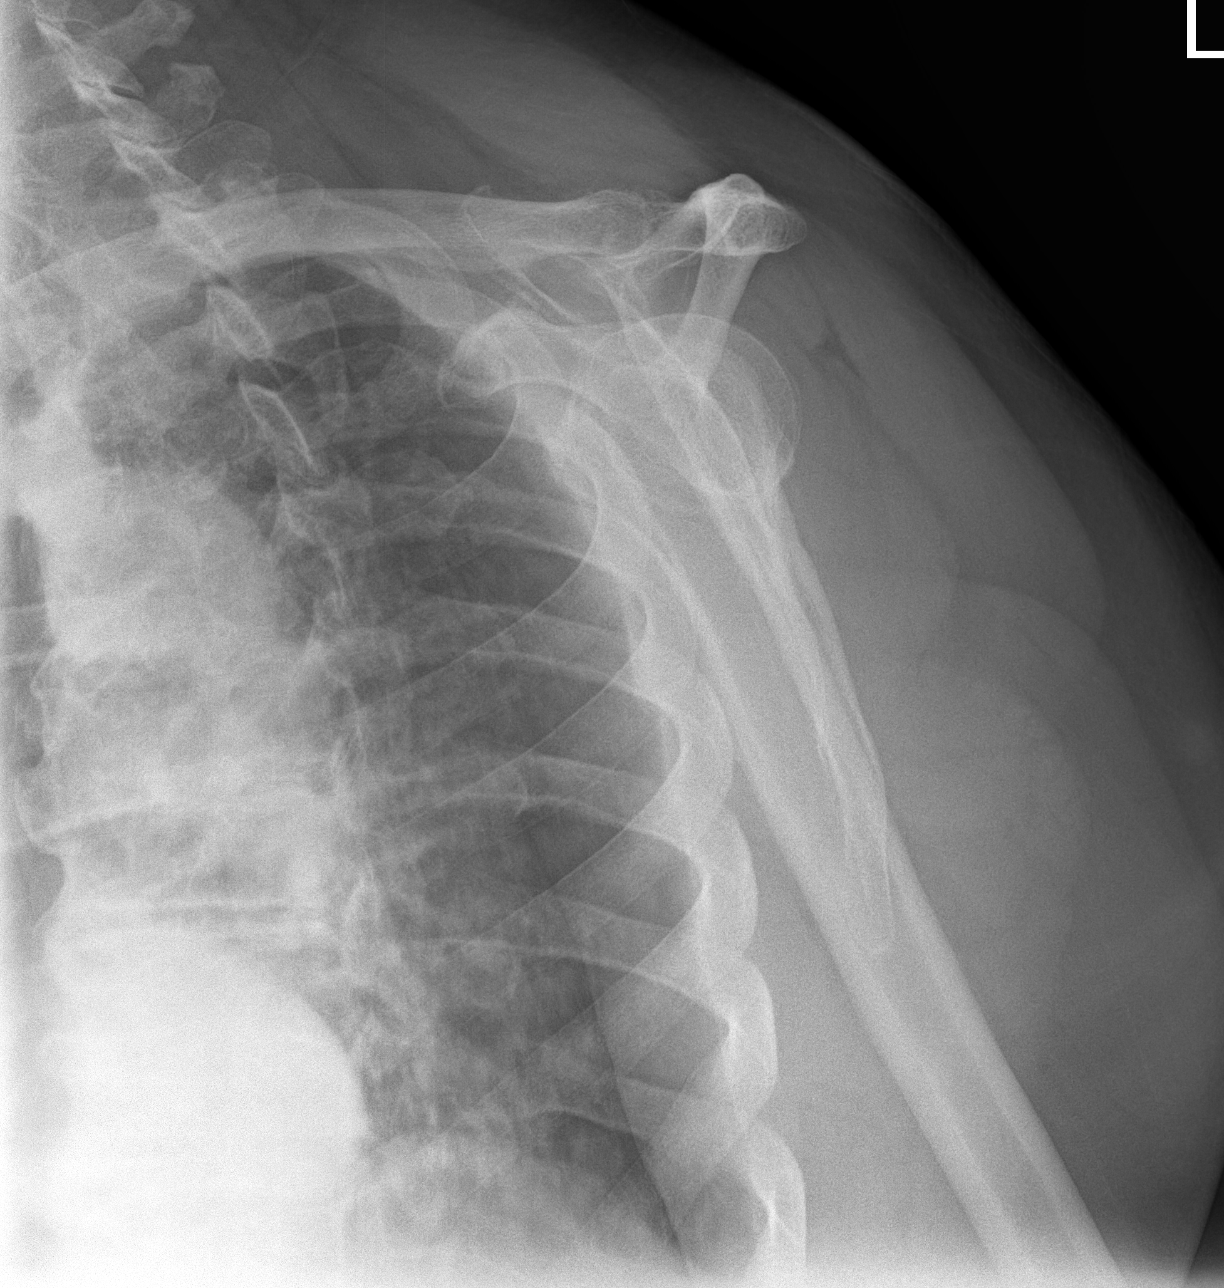

[Series 3: shoulder axial · 0.14mm/px · 2 of 2 slices shown]
[im 1/2]
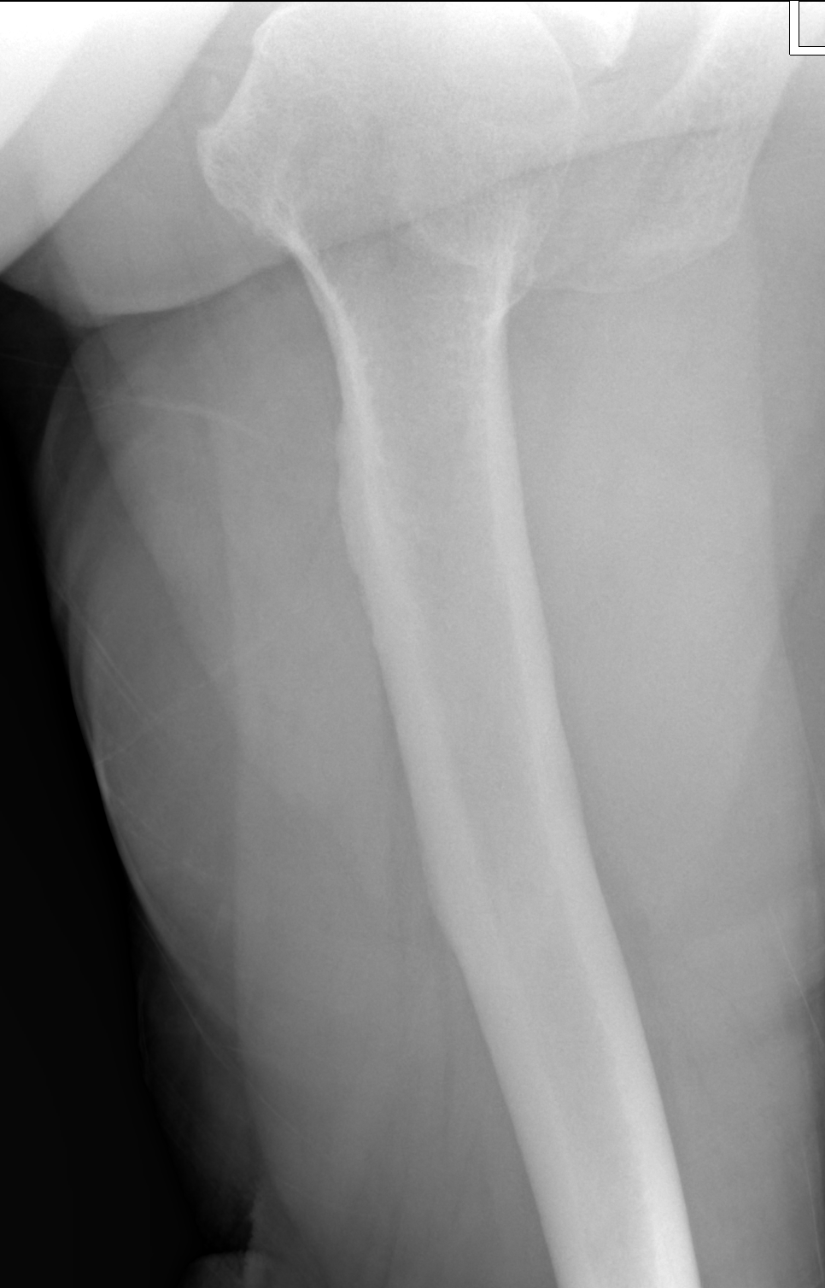
[im 2/2]
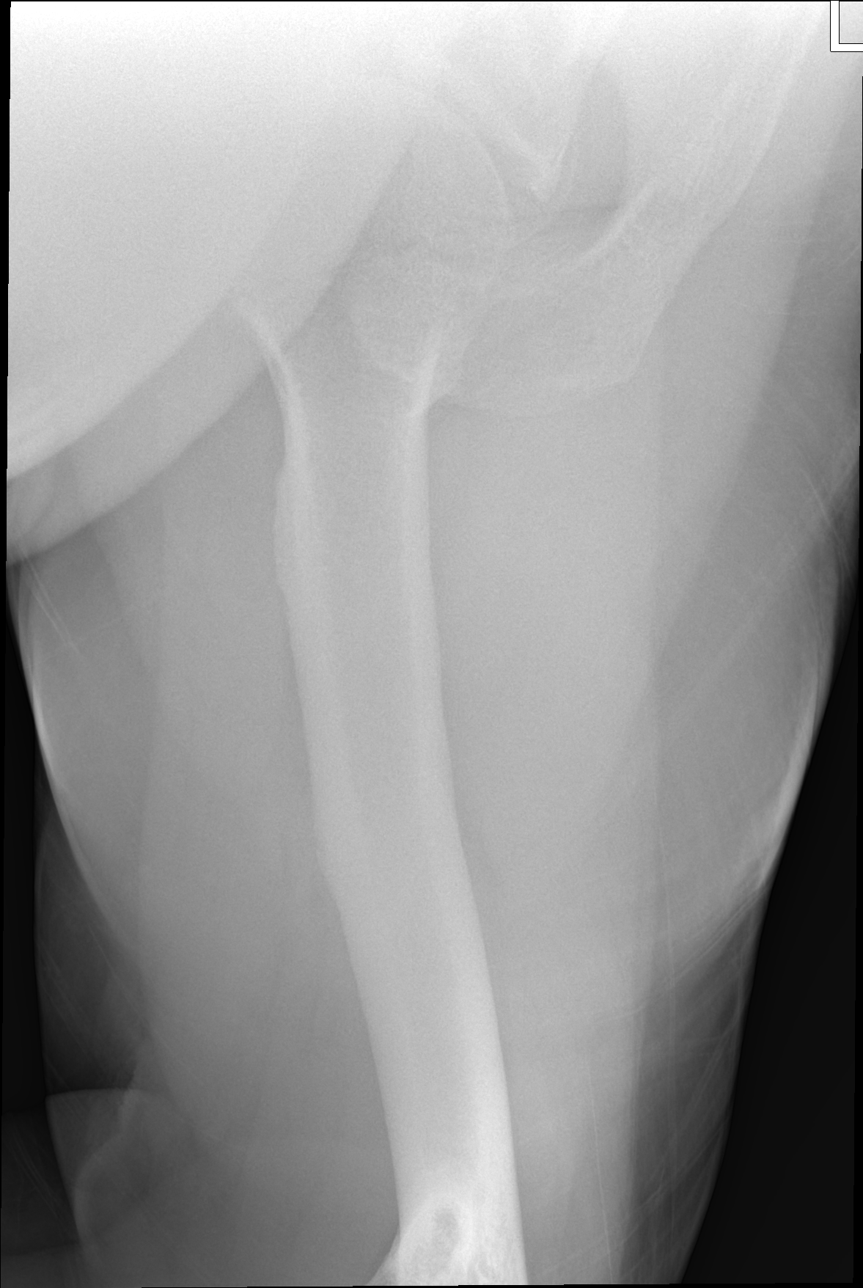

[4 of 4 positions shown; findings below may reference images not displayed]

FINDINGS: Alignment is anatomic. No acute fracture. Joint spaces are
preserved.
IMPRESSION: Negative.

## 2021-05-13 ENCOUNTER — Telehealth: Payer: PPO

## 2021-05-13 ENCOUNTER — Telehealth: Payer: Self-pay

## 2021-05-13 NOTE — Chronic Care Management (AMB) (Signed)
  Care Management   Note  05/13/2021 Name: Lonnie Hurley MRN: [id number] DOB: 02-Nov-1940  Lonnie Hurley is a 80 y.o. year old male who is a primary care patient of Sharion Balloon, FNP and is actively engaged with the care management team. I reached out to [name] by phone today to assist with re-scheduling a follow up visit with the RN Case Manager  Follow up plan: Unsuccessful telephone outreach attempt made. A HIPAA compliant phone message was left for the patient providing contact information and requesting a return call.  The care management team will reach out to the patient again over the next 7 days.  If patient returns call to provider office, please advise to call [facility]  at [place], [place], [place], [place] [address] Direct Dial: [phone] [email or web address]

## 2021-05-13 NOTE — Chronic Care Management (AMB) (Signed)
  Care Management   Note  05/13/2021 Name: Lonnie Hurley MRN: [id number] DOB: June 01, 1941  Lonnie Hurley is a 80 y.o. year old male who is a primary care patient of Sharion Balloon, FNP and is actively engaged with the care management team. I reached out to [name] by phone today to assist with re-scheduling a follow up visit with the RN Case Manager  Follow up plan: Telephone appointment with care management team member scheduled for:05/29/2021  Noreene Larsson, [place], [place], [place] [address] Direct Dial: [phone] [email or web address]

## 2021-05-15 ENCOUNTER — Ambulatory Visit (INDEPENDENT_AMBULATORY_CARE_PROVIDER_SITE_OTHER): Payer: PPO | Admitting: Nurse Practitioner

## 2021-05-15 ENCOUNTER — Encounter: Payer: Self-pay | Admitting: Nurse Practitioner

## 2021-05-15 VITALS — BP 133/79 | HR 58 | Temp 97.2°F | Resp 20 | Ht 66.0 in | Wt 248.0 lb

## 2021-05-15 DIAGNOSIS — M479 Spondylosis, unspecified: Secondary | ICD-10-CM

## 2021-05-15 DIAGNOSIS — E1169 Type 2 diabetes mellitus with other specified complication: Secondary | ICD-10-CM

## 2021-05-15 DIAGNOSIS — E785 Hyperlipidemia, unspecified: Secondary | ICD-10-CM

## 2021-05-15 DIAGNOSIS — M79651 Pain in right thigh: Secondary | ICD-10-CM | POA: Insufficient documentation

## 2021-05-15 DIAGNOSIS — M79652 Pain in left thigh: Secondary | ICD-10-CM

## 2021-05-15 DIAGNOSIS — I10 Essential (primary) hypertension: Secondary | ICD-10-CM

## 2021-05-15 MED ORDER — PREDNISONE 10 MG (21) PO TBPK: ORAL_TABLET | ORAL | 0 refills | Status: DC

## 2021-05-15 MED ORDER — ACETAMINOPHEN 500 MG PO TABS: 500.0000 mg | ORAL_TABLET | Freq: Four times a day (QID) | ORAL | 0 refills | Status: AC | PRN

## 2021-05-15 NOTE — Progress Notes (Signed)
Acute Office Visit  Subjective:    Patient ID: Lonnie Hurley, male    DOB: March 09, 1941, 80 y.o.   MRN: [id number]  Chief Complaint  Patient presents with   Leg Pain     Leg Pain  The incident occurred 3 to 5 days ago. The incident occurred at home. There was no injury mechanism. The pain is present in the left thigh and right thigh. The quality of the pain is described as aching. The pain is moderate. The pain has been Intermittent since onset. He reports no foreign bodies present. Nothing aggravates the symptoms. He has tried nothing for the symptoms.   Past Medical History:  Diagnosis Date   BPH (benign prostatic hypertrophy)    Dr. Rosana Hoes / Dr. Joelyn Oms  - Urologist    Cancer [facility])    lip-mole surgery   Cataract    bilateral   Colon polyps    Constipation    occasional - miralax prn   DDD (degenerative disc disease)    Hearing loss    left ear, no hearing aids   Hyperlipidemia    Hypertension    Leukoplakia    Microscopic hematuria    negative work up with Urology in the past.    OAB (overactive bladder)    with past percutaneous tibial nerve stimulation therapy   Thrombocytopenia ([facility])    Tinnitus    Tobacco abuse    Vitamin B 12 deficiency    Vitamin D deficiency     Past Surgical History:  Procedure Laterality Date   CATARACT EXTRACTION  2007   right eye   CATARACT EXTRACTION Left 03/21/15   COLONOSCOPY  02/28/2014   [facility]   KNEE SURGERY  02-28-2008   replaced inside right knee   Left Knee Repair  11-10-2006   NOSE SURGERY     POLYPECTOMY     colon polyps   SKIN BIOPSY  10-08-2006   left ear    Family History  Problem Relation Age of Onset   Heart failure Father    Glaucoma Mother    Osteoporosis Brother    Heart disease Brother 80       stent   Hypertension Brother    Hyperlipidemia Brother    Diabetes Brother    Colon cancer Neg Hx    Stomach cancer Neg Hx    Rectal cancer Neg Hx     Social History   Socioeconomic History    Marital status: Single    Spouse name: Not on file   Number of children: 0   Years of education: 10   Highest education level: 10th grade  Occupational History   Occupation: Retired    Comment: retired Immunologist  Tobacco Use   Smoking status: Former    Packs/day: 1.00    Years: 50.00    Pack years: 50.00    Types: Cigarettes    Quit date: 08/14/2009    Years since quitting: 11.7   Smokeless tobacco: Never  Vaping Use   Vaping Use: Never used  Substance and Sexual Activity   Alcohol use: No   Drug use: No   Sexual activity: Not Currently  Other Topics Concern   Not on file  Social History Narrative   Retired Immunologist   Lives at home with Brother, Engineer, petroleum   Social Determinants of Health   Financial Resource Strain: Low Risk    Difficulty of Paying Living Expenses: Not hard at all  Food  Insecurity: No Food Insecurity   Worried About Charity fundraiser in the Last Year: Never true   Ran Out of Food in the Last Year: Never true  Transportation Needs: No Transportation Needs   Lack of Transportation (Medical): No   Lack of Transportation (Non-Medical): No  Physical Activity: Insufficiently Active   Days of Exercise per Week: 2 days   Minutes of Exercise per Session: 10 min  Stress: Stress Concern Present   Feeling of Stress : To some extent  Social Connections: Moderately Isolated   Frequency of Communication with Friends and Family: More than three times a week   Frequency of Social Gatherings with Friends and Family: More than three times a week   Attends Religious Services: More than 4 times per year   Active Member of [employer] or Organizations: No   Attends Archivist Meetings: Never   Marital Status: Never married  Human resources officer Violence: Not At Risk   Fear of Current or Ex-Partner: No   Emotionally Abused: No   Physically Abused: No   Sexually Abused: No    Outpatient Medications Prior to Visit  Medication Sig  Dispense Refill   Calcium Carb-Cholecalciferol (CALCIUM 600 + D PO) Take by mouth 2 (two) times daily.      diclofenac Sodium (VOLTAREN) 1 % GEL Apply 2 g topically 4 (four) times daily. 350 g 1   EPINEPHrine 0.3 mg/0.3 mL IJ SOAJ injection Inject 0.3 mg into the muscle as needed for anaphylaxis. 1 each 2   ezetimibe (ZETIA) 10 MG tablet Take 1 tablet (10 mg total) by mouth daily. 90 tablet 1   gabapentin (NEURONTIN) 100 MG capsule TAKE 1 CAPSULE BY MOUTH THREE TIMES A DAY 270 capsule 1   Glucosamine-Chondroit-Vit C-Mn (GLUCOSAMINE 1500 COMPLEX PO) Take by mouth 2 (two) times daily.      glucose blood (ONETOUCH ULTRA) test strip Test BS daily Dx E11.9 100 strip 3   hydrOXYzine (VISTARIL) 25 MG capsule Take 1 capsule (25 mg total) by mouth 3 (three) times daily as needed. 60 capsule 2   meloxicam (MOBIC) 7.5 MG tablet Take 1 tablet (7.5 mg total) by mouth daily. 20 tablet 1   Multiple Vitamins-Minerals (MULTIVITAMIN WITH MINERALS) tablet Take 1 tablet by mouth daily.     polyethylene glycol powder (MIRALAX) 17 GM/SCOOP powder Take 17 g by mouth as needed for moderate constipation. 1700 g 2   ramipril (ALTACE) 10 MG capsule Take 1 capsule (10 mg total) by mouth daily. 90 capsule 1   simvastatin (ZOCOR) 40 MG tablet Take 1 tablet (40 mg total) by mouth at bedtime. 90 tablet 1   tamsulosin (FLOMAX) 0.4 MG CAPS capsule Take 1 capsule (0.4 mg total) by mouth at bedtime. 90 capsule 3   Vitamin D, Ergocalciferol, (DRISDOL) 1.25 MG (50000 UNIT) CAPS capsule Take 1 capsule (50,000 Units total) by mouth every 7 (seven) days. 12 capsule 3   acetaminophen (TYLENOL) 500 MG tablet Take 500 mg by mouth every 6 (six) hours as needed.     Facility-Administered Medications Prior to Visit  Medication Dose Route Frequency Provider Last Rate Last Admin   cyanocobalamin ((VITAMIN B-12)) injection 1,000 mcg  1,000 mcg Intramuscular Q30 days [name] A, FNP   1,000 mcg at 05/06/21 8850    Allergies  Allergen  Reactions   Penicillins Other (See Comments)    unknown   Aspirin Other (See Comments)    Causes Blood in urine   Crestor [Rosuvastatin Calcium] Other (  See Comments)    Myalgia, "joint problem"   Gabapentin Other (See Comments)    Blurry vision Pt able to take in small doses    Hydrocodone Other (See Comments)    Constipation     Lyrica [Pregabalin]     Blurry vision     Review of Systems  Constitutional: Negative.   HENT: Negative.    Eyes: Negative.   Respiratory: Negative.    Cardiovascular: Negative.   Genitourinary: Negative.   Musculoskeletal:  Positive for myalgias.  Skin: Negative.  Negative for rash.  All other systems reviewed and are negative.     Objective:    Physical Exam Vitals and nursing note reviewed.  Constitutional:      Appearance: He is obese.  HENT:     Head: Normocephalic.     Right Ear: External ear normal.     Left Ear: External ear normal.     Mouth/Throat:     Mouth: Mucous membranes are moist.  Eyes:     Conjunctiva/sclera: Conjunctivae normal.  Cardiovascular:     Rate and Rhythm: Normal rate.     Pulses: Normal pulses.     Heart sounds: Normal heart sounds.  Pulmonary:     Effort: Pulmonary effort is normal.     Breath sounds: Normal breath sounds.  Abdominal:     General: Bowel sounds are normal.  Musculoskeletal:     Right upper leg: Tenderness present.     Left upper leg: Tenderness present.       Legs:     Comments: Swelling/pain inner upper thigh  Skin:    General: Skin is warm.     Findings: No rash.  Neurological:     Mental Status: He is alert and oriented to person, place, and time.    BP 133/79   Pulse (!) 58   Temp (!) 97.2 F (36.2 C) (Temporal)   Resp 20   Ht $R'5\' 6"'Ec$  (1.676 m)   Wt 248 lb (112.5 kg)   SpO2 97%   BMI 40.03 kg/m  Wt Readings from Last 3 Encounters:  05/15/21 248 lb (112.5 kg)  04/03/21 244 lb (110.7 kg)  01/04/21 244 lb 12.8 oz (111 kg)    Health Maintenance Due  Topic Date Due    COVID-19 Vaccine (5 - Booster for Pfizer series) 12/10/2020   FOOT EXAM  04/19/2021    There are no preventive care reminders to display for this patient.   Lab Results  Component Value Date   TSH 1.920 07/14/2019   Lab Results  Component Value Date   WBC 9.9 04/03/2021   HGB 14.5 04/03/2021   HCT 42.6 04/03/2021   MCV 87 04/03/2021   PLT 167 04/03/2021   Lab Results  Component Value Date   NA 140 04/03/2021   K 3.7 04/03/2021   CHLORIDE 105 09/01/2013   CO2 27 04/03/2021   GLUCOSE 160 (H) 04/03/2021   BUN 16 04/03/2021   CREATININE 0.99 04/03/2021   BILITOT 0.5 04/03/2021   ALKPHOS 56 04/03/2021   AST 29 04/03/2021   ALT 27 04/03/2021   PROT 6.5 04/03/2021   ALBUMIN 3.9 04/03/2021   CALCIUM 9.0 04/03/2021   ANIONGAP 9 09/01/2013   EGFR 78 04/03/2021   Lab Results  Component Value Date   CHOL 113 10/17/2020   Lab Results  Component Value Date   HDL 34 (L) 10/17/2020   Lab Results  Component Value Date   LDLCALC 52 10/17/2020   Lab Results  Component Value Date   TRIG 157 (H) 10/17/2020   Lab Results  Component Value Date   CHOLHDL 3.3 10/17/2020   Lab Results  Component Value Date   HGBA1C 6.7 (H) 04/03/2021       Assessment & Plan:   Problem List Items Addressed This Visit       Other   Pain in both thighs - Primary    Patient is reporting bilateral inner thigh pain.  Patient has a history of sweat gland removal in the past and bilateral thigh and did fine until a few days ago when eyes begin to hurt with mild swelling.  I started patient on prednisone pack, Tylenol Extra Strength, ice/heating pad as tolerated.  Follow-up with unresolved symptoms.      Relevant Medications   predniSONE (STERAPRED UNI-PAK 21 TAB) 10 MG (21) TBPK tablet   acetaminophen (TYLENOL) 500 MG tablet     Meds ordered this encounter  Medications   predniSONE (STERAPRED UNI-PAK 21 TAB) 10 MG (21) TBPK tablet    Sig: 6 tablets day 1, 5 tablet day 2,  to 4  tablet day 3, 3 tablets day 4, 2 tablet for 5, 1 tablet day 6    Dispense:  1 each    Refill:  0    Order Specific Question:   Supervising Provider    Answer:   Jeneen Rinks   acetaminophen (TYLENOL) 500 MG tablet    Sig: Take 1 tablet (500 mg total) by mouth every 6 (six) hours as needed.    Dispense:  30 tablet    Refill:  0    Order Specific Question:   Supervising Provider    Answer:   Claretta Fraise [[id number]]      Ivy Lynn, NP

## 2021-05-15 NOTE — Assessment & Plan Note (Signed)
Patient is reporting bilateral inner thigh pain.  Patient has a history of sweat gland removal in the past and bilateral thigh and did fine until a few days ago when eyes begin to hurt with mild swelling.  I started patient on prednisone pack, Tylenol Extra Strength, ice/heating pad as tolerated.  Follow-up with unresolved symptoms.

## 2021-05-22 DIAGNOSIS — H02132 Senile ectropion of right lower eyelid: Secondary | ICD-10-CM | POA: Diagnosis not present

## 2021-05-22 DIAGNOSIS — H43812 Vitreous degeneration, left eye: Secondary | ICD-10-CM | POA: Diagnosis not present

## 2021-05-22 DIAGNOSIS — E119 Type 2 diabetes mellitus without complications: Secondary | ICD-10-CM | POA: Diagnosis not present

## 2021-05-22 DIAGNOSIS — H16223 Keratoconjunctivitis sicca, not specified as Sjogren's, bilateral: Secondary | ICD-10-CM | POA: Diagnosis not present

## 2021-05-22 DIAGNOSIS — Z961 Presence of intraocular lens: Secondary | ICD-10-CM | POA: Diagnosis not present

## 2021-05-22 DIAGNOSIS — H26493 Other secondary cataract, bilateral: Secondary | ICD-10-CM | POA: Diagnosis not present

## 2021-05-22 LAB — HM DIABETES EYE EXAM

## 2021-05-28 DIAGNOSIS — N200 Calculus of kidney: Secondary | ICD-10-CM | POA: Diagnosis not present

## 2021-05-28 DIAGNOSIS — R3121 Asymptomatic microscopic hematuria: Secondary | ICD-10-CM | POA: Diagnosis not present

## 2021-05-28 DIAGNOSIS — N4883 Acquired buried penis: Secondary | ICD-10-CM | POA: Diagnosis not present

## 2021-05-28 DIAGNOSIS — N3281 Overactive bladder: Secondary | ICD-10-CM | POA: Diagnosis not present

## 2021-05-29 ENCOUNTER — Telehealth: Payer: PPO

## 2021-06-06 ENCOUNTER — Other Ambulatory Visit: Payer: Self-pay

## 2021-06-06 ENCOUNTER — Telehealth: Payer: Self-pay | Admitting: Family

## 2021-06-06 ENCOUNTER — Ambulatory Visit (INDEPENDENT_AMBULATORY_CARE_PROVIDER_SITE_OTHER): Payer: PPO

## 2021-06-06 DIAGNOSIS — E538 Deficiency of other specified B group vitamins: Secondary | ICD-10-CM

## 2021-06-06 DIAGNOSIS — M25512 Pain in left shoulder: Secondary | ICD-10-CM

## 2021-06-06 DIAGNOSIS — M19012 Primary osteoarthritis, left shoulder: Secondary | ICD-10-CM

## 2021-06-06 MED ORDER — DICLOFENAC SODIUM 1 % EX GEL: 2.0000 g | Freq: Four times a day (QID) | CUTANEOUS | 2 refills | Status: DC

## 2021-06-06 NOTE — Telephone Encounter (Signed)
Pt aware refill sent to pharmacy, done in another encounter.

## 2021-06-06 NOTE — Telephone Encounter (Signed)
°  Prescription Request  06/06/2021  Is this a "Controlled Substance" medicine? no  Have you seen your PCP in the last 2 weeks? no  If YES, route message to pool  -  If NO, patient needs to be scheduled for appointment.  What is the name of the medication or equipment? Diclofenac Sodium 1% Gel  Have you contacted your pharmacy to request a refill? no   Which pharmacy would you like this sent to? [facility]   Patient notified that their request is being sent to the clinical staff for review and that they should receive a response within 2 business days.    Lonnie Hurley' pt.

## 2021-06-06 NOTE — Progress Notes (Signed)
Cyanocobalamin injection given to right deltoid.  Patient tolerated well. 

## 2021-06-19 ENCOUNTER — Ambulatory Visit (INDEPENDENT_AMBULATORY_CARE_PROVIDER_SITE_OTHER): Payer: PPO | Admitting: Licensed Clinical Social Worker

## 2021-06-19 DIAGNOSIS — M479 Spondylosis, unspecified: Secondary | ICD-10-CM

## 2021-06-19 DIAGNOSIS — E785 Hyperlipidemia, unspecified: Secondary | ICD-10-CM

## 2021-06-19 DIAGNOSIS — M5136 Other intervertebral disc degeneration, lumbar region: Secondary | ICD-10-CM

## 2021-06-19 DIAGNOSIS — E1169 Type 2 diabetes mellitus with other specified complication: Secondary | ICD-10-CM

## 2021-06-19 DIAGNOSIS — M51369 Other intervertebral disc degeneration, lumbar region without mention of lumbar back pain or lower extremity pain: Secondary | ICD-10-CM

## 2021-06-19 DIAGNOSIS — I10 Essential (primary) hypertension: Secondary | ICD-10-CM

## 2021-06-19 NOTE — Chronic Care Management (AMB) (Signed)
Chronic Care Management    Clinical Social Work Note  06/19/2021 Name: Lonnie Hurley MRN: [id number] DOB: 1940/12/01  Lonnie Hurley is a 81 y.o. year old male who is a primary care patient of Sharion Balloon, FNP. The CCM team was consulted to assist the patient with chronic disease management and/or care coordination needs related to: [name] .   Engaged with patient by telephone for follow up visit in response to provider referral for social work chronic care management and care coordination services.   Consent to Services:  The patient was given information about Chronic Care Management services, agreed to services, and gave verbal consent prior to initiation of services.  Please see initial visit note for detailed documentation.   Patient agreed to services and consent obtained.   Assessment: Review of patient past medical history, allergies, medications, and health status, including review of relevant consultants reports was performed today as part of a comprehensive evaluation and provision of chronic care management and care coordination services.     SDOH (Social Determinants of Health) assessments and interventions performed:  SDOH Interventions    Flowsheet Row Most Recent Value  SDOH Interventions   Physical Activity Interventions Other (Comments)  [client has some walking challenges. he uses a cane to help him walk]  Stress Interventions Provide Counseling  [client has stress related to managing his health needs. client has decreased energy]  Depression Interventions/Treatment  --  [informed client of LCSW support and of RNCM support]        Advanced Directives Status: See Vynca application for related entries.  CCM Care Plan  Allergies  Allergen Reactions   Penicillins Other (See Comments)    unknown   Aspirin Other (See Comments)    Causes Blood in urine   Crestor [Rosuvastatin Calcium] Other (See Comments)    Myalgia, "joint problem"   Gabapentin  Other (See Comments)    Blurry vision Pt able to take in small doses    Hydrocodone Other (See Comments)    Constipation     Lyrica [Pregabalin]     Blurry vision     Outpatient Encounter Medications as of 06/19/2021  Medication Sig   acetaminophen (TYLENOL) 500 MG tablet Take 1 tablet (500 mg total) by mouth every 6 (six) hours as needed.   Calcium Carb-Cholecalciferol (CALCIUM 600 + D PO) Take by mouth 2 (two) times daily.    diclofenac Sodium (VOLTAREN) 1 % GEL Apply 2 g topically 4 (four) times daily.   EPINEPHrine 0.3 mg/0.3 mL IJ SOAJ injection Inject 0.3 mg into the muscle as needed for anaphylaxis.   ezetimibe (ZETIA) 10 MG tablet Take 1 tablet (10 mg total) by mouth daily.   gabapentin (NEURONTIN) 100 MG capsule TAKE 1 CAPSULE BY MOUTH THREE TIMES A DAY   Glucosamine-Chondroit-Vit C-Mn (GLUCOSAMINE 1500 COMPLEX PO) Take by mouth 2 (two) times daily.    glucose blood (ONETOUCH ULTRA) test strip Test BS daily Dx E11.9   hydrOXYzine (VISTARIL) 25 MG capsule Take 1 capsule (25 mg total) by mouth 3 (three) times daily as needed.   meloxicam (MOBIC) 7.5 MG tablet Take 1 tablet (7.5 mg total) by mouth daily.   Multiple Vitamins-Minerals (MULTIVITAMIN WITH MINERALS) tablet Take 1 tablet by mouth daily.   polyethylene glycol powder (MIRALAX) 17 GM/SCOOP powder Take 17 g by mouth as needed for moderate constipation.   predniSONE (STERAPRED UNI-PAK 21 TAB) 10 MG (21) TBPK tablet 6 tablets day 1, 5 tablet day 2,  to 4 tablet  day 3, 3 tablets day 4, 2 tablet for 5, 1 tablet day 6   ramipril (ALTACE) 10 MG capsule Take 1 capsule (10 mg total) by mouth daily.   simvastatin (ZOCOR) 40 MG tablet Take 1 tablet (40 mg total) by mouth at bedtime.   tamsulosin (FLOMAX) 0.4 MG CAPS capsule Take 1 capsule (0.4 mg total) by mouth at bedtime.   Vitamin D, Ergocalciferol, (DRISDOL) 1.25 MG (50000 UNIT) CAPS capsule Take 1 capsule (50,000 Units total) by mouth every 7 (seven) days.   Facility-Administered  Encounter Medications as of 06/19/2021  Medication   cyanocobalamin ((VITAMIN B-12)) injection 1,000 mcg    Patient Active Problem List   Diagnosis Date Noted   Pain in both thighs 05/15/2021   Allergy to alpha-gal 01/04/2021   Haglund's deformity of right heel 06/27/2019   [facility] spotted fever) 07/02/2018   Chronic pain of right knee 04/14/2018   Heel pain, chronic, right 03/19/2018   Foot pain, bilateral 03/19/2018   Kidney stone 03/19/2018   Arthritis of back 08/25/2016   AAA (abdominal aortic aneurysm) without rupture 07/13/2016   Lumbar spondylosis with myelopathy 07/13/2016   Diabetes mellitus ([place]) 12/10/9483   Metabolic syndrome 46/27/0350   Vitamin B 12 deficiency 12/14/2014   Hereditary and idiopathic peripheral neuropathy 10/17/2014   Vitamin D deficiency 06/29/2014   BPH (benign prostatic hyperplasia) 06/29/2014   Hypertension 11/17/2012   Hyperlipidemia 11/17/2012   DDD (degenerative disc disease), lumbar 11/17/2012   Thrombocytopenia ([place])     Conditions to be addressed/monitored: monitor client completion of ADLs; monitor client mobility needs  Care Plan : LCSW care plan  Updates made by Katha Cabal, LCSW since 06/19/2021 12:00 AM     Problem: Functional Decline      Goal: Mobility and Function Maintained;Complete ADLs daily as able   Start Date: 06/19/2021  Expected End Date: 09/16/2021  This Visit's Progress: On track  Recent Progress: On track  Priority: Medium  Note:   Current barriers:   Patient in need of assistance with connecting to community resources for possible help in completing his daily ADLs and other daily activities Patient is unable to independently navigate community resource options without care coordination support Mobility issues Social isolation issues Pain issues  Clinical Goals:  patient will work with SW monthly to address concerns related to ADLs completion of client and mobility challenges of client Patient  will call LCSW in next 30 days to discuss social isolation issues of client  Patient will call RNCM in next 30 days as needed to discuss nursing needs of client  Clinical Interventions:  Collaboration with Sharion Balloon, FNP regarding development and update of comprehensive plan of care as evidenced by provider attestation and co-signature Discussed with [name], the current needs of client. Discussed with Kadan the pain issues of client. Astor said he has pain issues in his thighs and he said he has pain issues in his knees. Discussed medication procurement for client Reviewed sleeping issues of client.  Luciano said he was sleeping well at present Reviewed ADLs completion of client. Rutger said he is completing daily ADLs.  Discussed client mobility. Erron said he has some pain issues in his knees. He said he uses a cane to help him walk. Reviewed mood status of client . Spike said he thought his mood was stable at present. He said he may get a little sad occasionally. But, overall, he said his mood was doing well. He said that he enjoys  cooking meals with his brother, Alveta Heimlich.  They cook meals together and share meals together.  Client has daily social interaction with his brother, Cassiel Fernandez. Encouraged client to call RNCM as needed for CCM nursing support Discussed ramp for client. LCSW encouraged client to call or visit [facility] in [place], [place] to discuss portable ramp through [employer].  Client said he would contact [facility] to discuss ramp ideas. He is not sure if he wants a portable, aluminum ramp for his home or wants to have a wooden ramp constructed for his home.  Provided counseling support for client.  Client said he uses a sock aide to put on socks He said he has no problem putting on his shoes.  He does have a cane but did not mention any other equipment items.  Patient Coping Skills/Strengths: Completes ADLs daily as able Takes  medications as prescribed Attends scheduled medical appointments  Patient Deficits  Some mobility challenges Reduced social contact with others  Patient Goals:   Patient will talk with LCSW in next 30 days about mobility of client and about client completion of daily ADLs Patient will socialize with others or talk with others 2-3 times weekly in next 30 days Patient will attend scheduled medical appointments in next 30 days -  Follow Up Plan: LCSW to call client or Alveta Heimlich, brother of client on 08/13/21 at 11:30 AM to assess client needs.      [name].Mardee Clune MSW, LCSW Licensed Clinical Social Worker [facility] Care Management [phone]

## 2021-06-19 NOTE — Patient Instructions (Addendum)
Visit Information  Patient Goals:  Manage Emotions. Complete ADLs as able. Manage mobility needs  Timeframe:  Short-Term Goal Priority:  Medium Progress: On Track Start Date:         06/19/21             Expected End Date:         09/16/21               Follow Up Date  08/13/21 at 11:30 AM   Manage Emotions: Complete ADLs as able ;Manage mobility needs   Why is this important?   When you are stressed, down or upset, your body reacts too.  For example, your blood pressure may get higher; you may have a headache or stomachache.  When your emotions get the best of you, your body's ability to fight off cold and flu gets weak.  These steps will help you manage your emotions.     Patient Coping Skills: Attends scheduled medical appointments Takes medications as prescribed Has some support from his brother, Lonnie Hurley  Patient Deficits:  Mobility issues May have some challenges in completing ADls  Patient Goals: In next 30 days,  Patient will attend scheduled medical appointments Will take medications as prescribed Will call RNCM or LCSW as needed for CCM support  Follow Up Plan: LCSW to call client or Lonnie Hurley, brother of client, on 08/13/21 at 11:30 AM to assess needs of client at that time   [name].Lonnie Hurley MSW, LCSW Licensed Clinical Social Worker [facility] Care Management [phone]

## 2021-06-25 ENCOUNTER — Telehealth: Payer: PPO

## 2021-07-08 ENCOUNTER — Ambulatory Visit (INDEPENDENT_AMBULATORY_CARE_PROVIDER_SITE_OTHER): Payer: PPO

## 2021-07-08 DIAGNOSIS — E538 Deficiency of other specified B group vitamins: Secondary | ICD-10-CM

## 2021-07-08 DIAGNOSIS — I10 Essential (primary) hypertension: Secondary | ICD-10-CM

## 2021-07-08 MED ORDER — RAMIPRIL 10 MG PO CAPS: 10.0000 mg | ORAL_CAPSULE | Freq: Every day | ORAL | 0 refills | Status: DC

## 2021-07-08 NOTE — Progress Notes (Signed)
Cyanocobalamin injection given to left deltoid.  Patient tolerated well. 

## 2021-07-16 DIAGNOSIS — E1169 Type 2 diabetes mellitus with other specified complication: Secondary | ICD-10-CM

## 2021-07-16 DIAGNOSIS — E785 Hyperlipidemia, unspecified: Secondary | ICD-10-CM

## 2021-07-16 DIAGNOSIS — M479 Spondylosis, unspecified: Secondary | ICD-10-CM

## 2021-07-16 DIAGNOSIS — I10 Essential (primary) hypertension: Secondary | ICD-10-CM

## 2021-07-17 DIAGNOSIS — Z6839 Body mass index (BMI) 39.0-39.9, adult: Secondary | ICD-10-CM | POA: Diagnosis not present

## 2021-07-17 DIAGNOSIS — I10 Essential (primary) hypertension: Secondary | ICD-10-CM | POA: Diagnosis not present

## 2021-07-17 DIAGNOSIS — M545 Low back pain, unspecified: Secondary | ICD-10-CM | POA: Diagnosis not present

## 2021-07-19 ENCOUNTER — Telehealth: Payer: Self-pay | Admitting: Family

## 2021-07-19 DIAGNOSIS — I10 Essential (primary) hypertension: Secondary | ICD-10-CM

## 2021-07-19 MED ORDER — RAMIPRIL 10 MG PO CAPS: 10.0000 mg | ORAL_CAPSULE | Freq: Every day | ORAL | 0 refills | Status: DC

## 2021-07-19 NOTE — Telephone Encounter (Signed)
Ten day supply of Ramipril sent to [facility], patient informed.

## 2021-07-29 DIAGNOSIS — R531 Weakness: Secondary | ICD-10-CM | POA: Diagnosis not present

## 2021-07-29 DIAGNOSIS — R262 Difficulty in walking, not elsewhere classified: Secondary | ICD-10-CM | POA: Diagnosis not present

## 2021-07-29 DIAGNOSIS — R2689 Other abnormalities of gait and mobility: Secondary | ICD-10-CM | POA: Diagnosis not present

## 2021-07-29 DIAGNOSIS — M545 Low back pain, unspecified: Secondary | ICD-10-CM | POA: Diagnosis not present

## 2021-08-05 DIAGNOSIS — R531 Weakness: Secondary | ICD-10-CM | POA: Diagnosis not present

## 2021-08-05 DIAGNOSIS — R262 Difficulty in walking, not elsewhere classified: Secondary | ICD-10-CM | POA: Diagnosis not present

## 2021-08-05 DIAGNOSIS — R2689 Other abnormalities of gait and mobility: Secondary | ICD-10-CM | POA: Diagnosis not present

## 2021-08-05 DIAGNOSIS — M545 Low back pain, unspecified: Secondary | ICD-10-CM | POA: Diagnosis not present

## 2021-08-06 ENCOUNTER — Encounter: Payer: Self-pay | Admitting: Family

## 2021-08-06 ENCOUNTER — Ambulatory Visit (INDEPENDENT_AMBULATORY_CARE_PROVIDER_SITE_OTHER): Payer: PPO | Admitting: Family

## 2021-08-06 VITALS — BP 127/71 | HR 78 | Temp 97.1°F | Ht 66.0 in | Wt 246.0 lb

## 2021-08-06 DIAGNOSIS — M25561 Pain in right knee: Secondary | ICD-10-CM | POA: Diagnosis not present

## 2021-08-06 DIAGNOSIS — I700 Atherosclerosis of aorta: Secondary | ICD-10-CM | POA: Diagnosis not present

## 2021-08-06 DIAGNOSIS — M79604 Pain in right leg: Secondary | ICD-10-CM

## 2021-08-06 DIAGNOSIS — M479 Spondylosis, unspecified: Secondary | ICD-10-CM

## 2021-08-06 DIAGNOSIS — I10 Essential (primary) hypertension: Secondary | ICD-10-CM | POA: Diagnosis not present

## 2021-08-06 DIAGNOSIS — E1169 Type 2 diabetes mellitus with other specified complication: Secondary | ICD-10-CM

## 2021-08-06 DIAGNOSIS — L509 Urticaria, unspecified: Secondary | ICD-10-CM | POA: Diagnosis not present

## 2021-08-06 DIAGNOSIS — E785 Hyperlipidemia, unspecified: Secondary | ICD-10-CM | POA: Diagnosis not present

## 2021-08-06 DIAGNOSIS — M79605 Pain in left leg: Secondary | ICD-10-CM

## 2021-08-06 DIAGNOSIS — N401 Enlarged prostate with lower urinary tract symptoms: Secondary | ICD-10-CM | POA: Diagnosis not present

## 2021-08-06 DIAGNOSIS — G8929 Other chronic pain: Secondary | ICD-10-CM

## 2021-08-06 DIAGNOSIS — Z91018 Allergy to other foods: Secondary | ICD-10-CM

## 2021-08-06 LAB — BAYER DCA HB A1C WAIVED: HB A1C (BAYER DCA - WAIVED): 6.4 % — ABNORMAL HIGH (ref 4.8–5.6)

## 2021-08-06 MED ORDER — EPINEPHRINE 0.3 MG/0.3ML IJ SOAJ: 0.3000 mg | INTRAMUSCULAR | 2 refills | Status: AC | PRN

## 2021-08-06 MED ORDER — RAMIPRIL 10 MG PO CAPS: 10.0000 mg | ORAL_CAPSULE | Freq: Every day | ORAL | 0 refills | Status: DC

## 2021-08-06 MED ORDER — SIMVASTATIN 40 MG PO TABS: 40.0000 mg | ORAL_TABLET | Freq: Every day | ORAL | 1 refills | Status: DC

## 2021-08-06 MED ORDER — HYDROXYZINE PAMOATE 25 MG PO CAPS: 25.0000 mg | ORAL_CAPSULE | Freq: Three times a day (TID) | ORAL | 2 refills | Status: DC | PRN

## 2021-08-06 MED ORDER — EZETIMIBE 10 MG PO TABS: 10.0000 mg | ORAL_TABLET | Freq: Every day | ORAL | 1 refills | Status: DC

## 2021-08-06 MED ORDER — GABAPENTIN 100 MG PO CAPS: ORAL_CAPSULE | ORAL | 1 refills | Status: DC

## 2021-08-06 NOTE — Patient Instructions (Signed)

## 2021-08-06 NOTE — Progress Notes (Signed)
Subjective:    Patient ID: Lonnie Hurley, male    DOB: 1940/09/18, 81 y.o.   MRN: [id number]  Chief Complaint  Patient presents with   Medical Management of Chronic Issues   Pt presents to the office today to chronic follow up. He is followed by Urologists every 6 months for BPH. Followed by Cardiologists annually.  He is followed by Dermatologists every 6 months. He is followed by Ortho for osteoarthritis for bilateral knee pain and getting steroid injections.   He has aortic atherosclerosis and takes Zetia and Zocor.  Hypertension This is a chronic problem. The problem has been resolved since onset. The problem is controlled. Associated symptoms include malaise/fatigue, peripheral edema ("slightly") and shortness of breath. Pertinent negatives include no blurred vision. Risk factors for coronary artery disease include dyslipidemia, obesity and male gender. The current treatment provides moderate improvement. Hypertensive end-organ damage includes CAD/MI.  Diabetes He presents for his follow-up diabetic visit. He has type 2 diabetes mellitus. Pertinent negatives for diabetes include no blurred vision and no foot paresthesias. Symptoms are stable. Diabetic complications include heart disease. Pertinent negatives for diabetic complications include no nephropathy or peripheral neuropathy. Risk factors for coronary artery disease include dyslipidemia, diabetes mellitus, male sex, hypertension and sedentary lifestyle. He is following a generally healthy diet. His overall blood glucose range is 130-140 mg/dl.  Arthritis Presents for follow-up visit. He complains of pain and stiffness. The symptoms have been stable. Affected locations include the left knee, right knee, right MCP and left MCP.  Benign Prostatic Hypertrophy This is a chronic problem. The current episode started more than 1 year ago. The problem has been waxing and waning since onset. Irritative symptoms include nocturia (1). The  treatment provided moderate relief.  Hyperlipidemia This is a chronic problem. The current episode started more than 1 year ago. The problem is controlled. Exacerbating diseases include obesity. Associated symptoms include shortness of breath. Current antihyperlipidemic treatment includes ezetimibe. The current treatment provides moderate improvement of lipids. Risk factors for coronary artery disease include dyslipidemia, diabetes mellitus, male sex, hypertension and a sedentary lifestyle.     Review of Systems  Constitutional:  Positive for malaise/fatigue.  Eyes:  Negative for blurred vision.  Respiratory:  Positive for shortness of breath.   Genitourinary:  Positive for nocturia (1).  Musculoskeletal:  Positive for arthritis and stiffness.  All other systems reviewed and are negative.     Objective:   Physical Exam Vitals reviewed.  Constitutional:      General: He is not in acute distress.    Appearance: He is well-developed. He is obese.  HENT:     Head: Normocephalic.     Right Ear: Tympanic membrane normal.     Left Ear: Tympanic membrane normal.  Eyes:     General:        Right eye: No discharge.        Left eye: No discharge.     Pupils: Pupils are equal, round, and reactive to light.  Neck:     Thyroid: No thyromegaly.  Cardiovascular:     Rate and Rhythm: Normal rate and regular rhythm.     Heart sounds: Murmur heard.  Pulmonary:     Effort: Pulmonary effort is normal. No respiratory distress.     Breath sounds: Normal breath sounds. No wheezing.  Abdominal:     General: Bowel sounds are normal. There is no distension.     Palpations: Abdomen is soft.     Tenderness: There is  no abdominal tenderness.  Musculoskeletal:        General: No tenderness. Normal range of motion.     Cervical back: Normal range of motion and neck supple.     Right lower leg: Edema (trace) present.     Left lower leg: Edema (trace) present.  Skin:    General: Skin is warm and dry.      Findings: No erythema or rash.  Neurological:     Mental Status: He is alert and oriented to person, place, and time.     Cranial Nerves: No cranial nerve deficit.     Deep Tendon Reflexes: Reflexes are normal and symmetric.  Psychiatric:        Behavior: Behavior normal.        Thought Content: Thought content normal.        Judgment: Judgment normal.     BP 127/71    Pulse 78    Temp (!) 97.1 F (36.2 C) (Temporal)    Ht 5' 6"  (1.676 m)    Wt 246 lb (111.6 kg)    SpO2 95%    BMI 39.71 kg/m      Assessment & Plan:  Lonnie Hurley comes in today with chief complaint of Medical Management of Chronic Issues   Diagnosis and orders addressed:  1. Hyperlipidemia, unspecified hyperlipidemia type - ezetimibe (ZETIA) 10 MG tablet; Take 1 tablet (10 mg total) by mouth daily.  Dispense: 90 tablet; Refill: 1 - simvastatin (ZOCOR) 40 MG tablet; Take 1 tablet (40 mg total) by mouth at bedtime.  Dispense: 90 tablet; Refill: 1 - CMP14+EGFR - CBC with Differential/Platelet  2. Pain in both lower extremities - gabapentin (NEURONTIN) 100 MG capsule; TAKE 1 CAPSULE BY MOUTH THREE TIMES A DAY  Dispense: 270 capsule; Refill: 1 - CMP14+EGFR - CBC with Differential/Platelet  3. Allergy to alpha-gal - hydrOXYzine (VISTARIL) 25 MG capsule; Take 1 capsule (25 mg total) by mouth 3 (three) times daily as needed.  Dispense: 60 capsule; Refill: 2 - CMP14+EGFR - CBC with Differential/Platelet  4. Hives - hydrOXYzine (VISTARIL) 25 MG capsule; Take 1 capsule (25 mg total) by mouth 3 (three) times daily as needed.  Dispense: 60 capsule; Refill: 2 - CMP14+EGFR - CBC with Differential/Platelet  5. Essential hypertension - ramipril (ALTACE) 10 MG capsule; Take 1 capsule (10 mg total) by mouth daily.  Dispense: 10 capsule; Refill: 0 - CMP14+EGFR - CBC with Differential/Platelet  6. Primary hypertension - CMP14+EGFR - CBC with Differential/Platelet  7. Type 2 diabetes mellitus with other specified  complication, without long-term current use of insulin (HCC) - CMP14+EGFR - CBC with Differential/Platelet - Bayer DCA Hb A1c Waived  8. Benign prostatic hyperplasia with lower urinary tract symptoms, symptom details unspecified - CMP14+EGFR - CBC with Differential/Platelet  9. Arthritis of back - CMP14+EGFR - CBC with Differential/Platelet  10. Chronic pain of right knee  - CMP14+EGFR - CBC with Differential/Platelet  11. Aortic atherosclerosis ([place]) - CMP14+EGFR - CBC with Differential/Platelet   Labs pending Health Maintenance reviewed Diet and exercise encouraged  Follow up plan: 6 months    [name], FNP

## 2021-08-07 LAB — CBC WITH DIFFERENTIAL/PLATELET
Basophils Absolute: 0.1 10*3/uL (ref 0.0–0.2)
Basos: 1 %
EOS (ABSOLUTE): 0.3 10*3/uL (ref 0.0–0.4)
Eos: 3 %
Hematocrit: 45.6 % (ref 37.5–51.0)
Hemoglobin: 15.1 g/dL (ref 13.0–17.7)
Immature Grans (Abs): 0 10*3/uL (ref 0.0–0.1)
Immature Granulocytes: 0 %
Lymphocytes Absolute: 3.2 10*3/uL — ABNORMAL HIGH (ref 0.7–3.1)
Lymphs: 35 %
MCH: 29.8 pg (ref 26.6–33.0)
MCHC: 33.1 g/dL (ref 31.5–35.7)
MCV: 90 fL (ref 79–97)
Monocytes Absolute: 1.1 10*3/uL — ABNORMAL HIGH (ref 0.1–0.9)
Monocytes: 11 %
Neutrophils Absolute: 4.5 10*3/uL (ref 1.4–7.0)
Neutrophils: 50 %
Platelets: 151 10*3/uL (ref 150–450)
RBC: 5.06 x10E6/uL (ref 4.14–5.80)
RDW: 12.7 % (ref 11.6–15.4)
WBC: 9.2 10*3/uL (ref 3.4–10.8)

## 2021-08-07 LAB — CMP14+EGFR
ALT: 34 IU/L (ref 0–44)
AST: 35 IU/L (ref 0–40)
Albumin/Globulin Ratio: 2 (ref 1.2–2.2)
Albumin: 4.1 g/dL (ref 3.7–4.7)
Alkaline Phosphatase: 68 IU/L (ref 44–121)
BUN/Creatinine Ratio: 14 (ref 10–24)
BUN: 14 mg/dL (ref 8–27)
Bilirubin Total: 0.6 mg/dL (ref 0.0–1.2)
CO2: 28 mmol/L (ref 20–29)
Calcium: 9.2 mg/dL (ref 8.6–10.2)
Chloride: 105 mmol/L (ref 96–106)
Creatinine, Ser: 0.97 mg/dL (ref 0.76–1.27)
Globulin, Total: 2.1 g/dL (ref 1.5–4.5)
Glucose: 145 mg/dL — ABNORMAL HIGH (ref 70–99)
Potassium: 4.6 mmol/L (ref 3.5–5.2)
Sodium: 143 mmol/L (ref 134–144)
Total Protein: 6.2 g/dL (ref 6.0–8.5)
eGFR: 80 mL/min/{1.73_m2} (ref >59)

## 2021-08-08 ENCOUNTER — Telehealth: Payer: Self-pay | Admitting: Family

## 2021-08-08 ENCOUNTER — Ambulatory Visit (INDEPENDENT_AMBULATORY_CARE_PROVIDER_SITE_OTHER): Payer: PPO | Admitting: *Deleted

## 2021-08-08 DIAGNOSIS — L509 Urticaria, unspecified: Secondary | ICD-10-CM

## 2021-08-08 DIAGNOSIS — E538 Deficiency of other specified B group vitamins: Secondary | ICD-10-CM | POA: Diagnosis not present

## 2021-08-08 DIAGNOSIS — I10 Essential (primary) hypertension: Secondary | ICD-10-CM

## 2021-08-08 DIAGNOSIS — Z91018 Allergy to other foods: Secondary | ICD-10-CM

## 2021-08-08 MED ORDER — RAMIPRIL 10 MG PO CAPS: 10.0000 mg | ORAL_CAPSULE | Freq: Every day | ORAL | 4 refills | Status: DC

## 2021-08-08 MED ORDER — HYDROXYZINE PAMOATE 25 MG PO CAPS: 25.0000 mg | ORAL_CAPSULE | Freq: Three times a day (TID) | ORAL | 4 refills | Status: DC | PRN

## 2021-08-08 NOTE — Telephone Encounter (Signed)
Fixed and corrected sent to pharmacy. Tried to call and was on hold.

## 2021-08-08 NOTE — Telephone Encounter (Signed)
Fixed and sent to pharmacy corrected

## 2021-08-08 NOTE — Progress Notes (Signed)
Pt given 1085mcg B12 injection IM r-deltoid. Pt tol well

## 2021-08-12 DIAGNOSIS — R262 Difficulty in walking, not elsewhere classified: Secondary | ICD-10-CM | POA: Diagnosis not present

## 2021-08-12 DIAGNOSIS — R2689 Other abnormalities of gait and mobility: Secondary | ICD-10-CM | POA: Diagnosis not present

## 2021-08-12 DIAGNOSIS — M545 Low back pain, unspecified: Secondary | ICD-10-CM | POA: Diagnosis not present

## 2021-08-12 DIAGNOSIS — R531 Weakness: Secondary | ICD-10-CM | POA: Diagnosis not present

## 2021-08-13 ENCOUNTER — Ambulatory Visit (INDEPENDENT_AMBULATORY_CARE_PROVIDER_SITE_OTHER): Payer: PPO

## 2021-08-13 DIAGNOSIS — M5136 Other intervertebral disc degeneration, lumbar region: Secondary | ICD-10-CM

## 2021-08-13 DIAGNOSIS — M479 Spondylosis, unspecified: Secondary | ICD-10-CM

## 2021-08-13 DIAGNOSIS — I10 Essential (primary) hypertension: Secondary | ICD-10-CM

## 2021-08-13 DIAGNOSIS — E785 Hyperlipidemia, unspecified: Secondary | ICD-10-CM

## 2021-08-13 DIAGNOSIS — E1169 Type 2 diabetes mellitus with other specified complication: Secondary | ICD-10-CM

## 2021-08-13 NOTE — Patient Instructions (Addendum)
Visit Information  Patient Goals:  Manage Emotions, Complete ADLs as able. Manage mobility needs  Timeframe:  Short-Term Goal Priority:  Medium Progress: On Track Start Date:         08/13/21               Expected End Date:         11/04/21               Follow Up Date  10/07/21 at 1:00 PM    Manage Emotions: Complete ADLs as able ;Manage mobility needs   Why is this important?   When you are stressed, down or upset, your body reacts too.  For example, your blood pressure may get higher; you may have a headache or stomachache.  When your emotions get the best of you, your body's ability to fight off cold and flu gets weak.  These steps will help you manage your emotions.     Patient Coping Skills: Attends scheduled medical appointments Takes medications as prescribed Has some support from his brother, Lonnie Hurley  Patient Deficits:  Mobility issues May have some challenges in completing ADls  Patient Goals: In next 30 days,  Patient will attend scheduled medical appointments Will take medications as prescribed Will call RNCM or LCSW as needed for CCM support  Follow Up Plan: LCSW to call client or Lonnie Hurley, brother of client, on 10/07/21 at 1:00 PM to assess needs of client at that time   Lonnie Hurley.Lonnie Hurley MSW, [place] [employer] representative [facility] Care Management [phone]

## 2021-08-13 NOTE — Chronic Care Management (AMB) (Signed)
Chronic Care Management    Clinical Social Work Note  08/13/2021 Name: Lonnie Hurley MRN: [id number] DOB: [birth year]  Lonnie Hurley is a 81 y.o. year old male who is a primary care patient of Sharion Balloon, FNP. The CCM team was consulted to assist the patient with chronic disease management and/or care coordination needs related to: [name] .   Engaged with patient by telephone for follow up visit in response to provider referral for social work chronic care management and care coordination services.   Consent to Services:  The patient was given information about Chronic Care Management services, agreed to services, and gave verbal consent prior to initiation of services.  Please see initial visit note for detailed documentation.   Patient agreed to services and consent obtained.   Assessment: Review of patient past medical history, allergies, medications, and health status, including review of relevant consultants reports was performed today as part of a comprehensive evaluation and provision of chronic care management and care coordination services.     SDOH (Social Determinants of Health) assessments and interventions performed:  SDOH Interventions    Flowsheet Row Most Recent Value  SDOH Interventions   Physical Activity Interventions Other (Comments)  [client uses a cane to help him walk]  Stress Interventions Provide Counseling  [client has stress related to managing medical needs]  Depression Interventions/Treatment  Counseling        Advanced Directives Status: See Vynca application for related entries.  CCM Care Plan  Allergies  Allergen Reactions   Penicillins Other (See Comments)    unknown   Aspirin Other (See Comments)    Causes Blood in urine   Crestor [Rosuvastatin Calcium] Other (See Comments)    Myalgia, "joint problem"   Gabapentin Other (See Comments)    Blurry vision Pt able to take in small doses    Hydrocodone Other (See Comments)     Constipation     Lyrica [Pregabalin]     Blurry vision     Outpatient Encounter Medications as of 08/13/2021  Medication Sig   acetaminophen (TYLENOL) 500 MG tablet Take 1 tablet (500 mg total) by mouth every 6 (six) hours as needed.   Calcium Carb-Cholecalciferol (CALCIUM 600 + D PO) Take by mouth 2 (two) times daily.    diclofenac Sodium (VOLTAREN) 1 % GEL Apply 2 g topically 4 (four) times daily.   EPINEPHrine 0.3 mg/0.3 mL IJ SOAJ injection Inject 0.3 mg into the muscle as needed for anaphylaxis.   ezetimibe (ZETIA) 10 MG tablet Take 1 tablet (10 mg total) by mouth daily.   gabapentin (NEURONTIN) 100 MG capsule TAKE 1 CAPSULE BY MOUTH THREE TIMES A DAY   Glucosamine-Chondroit-Vit C-Mn (GLUCOSAMINE 1500 COMPLEX PO) Take by mouth 2 (two) times daily.    glucose blood (ONETOUCH ULTRA) test strip Test BS daily Dx E11.9   hydrOXYzine (VISTARIL) 25 MG capsule Take 1 capsule (25 mg total) by mouth 3 (three) times daily as needed.   meloxicam (MOBIC) 7.5 MG tablet Take 1 tablet (7.5 mg total) by mouth daily.   Multiple Vitamins-Minerals (MULTIVITAMIN WITH MINERALS) tablet Take 1 tablet by mouth daily.   polyethylene glycol powder (MIRALAX) 17 GM/SCOOP powder Take 17 g by mouth as needed for moderate constipation.   ramipril (ALTACE) 10 MG capsule Take 1 capsule (10 mg total) by mouth daily.   simvastatin (ZOCOR) 40 MG tablet Take 1 tablet (40 mg total) by mouth at bedtime.   tamsulosin (FLOMAX) 0.4 MG CAPS capsule Take 1 capsule (  0.4 mg total) by mouth at bedtime.   Vitamin D, Ergocalciferol, (DRISDOL) 1.25 MG (50000 UNIT) CAPS capsule Take 1 capsule (50,000 Units total) by mouth every 7 (seven) days.   Facility-Administered Encounter Medications as of 08/13/2021  Medication   cyanocobalamin ((VITAMIN B-12)) injection 1,000 mcg    Patient Active Problem List   Diagnosis Date Noted   Aortic atherosclerosis ([place]) 08/06/2021   Pain in both thighs 05/15/2021   Allergy to alpha-gal  01/04/2021   Haglund's deformity of right heel 06/27/2019   Chronic pain of right knee 04/14/2018   Heel pain, chronic, right 03/19/2018   Foot pain, bilateral 03/19/2018   Kidney stone 03/19/2018   Arthritis of back 08/25/2016   AAA (abdominal aortic aneurysm) without rupture 07/13/2016   Lumbar spondylosis with myelopathy 07/13/2016   Diabetes mellitus ([place]) 12/75/1700   Metabolic syndrome 17/49/4496   Vitamin B 12 deficiency 12/14/2014   Hereditary and idiopathic peripheral neuropathy 10/17/2014   Vitamin D deficiency 06/29/2014   BPH (benign prostatic hyperplasia) 06/29/2014   Hypertension 11/17/2012   Hyperlipidemia 11/17/2012   DDD (degenerative disc disease), lumbar 11/17/2012   Thrombocytopenia ([place])     Conditions to be addressed/monitored: monitor client completion of daily ADLs  Care Plan : LCSW care plan  Updates made by Katha Cabal, LCSW since 08/13/2021 12:00 AM     Problem: Functional Decline      Goal: Mobility and Function Maintained;Complete ADLs daily as able   Start Date: 08/13/2021  Expected End Date: 11/04/2021  This Visit's Progress: On track  Recent Progress: On track  Priority: Medium  Note:   Current barriers:   Patient in need of assistance with connecting to community resources for possible help in completing his daily ADLs and other daily activities Patient is unable to independently navigate community resource options without care coordination support Mobility issues Social isolation issues Pain issues  Clinical Goals:  patient will work with SW monthly to address concerns related to ADLs completion of client and mobility challenges of client Patient will call LCSW in next 30 days to discuss social isolation issues of client  Patient will call RNCM in next 30 days as needed to discuss nursing needs of client  Clinical Interventions:  Collaboration with Sharion Balloon, FNP regarding development and update of comprehensive plan of  care as evidenced by provider attestation and co-signature Discussed with [name], the current needs of client. Discussed with Kie the pain issues of client. Camry said he has pain issues in his thighs and he said he has pain issues in his knees. Discussed medication procurement for client Reviewed sleeping issues of client.  Tyrelle said he was sleeping well at present Reviewed ADLs completion of client. Hildreth said he is completing daily ADLs.  Discussed client mobility. Jaiyden said he has some pain issues in his knees. He said he uses a cane to help him walk. Reviewed mood status of client . Lopaka said he thought his mood was stable at present. He said he may get a little sad occasionally. But, overall, he said his mood was doing well. He said that he enjoys cooking meals with his brother, Alveta Heimlich.  They cook meals together and share meals together.  Client has daily social interaction with his brother, Ilija Maxim. Encouraged client to call RNCM as needed for CCM nursing support Client said he uses a sock aide to put on socks He said he has no problem putting on his shoes.  He does have a  cane but did not mention any other equipment items. Reviewed energy level of client. He said he does not have as much energy as he used to have. He likes working outdoors in his yard. He likes working with his apple trees.   Patient Coping Skills/Strengths: Completes ADLs daily as able Takes medications as prescribed Attends scheduled medical appointments  Patient Deficits  Some mobility challenges Reduced social contact with others  Patient Goals:   Patient will talk with LCSW in next 30 days about mobility of client and about client completion of daily ADLs Patient will socialize with others or talk with others 2-3 times weekly in next 30 days Patient will attend scheduled medical appointments in next 30 days -  Follow Up Plan: LCSW to call client or Alveta Heimlich, brother of client on  10/07/21 at 1:00 PM to assess client needs.      [name].Challen Spainhour MSW, [place] [employer] representative [facility] Care Management [phone]

## 2021-08-21 DIAGNOSIS — M545 Low back pain, unspecified: Secondary | ICD-10-CM | POA: Diagnosis not present

## 2021-08-21 DIAGNOSIS — R2689 Other abnormalities of gait and mobility: Secondary | ICD-10-CM | POA: Diagnosis not present

## 2021-08-21 DIAGNOSIS — R262 Difficulty in walking, not elsewhere classified: Secondary | ICD-10-CM | POA: Diagnosis not present

## 2021-08-21 DIAGNOSIS — R531 Weakness: Secondary | ICD-10-CM | POA: Diagnosis not present

## 2021-08-27 DIAGNOSIS — R2689 Other abnormalities of gait and mobility: Secondary | ICD-10-CM | POA: Diagnosis not present

## 2021-08-27 DIAGNOSIS — M545 Low back pain, unspecified: Secondary | ICD-10-CM | POA: Diagnosis not present

## 2021-08-27 DIAGNOSIS — R262 Difficulty in walking, not elsewhere classified: Secondary | ICD-10-CM | POA: Diagnosis not present

## 2021-08-27 DIAGNOSIS — R531 Weakness: Secondary | ICD-10-CM | POA: Diagnosis not present

## 2021-09-02 DIAGNOSIS — R531 Weakness: Secondary | ICD-10-CM | POA: Diagnosis not present

## 2021-09-02 DIAGNOSIS — M545 Low back pain, unspecified: Secondary | ICD-10-CM | POA: Diagnosis not present

## 2021-09-02 DIAGNOSIS — L820 Inflamed seborrheic keratosis: Secondary | ICD-10-CM | POA: Diagnosis not present

## 2021-09-02 DIAGNOSIS — R262 Difficulty in walking, not elsewhere classified: Secondary | ICD-10-CM | POA: Diagnosis not present

## 2021-09-02 DIAGNOSIS — Z85828 Personal history of other malignant neoplasm of skin: Secondary | ICD-10-CM | POA: Diagnosis not present

## 2021-09-02 DIAGNOSIS — L308 Other specified dermatitis: Secondary | ICD-10-CM | POA: Diagnosis not present

## 2021-09-02 DIAGNOSIS — R2689 Other abnormalities of gait and mobility: Secondary | ICD-10-CM | POA: Diagnosis not present

## 2021-09-09 ENCOUNTER — Ambulatory Visit (INDEPENDENT_AMBULATORY_CARE_PROVIDER_SITE_OTHER): Payer: PPO | Admitting: *Deleted

## 2021-09-09 DIAGNOSIS — E538 Deficiency of other specified B group vitamins: Secondary | ICD-10-CM

## 2021-09-09 NOTE — Progress Notes (Signed)
Vitamin b12 injection given and patient tolerated well.  

## 2021-09-10 DIAGNOSIS — R531 Weakness: Secondary | ICD-10-CM | POA: Diagnosis not present

## 2021-09-10 DIAGNOSIS — R2689 Other abnormalities of gait and mobility: Secondary | ICD-10-CM | POA: Diagnosis not present

## 2021-09-10 DIAGNOSIS — M545 Low back pain, unspecified: Secondary | ICD-10-CM | POA: Diagnosis not present

## 2021-09-10 DIAGNOSIS — R262 Difficulty in walking, not elsewhere classified: Secondary | ICD-10-CM | POA: Diagnosis not present

## 2021-09-26 DIAGNOSIS — M545 Low back pain, unspecified: Secondary | ICD-10-CM | POA: Diagnosis not present

## 2021-09-26 DIAGNOSIS — R2689 Other abnormalities of gait and mobility: Secondary | ICD-10-CM | POA: Diagnosis not present

## 2021-09-26 DIAGNOSIS — R531 Weakness: Secondary | ICD-10-CM | POA: Diagnosis not present

## 2021-09-26 DIAGNOSIS — R262 Difficulty in walking, not elsewhere classified: Secondary | ICD-10-CM | POA: Diagnosis not present

## 2021-10-02 DIAGNOSIS — M545 Low back pain, unspecified: Secondary | ICD-10-CM | POA: Diagnosis not present

## 2021-10-02 DIAGNOSIS — R531 Weakness: Secondary | ICD-10-CM | POA: Diagnosis not present

## 2021-10-02 DIAGNOSIS — R2689 Other abnormalities of gait and mobility: Secondary | ICD-10-CM | POA: Diagnosis not present

## 2021-10-02 DIAGNOSIS — R262 Difficulty in walking, not elsewhere classified: Secondary | ICD-10-CM | POA: Diagnosis not present

## 2021-10-07 ENCOUNTER — Ambulatory Visit (INDEPENDENT_AMBULATORY_CARE_PROVIDER_SITE_OTHER): Payer: PPO | Admitting: Licensed Clinical Social Worker

## 2021-10-07 DIAGNOSIS — M5136 Other intervertebral disc degeneration, lumbar region: Secondary | ICD-10-CM

## 2021-10-07 DIAGNOSIS — M479 Spondylosis, unspecified: Secondary | ICD-10-CM

## 2021-10-07 DIAGNOSIS — E1169 Type 2 diabetes mellitus with other specified complication: Secondary | ICD-10-CM

## 2021-10-07 DIAGNOSIS — I10 Essential (primary) hypertension: Secondary | ICD-10-CM

## 2021-10-07 DIAGNOSIS — E785 Hyperlipidemia, unspecified: Secondary | ICD-10-CM

## 2021-10-07 NOTE — Chronic Care Management (AMB) (Signed)
?Chronic Care Management  ? ? Clinical Social Work Note ? ?10/07/2021 ?Name: Lonnie Hurley MRN: [id number] DOB: 29-Apr-1941 ? ?Lonnie Hurley is a 81 y.o. year old male who is a primary care patient of Sharion Balloon, FNP. The CCM team was consulted to assist the patient with chronic disease management and/or care coordination needs related to: [name] .  ? ?Engaged with patient / cousin of patient, Lonnie Hurley, by telephone for follow up visit in response to provider referral for social work chronic care management and care coordination services.  ? ?Consent to Services:  ?The patient was given information about Chronic Care Management services, agreed to services, and gave verbal consent prior to initiation of services.  Please see initial visit note for detailed documentation.  ? ?Patient agreed to services and consent obtained.  ? ?Assessment: Review of patient past medical history, allergies, medications, and health status, including review of relevant consultants reports was performed today as part of a comprehensive evaluation and provision of chronic care management and care coordination services.    ? ?SDOH (Social Determinants of Health) assessments and interventions performed:  ?SDOH Interventions   ? ?Flowsheet Row Most Recent Value  ?SDOH Interventions   ?Physical Activity Interventions Other (Comments)  [walking challenges. he uses a cane to help him walk]  ?Stress Interventions Other (Comment)  [client has some stress related to managing medical needs]  ? ?  ?  ? ?Advanced Directives Status: See Vynca application for related entries. ? ?CCM Care Plan ? ?Allergies  ?Allergen Reactions  ? Penicillins Other (See Comments)  ?  unknown  ? Aspirin Other (See Comments)  ?  Causes Blood in urine  ? Crestor [Rosuvastatin Calcium] Other (See Comments)  ?  Myalgia, "joint problem"  ? Gabapentin Other (See Comments)  ?  Blurry vision ?Pt able to take in small doses   ? Hydrocodone Other (See  Comments)  ?  Constipation  ?  ? Lyrica [Pregabalin]   ?  Blurry vision ?  ? ? ?Outpatient Encounter Medications as of 10/07/2021  ?Medication Sig  ? acetaminophen (TYLENOL) 500 MG tablet Take 1 tablet (500 mg total) by mouth every 6 (six) hours as needed.  ? Calcium Carb-Cholecalciferol (CALCIUM 600 + D PO) Take by mouth 2 (two) times daily.   ? diclofenac Sodium (VOLTAREN) 1 % GEL Apply 2 g topically 4 (four) times daily.  ? EPINEPHrine 0.3 mg/0.3 mL IJ SOAJ injection Inject 0.3 mg into the muscle as needed for anaphylaxis.  ? ezetimibe (ZETIA) 10 MG tablet Take 1 tablet (10 mg total) by mouth daily.  ? gabapentin (NEURONTIN) 100 MG capsule TAKE 1 CAPSULE BY MOUTH THREE TIMES A DAY  ? Glucosamine-Chondroit-Vit C-Mn (GLUCOSAMINE 1500 COMPLEX PO) Take by mouth 2 (two) times daily.   ? glucose blood (ONETOUCH ULTRA) test strip Test BS daily Dx E11.9  ? hydrOXYzine (VISTARIL) 25 MG capsule Take 1 capsule (25 mg total) by mouth 3 (three) times daily as needed.  ? meloxicam (MOBIC) 7.5 MG tablet Take 1 tablet (7.5 mg total) by mouth daily.  ? Multiple Vitamins-Minerals (MULTIVITAMIN WITH MINERALS) tablet Take 1 tablet by mouth daily.  ? polyethylene glycol powder (MIRALAX) 17 GM/SCOOP powder Take 17 g by mouth as needed for moderate constipation.  ? ramipril (ALTACE) 10 MG capsule Take 1 capsule (10 mg total) by mouth daily.  ? simvastatin (ZOCOR) 40 MG tablet Take 1 tablet (40 mg total) by mouth at bedtime.  ? tamsulosin (FLOMAX) 0.4 MG CAPS  capsule Take 1 capsule (0.4 mg total) by mouth at bedtime.  ? Vitamin D, Ergocalciferol, (DRISDOL) 1.25 MG (50000 UNIT) CAPS capsule Take 1 capsule (50,000 Units total) by mouth every 7 (seven) days.  ? ?Facility-Administered Encounter Medications as of 10/07/2021  ?Medication  ? cyanocobalamin ((VITAMIN B-12)) injection 1,000 mcg  ? ? ?Patient Active Problem List  ? Diagnosis Date Noted  ? Aortic atherosclerosis ([place]) 08/06/2021  ? Pain in both thighs 05/15/2021  ? Allergy to  alpha-gal 01/04/2021  ? Haglund's deformity of right heel 06/27/2019  ? Chronic pain of right knee 04/14/2018  ? Heel pain, chronic, right 03/19/2018  ? Foot pain, bilateral 03/19/2018  ? Kidney stone 03/19/2018  ? Arthritis of back 08/25/2016  ? AAA (abdominal aortic aneurysm) without rupture ([place]) 07/13/2016  ? Lumbar spondylosis with myelopathy 07/13/2016  ? Diabetes mellitus ([place]) 09/18/2015  ? Metabolic syndrome 00/93/8182  ? Vitamin B 12 deficiency 12/14/2014  ? Hereditary and idiopathic peripheral neuropathy 10/17/2014  ? Vitamin D deficiency 06/29/2014  ? BPH (benign prostatic hyperplasia) 06/29/2014  ? Hypertension 11/17/2012  ? Hyperlipidemia 11/17/2012  ? DDD (degenerative disc disease), lumbar 11/17/2012  ? Thrombocytopenia ([place])   ? ? ?Conditions to be addressed/monitored: Monitor client completion of ADLs daily ? ?Care Plan : LCSW care plan  ?Updates made by Katha Cabal, LCSW since 10/07/2021 12:00 AM  ?  ? ?Problem: Functional Decline   ?  ? ?Goal: Mobility and Function Maintained;Complete ADLs daily as able   ?Start Date: 08/13/2021  ?Expected End Date: 01/03/2022  ?This Visit's Progress: On track  ?Recent Progress: On track  ?Priority: Medium  ?Note:   ?Current barriers:   ?Patient in need of assistance with connecting to community resources for possible help in completing his daily ADLs and other daily activities ?Patient is unable to independently navigate community resource options without care coordination support ?Mobility issues ?Social isolation issues ?Pain issues ? ?Clinical Goals:  ?patient will work with SW monthly to address concerns related to ADLs completion of client and mobility challenges of client ?Patient will call LCSW in next 30 days to discuss social isolation issues of client  ?Patient will call RNCM in next 30 days as needed to discuss nursing needs of client ? ?Clinical Interventions:  ?Collaboration with Sharion Balloon, FNP regarding development and update of  comprehensive plan of care as evidenced by provider attestation and co-signature ?Discussed with Lonnie Hurley, cousin of client, the current needs of client ?Reviewed with Lonnie Hurley sleeping issues of client. She said he is sleeping well. ?Reviewed appetite issues. She said he is eating well ?Discussed client ambulation with Lonnie Hurley. She said that Josie has pain in his knees when he walks. He uses a cane to walk. ?Reviewed ramp provision for client. Lonnie Hurley said she has heard Cruz discuss ramp provision.  LCSW had previously encouraged Kemani to call [employer] in [place], [place] to discuss aluminum portable ramps they may have available for purchase  Lonnie Hurley said she thought a ramp would be helpful to client ?Reviewed decreased energy level of client. Discussed with Lonnie Hurley relaxation techniques of client (client likes to be outdoors and do projects outdoors, he enjoys caring for apple trees he owns) ?Discussed with Lonnie Hurley the pain issues of client.  ?Discussed medication procurement for client ?Reviewed mood status of client .Lonnie Hurley said she thought mood of client was stable at this time. ?Encouraged client  or Lonnie Hurley to call RNCM as needed for CCM nursing support for client ?Discussed client meal provision  with Lonnie Hurley ? ?Patient Coping Skills/Strengths: ?Completes ADLs daily as able ?Takes medications as prescribed ?Attends scheduled medical appointments ? ?Patient Deficits ? ?Some mobility challenges ?Reduced social contact with others ? ?Patient Goals:  ? ?Patient will talk with LCSW in next 30 days about mobility of client and about client completion of daily ADLs ?Patient will socialize with others or talk with others 2-3 times weekly in next 30 days ?Patient will attend scheduled medical appointments in next 30 days ?-  ?Follow Up Plan: LCSW to call client or Lonnie Hurley, on 11/21/21 at 2:00 PM to assess client needs.    ?  ?[name].Kahmari Koller MSW, LCSW ?Licensed Clinical Social Worker ?[place]  Management ?[phone] ?

## 2021-10-07 NOTE — Patient Instructions (Addendum)
Visit Information ? ?Patient Goals:  Manage emotions. Complete ADLs as able. Manage mobility needs ? ?Timeframe:  Short-Term Goal ?Priority:  Medium ?Progress: On Track ?Start Date:         08/13/21               ?Expected End Date:         01/02/22               ? ?Follow Up Date  11/21/21 at 2:00 PM   ?  ?Manage Emotions: Complete ADLs as able ;Manage mobility needs ?  ?Why is this important?   ?When you are stressed, down or upset, your body reacts too.  ?For example, your blood pressure may get higher; you may have a headache or stomachache.  ?When your emotions get the best of you, your body's ability to fight off cold and flu gets weak.  ?These steps will help you manage your emotions.    ? ?Patient Coping Skills: ?Attends scheduled medical appointments ?Takes medications as prescribed ?Has some support from his brother, Lonnie Hurley ? ?Patient Deficits: ? ?Mobility issues ?May have some challenges in completing ADls ? ?Patient Goals: In next 30 days,  ?Patient will attend scheduled medical appointments ?Will take medications as prescribed ?Will call RNCM or LCSW as needed for CCM support ? ?Follow Up Plan: LCSW to call client or  Lonnie Hurley, on 11/21/21 at 2:00 PM to assess needs of client at that time  ? ?Lonnie Hurley.Lonnie Hurley MSW, LCSW ?Licensed Clinical Social Worker ?[place] [facility] ?[phone] ?

## 2021-10-08 ENCOUNTER — Ambulatory Visit: Payer: PPO | Admitting: Licensed Clinical Social Worker

## 2021-10-08 DIAGNOSIS — E785 Hyperlipidemia, unspecified: Secondary | ICD-10-CM

## 2021-10-08 DIAGNOSIS — M5136 Other intervertebral disc degeneration, lumbar region: Secondary | ICD-10-CM

## 2021-10-08 DIAGNOSIS — E1169 Type 2 diabetes mellitus with other specified complication: Secondary | ICD-10-CM

## 2021-10-08 DIAGNOSIS — I10 Essential (primary) hypertension: Secondary | ICD-10-CM

## 2021-10-08 DIAGNOSIS — M479 Spondylosis, unspecified: Secondary | ICD-10-CM

## 2021-10-08 NOTE — Chronic Care Management (AMB) (Signed)
?Chronic Care Management  ? ? Clinical Social Work Note ? ?10/08/2021 ?Name: Lonnie Hurley MRN: [id number] DOB: September 11, 1940 ? ?Lonnie Hurley is a 81 y.o. year old male who is a primary care patient of Sharion Balloon, FNP. The CCM team was consulted to assist the patient with chronic disease management and/or care coordination needs related to: [name] .  ? ?Engaged with patient by telephone for follow up visit in response to provider referral for social work chronic care management and care coordination services.  ? ?Consent to Services:  ?The patient was given information about Chronic Care Management services, agreed to services, and gave verbal consent prior to initiation of services.  Please see initial visit note for detailed documentation.  ? ?Patient agreed to services and consent obtained.  ? ?Assessment: Review of patient past medical history, allergies, medications, and health status, including review of relevant consultants reports was performed today as part of a comprehensive evaluation and provision of chronic care management and care coordination services.    ? ?SDOH (Social Determinants of Health) assessments and interventions performed:  ?SDOH Interventions   ? ?Flowsheet Row Most Recent Value  ?SDOH Interventions   ?Physical Activity Interventions Other (Comments)  [walking challenges. he uses a cane to help him walk]  ?Stress Interventions Provide Counseling  [client has stress related to managing medical needs]  ? ?  ?  ? ?Advanced Directives Status: See Vyn[employer] application for related entries. ? ?CCM Care Plan ? ?Allergies  ?Allergen Reactions  ? Penicillins Other (See Comments)  ?  unknown  ? Aspirin Other (See Comments)  ?  Causes Blood in urine  ? Crestor [Rosuvastatin Calcium] Other (See Comments)  ?  Myalgia, "joint problem"  ? Gabapentin Other (See Comments)  ?  Blurry vision ?Pt able to take in small doses   ? Hydrocodone Other (See Comments)  ?  Constipation  ?  ? Lyrica  [Pregabalin]   ?  Blurry vision ?  ? ? ?Outpatient Encounter Medications as of 10/08/2021  ?Medication Sig  ? acetaminophen (TYLENOL) 500 MG tablet Take 1 tablet (500 mg total) by mouth every 6 (six) hours as needed.  ? Calcium Carb-Cholecalciferol (CALCIUM 600 + D PO) Take by mouth 2 (two) times daily.   ? diclofenac Sodium (VOLTAREN) 1 % GEL Apply 2 g topically 4 (four) times daily.  ? EPINEPHrine 0.3 mg/0.3 mL IJ SOAJ injection Inject 0.3 mg into the muscle as needed for anaphylaxis.  ? ezetimibe (ZETIA) 10 MG tablet Take 1 tablet (10 mg total) by mouth daily.  ? gabapentin (NEURONTIN) 100 MG capsule TAKE 1 CAPSULE BY MOUTH THREE TIMES A DAY  ? Glucosamine-Chondroit-Vit C-Mn (GLUCOSAMINE 1500 COMPLEX PO) Take by mouth 2 (two) times daily.   ? glucose blood (ONETOUCH ULTRA) test strip Test BS daily Dx E11.9  ? hydrOXYzine (VISTARIL) 25 MG capsule Take 1 capsule (25 mg total) by mouth 3 (three) times daily as needed.  ? meloxicam (MOBIC) 7.5 MG tablet Take 1 tablet (7.5 mg total) by mouth daily.  ? Multiple Vitamins-Minerals (MULTIVITAMIN WITH MINERALS) tablet Take 1 tablet by mouth daily.  ? polyethylene glycol powder (MIRALAX) 17 GM/SCOOP powder Take 17 g by mouth as needed for moderate constipation.  ? ramipril (ALTACE) 10 MG capsule Take 1 capsule (10 mg total) by mouth daily.  ? simvastatin (ZOCOR) 40 MG tablet Take 1 tablet (40 mg total) by mouth at bedtime.  ? tamsulosin (FLOMAX) 0.4 MG CAPS capsule Take 1 capsule (0.4 mg total)  by mouth at bedtime.  ? Vitamin D, Ergocalciferol, (DRISDOL) 1.25 MG (50000 UNIT) CAPS capsule Take 1 capsule (50,000 Units total) by mouth every 7 (seven) days.  ? ?Facility-Administered Encounter Medications as of 10/08/2021  ?Medication  ? cyanocobalamin ((VITAMIN B-12)) injection 1,000 mcg  ? ? ?Patient Active Problem List  ? Diagnosis Date Noted  ? Aortic atherosclerosis ([place]) 08/06/2021  ? Pain in both thighs 05/15/2021  ? Allergy to alpha-gal 01/04/2021  ? Haglund's deformity of  right heel 06/27/2019  ? Chronic pain of right knee 04/14/2018  ? Heel pain, chronic, right 03/19/2018  ? Foot pain, bilateral 03/19/2018  ? Kidney stone 03/19/2018  ? Arthritis of back 08/25/2016  ? AAA (abdominal aortic aneurysm) without rupture ([place]) 07/13/2016  ? Lumbar spondylosis with myelopathy 07/13/2016  ? Diabetes mellitus ([place]) 09/18/2015  ? Metabolic syndrome 62/26/3335  ? Vitamin B 12 deficiency 12/14/2014  ? Hereditary and idiopathic peripheral neuropathy 10/17/2014  ? Vitamin D deficiency 06/29/2014  ? BPH (benign prostatic hyperplasia) 06/29/2014  ? Hypertension 11/17/2012  ? Hyperlipidemia 11/17/2012  ? DDD (degenerative disc disease), lumbar 11/17/2012  ? Thrombocytopenia ([place])   ? ? ?Conditions to be addressed/monitored: monitor client completion of daily ADLs ? ?Care Plan : LCSW care plan  ?Updates made by Katha Cabal, LCSW since 10/08/2021 12:00 AM  ?  ? ?Problem: Functional Decline   ?  ? ?Goal: Mobility and Function Maintained;Complete ADLs daily as able   ?Start Date: 08/13/2021  ?Expected End Date: 01/03/2022  ?This Visit's Progress: On track  ?Recent Progress: On track  ?Priority: Medium  ?Note:   ?Current barriers:   ?Patient in need of assistance with connecting to community resources for possible help in completing his daily ADLs and other daily activities ?Patient is unable to independently navigate community resource options without care coordination support ?Mobility issues ?Social isolation issues ?Pain issues ? ?Clinical Goals:  ?patient will work with SW monthly to address concerns related to ADLs completion of client and mobility challenges of client ?Patient will call LCSW in next 30 days to discuss social isolation issues of client  ?Patient will call RNCM in next 30 days as needed to discuss nursing needs of client ? ?Clinical Interventions:  ?Collaboration with Sharion Balloon, FNP regarding development and update of comprehensive plan of care as evidenced by provider  attestation and co-signature ?Discussed client needs with [name]. ?Reviewed sleeping issues of client. He said he is sleeping well. ?Reviewed appetite issues. He said he is eating adequately ?Discussed health needs of his brother, Alveta Heimlich.  Client is concerned over health needs of his brother, Alveta Heimlich.  LCSW and Ashok talked about health needs of brother of client ?Discussed client ambulation. Client said he has occasional knee pain. He walks with use of a cane ?Provided counseling support for client ?Reviewed medication procurement of client ?Encouraged client to call RNCM as needed for CCM nursing support for client ?Discussed client meal provision with [name]. ? ?Patient Coping Skills/Strengths: ?Completes ADLs daily as able ?Takes medications as prescribed ?Attends scheduled medical appointments ? ?Patient Deficits ? ?Some mobility challenges ?Reduced social contact with others ? ?Patient Goals:  ? ?Patient will talk with LCSW in next 30 days about mobility of client and about client completion of daily ADLs ?Patient will socialize with others or talk with others 2-3 times weekly in next 30 days ?Patient will attend scheduled medical appointments in next 30 days ?-  ?Follow Up Plan: LCSW to call client or [name],  on 11/21/21 at 2:00 PM to assess client needs.   ?  ?  ?[name].Quintana Canelo MSW, LCSW ?Licensed Clinical Social Worker ?[facility] ?[phone] ?

## 2021-10-08 NOTE — Patient Instructions (Addendum)
Visit Information ? ?Patient Goals; Manage emotions. Complete ADLs as able. Manage mobility needs ? ?Timeframe:  Short-Term Goal ?Priority:  Medium ?Progress: On Track ?Start Date:         08/13/21               ?Expected End Date:         01/02/22               ? ?Follow Up Date  11/21/21 at 2:00 PM   ?  ?Manage Emotions: Complete ADLs as able ;Manage mobility needs ?  ?Why is this important?   ?When you are stressed, down or upset, your body reacts too.  ?For example, your blood pressure may get higher; you may have a headache or stomachache.  ?When your emotions get the best of you, your body's ability to fight off cold and flu gets weak.  ?These steps will help you manage your emotions.    ? ?Patient Coping Skills: ?Attends scheduled medical appointments ?Takes medications as prescribed ?Has some support from his brother, Lonnie Hurley ? ?Patient Deficits: ? ?Mobility issues ?May have some challenges in completing ADls ? ?Patient Goals: In next 30 days,  ?Patient will attend scheduled medical appointments ?Will take medications as prescribed ?Will call RNCM or LCSW as needed for CCM support ? ?Follow Up Plan: LCSW to call client or  Lonnie Hurley, on 11/21/21 at 2:00 PM to assess needs of client at that time  ? ?Lonnie Riffle.Dewan Hurley MSW, LCSW ?Licensed Clinical Social Worker ?[facility] ?[phone] ?

## 2021-10-10 ENCOUNTER — Ambulatory Visit (INDEPENDENT_AMBULATORY_CARE_PROVIDER_SITE_OTHER): Payer: PPO | Admitting: *Deleted

## 2021-10-10 DIAGNOSIS — E538 Deficiency of other specified B group vitamins: Secondary | ICD-10-CM

## 2021-10-10 NOTE — Progress Notes (Signed)
Patient in today for monthly B12 injection. 1000 mcg administered in right deltoid. Patient tolerated well.  ?

## 2021-10-13 DIAGNOSIS — I10 Essential (primary) hypertension: Secondary | ICD-10-CM

## 2021-10-13 DIAGNOSIS — E785 Hyperlipidemia, unspecified: Secondary | ICD-10-CM

## 2021-10-13 DIAGNOSIS — E1169 Type 2 diabetes mellitus with other specified complication: Secondary | ICD-10-CM

## 2021-10-13 DIAGNOSIS — M479 Spondylosis, unspecified: Secondary | ICD-10-CM

## 2021-10-17 ENCOUNTER — Telehealth: Payer: Self-pay | Admitting: Family

## 2021-10-17 DIAGNOSIS — M79604 Pain in right leg: Secondary | ICD-10-CM

## 2021-10-17 MED ORDER — GABAPENTIN 100 MG PO CAPS: 100.0000 mg | ORAL_CAPSULE | Freq: Every day | ORAL | 2 refills | Status: DC

## 2021-10-17 NOTE — Telephone Encounter (Signed)
Patient aware.

## 2021-10-17 NOTE — Telephone Encounter (Signed)
Done.  Narelle Schoening, FNP  

## 2021-10-17 NOTE — Telephone Encounter (Signed)
Patient said that [facility] told him that we need to resend his prescription for gabapentin (NEURONTIN) 100 MG capsule and state that he takes it 1 time a day instead of 3 times a day. He said this is urgent because he is almost out of this medicine. Please call patient when this is done because he is worried that it will not be done before he runs out.  ?

## 2021-10-21 ENCOUNTER — Ambulatory Visit (INDEPENDENT_AMBULATORY_CARE_PROVIDER_SITE_OTHER): Payer: PPO

## 2021-10-21 VITALS — Wt 246.0 lb

## 2021-10-21 DIAGNOSIS — Z0000 Encounter for general adult medical examination without abnormal findings: Secondary | ICD-10-CM | POA: Diagnosis not present

## 2021-10-21 NOTE — Progress Notes (Signed)
? ?Subjective:  ? Lonnie Hurley is a 81 y.o. male who presents for Medicare Annual/Subsequent preventive examination. ? ?Virtual Visit via Telephone Note ? ?I connected with  Lonnie Hurley on 10/21/21 at  1:15 PM EDT by telephone and verified that I am speaking with the correct person using two identifiers. ? ?Location: ?Patient: Home ?Provider: [facility] ?Persons participating in the virtual visit: patient/Nurse Health Advisor ?  ?I discussed the limitations, risks, security and privacy concerns of performing an evaluation and management service by telephone and the availability of in person appointments. The patient expressed understanding and agreed to proceed. ? ?Interactive audio and video telecommunications were attempted between this nurse and patient, however failed, due to patient having technical difficulties OR patient did not have access to video capability.  We continued and completed visit with audio only. ? ?Some vital signs may be absent or patient reported.  ? ?Deepak Bless Dionne Ano, LPN  ? ?Review of Systems    ? ?Cardiac Risk Factors include: advanced age (>28mn, >>58women);diabetes mellitus;hypertension;dyslipidemia;male gender;sedentary lifestyle;obesity (BMI >30kg/m2);Other (see comment), Risk factor comments: AAA, thrombocytopenia, aortic atherosclerosis ? ?   ?Objective:  ?  ?Today's Vitals  ? 10/21/21 1311 10/21/21 1312  ?Weight: 246 lb (111.6 kg)   ?PainSc:  5   ? ?Body mass index is 39.71 kg/m?. ? ? ?  10/21/2021  ?  1:29 PM 10/18/2020  ?  1:32 PM 10/17/2019  ? 11:42 AM 10/12/2019  ?  7:25 PM 10/15/2018  ?  2:28 PM 04/30/2018  ?  6:59 PM 02/19/2017  ? 10:29 AM  ?Advanced Directives  ?Does Patient Have a Medical Advance Directive? Yes Yes Yes Yes No No Yes  ?Type of AParamed[place]of AWrightLiving will [place] will Living will;Healthcare Power of Attorney      ?Does patient want to make changes to medical advance directive?   No - Patient declined      ?Copy of  HDuvalin Chart? No - copy requested No - copy requested No - copy requested    No - copy requested  ?Would patient like information on creating a medical advance directive?     No - Patient declined No - Patient declined   ? ? ?Current Medications (verified) ?Outpatient Encounter Medications as of 10/21/2021  ?Medication Sig  ? acetaminophen (TYLENOL) 500 MG tablet Take 1 tablet (500 mg total) by mouth every 6 (six) hours as needed.  ? Calcium Carb-Cholecalciferol (CALCIUM 600 + D PO) Take by mouth 2 (two) times daily.   ? diclofenac Sodium (VOLTAREN) 1 % GEL Apply 2 g topically 4 (four) times daily.  ? ezetimibe (ZETIA) 10 MG tablet Take 1 tablet (10 mg total) by mouth daily.  ? gabapentin (NEURONTIN) 100 MG capsule Take 1 capsule (100 mg total) by mouth at bedtime. TAKE 1 CAPSULE BY MOUTH THREE TIMES A DAY  ? Glucosamine-Chondroit-Vit C-Mn (GLUCOSAMINE 1500 COMPLEX PO) Take by mouth 2 (two) times daily.   ? glucose blood (ONETOUCH ULTRA) test strip Test BS daily Dx E11.9  ? hydrOXYzine (VISTARIL) 25 MG capsule Take 1 capsule (25 mg total) by mouth 3 (three) times daily as needed.  ? Multiple Vitamins-Minerals (MULTIVITAMIN WITH MINERALS) tablet Take 1 tablet by mouth daily.  ? polyethylene glycol powder (MIRALAX) 17 GM/SCOOP powder Take 17 g by mouth as needed for moderate constipation.  ? ramipril (ALTACE) 10 MG capsule Take 1 capsule (10 mg total) by mouth daily.  ? simvastatin (ZOCOR)  40 MG tablet Take 1 tablet (40 mg total) by mouth at bedtime.  ? tamsulosin (FLOMAX) 0.4 MG CAPS capsule Take 1 capsule (0.4 mg total) by mouth at bedtime.  ? Vibegron (GEMTESA) 75 MG TABS Take 1 tablet by mouth as needed (urinary leakage).  ? EPINEPHrine 0.3 mg/0.3 mL IJ SOAJ injection Inject 0.3 mg into the muscle as needed for anaphylaxis. (Patient not taking: Reported on 10/21/2021)  ? meloxicam (MOBIC) 7.5 MG tablet Take 1 tablet (7.5 mg total) by mouth daily. (Patient not taking: Reported on 10/21/2021)  ?  [DISCONTINUED] Vitamin D, Ergocalciferol, (DRISDOL) 1.25 MG (50000 UNIT) CAPS capsule Take 1 capsule (50,000 Units total) by mouth every 7 (seven) days. (Patient not taking: Reported on 10/21/2021)  ? ?Facility-Administered Encounter Medications as of 10/21/2021  ?Medication  ? cyanocobalamin ((VITAMIN B-12)) injection 1,000 mcg  ? ? ?Allergies (verified) ?Clavulanic acid, Penicillins, Aspirin, Crestor [rosuvastatin calcium], Gabapentin, Hydrocodone, and Lyrica [pregabalin]  ? ?History: ?Past Medical History:  ?Diagnosis Date  ? BPH (benign prostatic hypertrophy)   ? Dr. Rosana Hoes / Dr. Joelyn Oms  - Urologist   ? Cancer [facility])   ? lip-mole surgery  ? Cataract   ? bilateral  ? Colon polyps   ? Constipation   ? occasional - miralax prn  ? DDD (degenerative disc disease)   ? Hearing loss   ? left ear, no hearing aids  ? Hyperlipidemia   ? Hypertension   ? Leukoplakia   ? Microscopic hematuria   ? negative work up with Urology in the past.   ? OAB (overactive bladder)   ? with past percutaneous tibial nerve stimulation therapy  ? Thrombocytopenia ([place])   ? Tinnitus   ? Tobacco abuse   ? Vitamin B 12 deficiency   ? Vitamin D deficiency   ? ?Past Surgical History:  ?Procedure Laterality Date  ? CATARACT EXTRACTION  2007  ? right Hurley  ? CATARACT EXTRACTION Left 03/21/15  ? COLONOSCOPY  02/28/2014  ? HERNIA REPAIR  1980  ? KNEE SURGERY  02-28-2008  ? replaced inside right knee  ? Left Knee Repair  11-10-2006  ? NOSE SURGERY    ? POLYPECTOMY    ? colon polyps  ? SKIN BIOPSY  10-08-2006  ? left ear  ? ?Family History  ?Problem Relation Age of Onset  ? Heart failure Father   ? Glaucoma Mother   ? Osteoporosis Brother   ? Heart disease Brother 8  ?     stent  ? Hypertension Brother   ? Hyperlipidemia Brother   ? Diabetes Brother   ? Colon cancer Neg Hx   ? Stomach cancer Neg Hx   ? Rectal cancer Neg Hx   ? ?Social History  ? ?Socioeconomic History  ? Marital status: Single  ?  Spouse name: Not on file  ? Number of children: 0  ? Years of  education: 10  ? Highest education level: 10th grade  ?Occupational History  ? Occupation: Retired  ?  Comment: retired Immunologist  ?Tobacco Use  ? Smoking status: Former  ?  Packs/day: 1.00  ?  Years: 50.00  ?  Pack years: 50.00  ?  Types: Cigarettes  ?  Quit date: 08/14/2009  ?  Years since quitting: 12.1  ? Smokeless tobacco: Never  ?Vaping Use  ? Vaping Use: Never used  ?Substance and Sexual Activity  ? Alcohol use: No  ? Drug use: No  ? Sexual activity: Not Currently  ?Other Topics Concern  ?  Not on file  ?Social History Narrative  ? Retired Immunologist  ? Lives at home with Brother, Alveta Heimlich  ? ?Social Determinants of Health  ? ?Financial Resource Strain: Low Risk   ? Difficulty of Paying Living Expenses: Not hard at all  ?Food Insecurity: No Food Insecurity  ? Worried About Charity fundraiser in the Last Year: Never true  ? Ran Out of Food in the Last Year: Never true  ?Transportation Needs: No Transportation Needs  ? Lack of Transportation (Medical): No  ? Lack of Transportation (Non-Medical): No  ?Physical Activity: Insufficiently Active  ? Days of Exercise per Week: 7 days  ? Minutes of Exercise per Session: 20 min  ?Stress: Stress Concern Present  ? Feeling of Stress : To some extent  ?Social Connections: Moderately Isolated  ? Frequency of Communication with Friends and Family: More than three times a week  ? Frequency of Social Gatherings with Friends and Family: More than three times a week  ? Attends Religious Services: More than 4 times per year  ? Active Member of Clubs or Organizations: No  ? Attends Archivist Meetings: Never  ? Marital Status: Never married  ? ? ?Tobacco Counseling ?Counseling given: Not Answered ? ? ?Clinical Intake: ? ?Pre-visit preparation completed: Yes ? ?Pain : 0-10 ?Pain Score: 5  ?Pain Type: Chronic pain ?Pain Location: Back ?Pain Orientation: Right, Left ?Pain Radiating Towards: legs ?Pain Descriptors / Indicators: Aching ? ?  ? ?BMI -  recorded: 39.71 ?Nutritional Status: BMI > 30  Obese ?Nutritional Risks: None ?Diabetes: Yes ?CBG done?: No ?Did pt. bring in CBG monitor from home?: No ? ?How often do you need to have someone help you w

## 2021-10-22 MED ORDER — GABAPENTIN 100 MG PO CAPS: 100.0000 mg | ORAL_CAPSULE | Freq: Every day | ORAL | 2 refills | Status: DC

## 2021-10-22 NOTE — Addendum Note (Signed)
Addended by: Antonietta Barcelona D on: 10/22/2021 10:52 AM ? ? Modules accepted: Orders ? ?

## 2021-10-22 NOTE — Telephone Encounter (Signed)
TC from [facility] when script sent over other day, 2 sets of directions sent. Previous directions of TID was not taken out. Corrected script & resent. ?

## 2021-11-08 ENCOUNTER — Ambulatory Visit (INDEPENDENT_AMBULATORY_CARE_PROVIDER_SITE_OTHER): Payer: PPO | Admitting: *Deleted

## 2021-11-08 DIAGNOSIS — E538 Deficiency of other specified B group vitamins: Secondary | ICD-10-CM

## 2021-11-21 ENCOUNTER — Telehealth: Payer: PPO

## 2021-11-25 DIAGNOSIS — R351 Nocturia: Secondary | ICD-10-CM | POA: Diagnosis not present

## 2021-11-25 DIAGNOSIS — N3941 Urge incontinence: Secondary | ICD-10-CM | POA: Diagnosis not present

## 2021-11-25 DIAGNOSIS — N200 Calculus of kidney: Secondary | ICD-10-CM | POA: Diagnosis not present

## 2021-11-25 DIAGNOSIS — N401 Enlarged prostate with lower urinary tract symptoms: Secondary | ICD-10-CM | POA: Diagnosis not present

## 2021-12-10 ENCOUNTER — Ambulatory Visit (INDEPENDENT_AMBULATORY_CARE_PROVIDER_SITE_OTHER): Payer: PPO

## 2021-12-10 DIAGNOSIS — E538 Deficiency of other specified B group vitamins: Secondary | ICD-10-CM | POA: Diagnosis not present

## 2021-12-23 DIAGNOSIS — R0602 Shortness of breath: Secondary | ICD-10-CM | POA: Insufficient documentation

## 2021-12-23 NOTE — Progress Notes (Unsigned)
Cardiology Office Note   Date:  12/25/2021   ID:  Lonnie Hurley, DOB Feb 28, 1941, MRN [id number]  PCP:  Sharion Balloon, FNP  Cardiologist:   None Referring:  Sharion Balloon, FNP  Chief Complaint  Patient presents with   Fatigue       History of Present Illness: Lonnie Hurley is a 81 y.o. male who is referred by Sharion Balloon, FNP for evaluation of shortness of breath.  CT from 2019 and he was noted to have some coronary calcium and aortic atherosclerosis.    Perfusion study did not demonstrate any high risk findings.  Echo demonstrated mild AS.  Since I last saw him he has done okay.  He is doing some water aerobics.  He is very limited by his joints.  He gets around slowly with his cane.  He enjoys the water aerobics.  He has dyspnea with exertion but this has been chronic.  He is not having any new shortness of breath, PND or orthopnea.  He is not having any palpitations, presyncope or syncope.  He is not having any chest pressure, neck or arm discomfort.  He had no weight gain or edema.  He has felt more fatigued   Past Medical History:  Diagnosis Date   BPH (benign prostatic hypertrophy)    Dr. Rosana Hoes / Dr. Joelyn Oms  - Urologist    Cancer [facility])    lip-mole surgery   Cataract    bilateral   Colon polyps    Constipation    occasional - miralax prn   DDD (degenerative disc disease)    Hearing loss    left ear, no hearing aids   Hyperlipidemia    Hypertension    Leukoplakia    Microscopic hematuria    negative work up with Urology in the past.    OAB (overactive bladder)    with past percutaneous tibial nerve stimulation therapy   Thrombocytopenia ([place])    Tinnitus    Tobacco abuse    Vitamin B 12 deficiency    Vitamin D deficiency     Past Surgical History:  Procedure Laterality Date   CATARACT EXTRACTION  2007   right eye   CATARACT EXTRACTION Left 03/21/15   COLONOSCOPY  02/28/2014   [facility]   KNEE SURGERY  02-28-2008   replaced inside  right knee   Left Knee Repair  11-10-2006   NOSE SURGERY     POLYPECTOMY     colon polyps   SKIN BIOPSY  10-08-2006   left ear     Current Outpatient Medications  Medication Sig Dispense Refill   acetaminophen (TYLENOL) 500 MG tablet Take 1 tablet (500 mg total) by mouth every 6 (six) hours as needed. 30 tablet 0   Calcium Carb-Cholecalciferol (CALCIUM 600 + D PO) Take by mouth 2 (two) times daily.      diclofenac Sodium (VOLTAREN) 1 % GEL Apply 2 g topically 4 (four) times daily. 350 g 2   ezetimibe (ZETIA) 10 MG tablet Take 1 tablet (10 mg total) by mouth daily. 90 tablet 1   gabapentin (NEURONTIN) 100 MG capsule Take 1 capsule (100 mg total) by mouth at bedtime. 90 capsule 2   Glucosamine-Chondroit-Vit C-Mn (GLUCOSAMINE 1500 COMPLEX PO) Take by mouth 2 (two) times daily.      Multiple Vitamins-Minerals (MULTIVITAMIN WITH MINERALS) tablet Take 1 tablet by mouth daily.     polyethylene glycol powder (MIRALAX) 17 GM/SCOOP powder Take 17 g by mouth  as needed for moderate constipation. 1700 g 2   ramipril (ALTACE) 10 MG capsule Take 1 capsule (10 mg total) by mouth daily. 90 capsule 4   simvastatin (ZOCOR) 40 MG tablet Take 1 tablet (40 mg total) by mouth at bedtime. 90 tablet 1   tamsulosin (FLOMAX) 0.4 MG CAPS capsule Take 1 capsule (0.4 mg total) by mouth at bedtime. 90 capsule 3   Vibegron (GEMTESA) 75 MG TABS Take 1 tablet by mouth as needed (urinary leakage).     EPINEPHrine 0.3 mg/0.3 mL IJ SOAJ injection Inject 0.3 mg into the muscle as needed for anaphylaxis. (Patient not taking: Reported on 10/21/2021) 1 each 2   glucose blood (ONETOUCH ULTRA) test strip Test BS daily Dx E11.9 100 strip 3   hydrOXYzine (VISTARIL) 25 MG capsule Take 1 capsule (25 mg total) by mouth 3 (three) times daily as needed. (Patient not taking: Reported on 12/25/2021) 90 capsule 4   meloxicam (MOBIC) 7.5 MG tablet Take 1 tablet (7.5 mg total) by mouth daily. (Patient not taking: Reported on 10/21/2021) 20 tablet 1    Current Facility-Administered Medications  Medication Dose Route Frequency Provider Last Rate Last Admin   cyanocobalamin ((VITAMIN B-12)) injection 1,000 mcg  1,000 mcg Intramuscular Q30 days [name] A, FNP   1,000 mcg at 12/10/21 1610    Allergies:   Clavulanic acid, Penicillins, Aspirin, Crestor [rosuvastatin calcium], Gabapentin, Hydrocodone, and Lyrica [pregabalin]    ROS:  Please see the history of present illness.   Otherwise, review of systems are positive for neuropathy.   All other systems are reviewed and negative.    PHYSICAL EXAM: VS:  BP 120/78   Pulse 77   Ht '5\' 6"'$  (1.676 m)   Wt 238 lb (108 kg)   BMI 38.41 kg/m  , BMI Body mass index is 38.41 kg/m. GENERAL:  Well appearing NECK:  No jugular venous distention, waveform within normal limits, carotid upstroke brisk and symmetric, no bruits, no thyromegaly LUNGS:  Clear to auscultation bilaterally CHEST:  Unremarkable HEART:  PMI not displaced or sustained,S1 and S2 within normal limits, no S3, no S4, no clicks, no rubs, 2 out of 6 apical systolic murmur radiating slightly at aortic outflow tract, no diastolic murmurs ABD:  Flat, positive bowel sounds normal in frequency in pitch, no bruits, no rebound, no guarding, no midline pulsatile mass, no hepatomegaly, no splenomegaly EXT:  2 plus pulses throughout, trace edema, no cyanosis no clubbing   EKG:  EKG is  ordered today. The ekg ordered 12/25/2021 demonstrates sinus rhythm, rate 77, axis within normal limits, intervals within normal limits, possible old inferior infarct.  Right bundle branch block   Recent Labs: 08/06/2021: ALT 34; BUN 14; Creatinine, Ser 0.97; Hemoglobin 15.1; Platelets 151; Potassium 4.6; Sodium 143    Lipid Panel    Component Value Date/Time   CHOL 113 10/17/2020 1504   CHOL 120 11/15/2012 0917   TRIG 157 (H) 10/17/2020 1504   TRIG 87 06/23/2013 0908   TRIG 113 11/15/2012 0917   HDL 34 (L) 10/17/2020 1504   HDL 41 06/23/2013 0908    HDL 43 11/15/2012 0917   CHOLHDL 3.3 10/17/2020 1504   LDLCALC 52 10/17/2020 1504   LDLCALC 48 06/23/2013 0908   LDLCALC 54 11/15/2012 0917      Wt Readings from Last 3 Encounters:  12/25/21 238 lb (108 kg)  10/21/21 246 lb (111.6 kg)  08/06/21 246 lb (111.6 kg)      Other studies Reviewed: Additional  studies/ records that were reviewed today include: Labs Review of the above records demonstrates:  Please see elsewhere in the note.     ASSESSMENT AND PLAN:  DOE:   His shortness of breath is baseline.  No change in this compared to last year.  I would not suggest further testing.   AS:   This was mild I do not think this is changed clinically.  No further imaging this year.   HTN: The blood pressure is controlled.  He will continue the meds as listed.   DYSLIPIDEMIA:   LDL was 52 with an HDL of 34.  No change in therapy.  52.  No change in therapy.    LEG PAIN:   He had no evidence of vascular disease on ABIs.  He does have neuropathy.  No change in therapy.  DM: A1c was 6.4 which was better than 7.2 which she was previously.  No change in therapy.   FATIGUE:  I will order a TSH.     RBBB: This is unchanged from previous.   Current medicines are reviewed at length with the patient today.  The patient does not have concerns regarding medicines.  The following changes have been made: None  Labs/ tests ordered today include: None  Orders Placed This Encounter  Procedures   TSH   EKG 12-Lead      Disposition:   FU with me in 12 months.      Signed, Minus Breeding, MD  12/25/2021 2:51 PM    [facility]

## 2021-12-25 ENCOUNTER — Other Ambulatory Visit: Payer: PPO

## 2021-12-25 ENCOUNTER — Other Ambulatory Visit: Payer: Self-pay | Admitting: *Deleted

## 2021-12-25 ENCOUNTER — Encounter: Payer: Self-pay | Admitting: Cardiology

## 2021-12-25 ENCOUNTER — Ambulatory Visit: Payer: PPO | Admitting: Cardiology

## 2021-12-25 ENCOUNTER — Ambulatory Visit: Payer: Self-pay | Admitting: *Deleted

## 2021-12-25 VITALS — BP 120/78 | HR 77 | Ht 66.0 in | Wt 238.0 lb

## 2021-12-25 DIAGNOSIS — R5383 Other fatigue: Secondary | ICD-10-CM

## 2021-12-25 DIAGNOSIS — E785 Hyperlipidemia, unspecified: Secondary | ICD-10-CM | POA: Diagnosis not present

## 2021-12-25 DIAGNOSIS — M79605 Pain in left leg: Secondary | ICD-10-CM | POA: Diagnosis not present

## 2021-12-25 DIAGNOSIS — I10 Essential (primary) hypertension: Secondary | ICD-10-CM

## 2021-12-25 DIAGNOSIS — M79604 Pain in right leg: Secondary | ICD-10-CM

## 2021-12-25 DIAGNOSIS — E118 Type 2 diabetes mellitus with unspecified complications: Secondary | ICD-10-CM | POA: Diagnosis not present

## 2021-12-25 DIAGNOSIS — R0602 Shortness of breath: Secondary | ICD-10-CM | POA: Diagnosis not present

## 2021-12-25 DIAGNOSIS — E1169 Type 2 diabetes mellitus with other specified complication: Secondary | ICD-10-CM

## 2021-12-25 NOTE — Patient Instructions (Signed)
Medication Instructions:  The current medical regimen is effective;  continue present plan and medications.  *If you need a refill on your cardiac medications before your next appointment, please call your pharmacy*   Lab Work: Please have blood work today  (TSH)  If you have labs (blood work) drawn today and your tests are completely normal, you will receive your results only by: [place] (if you have MyChart) OR A paper copy in the mail If you have any lab test that is abnormal or we need to change your treatment, we will call you to review the results.   Follow-Up: At [facility], you and your health needs are our priority.  As part of our continuing mission to provide you with exceptional heart care, we have created designated Provider Care Teams.  These Care Teams include your primary Cardiologist (physician) and Advanced Practice Providers (APPs -  Physician Assistants and Nurse Practitioners) who all work together to provide you with the care you need, when you need it.  We recommend signing up for the patient portal called "MyChart".  Sign up information is provided on this After Visit Summary.  MyChart is used to connect with patients for Virtual Visits (Telemedicine).  Patients are able to view lab/test results, encounter notes, upcoming appointments, etc.  Non-urgent messages can be sent to your provider as well.   To learn more about what you can do with MyChart, go to [email or web address].    Your next appointment:   6 month(s)  The format for your next appointment:   In Person  Provider:   Minus Breeding, MD{   Important Information About Sugar

## 2021-12-25 NOTE — Patient Instructions (Signed)
[name]  I have enjoyed working with you through the Chronic Care Management Program at [place]. Due to program changes and because your medical conditions are stable, I am removing myself from your care team. If you are currently active with another CCM Team Member, you will remain active with them unless they reach out to you with additional information. If you feel that you need RN Care Management services in the future, please talk with your primary care provider to discuss re-engagement with the RN Care Manager.   Thank you for allowing me to participate in your your healthcare journey.  Chong Sicilian, BSN, RN-BC Embedded Chronic Care Manager [facility] / [place] Management Direct Dial: [phone]

## 2021-12-25 NOTE — Chronic Care Management (AMB) (Signed)
  Chronic Care Management   Note  12/25/2021 Name: Lonnie Hurley MRN: [id number] DOB: Nov 09, 1940   Per last RNCM visit note and chart review of recent encounters, patient is stable from a nursing perspective. Removing RN Care Manager from Care Team and closing [place]. If patient is currently engaged with another CCM team member I will forward this encounter to inform them of my case closure. Patient may be eligible for re-engagement with RN Care Manager in the future if necessary and can discuss this with their PCP.  Chong Sicilian, BSN, RN-BC Embedded Chronic Care Manager [facility] / [place] Management Direct Dial: [phone]

## 2021-12-26 LAB — TSH: TSH: 2.39 u[IU]/mL (ref 0.450–4.500)

## 2022-01-06 ENCOUNTER — Ambulatory Visit (INDEPENDENT_AMBULATORY_CARE_PROVIDER_SITE_OTHER): Payer: PPO | Admitting: Licensed Clinical Social Worker

## 2022-01-06 DIAGNOSIS — I10 Essential (primary) hypertension: Secondary | ICD-10-CM

## 2022-01-06 DIAGNOSIS — M479 Spondylosis, unspecified: Secondary | ICD-10-CM

## 2022-01-06 DIAGNOSIS — M5136 Other intervertebral disc degeneration, lumbar region: Secondary | ICD-10-CM

## 2022-01-06 DIAGNOSIS — E1169 Type 2 diabetes mellitus with other specified complication: Secondary | ICD-10-CM

## 2022-01-06 DIAGNOSIS — E785 Hyperlipidemia, unspecified: Secondary | ICD-10-CM

## 2022-01-06 NOTE — Patient Instructions (Addendum)
Visit Information  Patient goals:  Manage emotions. Complete ADLs as able. [place] mobility needs  Timeframe:  Short-Term Goal Priority:  Medium Progress: On Track Start Date:         08/13/21               Expected End Date:         02/06/22                  Follow Up Date  LCSW is discharging client today from [place].     Manage Emotions: Complete ADLs as able ;Manage mobility needs   Why is this important?   When you are stressed, down or upset, your body reacts too.  For example, your blood pressure may get higher; you may have a headache or stomachache.  When your emotions get the best of you, your body's ability to fight off cold and flu gets weak.  These steps will help you manage your emotions.     Patient Coping Skills: Attends scheduled medical appointments Takes medications as prescribed Has some support from his brother, Tyreck Bell  Patient Deficits:  Mobility issues May have some challenges in completing ADls  Patient Goals: In next 30 days,  Patient will attend scheduled medical appointments Will take medications as prescribed Will call RNCM or LCSW as needed for CCM support  Follow Up Plan: LCSW  is discharging client today from [place]. Client agreed to this plan    [name].Ivana Nicastro MSW, [place] [facility] representative [facility] Care Management [phone]

## 2022-01-06 NOTE — Chronic Care Management (AMB) (Signed)
Chronic Care Management    Clinical Social Work Note  01/06/2022 Name: Lonnie Hurley MRN: [id number] DOB: Nov 03, 1940  Lonnie Hurley is a 81 y.o. year old male who is a primary care patient of Sharion Balloon, FNP. The CCM team was consulted to assist the patient with chronic disease management and/or care coordination needs related to: [name] .   Engaged with patient by telephone for follow up visit in response to provider referral for social work chronic care management and care coordination services.   Consent to Services:  The patient was given information about Chronic Care Management services, agreed to services, and gave verbal consent prior to initiation of services.  Please see initial visit note for detailed documentation.   Patient agreed to services and consent obtained.   Assessment: Review of patient past medical history, allergies, medications, and health status, including review of relevant consultants reports was performed today as part of a comprehensive evaluation and provision of chronic care management and care coordination services.     SDOH (Social Determinants of Health) assessments and interventions performed:  SDOH Interventions    Flowsheet Row Most Recent Value  SDOH Interventions   Physical Activity Interventions Other (Comments)  [client uses a cane to help him walk]  Stress Interventions Provide Counseling  [client has stress related to caring for health needs of his brother]  Depression Interventions/Treatment  Counseling        Advanced Directives Status: See [employer]yn[employer] application for related entries.  CCM Care Plan  Allergies  Allergen Reactions   Clavulanic Acid    Penicillins Other (See Comments)    unknown   Aspirin Other (See Comments)    Causes Blood in urine   Crestor [Rosuvastatin Calcium] Other (See Comments)    Myalgia, "joint problem"   Gabapentin Other (See Comments)    Blurry vision Pt able to take in small doses     Hydrocodone Other (See Comments)    Constipation     Lyrica [Pregabalin]     Blurry vision     Outpatient Encounter Medications as of 01/06/2022  Medication Sig   acetaminophen (TYLENOL) 500 MG tablet Take 1 tablet (500 mg total) by mouth every 6 (six) hours as needed.   Calcium Carb-Cholecalciferol (CALCIUM 600 + D PO) Take by mouth 2 (two) times daily.    diclofenac Sodium (VOLTAREN) 1 % GEL Apply 2 g topically 4 (four) times daily.   EPINEPHrine 0.3 mg/0.3 mL IJ SOAJ injection Inject 0.3 mg into the muscle as needed for anaphylaxis. (Patient not taking: Reported on 10/21/2021)   ezetimibe (ZETIA) 10 MG tablet Take 1 tablet (10 mg total) by mouth daily.   gabapentin (NEURONTIN) 100 MG capsule Take 1 capsule (100 mg total) by mouth at bedtime.   Glucosamine-Chondroit-Vit C-Mn (GLUCOSAMINE 1500 COMPLEX PO) Take by mouth 2 (two) times daily.    glucose blood (ONETOUCH ULTRA) test strip Test BS daily Dx E11.9   hydrOXYzine (VISTARIL) 25 MG capsule Take 1 capsule (25 mg total) by mouth 3 (three) times daily as needed. (Patient not taking: Reported on 12/25/2021)   meloxicam (MOBIC) 7.5 MG tablet Take 1 tablet (7.5 mg total) by mouth daily. (Patient not taking: Reported on 10/21/2021)   Multiple Vitamins-Minerals (MULTIVITAMIN WITH MINERALS) tablet Take 1 tablet by mouth daily.   polyethylene glycol powder (MIRALAX) 17 GM/SCOOP powder Take 17 g by mouth as needed for moderate constipation.   ramipril (ALTACE) 10 MG capsule Take 1 capsule (10 mg total) by mouth daily.  simvastatin (ZOCOR) 40 MG tablet Take 1 tablet (40 mg total) by mouth at bedtime.   tamsulosin (FLOMAX) 0.4 MG CAPS capsule Take 1 capsule (0.4 mg total) by mouth at bedtime.   Vibegron (GEMTESA) 75 MG TABS Take 1 tablet by mouth as needed (urinary leakage).   Facility-Administered Encounter Medications as of 01/06/2022  Medication   cyanocobalamin ((VITAMIN B-12)) injection 1,000 mcg    Patient Active Problem List   Diagnosis  Date Noted   SOB (shortness of breath) 12/23/2021   Aortic atherosclerosis ([place]) 08/06/2021   Pain in both thighs 05/15/2021   Allergy to alpha-gal 01/04/2021   Haglund's deformity of right heel 06/27/2019   Spinal stenosis of lumbar region 12/29/2018   Chronic pain of right knee 04/14/2018   Heel pain, chronic, right 03/19/2018   Foot pain, bilateral 03/19/2018   Kidney stone 03/19/2018   Arthritis of back 08/25/2016   Displacement of lumbar intervertebral disc without myelopathy 08/20/2016   Low back pain 08/20/2016   Spondylolisthesis 08/20/2016   Stenosis of intervertebral foramina 08/20/2016   Lumbar radiculopathy 08/20/2016   AAA (abdominal aortic aneurysm) without rupture ([place]) 07/13/2016   Lumbar spondylosis with myelopathy 07/13/2016   Diabetes mellitus ([place]) 78/24/2353   Metabolic syndrome 61/44/3154   Vitamin B 12 deficiency 12/14/2014   Hereditary and idiopathic peripheral neuropathy 10/17/2014   Vitamin D deficiency 06/29/2014   BPH (benign prostatic hyperplasia) 06/29/2014   Hypertension 11/17/2012   Hyperlipidemia 11/17/2012   DDD (degenerative disc disease), lumbar 11/17/2012   Thrombocytopenia ([place])     Conditions to be addressed/monitored: monitor client completion of ADLs and daily tasks  Care Plan : LCSW care plan  Updates made by Katha Cabal, LCSW since 01/06/2022 12:00 AM     Problem: Functional Decline      Goal: Mobility and Function Maintained;Complete ADLs daily as able   Start Date: 08/13/2021  Expected End Date: 02/05/2022  This Visit's Progress: On track  Recent Progress: On track  Priority: Medium  Note:   Current barriers:   Patient in need of assistance with connecting to community resources for possible help in completing his daily ADLs and other daily activities Patient is unable to independently navigate community resource options without care coordination support Mobility issues Pain issues  Clinical Goals:  patient will  work with SW monthly to address concerns related to ADLs completion of client and mobility challenges of client Patient will call LCSW in next 30 days to discuss social isolation issues of client   Clinical Interventions:  Collaboration with Sharion Balloon, FNP regarding development and update of comprehensive plan of care as evidenced by provider attestation and co-signature Discussed client needs with [name]. Reviewed appetite issues. He said he is eating adequately Discussed health needs of his brother, Alveta Heimlich.  Client is concerned over health needs of his brother, Alveta Heimlich.  LCSW and [place] talked about health needs of brother of client Discussed client ambulation. Client said he has occasional knee pain. He walks with use of a cane Provided counseling support for client Reviewed medication procurement of client Discussed client meal provision with [name]. Discussed SW needs of client. Client agreed that he was stable from SW perspective and did not currently have SW needs. He and LCSW agreed for LCSW to discharge client today from [place].  Client was appreciative of LCSW and RN support from CCM program.  Client also was happy that he recently had ramp installed at home where he and his brother  reside. LCSW thanked D[place] for his participation in CCM program support  Patient Coping Skills/Strengths: Completes ADLs daily as able Takes medications as prescribed Attends scheduled medical appointments  Patient Deficits  Some mobility challenges Reduced social contact with others  Patient Goals:   Patient will socialize with others or talk with others 2-3 times weekly in next 30 days Patient will attend scheduled medical appointments in next 30 days -  Follow Up Plan: LCSW is discharging client today from [place]. Client has agreed to this plan.      [name].Yaxiel Minnie MSW, [place] [facility] representative [facility] Care Management [phone]

## 2022-01-10 ENCOUNTER — Ambulatory Visit (INDEPENDENT_AMBULATORY_CARE_PROVIDER_SITE_OTHER): Payer: PPO

## 2022-01-10 DIAGNOSIS — E538 Deficiency of other specified B group vitamins: Secondary | ICD-10-CM | POA: Diagnosis not present

## 2022-01-10 NOTE — Progress Notes (Signed)
Cyanocobalamin injection given to left deltoid.  Patient tolerated well. 

## 2022-01-13 DIAGNOSIS — E1169 Type 2 diabetes mellitus with other specified complication: Secondary | ICD-10-CM

## 2022-01-13 DIAGNOSIS — E785 Hyperlipidemia, unspecified: Secondary | ICD-10-CM | POA: Diagnosis not present

## 2022-01-13 DIAGNOSIS — M479 Spondylosis, unspecified: Secondary | ICD-10-CM

## 2022-01-13 DIAGNOSIS — I10 Essential (primary) hypertension: Secondary | ICD-10-CM

## 2022-02-03 ENCOUNTER — Encounter: Payer: Self-pay | Admitting: Family

## 2022-02-03 ENCOUNTER — Ambulatory Visit (INDEPENDENT_AMBULATORY_CARE_PROVIDER_SITE_OTHER): Payer: PPO | Admitting: Family

## 2022-02-03 VITALS — BP 124/67 | HR 77 | Temp 97.5°F | Ht 66.0 in | Wt 234.0 lb

## 2022-02-03 DIAGNOSIS — E782 Mixed hyperlipidemia: Secondary | ICD-10-CM

## 2022-02-03 DIAGNOSIS — I10 Essential (primary) hypertension: Secondary | ICD-10-CM

## 2022-02-03 DIAGNOSIS — I700 Atherosclerosis of aorta: Secondary | ICD-10-CM

## 2022-02-03 DIAGNOSIS — M51369 Other intervertebral disc degeneration, lumbar region without mention of lumbar back pain or lower extremity pain: Secondary | ICD-10-CM

## 2022-02-03 DIAGNOSIS — N401 Enlarged prostate with lower urinary tract symptoms: Secondary | ICD-10-CM | POA: Diagnosis not present

## 2022-02-03 DIAGNOSIS — M5136 Other intervertebral disc degeneration, lumbar region: Secondary | ICD-10-CM

## 2022-02-03 DIAGNOSIS — E1169 Type 2 diabetes mellitus with other specified complication: Secondary | ICD-10-CM

## 2022-02-03 DIAGNOSIS — E538 Deficiency of other specified B group vitamins: Secondary | ICD-10-CM | POA: Diagnosis not present

## 2022-02-03 DIAGNOSIS — M5441 Lumbago with sciatica, right side: Secondary | ICD-10-CM

## 2022-02-03 DIAGNOSIS — G8929 Other chronic pain: Secondary | ICD-10-CM

## 2022-02-03 DIAGNOSIS — E559 Vitamin D deficiency, unspecified: Secondary | ICD-10-CM

## 2022-02-03 DIAGNOSIS — M5416 Radiculopathy, lumbar region: Secondary | ICD-10-CM | POA: Diagnosis not present

## 2022-02-03 LAB — BAYER DCA HB A1C WAIVED: HB A1C (BAYER DCA - WAIVED): 6.1 % — ABNORMAL HIGH (ref 4.8–5.6)

## 2022-02-03 NOTE — Progress Notes (Signed)
Subjective:    Patient ID: Lonnie Hurley, male    DOB: January 06, 1941, 81 y.o.   MRN: [id number]  Chief Complaint  Patient presents with   Medical Management of Chronic Issues   Pt presents to the office today to chronic follow up. He is followed by Urologists every 6 months for BPH. Followed by Cardiologists annually.  He is followed by Dermatologists every 6 months. He is followed by Ortho for osteoarthritis for bilateral knee pain and getting steroid injections.    He has aortic atherosclerosis and takes Zetia and Zocor.  Hypertension This is a chronic problem. The current episode started more than 1 year ago. The problem has been resolved since onset. The problem is controlled. Associated symptoms include malaise/fatigue. Pertinent negatives include no blurred vision, peripheral edema or shortness of breath. Risk factors for coronary artery disease include diabetes mellitus, dyslipidemia, obesity, male gender and sedentary lifestyle. The current treatment provides moderate improvement.  Diabetes He presents for his follow-up diabetic visit. He has type 2 diabetes mellitus. There are no hypoglycemic associated symptoms. Pertinent negatives for diabetes include no blurred vision and no foot paresthesias. Symptoms are stable. Risk factors for coronary artery disease include diabetes mellitus, dyslipidemia, male sex, hypertension and sedentary lifestyle. He is following a generally healthy diet. His overall blood glucose range is 110-130 mg/dl.  Back Pain This is a chronic problem. The current episode started more than 1 year ago. The problem occurs intermittently. The problem has been waxing and waning since onset. The pain is present in the lumbar spine and gluteal. The quality of the pain is described as aching. The pain is moderate. Risk factors include obesity. He has tried bed rest for the symptoms. The treatment provided mild relief.  Benign Prostatic Hypertrophy This is a chronic problem.  The current episode started more than 1 year ago. Irritative symptoms include nocturia and urgency. Obstructive symptoms include incomplete emptying. Past treatments include tamsulosin. The treatment provided moderate relief.  Hyperlipidemia This is a chronic problem. The current episode started more than 1 year ago. The problem is controlled. Recent lipid tests were reviewed and are normal. Exacerbating diseases include obesity. Pertinent negatives include no shortness of breath. Current antihyperlipidemic treatment includes statins. The current treatment provides moderate improvement of lipids. Risk factors for coronary artery disease include dyslipidemia, diabetes mellitus, male sex, hypertension and a sedentary lifestyle.      Review of Systems  Constitutional:  Positive for malaise/fatigue.  Eyes:  Negative for blurred vision.  Respiratory:  Negative for shortness of breath.   Genitourinary:  Positive for incomplete emptying, nocturia and urgency.  Musculoskeletal:  Positive for back pain.  All other systems reviewed and are negative.      Objective:   Physical Exam Vitals reviewed.  Constitutional:      General: He is not in acute distress.    Appearance: He is well-developed. He is obese.  HENT:     Head: Normocephalic.     Right Ear: Tympanic membrane normal.     Left Ear: Tympanic membrane normal.  Eyes:     General:        Right eye: No discharge.        Left eye: No discharge.     Pupils: Pupils are equal, round, and reactive to light.  Neck:     Thyroid: No thyromegaly.  Cardiovascular:     Rate and Rhythm: Normal rate and regular rhythm.     Heart sounds: Normal heart sounds. No murmur  heard. Pulmonary:     Effort: Pulmonary effort is normal. No respiratory distress.     Breath sounds: Normal breath sounds. No wheezing.  Abdominal:     General: Bowel sounds are normal. There is no distension.     Palpations: Abdomen is soft.     Tenderness: There is no  abdominal tenderness.  Musculoskeletal:        General: No tenderness. Normal range of motion.     Cervical back: Normal range of motion and neck supple.  Skin:    General: Skin is warm and dry.     Findings: No erythema or rash.  Neurological:     Mental Status: He is alert and oriented to person, place, and time.     Cranial Nerves: No cranial nerve deficit.     Deep Tendon Reflexes: Reflexes are normal and symmetric.  Psychiatric:        Behavior: Behavior normal.        Thought Content: Thought content normal.        Judgment: Judgment normal.       BP 124/67   Pulse 77   Temp (!) 97.5 F (36.4 C) (Temporal)   Ht 5' 6"  (1.676 m)   Wt 234 lb (106.1 kg)   SpO2 91%   BMI 37.77 kg/m      Assessment & Plan:   Zaahir Pickney comes in today with chief complaint of Medical Management of Chronic Issues   Diagnosis and orders addressed:  1. Aortic atherosclerosis (HCC) - CMP14+EGFR - CBC with Differential/Platelet  2. Primary hypertension - CMP14+EGFR - CBC with Differential/Platelet  3. Type 2 diabetes mellitus with other specified complication, without long-term current use of insulin (HCC) - CMP14+EGFR - CBC with Differential/Platelet - Bayer DCA Hb A1c Waived - Microalbumin / creatinine urine ratio  4. Lumbar radiculopathy - CMP14+EGFR - CBC with Differential/Platelet  5. DDD (degenerative disc disease), lumbar  - CMP14+EGFR - CBC with Differential/Platelet  6. Benign prostatic hyperplasia with lower urinary tract symptoms, symptom details unspecified - CMP14+EGFR - CBC with Differential/Platelet  7. Mixed hyperlipidemia - CMP14+EGFR - CBC with Differential/Platelet  8. Chronic right-sided low back pain with right-sided sciatica - CMP14+EGFR - CBC with Differential/Platelet  9. Vitamin D deficiency - CMP14+EGFR - CBC with Differential/Platelet - VITAMIN D 25 Hydroxy (Vit-D Deficiency, Fractures)  10. Vitamin B 12 deficiency  -  CMP14+EGFR - CBC with Differential/Platelet - Vitamin B12   Labs pending Health Maintenance reviewed Diet and exercise encouraged  Follow up plan:  6 months    [name], FNP

## 2022-02-03 NOTE — Patient Instructions (Signed)

## 2022-02-04 ENCOUNTER — Other Ambulatory Visit: Payer: Self-pay | Admitting: *Deleted

## 2022-02-04 DIAGNOSIS — E785 Hyperlipidemia, unspecified: Secondary | ICD-10-CM

## 2022-02-04 LAB — CMP14+EGFR
ALT: 20 IU/L (ref 0–44)
AST: 24 IU/L (ref 0–40)
Albumin/Globulin Ratio: 1.5 (ref 1.2–2.2)
Albumin: 4.1 g/dL (ref 3.8–4.8)
Alkaline Phosphatase: 69 IU/L (ref 44–121)
BUN/Creatinine Ratio: 15 (ref 10–24)
BUN: 14 mg/dL (ref 8–27)
Bilirubin Total: 0.8 mg/dL (ref 0.0–1.2)
CO2: 27 mmol/L (ref 20–29)
Calcium: 9.2 mg/dL (ref 8.6–10.2)
Chloride: 104 mmol/L (ref 96–106)
Creatinine, Ser: 0.93 mg/dL (ref 0.76–1.27)
Globulin, Total: 2.7 g/dL (ref 1.5–4.5)
Glucose: 99 mg/dL (ref 70–99)
Potassium: 4 mmol/L (ref 3.5–5.2)
Sodium: 142 mmol/L (ref 134–144)
Total Protein: 6.8 g/dL (ref 6.0–8.5)
eGFR: 84 mL/min/{1.73_m2} (ref >59)

## 2022-02-04 LAB — CBC WITH DIFFERENTIAL/PLATELET
Basophils Absolute: 0.1 10*3/uL (ref 0.0–0.2)
Basos: 1 %
EOS (ABSOLUTE): 0.2 10*3/uL (ref 0.0–0.4)
Eos: 2 %
Hematocrit: 45.3 % (ref 37.5–51.0)
Hemoglobin: 15.3 g/dL (ref 13.0–17.7)
Immature Grans (Abs): 0 10*3/uL (ref 0.0–0.1)
Immature Granulocytes: 0 %
Lymphocytes Absolute: 3.1 10*3/uL (ref 0.7–3.1)
Lymphs: 32 %
MCH: 29.6 pg (ref 26.6–33.0)
MCHC: 33.8 g/dL (ref 31.5–35.7)
MCV: 88 fL (ref 79–97)
Monocytes Absolute: 1 10*3/uL — ABNORMAL HIGH (ref 0.1–0.9)
Monocytes: 11 %
Neutrophils Absolute: 5.2 10*3/uL (ref 1.4–7.0)
Neutrophils: 54 %
Platelets: 153 10*3/uL (ref 150–450)
RBC: 5.17 x10E6/uL (ref 4.14–5.80)
RDW: 12.7 % (ref 11.6–15.4)
WBC: 9.6 10*3/uL (ref 3.4–10.8)

## 2022-02-04 LAB — MICROALBUMIN / CREATININE URINE RATIO
Creatinine, Urine: 165.6 mg/dL
Microalb/Creat Ratio: 36 mg/g creat — ABNORMAL HIGH (ref 0–29)
Microalbumin, Urine: 59 ug/mL

## 2022-02-04 LAB — VITAMIN D 25 HYDROXY (VIT D DEFICIENCY, FRACTURES): Vit D, 25-Hydroxy: 33.4 ng/mL (ref 30.0–100.0)

## 2022-02-04 LAB — VITAMIN B12: Vitamin B-12: 492 pg/mL (ref 232–1245)

## 2022-02-04 MED ORDER — SIMVASTATIN 40 MG PO TABS: 40.0000 mg | ORAL_TABLET | Freq: Every day | ORAL | 1 refills | Status: DC

## 2022-02-04 MED ORDER — EZETIMIBE 10 MG PO TABS: 10.0000 mg | ORAL_TABLET | Freq: Every day | ORAL | 1 refills | Status: DC

## 2022-02-10 ENCOUNTER — Ambulatory Visit (INDEPENDENT_AMBULATORY_CARE_PROVIDER_SITE_OTHER): Payer: PPO | Admitting: *Deleted

## 2022-02-10 DIAGNOSIS — E538 Deficiency of other specified B group vitamins: Secondary | ICD-10-CM | POA: Diagnosis not present

## 2022-02-10 NOTE — Progress Notes (Signed)
Vitamin b12 injection given and patient tolerated well.  

## 2022-03-12 ENCOUNTER — Ambulatory Visit (INDEPENDENT_AMBULATORY_CARE_PROVIDER_SITE_OTHER): Payer: PPO

## 2022-03-12 DIAGNOSIS — Z23 Encounter for immunization: Secondary | ICD-10-CM | POA: Diagnosis not present

## 2022-03-14 ENCOUNTER — Ambulatory Visit (INDEPENDENT_AMBULATORY_CARE_PROVIDER_SITE_OTHER): Payer: PPO | Admitting: *Deleted

## 2022-03-14 DIAGNOSIS — E538 Deficiency of other specified B group vitamins: Secondary | ICD-10-CM

## 2022-03-14 NOTE — Progress Notes (Signed)
Pt in today for B12 shot in left arm. Tolerated well.

## 2022-04-15 ENCOUNTER — Ambulatory Visit (INDEPENDENT_AMBULATORY_CARE_PROVIDER_SITE_OTHER): Payer: PPO

## 2022-04-15 DIAGNOSIS — L821 Other seborrheic keratosis: Secondary | ICD-10-CM | POA: Diagnosis not present

## 2022-04-15 DIAGNOSIS — Z85828 Personal history of other malignant neoplasm of skin: Secondary | ICD-10-CM | POA: Diagnosis not present

## 2022-04-15 DIAGNOSIS — E538 Deficiency of other specified B group vitamins: Secondary | ICD-10-CM

## 2022-04-15 DIAGNOSIS — L570 Actinic keratosis: Secondary | ICD-10-CM | POA: Diagnosis not present

## 2022-04-15 NOTE — Progress Notes (Signed)
Cyanocobalamin injection given to right deltoid.  Patient tolerated well. 

## 2022-05-01 ENCOUNTER — Ambulatory Visit (INDEPENDENT_AMBULATORY_CARE_PROVIDER_SITE_OTHER): Payer: PPO | Admitting: Nurse Practitioner

## 2022-05-01 ENCOUNTER — Encounter: Payer: Self-pay | Admitting: Nurse Practitioner

## 2022-05-01 VITALS — BP 139/73 | HR 74 | Temp 98.1°F | Resp 20 | Ht 66.0 in | Wt 237.0 lb

## 2022-05-01 DIAGNOSIS — J069 Acute upper respiratory infection, unspecified: Secondary | ICD-10-CM

## 2022-05-01 MED ORDER — PREDNISONE 20 MG PO TABS: 40.0000 mg | ORAL_TABLET | Freq: Every day | ORAL | 0 refills | Status: AC

## 2022-05-01 MED ORDER — DOXYCYCLINE HYCLATE 100 MG PO TABS: 100.0000 mg | ORAL_TABLET | Freq: Two times a day (BID) | ORAL | 0 refills | Status: DC

## 2022-05-01 NOTE — Progress Notes (Signed)
Subjective:    Patient ID: Lonnie Hurley, male    DOB: March 03, 1941, 81 y.o.   MRN: [id number]   Chief Complaint: Nasal Congestion   Cough This is a new problem. The current episode started 1 to 4 weeks ago (2 weeks). The problem has been unchanged. The problem occurs hourly. The cough is Productive of sputum. Associated symptoms include nasal congestion. Pertinent negatives include no chest pain, chills, fever, headaches, postnasal drip, rhinorrhea or shortness of breath. Nothing aggravates the symptoms. Treatments tried: nasal spray. The treatment provided mild relief.       Review of Systems  Constitutional:  Negative for chills and fever.  HENT:  Negative for postnasal drip and rhinorrhea.   Respiratory:  Positive for cough. Negative for shortness of breath.   Cardiovascular:  Negative for chest pain.  Neurological:  Negative for headaches.       Objective:   Physical Exam Vitals and nursing note reviewed.  Constitutional:      General: He is not in acute distress.    Appearance: Normal appearance. He is not toxic-appearing.  HENT:     Head: Normocephalic and atraumatic.     Right Ear: Tympanic membrane normal.     Left Ear: Tympanic membrane normal.     Nose: Nose normal.     Mouth/Throat:     Mouth: Mucous membranes are moist.     Pharynx: Oropharynx is clear.  Eyes:     Conjunctiva/sclera: Conjunctivae normal.     Pupils: Pupils are equal, round, and reactive to light.  Cardiovascular:     Rate and Rhythm: Normal rate and regular rhythm.     Pulses: Normal pulses.     Heart sounds: Normal heart sounds.  Pulmonary:     Effort: Pulmonary effort is normal. No respiratory distress.     Breath sounds: Normal breath sounds. No wheezing, rhonchi or rales.  Musculoskeletal:     Cervical back: No tenderness.  Lymphadenopathy:     Cervical: No cervical adenopathy.  Skin:    General: Skin is warm and dry.  Neurological:     General: No focal deficit present.      Mental Status: He is alert and oriented to person, place, and time.  Psychiatric:        Mood and Affect: Mood normal.        Behavior: Behavior normal.      BP 139/73   Pulse 74   Temp 98.1 F (36.7 C) (Temporal)   Resp 20   Ht '5\' 6"'$  (1.676 m)   Wt 237 lb (107.5 kg)   SpO2 92%   BMI 38.25 kg/m       Assessment & Plan:   Lonnie Hurley in today with chief complaint of Nasal Congestion   1. URI with cough and congestion Force fluids Nasal spray Meds as prescribed Meds ordered this encounter  Medications   doxycycline (VIBRA-TABS) 100 MG tablet    Sig: Take 1 tablet (100 mg total) by mouth 2 (two) times daily. 1 po bid    Dispense:  20 tablet    Refill:  0    Order Specific Question:   Supervising Provider    Answer:   Caryl Pina A [[id number]]   predniSONE (DELTASONE) 20 MG tablet    Sig: Take 2 tablets (40 mg total) by mouth daily with breakfast for 5 days. 2 po daily for 5 days    Dispense:  10 tablet    Refill:  0  Order Specific Question:   Supervising Provider    Answer:   Worthy Rancher [[id number]]       The above assessment and management plan was discussed with the patient. The patient verbalized understanding of and has agreed to the management plan. Patient is aware to call the clinic if symptoms persist or worsen. Patient is aware when to return to the clinic for a follow-up visit. Patient educated on when it is appropriate to go to the emergency department.   Collene Leyden, FNP student   Chevis Pretty, Lonnie Hurley

## 2022-05-01 NOTE — Patient Instructions (Signed)
1. Take meds as prescribed 2. Use a cool mist humidifier especially during the winter months and when heat has been humid. 3. Use saline nose sprays frequently 4. Saline irrigations of the nose can be very helpful if done frequently.  * 4X daily for 1 week*  * Use of a nettie pot can be helpful with this. Follow directions with this* 5. Drink plenty of fluids 6. Keep thermostat turn down low 7.For any cough or congestion- delsym or mucinex OTC 8. For fever or aces or pains- take tylenol or ibuprofen appropriate for age and weight.  * for fevers greater than 101 orally you may alternate ibuprofen and tylenol every  3 hours.    

## 2022-05-19 ENCOUNTER — Ambulatory Visit (INDEPENDENT_AMBULATORY_CARE_PROVIDER_SITE_OTHER): Payer: PPO

## 2022-05-19 DIAGNOSIS — E538 Deficiency of other specified B group vitamins: Secondary | ICD-10-CM

## 2022-05-19 NOTE — Progress Notes (Signed)
Cyanocobalamin injection given to left deltoid.  Patient tolerated well. 

## 2022-05-26 DIAGNOSIS — H02132 Senile ectropion of right lower eyelid: Secondary | ICD-10-CM | POA: Diagnosis not present

## 2022-05-26 DIAGNOSIS — E119 Type 2 diabetes mellitus without complications: Secondary | ICD-10-CM | POA: Diagnosis not present

## 2022-05-26 DIAGNOSIS — H26493 Other secondary cataract, bilateral: Secondary | ICD-10-CM | POA: Diagnosis not present

## 2022-05-26 DIAGNOSIS — H43812 Vitreous degeneration, left eye: Secondary | ICD-10-CM | POA: Diagnosis not present

## 2022-05-26 DIAGNOSIS — Z961 Presence of intraocular lens: Secondary | ICD-10-CM | POA: Diagnosis not present

## 2022-05-26 LAB — HM DIABETES EYE EXAM

## 2022-05-28 DIAGNOSIS — N200 Calculus of kidney: Secondary | ICD-10-CM | POA: Diagnosis not present

## 2022-05-28 DIAGNOSIS — N3941 Urge incontinence: Secondary | ICD-10-CM | POA: Diagnosis not present

## 2022-05-28 DIAGNOSIS — R3121 Asymptomatic microscopic hematuria: Secondary | ICD-10-CM | POA: Diagnosis not present

## 2022-05-28 DIAGNOSIS — N3281 Overactive bladder: Secondary | ICD-10-CM | POA: Diagnosis not present

## 2022-05-28 DIAGNOSIS — N401 Enlarged prostate with lower urinary tract symptoms: Secondary | ICD-10-CM | POA: Diagnosis not present

## 2022-05-28 DIAGNOSIS — R351 Nocturia: Secondary | ICD-10-CM | POA: Diagnosis not present

## 2022-06-19 ENCOUNTER — Ambulatory Visit (INDEPENDENT_AMBULATORY_CARE_PROVIDER_SITE_OTHER): Payer: PPO | Admitting: *Deleted

## 2022-06-19 DIAGNOSIS — E538 Deficiency of other specified B group vitamins: Secondary | ICD-10-CM

## 2022-06-19 NOTE — Progress Notes (Signed)
B12 injection given to right deltoid, intramuscular. Patient tolerated well.

## 2022-07-21 ENCOUNTER — Ambulatory Visit (INDEPENDENT_AMBULATORY_CARE_PROVIDER_SITE_OTHER): Payer: PPO

## 2022-07-21 DIAGNOSIS — E538 Deficiency of other specified B group vitamins: Secondary | ICD-10-CM | POA: Diagnosis not present

## 2022-07-21 NOTE — Progress Notes (Signed)
Cyanocobalamin injection given to left deltoid.  Patient tolerated well. 

## 2022-08-07 ENCOUNTER — Ambulatory Visit (INDEPENDENT_AMBULATORY_CARE_PROVIDER_SITE_OTHER): Payer: PPO | Admitting: Family

## 2022-08-07 ENCOUNTER — Encounter: Payer: Self-pay | Admitting: Family

## 2022-08-07 VITALS — BP 125/67 | HR 69 | Temp 97.9°F | Ht 66.0 in | Wt 240.8 lb

## 2022-08-07 DIAGNOSIS — N401 Enlarged prostate with lower urinary tract symptoms: Secondary | ICD-10-CM | POA: Diagnosis not present

## 2022-08-07 DIAGNOSIS — I700 Atherosclerosis of aorta: Secondary | ICD-10-CM | POA: Diagnosis not present

## 2022-08-07 DIAGNOSIS — E785 Hyperlipidemia, unspecified: Secondary | ICD-10-CM

## 2022-08-07 DIAGNOSIS — M5136 Other intervertebral disc degeneration, lumbar region: Secondary | ICD-10-CM | POA: Diagnosis not present

## 2022-08-07 DIAGNOSIS — I10 Essential (primary) hypertension: Secondary | ICD-10-CM | POA: Diagnosis not present

## 2022-08-07 DIAGNOSIS — M79604 Pain in right leg: Secondary | ICD-10-CM

## 2022-08-07 DIAGNOSIS — E538 Deficiency of other specified B group vitamins: Secondary | ICD-10-CM

## 2022-08-07 DIAGNOSIS — Z0001 Encounter for general adult medical examination with abnormal findings: Secondary | ICD-10-CM

## 2022-08-07 DIAGNOSIS — M51369 Other intervertebral disc degeneration, lumbar region without mention of lumbar back pain or lower extremity pain: Secondary | ICD-10-CM

## 2022-08-07 DIAGNOSIS — E1169 Type 2 diabetes mellitus with other specified complication: Secondary | ICD-10-CM

## 2022-08-07 DIAGNOSIS — E782 Mixed hyperlipidemia: Secondary | ICD-10-CM | POA: Diagnosis not present

## 2022-08-07 DIAGNOSIS — Z0000 Encounter for general adult medical examination without abnormal findings: Secondary | ICD-10-CM | POA: Diagnosis not present

## 2022-08-07 DIAGNOSIS — M79605 Pain in left leg: Secondary | ICD-10-CM

## 2022-08-07 LAB — BAYER DCA HB A1C WAIVED: HB A1C (BAYER DCA - WAIVED): 7.1 % — ABNORMAL HIGH (ref 4.8–5.6)

## 2022-08-07 NOTE — Patient Instructions (Signed)
Health Maintenance After Age 80 After age 80, you are at a higher risk for certain long-term diseases and infections as well as injuries from falls. Falls are a major cause of broken bones and head injuries in people who are older than age 80. Getting regular preventive care can help to keep you healthy and well. Preventive care includes getting regular testing and making lifestyle changes as recommended by your health care provider. Talk with your health care provider about: Which screenings and tests you should have. A screening is a test that checks for a disease when you have no symptoms. A diet and exercise plan that is right for you. What should I know about screenings and tests to prevent falls? Screening and testing are the best ways to find a health problem early. Early diagnosis and treatment give you the best chance of managing medical conditions that are common after age 80. Certain conditions and lifestyle choices may make you more likely to have a fall. Your health care provider may recommend: Regular vision checks. Poor vision and conditions such as cataracts can make you more likely to have a fall. If you wear glasses, make sure to get your prescription updated if your vision changes. Medicine review. Work with your health care provider to regularly review all of the medicines you are taking, including over-the-counter medicines. Ask your health care provider about any side effects that may make you more likely to have a fall. Tell your health care provider if any medicines that you take make you feel dizzy or sleepy. Strength and balance checks. Your health care provider may recommend certain tests to check your strength and balance while standing, walking, or changing positions. Foot health exam. Foot pain and numbness, as well as not wearing proper footwear, can make you more likely to have a fall. Screenings, including: Osteoporosis screening. Osteoporosis is a condition that causes  the bones to get weaker and break more easily. Blood pressure screening. Blood pressure changes and medicines to control blood pressure can make you feel dizzy. Depression screening. You may be more likely to have a fall if you have a fear of falling, feel depressed, or feel unable to do activities that you used to do. Alcohol use screening. Using too much alcohol can affect your balance and may make you more likely to have a fall. Follow these instructions at home: Lifestyle Do not drink alcohol if: Your health care provider tells you not to drink. If you drink alcohol: Limit how much you have to: 0-1 drink a day for women. 0-2 drinks a day for men. Know how much alcohol is in your drink. In the [place] one drink equals one 12 oz bottle of beer (355 mL), one 5 oz glass of wine (148 mL), or one 1 oz glass of hard liquor (44 mL). Do not use any products that contain nicotine or tobacco. These products include cigarettes, chewing tobacco, and vaping devices, such as e-cigarettes. If you need help quitting, ask your health care provider. Activity  Follow a regular exercise program to stay fit. This will help you maintain your balance. Ask your health care provider what types of exercise are appropriate for you. If you need a cane or walker, use it as recommended by your health care provider. Wear supportive shoes that have nonskid soles. Safety  Remove any tripping hazards, such as rugs, cords, and clutter. Install safety equipment such as grab bars in bathrooms and safety rails on stairs. Keep rooms and walkways   well-lit. General instructions Talk with your health care provider about your risks for falling. Tell your health care provider if: You fall. Be sure to tell your health care provider about all falls, even ones that seem minor. You feel dizzy, tiredness (fatigue), or off-balance. Take over-the-counter and prescription medicines only as told by your health care provider. These include  supplements. Eat a healthy diet and maintain a healthy weight. A healthy diet includes low-fat dairy products, low-fat (lean) meats, and fiber from whole grains, beans, and lots of fruits and vegetables. Stay current with your vaccines. Schedule regular health, dental, and eye exams. Summary Having a healthy lifestyle and getting preventive care can help to protect your health and wellness after age 80. Screening and testing are the best way to find a health problem early and help you avoid having a fall. Early diagnosis and treatment give you the best chance for managing medical conditions that are more common for people who are older than age 80. Falls are a major cause of broken bones and head injuries in people who are older than age 80. Take precautions to prevent a fall at home. Work with your health care provider to learn what changes you can make to improve your health and wellness and to prevent falls. This information is not intended to replace advice given to you by your health care provider. Make sure you discuss any questions you have with your health care provider. Document Revised: 10/22/2020 Document Reviewed: 10/22/2020 Elsevier Patient Education  2023 Elsevier Inc.  

## 2022-08-07 NOTE — Progress Notes (Signed)
Subjective:    Patient ID: Lonnie Hurley, male    DOB: [birth year], 82 y.o.   MRN: [id number]  Chief Complaint  Patient presents with   Medical Management of Chronic Issues    Pt presents to the office today for CPE and chronic follow up. He is followed by Urologists every 6 months for BPH. Followed by Cardiologists annually.  He is followed by Dermatologists every 6 months. He is followed by Ortho for osteoarthritis for bilateral knee pain and getting steroid injections.    He has aortic atherosclerosis and takes Zetia and Zocor.  Hypertension This is a chronic problem. The current episode started more than 1 year ago. The problem has been resolved since onset. The problem is controlled. Associated symptoms include malaise/fatigue. Pertinent negatives include no blurred vision, peripheral edema or shortness of breath. Risk factors for coronary artery disease include obesity, dyslipidemia, diabetes mellitus and sedentary lifestyle. The current treatment provides moderate improvement.  Diabetes He presents for his follow-up diabetic visit. He has type 2 diabetes mellitus. Associated symptoms include foot paresthesias and weakness. Pertinent negatives for diabetes include no blurred vision. Symptoms are stable. Diabetic complications include peripheral neuropathy. Risk factors for coronary artery disease include dyslipidemia, diabetes mellitus, hypertension, male sex and sedentary lifestyle. He is following a generally unhealthy diet. His overall blood glucose range is 110-130 mg/dl.  Back Pain This is a chronic problem. The current episode started more than 1 year ago. The problem occurs intermittently. The problem has been waxing and waning since onset. The pain is present in the lumbar spine. The quality of the pain is described as aching. The pain is at a severity of 6/10. The pain is moderate. Associated symptoms include weakness. The treatment provided moderate relief.  Benign Prostatic  Hypertrophy This is a chronic problem. The current episode started more than 1 year ago. Irritative symptoms include nocturia (4). Pertinent negatives include no nausea or vomiting. Past treatments include finasteride. The treatment provided moderate relief.  Anemia Presents for follow-up visit. Symptoms include malaise/fatigue.  Hyperlipidemia This is a chronic problem. The current episode started more than 1 year ago. The problem is controlled. Recent lipid tests were reviewed and are normal. Exacerbating diseases include obesity. Pertinent negatives include no shortness of breath. Current antihyperlipidemic treatment includes statins. The current treatment provides moderate improvement of lipids. Risk factors for coronary artery disease include dyslipidemia, diabetes mellitus, hypertension, male sex and a sedentary lifestyle.      Review of Systems  Constitutional:  Positive for malaise/fatigue.  Eyes:  Negative for blurred vision.  Respiratory:  Negative for shortness of breath.   Gastrointestinal:  Negative for nausea and vomiting.  Genitourinary:  Positive for nocturia (4).  Musculoskeletal:  Positive for back pain.  Neurological:  Positive for weakness.  All other systems reviewed and are negative.      Family History  Problem Relation Age of Onset   Heart failure Father    Glaucoma Mother    Osteoporosis Brother    Heart disease Brother 84       stent   Hypertension Brother    Hyperlipidemia Brother    Diabetes Brother    Colon cancer Neg Hx    Stomach cancer Neg Hx    Rectal cancer Neg Hx    Social History   Socioeconomic History   Marital status: Single    Spouse name: Not on file   Number of children: 0   Years of education: 10   Highest education level:  10th grade  Occupational History   Occupation: Retired    Comment: retired Immunologist  Tobacco Use   Smoking status: Former    Packs/day: 1.00    Years: 50.00    Total pack years: 50.00     Types: Cigarettes    Quit date: 08/14/2009    Years since quitting: 12.9   Smokeless tobacco: Never  Vaping Use   Vaping Use: Never used  Substance and Sexual Activity   Alcohol use: No   Drug use: No   Sexual activity: Not Currently  Other Topics Concern   Not on file  Social History Narrative   Retired Immunologist   Lives at home with Brother, Lonnie Hurley   Social Determinants of Health   Financial Resource Strain: [place]  (10/21/2021)   Overall Financial Resource Strain (CARDIA)    Difficulty of Paying Living Expenses: Not hard at all  Food Insecurity: No Food Insecurity (10/21/2021)   Hunger Vital Sign    Worried About Running Out of Food in the Last Year: Never true    [place] in the Last Year: Never true  Transportation Needs: No Transportation Needs (10/21/2021)   PRAPARE - Hydrologist (Medical): No    Lack of Transportation (Non-Medical): No  Physical Activity: Insufficiently Active (01/06/2022)   Exercise Vital Sign    Days of Exercise per Week: 1 day    Minutes of Exercise per Session: 10 min  Stress: Stress Concern Present (01/06/2022)   [place]    Feeling of Stress : To some extent  Social Connections: Moderately Isolated (10/21/2021)   Social Connection and Isolation Panel [NHANES]    Frequency of Communication with Friends and Family: More than three times a week    Frequency of Social Gatherings with Friends and Family: More than three times a week    Attends Religious Services: More than 4 times per year    Active Member of [employer] or Organizations: No    Attends Archivist Meetings: Never    Marital Status: Never married    Objective:   Physical Exam Vitals reviewed.  Constitutional:      General: He is not in acute distress.    Appearance: He is well-developed. He is obese.  HENT:     Head: Normocephalic.     Right Ear: Tympanic  membrane normal.     Left Ear: Tympanic membrane normal.  Eyes:     General:        Right eye: No discharge.        Left eye: No discharge.     Pupils: Pupils are equal, round, and reactive to light.  Neck:     Thyroid: No thyromegaly.  Cardiovascular:     Rate and Rhythm: Normal rate and regular rhythm.     Heart sounds: Normal heart sounds. No murmur heard. Pulmonary:     Effort: Pulmonary effort is normal. No respiratory distress.     Breath sounds: Normal breath sounds. No wheezing.  Abdominal:     General: Bowel sounds are normal. There is no distension.     Palpations: Abdomen is soft.     Tenderness: There is no abdominal tenderness.  Musculoskeletal:        General: No tenderness. Normal range of motion.     Cervical back: Normal range of motion and neck supple.     Right lower leg: Edema (  trace) present.     Left lower leg: Edema (trace) present.  Skin:    General: Skin is warm and dry.     Findings: No erythema or rash.  Neurological:     Mental Status: He is alert and oriented to person, place, and time.     Cranial Nerves: No cranial nerve deficit.     Deep Tendon Reflexes: Reflexes are normal and symmetric.  Psychiatric:        Behavior: Behavior normal.        Thought Content: Thought content normal.        Judgment: Judgment normal.       BP 125/67   Pulse 69   Temp 97.9 F (36.6 C) (Temporal)   Ht 5' 6"$  (1.676 m)   Wt 240 lb 12.8 oz (109.2 kg)   SpO2 94%   BMI 38.87 kg/m      Assessment & Plan:  Lonnie Hurley comes in today with chief complaint of Medical Management of Chronic Issues   Diagnosis and orders addressed:  1. Annual physical exam - CMP14+EGFR - CBC with Differential/Platelet - Bayer DCA Hb A1c Waived - Lipid panel - TSH  2. Aortic atherosclerosis (HCC) - CMP14+EGFR - CBC with Differential/Platelet  3. Benign prostatic hyperplasia with lower urinary tract symptoms, symptom details unspecified - CMP14+EGFR - CBC with  Differential/Platelet  4. DDD (degenerative disc disease), lumbar - CMP14+EGFR - CBC with Differential/Platelet  5. Type 2 diabetes mellitus with other specified complication, without long-term current use of insulin (HCC) - CMP14+EGFR - CBC with Differential/Platelet - Bayer DCA Hb A1c Waived  6. Mixed hyperlipidemia - CMP14+EGFR - CBC with Differential/Platelet - Lipid panel  7. Primary hypertension - CMP14+EGFR - CBC with Differential/Platelet  8. Vitamin B 12 deficiency  - CMP14+EGFR - CBC with Differential/Platelet   Labs pending Continue current medications  Health Maintenance reviewed Diet and exercise encouraged  Follow up plan: 6 months   [name], FNP

## 2022-08-08 LAB — CMP14+EGFR
ALT: 21 IU/L (ref 0–44)
AST: 26 IU/L (ref 0–40)
Albumin/Globulin Ratio: 1.6 (ref 1.2–2.2)
Albumin: 4 g/dL (ref 3.8–4.8)
Alkaline Phosphatase: 65 IU/L (ref 44–121)
BUN/Creatinine Ratio: 11 (ref 10–24)
BUN: 11 mg/dL (ref 8–27)
Bilirubin Total: 0.5 mg/dL (ref 0.0–1.2)
CO2: 30 mmol/L — ABNORMAL HIGH (ref 20–29)
Calcium: 9.6 mg/dL (ref 8.6–10.2)
Chloride: 103 mmol/L (ref 96–106)
Creatinine, Ser: 0.96 mg/dL (ref 0.76–1.27)
Globulin, Total: 2.5 g/dL (ref 1.5–4.5)
Glucose: 97 mg/dL (ref 70–99)
Potassium: 3.9 mmol/L (ref 3.5–5.2)
Sodium: 145 mmol/L — ABNORMAL HIGH (ref 134–144)
Total Protein: 6.5 g/dL (ref 6.0–8.5)
eGFR: 80 mL/min/{1.73_m2} (ref >59)

## 2022-08-08 LAB — CBC WITH DIFFERENTIAL/PLATELET
Basophils Absolute: 0.1 10*3/uL (ref 0.0–0.2)
Basos: 1 %
EOS (ABSOLUTE): 0.3 10*3/uL (ref 0.0–0.4)
Eos: 3 %
Hematocrit: 45.6 % (ref 37.5–51.0)
Hemoglobin: 15.2 g/dL (ref 13.0–17.7)
Immature Grans (Abs): 0 10*3/uL (ref 0.0–0.1)
Immature Granulocytes: 0 %
Lymphocytes Absolute: 3.2 10*3/uL — ABNORMAL HIGH (ref 0.7–3.1)
Lymphs: 34 %
MCH: 29.5 pg (ref 26.6–33.0)
MCHC: 33.3 g/dL (ref 31.5–35.7)
MCV: 89 fL (ref 79–97)
Monocytes Absolute: 1 10*3/uL — ABNORMAL HIGH (ref 0.1–0.9)
Monocytes: 11 %
Neutrophils Absolute: 4.7 10*3/uL (ref 1.4–7.0)
Neutrophils: 51 %
Platelets: 150 10*3/uL (ref 150–450)
RBC: 5.15 x10E6/uL (ref 4.14–5.80)
RDW: 12.9 % (ref 11.6–15.4)
WBC: 9.3 10*3/uL (ref 3.4–10.8)

## 2022-08-08 LAB — LIPID PANEL
Chol/HDL Ratio: 3.2 ratio (ref 0.0–5.0)
Cholesterol, Total: 117 mg/dL (ref 100–199)
HDL: 37 mg/dL — ABNORMAL LOW (ref >39)
LDL Chol Calc (NIH): 53 mg/dL (ref 0–99)
Triglycerides: 156 mg/dL — ABNORMAL HIGH (ref 0–149)
VLDL Cholesterol Cal: 27 mg/dL (ref 5–40)

## 2022-08-08 LAB — TSH: TSH: 2.56 u[IU]/mL (ref 0.450–4.500)

## 2022-08-08 MED ORDER — RAMIPRIL 10 MG PO CAPS: 10.0000 mg | ORAL_CAPSULE | Freq: Every day | ORAL | 4 refills | Status: DC

## 2022-08-08 MED ORDER — EZETIMIBE 10 MG PO TABS: 10.0000 mg | ORAL_TABLET | Freq: Every day | ORAL | 1 refills | Status: DC

## 2022-08-08 MED ORDER — GABAPENTIN 100 MG PO CAPS: 100.0000 mg | ORAL_CAPSULE | Freq: Every day | ORAL | 2 refills | Status: DC

## 2022-08-08 MED ORDER — SIMVASTATIN 40 MG PO TABS: 40.0000 mg | ORAL_TABLET | Freq: Every day | ORAL | 1 refills | Status: DC

## 2022-08-08 NOTE — Addendum Note (Signed)
Addended by: Ladean Raya on: 08/08/2022 10:50 AM   Modules accepted: Orders

## 2022-08-21 ENCOUNTER — Ambulatory Visit (INDEPENDENT_AMBULATORY_CARE_PROVIDER_SITE_OTHER): Payer: PPO

## 2022-08-21 DIAGNOSIS — E538 Deficiency of other specified B group vitamins: Secondary | ICD-10-CM | POA: Diagnosis not present

## 2022-08-21 NOTE — Progress Notes (Signed)
Cyanocobalamin injection given to right deltoid.  Patient tolerated well. 

## 2022-09-22 ENCOUNTER — Ambulatory Visit (INDEPENDENT_AMBULATORY_CARE_PROVIDER_SITE_OTHER): Payer: PPO

## 2022-09-22 DIAGNOSIS — E538 Deficiency of other specified B group vitamins: Secondary | ICD-10-CM | POA: Diagnosis not present

## 2022-09-22 NOTE — Progress Notes (Signed)
Cyanocobalamin injection given to left deltoid.  Patient tolerated well. 

## 2022-10-14 DIAGNOSIS — L570 Actinic keratosis: Secondary | ICD-10-CM | POA: Diagnosis not present

## 2022-10-14 DIAGNOSIS — L309 Dermatitis, unspecified: Secondary | ICD-10-CM | POA: Diagnosis not present

## 2022-10-14 DIAGNOSIS — Z85828 Personal history of other malignant neoplasm of skin: Secondary | ICD-10-CM | POA: Diagnosis not present

## 2022-10-23 ENCOUNTER — Ambulatory Visit (INDEPENDENT_AMBULATORY_CARE_PROVIDER_SITE_OTHER): Payer: PPO

## 2022-10-23 DIAGNOSIS — E538 Deficiency of other specified B group vitamins: Secondary | ICD-10-CM

## 2022-10-23 NOTE — Progress Notes (Signed)
Cyanocobalamin injection given to right deltoid.  Patient tolerated well. 

## 2022-10-30 ENCOUNTER — Ambulatory Visit (INDEPENDENT_AMBULATORY_CARE_PROVIDER_SITE_OTHER): Payer: PPO | Admitting: Nurse Practitioner

## 2022-10-30 ENCOUNTER — Encounter: Payer: Self-pay | Admitting: Nurse Practitioner

## 2022-10-30 VITALS — BP 167/96 | HR 82 | Temp 98.7°F | Resp 20 | Ht 66.0 in | Wt 234.0 lb

## 2022-10-30 DIAGNOSIS — M10042 Idiopathic gout, left hand: Secondary | ICD-10-CM

## 2022-10-30 LAB — ARTHRITIS PANEL
Hemoglobin: 14.8 g/dL (ref 13.0–17.7)
Immature Granulocytes: 0 %
Lymphocytes Absolute: 2.1 10*3/uL (ref 0.7–3.1)
RBC: 5.08 x10E6/uL (ref 4.14–5.80)
RDW: 12.2 % (ref 11.6–15.4)

## 2022-10-30 MED ORDER — PREDNISONE 20 MG PO TABS
40.0000 mg | ORAL_TABLET | Freq: Every day | ORAL | 0 refills | Status: AC
Start: 2022-10-30 — End: 2022-11-04

## 2022-10-30 MED ORDER — METHYLPREDNISOLONE ACETATE 80 MG/ML IJ SUSP
80.0000 mg | Freq: Once | INTRAMUSCULAR | Status: AC
Start: 2022-10-30 — End: 2022-10-30
  Administered 2022-10-30: 80 mg via INTRAMUSCULAR

## 2022-10-30 NOTE — Patient Instructions (Signed)
Gout  Gout is a condition that causes painful swelling of the joints. Gout is a type of inflammation of the joints (arthritis). This condition is caused by having too much uric acid in the body. Uric acid is a chemical that forms when the body breaks down substances called purines. Purines are important for building body proteins. When the body has too much uric acid, sharp crystals can form and build up inside the joints. This causes pain and swelling. Gout attacks can happen quickly and may be very painful (acute gout). Over time, the attacks can affect more joints and become more frequent (chronic gout). Gout can also cause uric acid to build up under the skin and inside the kidneys. What are the causes? This condition is caused by too much uric acid in your blood. This can happen because: Your kidneys do not remove enough uric acid from your blood. This is the most common cause. Your body makes too much uric acid. This can happen with some cancers and cancer treatments. It can also occur if your body is breaking down too many red blood cells (hemolytic anemia). You eat too many foods that are high in purines. These foods include organ meats and some seafood. Alcohol, especially beer, is also high in purines. A gout attack may be triggered by trauma or stress. What increases the risk? The following factors may make you more likely to develop this condition: Having a family history of gout. Being male and middle-aged. Being male and having gone through menopause. Taking certain medicines, including aspirin, cyclosporine, diuretics, levodopa, and niacin. Having an organ transplant. Having certain conditions, such as: Being obese. Lead poisoning. Kidney disease. A skin condition called psoriasis. Other factors include: Losing weight too quickly. Being dehydrated. Frequently drinking alcohol, especially beer. Frequently drinking beverages that are sweetened with a type of sugar called  fructose. What are the signs or symptoms? An attack of acute gout happens quickly. It usually occurs in just one joint. The most common place is the big toe. Attacks often start at night. Other joints that may be affected include joints of the feet, ankle, knee, fingers, wrist, or elbow. Symptoms of this condition may include: Severe pain. Warmth. Swelling. Stiffness. Tenderness. The affected joint may be very painful to touch. Shiny, red, or purple skin. Chills and fever. Chronic gout may cause symptoms more frequently. More joints may be involved. You may also have white or yellow lumps (tophi) on your hands or feet or in other areas near your joints. How is this diagnosed? This condition is diagnosed based on your symptoms, your medical history, and a physical exam. You may have tests, such as: Blood tests to measure uric acid levels. Removal of joint fluid with a thin needle (aspiration) to look for uric acid crystals. X-rays to look for joint damage. How is this treated? Treatment for this condition has two phases: treating an acute attack and preventing future attacks. Acute gout treatment may include medicines to reduce pain and swelling, including: NSAIDs, such as ibuprofen. Steroids. These are strong anti-inflammatory medicines that can be taken by mouth (orally) or injected into a joint. Colchicine. This medicine relieves pain and swelling when it is taken soon after an attack. It can be given by mouth or through an IV. Preventive treatment may include: Daily use of smaller doses of NSAIDs or colchicine. Use of a medicine that reduces uric acid levels in your blood, such as allopurinol. Changes to your diet. You may need to see   a dietitian about what to eat and drink to prevent gout. Follow these instructions at home: During a gout attack  If directed, put ice on the affected area. To do this: Put ice in a plastic bag. Place a towel between your skin and the bag. Leave the  ice on for 20 minutes, 2-3 times a day. Remove the ice if your skin turns bright red. This is very important. If you cannot feel pain, heat, or cold, you have a greater risk of damage to the area. Raise (elevate) the affected joint above the level of your heart as often as possible. Rest the joint as much as possible. If the affected joint is in your leg, you may be given crutches to use. Follow instructions from your health care provider about eating or drinking restrictions. Avoiding future gout attacks Follow a low-purine diet as told by your dietitian or health care provider. Avoid foods and drinks that are high in purines, including liver, kidney, anchovies, asparagus, herring, mushrooms, mussels, and beer. Maintain a healthy weight or lose weight if you are overweight. If you want to lose weight, talk with your health care provider. Do not lose weight too quickly. Start or maintain an exercise program as told by your health care provider. Eating and drinking Avoid drinking beverages that contain fructose. Drink enough fluids to keep your urine pale yellow. If you drink alcohol: Limit how much you have to: 0-1 drink a day for women who are not pregnant. 0-2 drinks a day for men. Know how much alcohol is in a drink. In the [place] one drink equals one 12 oz bottle of beer (355 mL), one 5 oz glass of wine (148 mL), or one 1 oz glass of hard liquor (44 mL). General instructions Take over-the-counter and prescription medicines only as told by your health care provider. Ask your health care provider if the medicine prescribed to you requires you to avoid driving or using machinery. Return to your normal activities as told by your health care provider. Ask your health care provider what activities are safe for you. Keep all follow-up visits. This is important. Where to find more information National [employer] of [employer]: www.niams.nih.gov Contact a health care provider if you have: Another  gout attack. Continuing symptoms of a gout attack after 10 days of treatment. Side effects from your medicines. Chills or a fever. Burning pain when you urinate. Pain in your lower back or abdomen. Get help right away if you: Have severe or uncontrolled pain. Cannot urinate. Summary Gout is painful swelling of the joints caused by having too much uric acid in the body. The most common site for gout to occur is in the big toe, but it can affect other joints in the body. Medicines and dietary changes can help to prevent and treat gout attacks. This information is not intended to replace advice given to you by your health care provider. Make sure you discuss any questions you have with your health care provider. Document Revised: 03/06/2021 Document Reviewed: 03/06/2021 Elsevier Patient Education  2023 [employer]  

## 2022-10-30 NOTE — Progress Notes (Signed)
Subjective:    Patient ID: Lonnie Hurley, male    DOB: 07-Mar-1941, 82 y.o.   MRN: [id number]   Chief Complaint: hand swollen (Left hand. No injury. This has happened several times in the last 10 years)   Patient comes in today with swelling of left thumb. Very painful to touch and hot.    Patient Active Problem List   Diagnosis Date Noted   SOB (shortness of breath) 12/23/2021   Aortic atherosclerosis (HCC) 08/06/2021   Pain in both thighs 05/15/2021   Allergy to alpha-gal 01/04/2021   Haglund's deformity of right heel 06/27/2019   Spinal stenosis of lumbar region 12/29/2018   Chronic pain of right knee 04/14/2018   Heel pain, chronic, right 03/19/2018   Foot pain, bilateral 03/19/2018   Kidney stone 03/19/2018   Arthritis of back 08/25/2016   Displacement of lumbar intervertebral disc without myelopathy 08/20/2016   Low back pain 08/20/2016   Spondylolisthesis 08/20/2016   Stenosis of intervertebral foramina 08/20/2016   Lumbar radiculopathy 08/20/2016   AAA (abdominal aortic aneurysm) without rupture (HCC) 07/13/2016   Lumbar spondylosis with myelopathy 07/13/2016   Diabetes mellitus (HCC) 09/18/2015   Metabolic syndrome 06/15/2015   Vitamin B 12 deficiency 12/14/2014   Hereditary and idiopathic peripheral neuropathy 10/17/2014   Vitamin D deficiency 06/29/2014   BPH (benign prostatic hyperplasia) 06/29/2014   Hypertension 11/17/2012   Hyperlipidemia 11/17/2012   DDD (degenerative disc disease), lumbar 11/17/2012   Thrombocytopenia (HCC)        Review of Systems  Constitutional:  Negative for diaphoresis.  Eyes:  Negative for pain.  Respiratory:  Negative for shortness of breath.   Cardiovascular:  Negative for chest pain, palpitations and leg swelling.  Gastrointestinal:  Negative for abdominal pain.  Endocrine: Negative for polydipsia.  Skin:  Negative for rash.  Neurological:  Negative for dizziness, weakness and headaches.  Hematological:  Does not  bruise/bleed easily.  All other systems reviewed and are negative.      Objective:   Physical Exam Constitutional:      Appearance: Normal appearance.  Cardiovascular:     Rate and Rhythm: Normal rate and regular rhythm.  Pulmonary:     Breath sounds: Normal breath sounds.  Musculoskeletal:     Comments: Left thmb base swollen and sore t the touch. Limited ROM due to pain and swelling.  Neurological:     General: No focal deficit present.     Mental Status: He is alert and oriented to person, place, and time.  Psychiatric:        Mood and Affect: Mood normal.        Behavior: Behavior normal.     BP (!) 167/96   Pulse 82   Temp 98.7 F (37.1 C) (Temporal)   Resp 20   Ht 5\' 6"  (1.676 m)   Wt 234 lb (106.1 kg)   SpO2 95%   BMI 37.77 kg/m   Lab Results  Component Value Date   HGBA1C 7.1 (H) 08/07/2022        Assessment & Plan:   [name] in today with chief complaint of hand swollen (Left hand. No injury. This has happened several times in the last 10 years)   1. Acute idiopathic gout of left hand STRICT low carb diet while taking steroids Ice  Rest RTO prn - Arthritis Panel - predniSONE (DELTASONE) 20 MG tablet; Take 2 tablets (40 mg total) by mouth daily with breakfast for 5 days. 2 po daily for 5  days  Dispense: 10 tablet; Refill: 0 - methylPREDNISolone acetate (DEPO-MEDROL) injection 80 mg    The above assessment and management plan was discussed with the patient. The patient verbalized understanding of and has agreed to the management plan. Patient is aware to call the clinic if symptoms persist or worsen. Patient is aware when to return to the clinic for a follow-up visit. Patient educated on when it is appropriate to go to the emergency department.   Mary-Margaret Daphine Deutscher, FNP

## 2022-10-31 ENCOUNTER — Ambulatory Visit (INDEPENDENT_AMBULATORY_CARE_PROVIDER_SITE_OTHER): Payer: PPO

## 2022-10-31 VITALS — Ht 65.0 in | Wt 234.0 lb

## 2022-10-31 DIAGNOSIS — Z0000 Encounter for general adult medical examination without abnormal findings: Secondary | ICD-10-CM

## 2022-10-31 LAB — ARTHRITIS PANEL
Basophils Absolute: 0.1 10*3/uL (ref 0.0–0.2)
Basos: 1 %
EOS (ABSOLUTE): 0 10*3/uL (ref 0.0–0.4)
Eos: 0 %
Hematocrit: 45.2 % (ref 37.5–51.0)
Immature Grans (Abs): 0 10*3/uL (ref 0.0–0.1)
Lymphs: 19 %
MCH: 29.1 pg (ref 26.6–33.0)
MCHC: 32.7 g/dL (ref 31.5–35.7)
MCV: 89 fL (ref 79–97)
Monocytes Absolute: 1.4 10*3/uL — ABNORMAL HIGH (ref 0.1–0.9)
Monocytes: 13 %
Neutrophils Absolute: 7.3 10*3/uL — ABNORMAL HIGH (ref 1.4–7.0)
Neutrophils: 67 %
Platelets: 154 10*3/uL (ref 150–450)
Sed Rate: 20 mm/hr (ref 0–30)
Uric Acid: 4.5 mg/dL (ref 3.8–8.4)
WBC: 10.9 10*3/uL — ABNORMAL HIGH (ref 3.4–10.8)

## 2022-10-31 MED ORDER — SULFAMETHOXAZOLE-TRIMETHOPRIM 800-160 MG PO TABS: 1.0000 | ORAL_TABLET | Freq: Two times a day (BID) | ORAL | 0 refills | Status: DC

## 2022-10-31 NOTE — Progress Notes (Signed)
Subjective:   Lonnie Hurley is a 82 y.o. male who presents for Medicare Annual/Subsequent preventive examination. I connected with  Lonnie Hurley on 10/31/22 by a audio enabled telemedicine application and verified that I am speaking with the correct person using two identifiers.  Patient Location: Home  Provider Location: Home Office  I discussed the limitations of evaluation and management by telemedicine. The patient expressed understanding and agreed to proceed.  Review of Systems     Cardiac Risk Factors include: advanced age (>74men, >35 women);diabetes mellitus;male gender;dyslipidemia;hypertension     Objective:    Today's Vitals   10/31/22 0853  Weight: 234 lb (106.1 kg)  Height: 5\' 5"  (1.651 m)   Body mass index is 38.94 kg/m.     10/31/2022    8:59 AM 10/21/2021    1:29 PM 10/18/2020    1:32 PM 10/17/2019   11:42 AM 10/12/2019    7:25 PM 10/15/2018    2:28 PM 04/30/2018    6:59 PM  Advanced Directives  Does Patient Have a Medical Advance Directive? Yes Yes Yes Yes Yes No No  Type of Estate agent of [place];Living will Healthcare Power of [place];Living will Healthcare Power of [place];Living will Living will;Healthcare Power of Attorney     Does patient want to make changes to medical advance directive?    No - Patient declined     Copy of Healthcare Power of Attorney in Chart? No - copy requested No - copy requested No - copy requested No - copy requested     Would patient like information on creating a medical advance directive?      No - Patient declined No - Patient declined    Current Medications (verified) Outpatient Encounter Medications as of 10/31/2022  Medication Sig   acetaminophen (TYLENOL) 500 MG tablet Take 1 tablet (500 mg total) by mouth every 6 (six) hours as needed.   Calcium Carb-Cholecalciferol (CALCIUM 600 + D PO) Take by mouth 2 (two) times daily.    diclofenac Sodium (VOLTAREN) 1 % GEL Apply 2 g topically 4 (four)  times daily.   EPINEPHrine 0.3 mg/0.3 mL IJ SOAJ injection Inject 0.3 mg into the muscle as needed for anaphylaxis.   ezetimibe (ZETIA) 10 MG tablet Take 1 tablet (10 mg total) by mouth daily.   gabapentin (NEURONTIN) 100 MG capsule Take 1 capsule (100 mg total) by mouth at bedtime.   Glucosamine-Chondroit-Vit C-Mn (GLUCOSAMINE 1500 COMPLEX PO) Take by mouth 2 (two) times daily.    glucose blood (ONETOUCH ULTRA) test strip Test BS daily Dx E11.9   hydrOXYzine (VISTARIL) 25 MG capsule Take 1 capsule (25 mg total) by mouth 3 (three) times daily as needed.   meloxicam (MOBIC) 7.5 MG tablet Take 1 tablet (7.5 mg total) by mouth daily.   Multiple Vitamins-Minerals (MULTIVITAMIN WITH MINERALS) tablet Take 1 tablet by mouth daily.   polyethylene glycol powder (MIRALAX) 17 GM/SCOOP powder Take 17 g by mouth as needed for moderate constipation.   predniSONE (DELTASONE) 20 MG tablet Take 2 tablets (40 mg total) by mouth daily with breakfast for 5 days. 2 po daily for 5 days   ramipril (ALTACE) 10 MG capsule Take 1 capsule (10 mg total) by mouth daily.   simvastatin (ZOCOR) 40 MG tablet Take 1 tablet (40 mg total) by mouth at bedtime.   tamsulosin (FLOMAX) 0.4 MG CAPS capsule Take 1 capsule (0.4 mg total) by mouth at bedtime.   Vibegron (GEMTESA) 75 MG TABS Take 1 tablet by  mouth as needed (urinary leakage).   Facility-Administered Encounter Medications as of 10/31/2022  Medication   cyanocobalamin ((VITAMIN B-12)) injection 1,000 mcg    Allergies (verified) Clavulanic acid, Penicillins, Aspirin, Crestor [rosuvastatin calcium], Gabapentin, Hydrocodone, and Lyrica [pregabalin]   History: Past Medical History:  Diagnosis Date   BPH (benign prostatic hypertrophy)    Dr. Earlene Plater / Dr. Etta Grandchild  - Urologist    Cancer [facility])    lip-mole surgery   Cataract    bilateral   Colon polyps    Constipation    occasional - miralax prn   DDD (degenerative disc disease)    Hearing loss    left ear, no hearing  aids   Hyperlipidemia    Hypertension    Leukoplakia    Microscopic hematuria    negative work up with Urology in the past.    OAB (overactive bladder)    with past percutaneous tibial nerve stimulation therapy   Thrombocytopenia (HCC)    Tinnitus    Tobacco abuse    Vitamin B 12 deficiency    Vitamin D deficiency    Past Surgical History:  Procedure Laterality Date   CATARACT EXTRACTION  2007   right eye   CATARACT EXTRACTION Left 03/21/15   COLONOSCOPY  02/28/2014   HERNIA REPAIR  1980   KNEE SURGERY  02-28-2008   replaced inside right knee   Left Knee Repair  11-10-2006   NOSE SURGERY     POLYPECTOMY     colon polyps   SKIN BIOPSY  10-08-2006   left ear   Family History  Problem Relation Age of Onset   Heart failure Father    Glaucoma Mother    Osteoporosis Brother    Heart disease Brother 35       stent   Hypertension Brother    Hyperlipidemia Brother    Diabetes Brother    Colon cancer Neg Hx    Stomach cancer Neg Hx    Rectal cancer Neg Hx    Social History   Socioeconomic History   Marital status: Single    Spouse name: Not on file   Number of children: 0   Years of education: 10   Highest education level: 10th grade  Occupational History   Occupation: Retired    Comment: retired Chartered loss adjuster  Tobacco Use   Smoking status: Former    Packs/day: 1.00    Years: 50.00    Additional pack years: 0.00    Total pack years: 50.00    Types: Cigarettes    Quit date: 08/14/2009    Years since quitting: 13.2   Smokeless tobacco: Never  Vaping Use   Vaping Use: Never used  Substance and Sexual Activity   Alcohol use: No   Drug use: No   Sexual activity: Not Currently  Other Topics Concern   Not on file  Social History Narrative   Retired Chartered loss adjuster   Lives at home with Brother, [name]   Social Determinants of Health   Financial Resource Strain: Low Risk  (10/31/2022)   Overall Financial Resource Strain (CARDIA)     Difficulty of Paying Living Expenses: Not hard at all  Food Insecurity: No Food Insecurity (10/31/2022)   Hunger Vital Sign    Worried About Running Out of Food in the Last Year: Never true    Ran Out of Food in the Last Year: Never true  Transportation Needs: No Transportation Needs (10/31/2022)   PRAPARE - Transportation  Lack of Transportation (Medical): No    Lack of Transportation (Non-Medical): No  Physical Activity: Insufficiently Active (10/31/2022)   Exercise Vital Sign    Days of Exercise per Week: 3 days    Minutes of Exercise per Session: 30 min  Stress: No Stress Concern Present (10/31/2022)   [employer] of Occupational Health - Occupational Stress Questionnaire    Feeling of Stress : Not at all  Social Connections: Moderately Isolated (10/31/2022)   Social Connection and Isolation Panel [NHANES]    Frequency of Communication with Friends and Family: More than three times a week    Frequency of Social Gatherings with Friends and Family: More than three times a week    Attends Religious Services: More than 4 times per year    Active Member of [employer] or Organizations: No    Attends Engineer, structural: Never    Marital Status: Never married    Tobacco Counseling Counseling given: Not Answered   Clinical Intake:  Pre-visit preparation completed: Yes  Pain : No/denies pain     Nutritional Risks: None Diabetes: Yes CBG done?: No Did pt. bring in CBG monitor from home?: No  How often do you need to have someone help you when you read instructions, pamphlets, or other written materials from your doctor or pharmacy?: 1 - Never  Diabetic?yes Nutrition Risk Assessment:  Has the patient had any N/V/D within the last 2 months?  No  Does the patient have any non-healing wounds?  No  Has the patient had any unintentional weight loss or weight gain?  No   Diabetes:  Is the patient diabetic?  Yes  If diabetic, was a CBG obtained today?  No  Did the  patient bring in their glucometer from home?  No  How often do you monitor your CBG's? Daily .   Financial Strains and Diabetes Management:  Are you having any financial strains with the device, your supplies or your medication? No .  Does the patient want to be seen by Chronic Care Management for management of their diabetes?  No  Would the patient like to be referred to a Nutritionist or for Diabetic Management?  No   Diabetic Exams:  Diabetic Eye Exam: Completed 05/26/2022 Diabetic Foot Exam: Overdue, Pt has been advised about the importance in completing this exam. Pt is scheduled for diabetic foot exam on next office visit .   Interpreter Needed?: No  Information entered by :: Renie Ora, LPN   Activities of Daily Living    10/31/2022    8:59 AM  In your present state of health, do you have any difficulty performing the following activities:  Hearing? 0  Vision? 0  Difficulty concentrating or making decisions? 0  Walking or climbing stairs? 0  Dressing or bathing? 0  Doing errands, shopping? 0  Preparing Food and eating ? N  Using the Toilet? N  In the past six months, have you accidently leaked urine? N  Do you have problems with loss of bowel control? N  Managing your Medications? N  Managing your Finances? N  Housekeeping or managing your Housekeeping? N    Patient Care Team: Junie Spencer, FNP as PCP - General (Family Medicine) Exie Parody, MD (Hematology and Oncology) Bjorn Pippin, MD as Attending Physician (Urology) [place]ord, Talmadge Coventry, MD as Consulting Physician (Dermatology) Sallye Lat, MD as Consulting Physician (Ophthalmology) Maeola Harman, MD as Consulting Physician (Neurosurgery)  Indicate any recent Medical Services you may have received from  other than [facility] providers in the past year (date may be approximate).     Assessment:   This is a routine wellness examination for [place].  Hearing/Vision screen Vision Screening - Comments::  Wears rx glasses - up to date with routine eye exams with  Dr.Groat   Dietary issues and exercise activities discussed: Current Exercise Habits: The patient does not participate in regular exercise at present, Exercise limited by: orthopedic condition(s)   Goals Addressed             This Visit's Progress    DIET - INCREASE WATER INTAKE         Depression Screen    10/31/2022    8:57 AM 08/07/2022    2:24 PM 05/01/2022    2:11 PM 01/06/2022   10:25 AM 10/21/2021    1:24 PM 08/13/2021   12:51 PM 06/19/2021    4:29 PM  PHQ 2/9 Scores  PHQ - 2 Score 0 1 0 2 2 2 2   PHQ- 9 Score  5 0 4 3 6 5     Fall Risk    10/31/2022    8:56 AM 08/07/2022    2:17 PM 05/01/2022    2:11 PM 10/21/2021    1:15 PM 05/15/2021   11:35 AM  Fall Risk   Falls in the past year? 0 0 0 1 0  Number falls in past yr: 0   1   Injury with Fall? 0   0   Risk for fall due to : No Fall Risks   History of fall(s);Orthopedic patient;Medication side effect;Impaired balance/gait   Follow up Falls prevention discussed   Education provided;Falls prevention discussed Falls evaluation completed    FALL RISK PREVENTION PERTAINING TO THE HOME:  Any stairs in or around the home? No  If so, are there any without handrails? No  Home free of loose throw rugs in walkways, pet beds, electrical cords, etc? Yes  Adequate lighting in your home to reduce risk of falls? Yes   ASSISTIVE DEVICES UTILIZED TO PREVENT FALLS:  Life alert? No  Use of a cane, walker or w/c? Yes  Grab bars in the bathroom? Yes  Shower chair or bench in shower? No  Elevated toilet seat or a handicapped toilet? No       08/31/2017    2:43 PM 08/25/2016    9:21 AM 10/25/2015    9:39 AM  MMSE - Mini Mental State Exam  Orientation to time 5 5 5   Orientation to Place 5 5 5   Registration 3 3 3   Attention/ Calculation 5 5 4   Recall 3 3 3   Language- name 2 objects 2 2 2   Language- repeat 1 1 1   Language- follow 3 step command 3 3 3   Language- read &  follow direction 1 1 1   Write a sentence 1 1 1   Copy design 1 1 0  Total score 30 30 28         10/31/2022    8:59 AM 10/21/2021    1:25 PM 10/17/2019   11:48 AM 10/15/2018    2:29 PM  6CIT Screen  What Year? 0 points 0 points 0 points 0 points  What month? 0 points 0 points 0 points 0 points  What time? 0 points 0 points 0 points 0 points  Count back from 20 0 points 0 points 0 points 0 points  Months in reverse 0 points 0 points 0 points 0 points  Repeat phrase 0 points 4 points  0 points 0 points  Total Score 0 points 4 points 0 points 0 points    Immunizations Immunization History  Administered Date(s) Administered   Fluad Quad(high Dose 65+) 03/07/2019, 03/19/2020, 03/20/2021, 03/12/2022   Influenza, High Dose Seasonal PF 03/13/2016, 03/17/2017, 03/15/2018   Influenza,inj,Quad PF,6+ Mos 03/14/2014, 03/16/2015   PFIZER(Purple Top)SARS-COV-2 Vaccination 08/09/2019, 08/30/2019, 03/22/2020, 10/15/2020   Pneumococcal Conjugate-13 12/29/2013   Pneumococcal Polysaccharide-23 03/16/2009   Rabies, IM 01/10/2013, 01/17/2013, 01/24/2013, 01/31/2013   Respiratory Syncytial Virus Vaccine,Recomb Aduvanted(Arexvy) 04/18/2022   Tdap 01/10/2013   Zoster Recombinat (Shingrix) 09/01/2017, 11/12/2017   Zoster, Live 03/17/2011    TDAP status: Up to date  Flu Vaccine status: Up to date  Pneumococcal vaccine status: Up to date  Covid-19 vaccine status: Completed vaccines  Qualifies for Shingles Vaccine? Yes   Zostavax completed Yes   Shingrix Completed?: Yes  Screening Tests Health Maintenance  Topic Date Due   Lung Cancer Screening  09/18/2018   COVID-19 Vaccine (5 - 2023-24 season) 02/14/2022   DTaP/Tdap/Td (2 - Td or Tdap) 01/11/2023   INFLUENZA VACCINE  01/15/2023   Diabetic kidney evaluation - Urine ACR  02/04/2023   FOOT EXAM  02/04/2023   HEMOGLOBIN A1C  02/05/2023   OPHTHALMOLOGY EXAM  05/27/2023   Diabetic kidney evaluation - eGFR measurement  08/08/2023   Medicare Annual  Wellness (AWV)  10/31/2023   COLONOSCOPY (Pts 45-26yrs Insurance coverage will need to be confirmed)  03/24/2024   Pneumonia Vaccine 26+ Years old  Completed   Hepatitis C Screening  Completed   Zoster Vaccines- Shingrix  Completed   HPV VACCINES  Aged Out    Health Maintenance  Health Maintenance Due  Topic Date Due   Lung Cancer Screening  09/18/2018   COVID-19 Vaccine (5 - 2023-24 season) 02/14/2022    Colorectal cancer screening: No longer required.   Lung Cancer Screening: (Low Dose CT Chest recommended if Age 21-80 years, 30 pack-year currently smoking OR have quit w/in 15years.) does not qualify.   Lung Cancer Screening Referral: n/a  Additional Screening:  Hepatitis C Screening: does not qualify; Completed 06/15/2015 Vision Screening: Recommended annual ophthalmology exams for early detection of glaucoma and other disorders of the eye. Is the patient up to date with their annual eye exam?  Yes  Who is the provider or what is the name of the office in which the patient attends annual eye exams? Dr.groat  If pt is not established with a provider, would they like to be referred to a provider to establish care? No .   Dental Screening: Recommended annual dental exams for proper oral hygiene  Community Resource Referral / Chronic Care Management: CRR required this visit?  No   CCM required this visit?  No      Plan:     I have personally reviewed and noted the following in the patient's chart:   Medical and social history Use of alcohol, tobacco or illicit drugs  Current medications and supplements including opioid prescriptions. Patient is not currently taking opioid prescriptions. Functional ability and status Nutritional status Physical activity Advanced directives List of other physicians Hospitalizations, surgeries, and ER visits in previous 12 months Vitals Screenings to include cognitive, depression, and falls Referrals and appointments  In addition,  I have reviewed and discussed with patient certain preventive protocols, quality metrics, and best practice recommendations. A written personalized care plan for preventive services as well as general preventive health recommendations were provided to patient.     Lorrene Reid,  LPN   [id number]   Nurse Notes: Due TDAp Vaccine

## 2022-10-31 NOTE — Patient Instructions (Signed)
[name] , Thank you for taking time to come for your Medicare Wellness Visit. I appreciate your ongoing commitment to your health goals. Please review the following plan we discussed and let me know if I can assist you in the future.   These are the goals we discussed:  Goals       Client will talk with LCSW in next 30 days to discuss client completion of ADLs and client completion of daily activities (pt-stated)      CARE PLAN ENTRY   Current Barriers:  Pain issues in knees, legs of client with Chronic Diagnoses of AAA, HTN, HLD, DDD Arthritis issues  Clinical Social Work Clinical Goal(s):  LCSW to talk with client in next 30 days to discuss client completion of ADLs and client completion of daily activities  Interventions: Talked with client about social support system (has support from brother) Talked with client about upcoming client appointments  Talked with client about client completion of ADLs Talked with client about CCM program support Encouraged client to talk with RNCM about nursing support for client Talked with client about mobility challenges and ambulation challenges for client Talked with client about meal provision for client (he and his brother cook meals and eat meals together)  Patient Self Care Activities:   Completes ADLs independently Takes medications as prescribed Attends scheduled medical appointments  Patient Self Care Deficits:  Pain issues Mobility challenges Arthritis  Initial goal documentation       DIET - INCREASE WATER INTAKE      Exercise 150 min/wk Moderate Activity      10/17/2019 AWV Goal: Exercise for General Health  Patient will verbalize understanding of the benefits of increased physical activity: Exercising regularly is important. It will improve your overall fitness, flexibility, and endurance. Regular exercise also will improve your overall health. It can help you control your weight, reduce stress, and improve your bone  density. Over the next year, patient will increase physical activity as tolerated with a goal of at least 150 minutes of moderate physical activity per week.  You can tell that you are exercising at a moderate intensity if your heart starts beating faster and you start breathing faster but can still hold a conversation. Moderate-intensity exercise ideas include: Walking 1 mile (1.6 km) in about 15 minutes Biking Hiking Golfing Dancing Water aerobics Patient will verbalize understanding of everyday activities that increase physical activity by providing examples like the following: Yard work, such as: Insurance underwriter Gardening Washing windows or floors Patient will be able to explain general safety guidelines for exercising:  Before you start a new exercise program, talk with your health care provider. Do not exercise so much that you hurt yourself, feel dizzy, or get very short of breath. Wear comfortable clothes and wear shoes with good support. Drink plenty of water while you exercise to prevent dehydration or heat stroke. Work out until your breathing and your heartbeat get faster.       Have 3 meals a day      10/17/2019 AWV Goal: Improved Nutrition/Diet  Patient will verbalize understanding that diet plays an important role in overall health and that a poor diet is a risk factor for many chronic medical conditions.  Over the next year, patient will improve self management of their diet by incorporating improved meal pattern, decreased fat intake, more consistent meal timing, fewer sweetened foods & beverages, increased physical activity,  improved protein intake, adequate fluid intake (at least 6 cups of fluid per day), watch portion sizes/amount of food eaten at one time, and eat 6 small meals per day. Patient will utilize available community resources to help with food acquisition if needed (ex: food  pantries, [address], etc) Patient will work with nutrition specialist if a referral was made       Manage My Emotions; Complete ADLs as able; Manage mobility needs      Timeframe:  Short-Term Goal Priority:  Medium Progress: On Track Start Date:         08/13/21               Expected End Date:         02/06/22                  Follow Up Date  LCSW is discharging client today from CCM SW services.     Manage Emotions: Complete ADLs as able ;Manage mobility needs   Why is this important?   When you are stressed, down or upset, your body reacts too.  For example, your blood pressure may get higher; you may have a headache or stomachache.  When your emotions get the best of you, your body's ability to fight off cold and flu gets weak.  These steps will help you manage your emotions.     Patient Coping Skills: Attends scheduled medical appointments Takes medications as prescribed Has some support from his brother, Duncan Bloyer  Patient Deficits:  Mobility issues May have some challenges in completing ADls  Patient Goals: In next 30 days,  Patient will attend scheduled medical appointments Will take medications as prescribed Will call RNCM or LCSW as needed for CCM support  Follow Up Plan: LCSW  is discharging client today from CCM SW services. Client agreed to this plan        Mobility and Function Maintained: Complete ADLs daily as able      Evidence-based guidance:  Encourage self-care to promote maximum independence in activities of daily living and to minimize decline associated with disease process and inactivity.  Screen for fall risk, especially as kidney function worsens; encourage modifications in home or work environment to facilitate safe activity.  Prepare patient for referral to physical therapy or occupational therapy for functional mobility, safety assessment, individualized program to retrain mobility, gait or transfers using assistive device.  Evaluate and address  body systems and performance deficits, such as strength, range of motion, balance and activity tolerance impairment.  Consider a muscle strengthening and exercise program, such as progressive resistive exercise and cardiovascular exercise.  Promote incremental change to achieve regular, long-term exercise that is structured and individualized to avoid adverse events.   Encourage use of energy conservation techniques, such as preplanning and pacing activity, balancing activity with periods of rest and positioning for optimal function.  Assess cognition and address impairments with interventions, such as orientation cues in the environment, memory aids or compensatory techniques; individualize patient education to cognitive ability.   Notes:       Weight (lb) < 225 lb (102.1 kg)      Recommend goal is weight loss of 10% of weight as initial goal.         This is a list of the screening recommended for you and due dates:  Health Maintenance  Topic Date Due   Screening for Lung Cancer  09/18/2018   COVID-19 Vaccine (5 - 2023-24 season) 02/14/2022   DTaP/Tdap/Td  vaccine (2 - Td or Tdap) 01/11/2023   Flu Shot  01/15/2023   Yearly kidney health urinalysis for diabetes  02/04/2023   Complete foot exam   02/04/2023   Hemoglobin A1C  02/05/2023   Eye exam for diabetics  05/27/2023   Yearly kidney function blood test for diabetes  08/08/2023   Medicare Annual Wellness Visit  10/31/2023   Colon Cancer Screening  03/24/2024   Pneumonia Vaccine  Completed   Hepatitis C Screening: USPSTF Recommendation to screen - Ages 58-79 yo.  Completed   Zoster (Shingles) Vaccine  Completed   HPV Vaccine  Aged Out    Advanced directives: Advance directive discussed with you today. I have provided a copy for you to complete at home and have notarized. Once this is complete please bring a copy in to our office so we can scan it into your chart.   Conditions/risks identified: Aim for 30 minutes of exercise or  brisk walking, 6-8 glasses of water, and 5 servings of fruits and vegetables each day.   Next appointment: Follow up in one year for your annual wellness visit.   Preventive Care 51 Years and Older, Male  Preventive care refers to lifestyle choices and visits with your health care provider that can promote health and wellness. What does preventive care include? A yearly physical exam. This is also called an annual well check. Dental exams once or twice a year. Routine eye exams. Ask your health care provider how often you should have your eyes checked. Personal lifestyle choices, including: Daily care of your teeth and gums. Regular physical activity. Eating a healthy diet. Avoiding tobacco and drug use. Limiting alcohol use. Practicing safe sex. Taking low doses of aspirin every day. Taking vitamin and mineral supplements as recommended by your health care provider. What happens during an annual well check? The services and screenings done by your health care provider during your annual well check will depend on your age, overall health, lifestyle risk factors, and family history of disease. Counseling  Your health care provider may ask you questions about your: Alcohol use. Tobacco use. Drug use. Emotional well-being. Home and relationship well-being. Sexual activity. Eating habits. History of falls. Memory and ability to understand (cognition). Work and work Astronomer. Screening  You may have the following tests or measurements: Height, weight, and BMI. Blood pressure. Lipid and cholesterol levels. These may be checked every 5 years, or more frequently if you are over 83 years old. Skin check. Lung cancer screening. You may have this screening every year starting at age 67 if you have a 30-pack-year history of smoking and currently smoke or have quit within the past 15 years. Fecal occult blood test (FOBT) of the stool. You may have this test every year starting at age  87. Flexible sigmoidoscopy or colonoscopy. You may have a sigmoidoscopy every 5 years or a colonoscopy every 10 years starting at age 13. Prostate cancer screening. Recommendations will vary depending on your family history and other risks. Hepatitis C blood test. Hepatitis B blood test. Sexually transmitted disease (STD) testing. Diabetes screening. This is done by checking your blood sugar (glucose) after you have not eaten for a while (fasting). You may have this done every 1-3 years. Abdominal aortic aneurysm (AAA) screening. You may need this if you are a current or former smoker. Osteoporosis. You may be screened starting at age 34 if you are at high risk. Talk with your health care provider about your test results, treatment options,  and if necessary, the need for more tests. Vaccines  Your health care provider may recommend certain vaccines, such as: Influenza vaccine. This is recommended every year. Tetanus, diphtheria, and acellular pertussis (Tdap, Td) vaccine. You may need a Td booster every 10 years. Zoster vaccine. You may need this after age 9. Pneumococcal 13-valent conjugate (PCV13) vaccine. One dose is recommended after age 55. Pneumococcal polysaccharide (PPSV23) vaccine. One dose is recommended after age 7. Talk to your health care provider about which screenings and vaccines you need and how often you need them. This information is not intended to replace advice given to you by your health care provider. Make sure you discuss any questions you have with your health care provider. Document Released: 06/29/2015 Document Revised: 02/20/2016 Document Reviewed: 04/03/2015 Elsevier Interactive Patient Education  2017 [employer].  Fall Prevention in the Home Falls can cause injuries. They can happen to people of all ages. There are many things you can do to make your home safe and to help prevent falls. What can I do on the outside of my home? Regularly fix the edges of  walkways and driveways and fix any cracks. Remove anything that might make you trip as you walk through a door, such as a raised step or threshold. Trim any bushes or trees on the path to your home. Use bright outdoor lighting. Clear any walking paths of anything that might make someone trip, such as rocks or tools. Regularly check to see if handrails are loose or broken. Make sure that both sides of any steps have handrails. Any raised decks and porches should have guardrails on the edges. Have any leaves, snow, or ice cleared regularly. Use sand or salt on walking paths during winter. Clean up any spills in your garage right away. This includes oil or grease spills. What can I do in the bathroom? Use night lights. Install grab bars by the toilet and in the tub and shower. Do not use towel bars as grab bars. Use non-skid mats or decals in the tub or shower. If you need to sit down in the shower, use a plastic, non-slip stool. Keep the floor dry. Clean up any water that spills on the floor as soon as it happens. Remove soap buildup in the tub or shower regularly. Attach bath mats securely with double-sided non-slip rug tape. Do not have throw rugs and other things on the floor that can make you trip. What can I do in the bedroom? Use night lights. Make sure that you have a light by your bed that is easy to reach. Do not use any sheets or blankets that are too big for your bed. They should not hang down onto the floor. Have a firm chair that has side arms. You can use this for support while you get dressed. Do not have throw rugs and other things on the floor that can make you trip. What can I do in the kitchen? Clean up any spills right away. Avoid walking on wet floors. Keep items that you use a lot in easy-to-reach places. If you need to reach something above you, use a strong step stool that has a grab bar. Keep electrical cords out of the way. Do not use floor polish or wax that  makes floors slippery. If you must use wax, use non-skid floor wax. Do not have throw rugs and other things on the floor that can make you trip. What can I do with my stairs? Do not leave  any items on the stairs. Make sure that there are handrails on both sides of the stairs and use them. Fix handrails that are broken or loose. Make sure that handrails are as long as the stairways. Check any carpeting to make sure that it is firmly attached to the stairs. Fix any carpet that is loose or worn. Avoid having throw rugs at the top or bottom of the stairs. If you do have throw rugs, attach them to the floor with carpet tape. Make sure that you have a light switch at the top of the stairs and the bottom of the stairs. If you do not have them, ask someone to add them for you. What else can I do to help prevent falls? Wear shoes that: Do not have high heels. Have rubber bottoms. Are comfortable and fit you well. Are closed at the toe. Do not wear sandals. If you use a stepladder: Make sure that it is fully opened. Do not climb a closed stepladder. Make sure that both sides of the stepladder are locked into place. Ask someone to hold it for you, if possible. Clearly mark and make sure that you can see: Any grab bars or handrails. First and last steps. Where the edge of each step is. Use tools that help you move around (mobility aids) if they are needed. These include: Canes. Walkers. Scooters. Crutches. Turn on the lights when you go into a dark area. Replace any light bulbs as soon as they burn out. Set up your furniture so you have a clear path. Avoid moving your furniture around. If any of your floors are uneven, fix them. If there are any pets around you, be aware of where they are. Review your medicines with your doctor. Some medicines can make you feel dizzy. This can increase your chance of falling. Ask your doctor what other things that you can do to help prevent falls. This  information is not intended to replace advice given to you by your health care provider. Make sure you discuss any questions you have with your health care provider. Document Released: 03/29/2009 Document Revised: 11/08/2015 Document Reviewed: 07/07/2014 Elsevier Interactive Patient Education  2017 [employer].

## 2022-10-31 NOTE — Addendum Note (Signed)
Addended by: Bennie Pierini on: 10/31/2022 01:33 PM   Modules accepted: Orders

## 2022-11-24 ENCOUNTER — Ambulatory Visit (INDEPENDENT_AMBULATORY_CARE_PROVIDER_SITE_OTHER): Payer: PPO

## 2022-11-24 DIAGNOSIS — E538 Deficiency of other specified B group vitamins: Secondary | ICD-10-CM | POA: Diagnosis not present

## 2022-11-24 NOTE — Progress Notes (Signed)
Cyanocobalamin injection given left deltoid pt tolerated well.

## 2022-12-24 ENCOUNTER — Ambulatory Visit (INDEPENDENT_AMBULATORY_CARE_PROVIDER_SITE_OTHER): Payer: PPO | Admitting: *Deleted

## 2022-12-24 DIAGNOSIS — E538 Deficiency of other specified B group vitamins: Secondary | ICD-10-CM

## 2022-12-24 MED ORDER — ONETOUCH ULTRA VI STRP: ORAL_STRIP | 3 refills | Status: DC

## 2022-12-24 NOTE — Progress Notes (Signed)
B12 given right deltoid, pt tolerated well 

## 2023-01-05 ENCOUNTER — Ambulatory Visit (INDEPENDENT_AMBULATORY_CARE_PROVIDER_SITE_OTHER): Payer: PPO | Admitting: Family

## 2023-01-05 ENCOUNTER — Ambulatory Visit (INDEPENDENT_AMBULATORY_CARE_PROVIDER_SITE_OTHER): Payer: PPO

## 2023-01-05 ENCOUNTER — Encounter: Payer: Self-pay | Admitting: Family

## 2023-01-05 VITALS — BP 120/68 | HR 71 | Temp 97.8°F | Ht 65.0 in | Wt 236.4 lb

## 2023-01-05 DIAGNOSIS — M170 Bilateral primary osteoarthritis of knee: Secondary | ICD-10-CM | POA: Diagnosis not present

## 2023-01-05 DIAGNOSIS — E1169 Type 2 diabetes mellitus with other specified complication: Secondary | ICD-10-CM | POA: Diagnosis not present

## 2023-01-05 DIAGNOSIS — M25562 Pain in left knee: Secondary | ICD-10-CM | POA: Diagnosis not present

## 2023-01-05 DIAGNOSIS — M25561 Pain in right knee: Secondary | ICD-10-CM | POA: Diagnosis not present

## 2023-01-05 DIAGNOSIS — Z96652 Presence of left artificial knee joint: Secondary | ICD-10-CM | POA: Diagnosis not present

## 2023-01-05 DIAGNOSIS — G8929 Other chronic pain: Secondary | ICD-10-CM

## 2023-01-05 DIAGNOSIS — M25462 Effusion, left knee: Secondary | ICD-10-CM | POA: Diagnosis not present

## 2023-01-05 DIAGNOSIS — M1712 Unilateral primary osteoarthritis, left knee: Secondary | ICD-10-CM | POA: Diagnosis not present

## 2023-01-05 NOTE — Patient Instructions (Signed)

## 2023-01-05 NOTE — Progress Notes (Signed)
Subjective:    Patient ID: Lonnie Hurley, male    DOB: 1940-10-06, 82 y.o.   MRN: [id number]  Chief Complaint  Patient presents with   Knee Pain    Bil knee pain left wrose    Pt presents to the office today with bilateral knee pain that is worse in left. Denies any injury.  Knee Pain  The incident occurred more than 1 week ago. There was no injury mechanism. The pain is present in the right knee and left knee. The quality of the pain is described as aching. The pain is at a severity of 10/10. The pain is moderate. The pain has been Intermittent since onset. He reports no foreign bodies present. The symptoms are aggravated by weight bearing and movement. He has tried acetaminophen, NSAIDs and rest for the symptoms. The treatment provided moderate relief.  Diabetes He has type 2 diabetes mellitus. Pertinent negatives for diabetes include no blurred vision. Symptoms are stable. Risk factors for coronary artery disease include dyslipidemia, diabetes mellitus, hypertension and sedentary lifestyle. His overall blood glucose range is 110-130 mg/dl.      Review of Systems  Eyes:  Negative for blurred vision.  All other systems reviewed and are negative.      Objective:   Physical Exam Vitals reviewed.  Constitutional:      General: He is not in acute distress.    Appearance: He is well-developed.  HENT:     Head: Normocephalic.     Right Ear: Tympanic membrane normal.     Left Ear: Tympanic membrane normal.  Eyes:     General:        Right eye: No discharge.        Left eye: No discharge.     Pupils: Pupils are equal, round, and reactive to light.  Neck:     Thyroid: No thyromegaly.  Cardiovascular:     Rate and Rhythm: Normal rate and regular rhythm.     Heart sounds: Normal heart sounds. No murmur heard. Pulmonary:     Effort: Pulmonary effort is normal. No respiratory distress.     Breath sounds: Normal breath sounds. No wheezing.  Abdominal:     General: Bowel sounds  are normal. There is no distension.     Palpations: Abdomen is soft.     Tenderness: There is no abdominal tenderness.  Musculoskeletal:        General: Tenderness present.     Cervical back: Normal range of motion and neck supple.     Comments: Pain in left knee with flexion and extension  Skin:    General: Skin is warm and dry.     Findings: No erythema or rash.  Neurological:     Mental Status: He is alert and oriented to person, place, and time.     Cranial Nerves: No cranial nerve deficit.     Deep Tendon Reflexes: Reflexes are normal and symmetric.  Psychiatric:        Behavior: Behavior normal.        Thought Content: Thought content normal.        Judgment: Judgment normal.       BP 120/68   Pulse 71   Temp 97.8 F (36.6 C) (Temporal)   Ht 5\' 5"  (1.651 m)   Wt 236 lb 6.4 oz (107.2 kg)   SpO2 94%   BMI 39.34 kg/m      Assessment & Plan:  Lonnie Hurley comes in today with chief complaint of  Knee Pain (Bil knee pain left wrose )   Diagnosis and orders addressed:  1. Chronic pain of right knee - DG Knee 1-2 Views Left - Ambulatory referral to Orthopedic Surgery  2. Chronic pain of left knee - DG Knee 1-2 Views Left - Ambulatory referral to Orthopedic Surgery  3. Primary osteoarthritis of both knees - DG Knee 1-2 Views Left - Ambulatory referral to Orthopedic Surgery  4. Type 2 diabetes mellitus with other specified complication, without long-term current use of insulin (HCC)   Will hold off on injection today as patient wants gel injections.  Referral to Ortho pending  Continue Tylenol  Follow up as needed    Jannifer Rodney, FNP

## 2023-01-06 ENCOUNTER — Telehealth: Payer: Self-pay | Admitting: Family

## 2023-01-06 NOTE — Telephone Encounter (Signed)
REFERRAL REQUEST Telephone Note  Have you been seen at our office for this problem? yes (Advise that they may need an appointment with their PCP before a referral can be done)  Reason for Referral: knee pain  Referral discussed with patient: yes  Best contact number of patient for referral team: [phone]    Has patient been seen by a specialist for this issue before: pt was seen at [facility] ortho  Patient provider preference for referral: [facility]/ Claudette Laws / [address] Patient location preference for referral: [address] [phone]   Patient notified that referrals can take up to a week or longer to process. If they haven't heard anything within a week they should call back and speak with the referral department.

## 2023-01-07 NOTE — Telephone Encounter (Signed)
Patient's referral has been sent as Patient requested.

## 2023-01-09 ENCOUNTER — Encounter: Payer: Self-pay | Admitting: Physical Medicine and Rehabilitation

## 2023-01-12 DIAGNOSIS — H43812 Vitreous degeneration, left eye: Secondary | ICD-10-CM | POA: Diagnosis not present

## 2023-01-12 DIAGNOSIS — H0014 Chalazion left upper eyelid: Secondary | ICD-10-CM | POA: Diagnosis not present

## 2023-01-12 DIAGNOSIS — E119 Type 2 diabetes mellitus without complications: Secondary | ICD-10-CM | POA: Diagnosis not present

## 2023-01-12 DIAGNOSIS — H26493 Other secondary cataract, bilateral: Secondary | ICD-10-CM | POA: Diagnosis not present

## 2023-01-12 DIAGNOSIS — H02132 Senile ectropion of right lower eyelid: Secondary | ICD-10-CM | POA: Diagnosis not present

## 2023-01-12 DIAGNOSIS — Z961 Presence of intraocular lens: Secondary | ICD-10-CM | POA: Diagnosis not present

## 2023-01-26 ENCOUNTER — Ambulatory Visit (INDEPENDENT_AMBULATORY_CARE_PROVIDER_SITE_OTHER): Payer: PPO | Admitting: *Deleted

## 2023-01-26 DIAGNOSIS — E538 Deficiency of other specified B group vitamins: Secondary | ICD-10-CM | POA: Diagnosis not present

## 2023-01-26 NOTE — Progress Notes (Signed)
B12 injection given left deltoid. Pt tolerated well.

## 2023-01-28 ENCOUNTER — Ambulatory Visit: Admission: RE | Admit: 2023-01-28 | Payer: PPO | Source: Ambulatory Visit

## 2023-01-28 ENCOUNTER — Encounter: Payer: PPO | Attending: Physical Medicine and Rehabilitation | Admitting: Physical Medicine & Rehabilitation

## 2023-01-28 ENCOUNTER — Encounter: Payer: Self-pay | Admitting: Physical Medicine & Rehabilitation

## 2023-01-28 VITALS — BP 130/84 | HR 66 | Ht 65.0 in | Wt 238.0 lb

## 2023-01-28 DIAGNOSIS — M5416 Radiculopathy, lumbar region: Secondary | ICD-10-CM | POA: Insufficient documentation

## 2023-01-28 DIAGNOSIS — M1711 Unilateral primary osteoarthritis, right knee: Secondary | ICD-10-CM | POA: Diagnosis not present

## 2023-01-28 DIAGNOSIS — M25562 Pain in left knee: Secondary | ICD-10-CM | POA: Insufficient documentation

## 2023-01-28 DIAGNOSIS — G8929 Other chronic pain: Secondary | ICD-10-CM | POA: Insufficient documentation

## 2023-01-28 DIAGNOSIS — M25561 Pain in right knee: Secondary | ICD-10-CM | POA: Diagnosis not present

## 2023-01-28 DIAGNOSIS — G609 Hereditary and idiopathic neuropathy, unspecified: Secondary | ICD-10-CM | POA: Diagnosis not present

## 2023-01-28 MED ORDER — DICLOFENAC SODIUM 50 MG PO TBEC
50.0000 mg | DELAYED_RELEASE_TABLET | Freq: Two times a day (BID) | ORAL | 3 refills | Status: DC
Start: 2023-01-28 — End: 2023-04-15

## 2023-01-28 NOTE — Progress Notes (Signed)
Subjective:    Patient ID: Lonnie Hurley, male    DOB: Nov 10, 1940, 82 y.o.   MRN: [id number]  HPI  Mr. Wacek today is here for initial evaluation of bilateral knee pain.  He has a history of left medial compartmental partial knee replacement at least 15 years at [facility]. He retired after the surgery. 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 His left knee gives him the most problems but his right knee is also starting to be a bigger problem. I reviewed the most recent xray from a couple weeks ago which revealed the following:  1. Small knee joint effusion. Otherwise, no definite acute findings. 2. Sequela of previous medial unicompartmental left knee joint replacement without definitive evidence of hardware failure or loosening though evaluation degraded due to obliquity. 3. Severe degenerative change of the lateral compartment and patellofemoral joints.   His legs will give out on him frequently after he's been standing or walking for awhile.   L2-L3: Broad-based disc bulge. Mild bilateral facet arthropathy ligamentum flavum infolding. Bilateral lateral recess stenosis. Moderate-severe spinal stenosis. No foraminal stenosis.   L3-L4: Minimal broad-based disc bulge flattening the ventral thecal sac. Moderate bilateral facet arthropathy. Bilateral lateral recess stenosis. Mild spinal stenosis. No evidence of neural foraminal stenosis.   L4-L5: Broad-based disc bulge flattening the ventral thecal sac. Moderate bilateral facet arthropathy. Bilateral lateral recess stenosis. Mild spinal stenosis. No foraminal stenosis.   L5-S1: Broad-based disc osteophyte complex. Moderate bilateral facet arthropathy. Severe bilateral foraminal stenosis. Bilateral lateral recess stenosis. No central canal stenosis.   For pain he's taking glucosamine BID currently. He's no longer using voltaren gel. He finds that the glucosamine. He  stopped meloxicam previously because it wasn't helpin. He was taking 7.5mg  daily. He has allergy to asa.    Pain Inventory Average Pain 5 Pain Right Now 5 My pain is intermittent, sharp, and aching  In the last 24 hours, has pain interfered with the following? General activity 4 Relation with others 5 Enjoyment of life 5 What TIME of day is your pain at its worst? varies Sleep (in general) Fair  Pain is worse with: walking, bending, and standing Pain improves with: rest, heat/ice, and medication Relief from Meds: 5  use a cane how many minutes can you walk? 10 ability to climb steps?  no do you drive?  yes  retired I need assistance with the following:  bathing  bladder control problems bowel control problems trouble walking  New pt  New pt    Family History  Problem Relation Age of Onset   Heart failure Father    Glaucoma Mother    Osteoporosis Brother    Heart disease Brother 68       stent   Hypertension Brother    Hyperlipidemia Brother    Diabetes Brother    Colon cancer Neg Hx    Stomach cancer Neg Hx    Rectal cancer Neg Hx    Social History   Socioeconomic History   Marital status: Single    Spouse name: Not on file   Number of children: 0   Years of education: 10   Highest education level: 10th grade  Occupational History   Occupation: Retired    Comment: retired Chartered loss adjuster  Tobacco Use   Smoking status: Former    Current packs/day: 0.00    Average packs/day: 1 pack/day for 50.0 years (50.0 ttl pk-yrs)  Types: Cigarettes    Start date: 08/15/1959    Quit date: 08/14/2009    Years since quitting: 13.4   Smokeless tobacco: Never  Vaping Use   Vaping status: Never Used  Substance and Sexual Activity   Alcohol use: No   Drug use: No   Sexual activity: Not Currently  Other Topics Concern   Not on file  Social History Narrative   Retired Chartered loss adjuster   Lives at home with Brother, [name]   Social  Determinants of Health   Financial Resource Strain: Low Risk  (10/31/2022)   Overall Financial Resource Strain (CARDIA)    Difficulty of Paying Living Expenses: Not hard at all  Food Insecurity: No Food Insecurity (10/31/2022)   Hunger Vital Sign    Worried About Running Out of Food in the Last Year: Never true    Ran Out of Food in the Last Year: Never true  Transportation Needs: No Transportation Needs (10/31/2022)   PRAPARE - Administrator, Civil Service (Medical): No    Lack of Transportation (Non-Medical): No  Physical Activity: Insufficiently Active (10/31/2022)   Exercise Vital Sign    Days of Exercise per Week: 3 days    Minutes of Exercise per Session: 30 min  Stress: No Stress Concern Present (10/31/2022)   [employer] of Occupational Health - Occupational Stress Questionnaire    Feeling of Stress : Not at all  Social Connections: Moderately Isolated (10/31/2022)   Social Connection and Isolation Panel [NHANES]    Frequency of Communication with Friends and Family: More than three times a week    Frequency of Social Gatherings with Friends and Family: More than three times a week    Attends Religious Services: More than 4 times per year    Active Member of Clubs or Organizations: No    Attends Banker Meetings: Never    Marital Status: Never married   Past Surgical History:  Procedure Laterality Date   CATARACT EXTRACTION  2007   right eye   CATARACT EXTRACTION Left 03/21/15   COLONOSCOPY  02/28/2014   HERNIA REPAIR  1980   KNEE SURGERY  02-28-2008   replaced inside right knee   Left Knee Repair  11-10-2006   NOSE SURGERY     POLYPECTOMY     colon polyps   SKIN BIOPSY  10-08-2006   left ear   Past Medical History:  Diagnosis Date   BPH (benign prostatic hypertrophy)    Dr. Earlene Plater / Dr. Etta Grandchild  - Urologist    Cancer [facility])    lip-mole surgery   Cataract    bilateral   Colon polyps    Constipation    occasional - miralax prn   DDD  (degenerative disc disease)    Hearing loss    left ear, no hearing aids   Hyperlipidemia    Hypertension    Leukoplakia    Microscopic hematuria    negative work up with Urology in the past.    OAB (overactive bladder)    with past percutaneous tibial nerve stimulation therapy   Thrombocytopenia (HCC)    Tinnitus    Tobacco abuse    Vitamin B 12 deficiency    Vitamin D deficiency    BP 130/84   Pulse 66   Ht 5\' 5"  (1.651 m)   Wt 238 lb (108 kg)   SpO2 93%   BMI 39.61 kg/m   Opioid Risk Score:   Fall Risk Score:  `1  Depression screen PHQ 2/9     01/28/2023    1:17 PM 10/31/2022    8:57 AM 08/07/2022    2:24 PM 05/01/2022    2:11 PM 01/06/2022   10:25 AM 10/21/2021    1:24 PM 08/13/2021   12:51 PM  Depression screen PHQ 2/9  Decreased Interest 0 0 1 0 1 1 1   Down, Depressed, Hopeless 0 0 0 0 1 1 1   PHQ - 2 Score 0 0 1 0 2 2 2   Altered sleeping 1  1 0 0 0 0  Tired, decreased energy 1  1 0 1 1 1   Change in appetite 0  0 0 0 0 0  Feeling bad or failure about yourself  0  0 0 0 0 1  Trouble concentrating 0  0 0 0 0 1  Moving slowly or fidgety/restless 0  2 0 1 0 1  Suicidal thoughts 0  0 0 0 0 0  PHQ-9 Score 2  5 0 4 3 6   Difficult doing work/chores Somewhat difficult  Somewhat difficult Not difficult at all Somewhat difficult Somewhat difficult Somewhat difficult     Review of Systems  Respiratory:  Positive for cough and shortness of breath.   Gastrointestinal:  Positive for constipation and diarrhea.  Musculoskeletal:  Positive for gait problem.       Right knee pain  All other systems reviewed and are negative.     Objective:   Physical Exam Gen: no distress, normal appearing, obese HEENT: oral mucosa pink and moist, NCAT Cardio: Reg rate Chest: normal effort, normal rate of breathing Abd: soft, non-distended Ext: no edema Psych: pleasant, normal affect Skin: intact Neuro: Alert and oriented x 3. Normal insight and awareness. Intact Memory. Normal  language and speech. Cranial nerve exam unremarkable. MMT: 5/5. Decreased LT/PP in both feet. .   Musculoskeletal: pain left knee along medial aspect/surgical site. Jt line pain medially and laterally. Varus deformity at left knee. Significant antalgia with wb on left. Uses cane for ambulation.  SLR equivocal. Bending at waist caused radiation of pain along hips and anterior thighs to knees. Extension of lumbar spine caused some discomfort. Patient with pain to palpation of lumbar spine.       Assessment & Plan:  Left greater than right knee pain d/t osteoarthritis. Hx of left medial-parital knee replacement 15+ years ago. Recent xray demonstrates OA at knee. I suspect majority of pain is originating at prosthesis however. Lumbar spondylosis with intermittent radiculitis Idiopathic peripheral neuropathy    Plan: Diclofenac 50mg  bid Xray right knee 3.  Left knee steroid injection at next visit. Consider zilretta  4.  May need ortho opinion for TKA at some point.    At least 45 total minutes were spent in examination of patient, assessment of pertinent data,  formulation of a treatment plan, and in discussion with patient, family, and/or treating providers.

## 2023-01-28 NOTE — Patient Instructions (Signed)
ALWAYS FEEL FREE TO CALL OUR OFFICE WITH ANY PROBLEMS OR QUESTIONS ([phone])  **PLEASE NOTE** ALL MEDICATION REFILL REQUESTS (INCLUDING CONTROLLED SUBSTANCES) NEED TO BE MADE AT LEAST 7 DAYS PRIOR TO REFILL BEING DUE. ANY REFILL REQUESTS INSIDE THAT TIME FRAME MAY RESULT IN DELAYS IN RECEIVING YOUR PRESCRIPTION.                    

## 2023-02-05 ENCOUNTER — Ambulatory Visit (INDEPENDENT_AMBULATORY_CARE_PROVIDER_SITE_OTHER): Payer: PPO | Admitting: Family

## 2023-02-05 ENCOUNTER — Encounter: Payer: Self-pay | Admitting: Family

## 2023-02-05 VITALS — BP 138/76 | HR 60 | Temp 97.7°F | Ht 65.0 in | Wt 240.8 lb

## 2023-02-05 DIAGNOSIS — R600 Localized edema: Secondary | ICD-10-CM

## 2023-02-05 DIAGNOSIS — I700 Atherosclerosis of aorta: Secondary | ICD-10-CM | POA: Diagnosis not present

## 2023-02-05 DIAGNOSIS — I10 Essential (primary) hypertension: Secondary | ICD-10-CM | POA: Diagnosis not present

## 2023-02-05 DIAGNOSIS — E1169 Type 2 diabetes mellitus with other specified complication: Secondary | ICD-10-CM | POA: Diagnosis not present

## 2023-02-05 DIAGNOSIS — N401 Enlarged prostate with lower urinary tract symptoms: Secondary | ICD-10-CM

## 2023-02-05 DIAGNOSIS — M170 Bilateral primary osteoarthritis of knee: Secondary | ICD-10-CM | POA: Diagnosis not present

## 2023-02-05 DIAGNOSIS — E782 Mixed hyperlipidemia: Secondary | ICD-10-CM

## 2023-02-05 LAB — BAYER DCA HB A1C WAIVED: HB A1C (BAYER DCA - WAIVED): 6.2 % — ABNORMAL HIGH (ref 4.8–5.6)

## 2023-02-05 NOTE — Patient Instructions (Signed)
Peripheral Edema  Peripheral edema is swelling that is caused by a buildup of fluid. Peripheral edema most often affects the lower legs, ankles, and feet. It can also develop in the arms, hands, and face. The area of the body that has peripheral edema will look swollen. It may also feel heavy or warm. Your clothes may start to feel tight. Pressing on the area may make a temporary dent in your skin (pitting edema). You may not be able to move your swollen arm or leg as much as usual. There are many causes of peripheral edema. It can happen because of a complication of other conditions such as heart failure, kidney disease, or a problem with your circulation. It also can be a side effect of certain medicines or happen because of an infection. It often happens to women during pregnancy. Sometimes, the cause is not known. Follow these instructions at home: Managing pain, stiffness, and swelling  Raise (elevate) your legs while you are sitting or lying down. Move around often to prevent stiffness and to reduce swelling. Do not sit or stand for long periods of time. Do not wear tight clothing. Do not wear garters on your upper legs. Exercise your legs to get your circulation going. This helps to move the fluid back into your blood vessels, and it may help the swelling go down. Wear compression stockings as told by your health care provider. These stockings help to prevent blood clots and reduce swelling in your legs. It is important that these are the correct size. These stockings should be prescribed by your doctor to prevent possible injuries. If elastic bandages or wraps are recommended, use them as told by your health care provider. Medicines Take over-the-counter and prescription medicines only as told by your health care provider. Your health care provider may prescribe medicine to help your body get rid of excess water (diuretic). Take this medicine if you are told to take it. General  instructions Eat a low-salt (low-sodium) diet as told by your health care provider. Sometimes, eating less salt may reduce swelling. Pay attention to any changes in your symptoms. Moisturize your skin daily to help prevent skin from cracking and draining. Keep all follow-up visits. This is important. Contact a health care provider if: You have a fever. You have swelling in only one leg. You have increased swelling, redness, or pain in one or both of your legs. You have drainage or sores at the area where you have edema. Get help right away if: You have edema that starts suddenly or is getting worse, especially if you are pregnant or have a medical condition. You develop shortness of breath, especially when you are lying down. You have pain in your chest or abdomen. You feel weak. You feel like you will faint. These symptoms may be an emergency. Get help right away. Call 911. Do not wait to see if the symptoms will go away. Do not drive yourself to the hospital. Summary Peripheral edema is swelling that is caused by a buildup of fluid. Peripheral edema most often affects the lower legs, ankles, and feet. Move around often to prevent stiffness and to reduce swelling. Do not sit or stand for long periods of time. Pay attention to any changes in your symptoms. Contact a health care provider if you have edema that starts suddenly or is getting worse, especially if you are pregnant or have a medical condition. Get help right away if you develop shortness of breath, especially when lying down.   This information is not intended to replace advice given to you by your health care provider. Make sure you discuss any questions you have with your health care provider. Document Revised: 02/04/2021 Document Reviewed: 02/04/2021 Elsevier Patient Education  2024 Elsevier Inc.  

## 2023-02-05 NOTE — Progress Notes (Signed)
Subjective:    Patient ID: Lonnie Hurley, male    DOB: 09/29/1940, 82 y.o.   MRN: [id number]  Chief Complaint  Patient presents with   Medical Management of Chronic Issues   Pt presents to the office today for  chronic follow up.   He is followed by Urologists every 6 months for BPH. Followed by Cardiologists annually.  He is followed by Dermatologists every 6 months. He is followed by Ortho for osteoarthritis for bilateral knee pain and getting steroid injections.    He has aortic atherosclerosis and takes Zetia and Zocor. Hypertension This is a chronic problem. The current episode started more than 1 year ago. The problem has been resolved since onset. The problem is controlled. Associated symptoms include malaise/fatigue and peripheral edema. Pertinent negatives include no blurred vision or shortness of breath. Risk factors for coronary artery disease include dyslipidemia, diabetes mellitus, obesity and male gender. The current treatment provides moderate improvement.  Diabetes He presents for his follow-up diabetic visit. He has type 2 diabetes mellitus. Associated symptoms include foot paresthesias. Pertinent negatives for diabetes include no blurred vision. Symptoms are stable. Diabetic complications include peripheral neuropathy. Risk factors for coronary artery disease include dyslipidemia, diabetes mellitus, hypertension, male sex and sedentary lifestyle. He is following a generally unhealthy diet. His overall blood glucose range is 110-130 mg/dl.  Arthritis Presents for follow-up visit. He complains of pain and stiffness. Affected locations include the left knee, right knee, left MCP and right MCP. His pain is at a severity of 5/10.  Benign Prostatic Hypertrophy This is a chronic problem. The current episode started more than 1 year ago. Irritative symptoms include nocturia. Past treatments include tamsulosin. The treatment provided moderate relief.      Review of Systems   Constitutional:  Positive for malaise/fatigue.  Eyes:  Negative for blurred vision.  Respiratory:  Negative for shortness of breath.   Genitourinary:  Positive for nocturia.  Musculoskeletal:  Positive for arthritis and stiffness.  All other systems reviewed and are negative.      Objective:   Physical Exam Vitals reviewed.  Constitutional:      General: He is not in acute distress.    Appearance: He is well-developed. He is obese.  HENT:     Head: Normocephalic.     Right Ear: Tympanic membrane normal.     Left Ear: Tympanic membrane normal.  Eyes:     General:        Right eye: No discharge.        Left eye: No discharge.     Pupils: Pupils are equal, round, and reactive to light.  Neck:     Thyroid: No thyromegaly.  Cardiovascular:     Rate and Rhythm: Normal rate and regular rhythm.     Heart sounds: Normal heart sounds. No murmur heard. Pulmonary:     Effort: Pulmonary effort is normal. No respiratory distress.     Breath sounds: Normal breath sounds. No wheezing.  Abdominal:     General: Bowel sounds are normal. There is no distension.     Palpations: Abdomen is soft.     Tenderness: There is no abdominal tenderness.  Musculoskeletal:        General: No tenderness. Normal range of motion.     Cervical back: Normal range of motion and neck supple.     Right lower leg: Edema (2+) present.     Left lower leg: Edema (2+) present.  Skin:    General: Skin is  warm and dry.     Findings: No erythema or rash.  Neurological:     Mental Status: He is alert and oriented to person, place, and time.     Cranial Nerves: No cranial nerve deficit.     Deep Tendon Reflexes: Reflexes are normal and symmetric.  Psychiatric:        Behavior: Behavior normal.        Thought Content: Thought content normal.        Judgment: Judgment normal.     Diabetic Foot Exam - Simple   Simple Foot Form Diabetic Foot exam was performed with the following findings: Yes 02/05/2023  2:54 PM   Visual Inspection No deformities, no ulcerations, no other skin breakdown bilaterally: Yes Sensation Testing Intact to touch and monofilament testing bilaterally: Yes Pulse Check Posterior Tibialis and Dorsalis pulse intact bilaterally: Yes Comments      BP 138/76   Pulse 60   Temp 97.7 F (36.5 C) (Temporal)   Ht 5\' 5"  (1.651 m)   Wt 240 lb 12.8 oz (109.2 kg)   SpO2 94%   BMI 40.07 kg/m      Assessment & Plan:   Neji Seitz comes in today with chief complaint of Medical Management of Chronic Issues   Diagnosis and orders addressed:  1. Aortic atherosclerosis (HCC) - CMP14+EGFR - CBC with Differential/Platelet  2. Benign prostatic hyperplasia with lower urinary tract symptoms, symptom details unspecified - CMP14+EGFR - CBC with Differential/Platelet  3. Type 2 diabetes mellitus with other specified complication, without long-term current use of insulin (HCC) - Bayer DCA Hb A1c Waived - CMP14+EGFR - CBC with Differential/Platelet - Microalbumin / creatinine urine ratio  4. Mixed hyperlipidemia - CMP14+EGFR - CBC with Differential/Platelet  5. Primary hypertension - CMP14+EGFR - CBC with Differential/Platelet  6. Peripheral edema Low salt diet  Elevated foot  - CMP14+EGFR  7. Primary osteoarthritis of both knees Keep follow up with Ortho   Labs pending Health Maintenance reviewed Diet and exercise encouraged  Follow up plan: 4 months    Jannifer Rodney, FNP

## 2023-02-06 ENCOUNTER — Other Ambulatory Visit: Payer: Self-pay | Admitting: Family

## 2023-02-06 LAB — CBC WITH DIFFERENTIAL/PLATELET
Basophils Absolute: 0.1 10*3/uL (ref 0.0–0.2)
Basos: 1 %
EOS (ABSOLUTE): 0.2 10*3/uL (ref 0.0–0.4)
Eos: 2 %
Hematocrit: 45.4 % (ref 37.5–51.0)
Hemoglobin: 14.7 g/dL (ref 13.0–17.7)
Immature Grans (Abs): 0.1 10*3/uL (ref 0.0–0.1)
Immature Granulocytes: 1 %
Lymphocytes Absolute: 3.1 10*3/uL (ref 0.7–3.1)
Lymphs: 32 %
MCH: 29.3 pg (ref 26.6–33.0)
MCHC: 32.4 g/dL (ref 31.5–35.7)
MCV: 90 fL (ref 79–97)
Monocytes Absolute: 1.1 10*3/uL — ABNORMAL HIGH (ref 0.1–0.9)
Monocytes: 11 %
Neutrophils Absolute: 5.1 10*3/uL (ref 1.4–7.0)
Neutrophils: 53 %
Platelets: 132 10*3/uL — ABNORMAL LOW (ref 150–450)
RBC: 5.02 x10E6/uL (ref 4.14–5.80)
RDW: 13.1 % (ref 11.6–15.4)
WBC: 9.6 10*3/uL (ref 3.4–10.8)

## 2023-02-06 LAB — CMP14+EGFR
ALT: 19 IU/L (ref 0–44)
AST: 20 IU/L (ref 0–40)
Albumin: 3.9 g/dL (ref 3.8–4.8)
Alkaline Phosphatase: 64 IU/L (ref 44–121)
BUN/Creatinine Ratio: 14 (ref 10–24)
BUN: 13 mg/dL (ref 8–27)
Bilirubin Total: 0.6 mg/dL (ref 0.0–1.2)
CO2: 27 mmol/L (ref 20–29)
Calcium: 9 mg/dL (ref 8.6–10.2)
Chloride: 105 mmol/L (ref 96–106)
Creatinine, Ser: 0.91 mg/dL (ref 0.76–1.27)
Globulin, Total: 2.2 g/dL (ref 1.5–4.5)
Glucose: 91 mg/dL (ref 70–99)
Potassium: 4 mmol/L (ref 3.5–5.2)
Sodium: 144 mmol/L (ref 134–144)
Total Protein: 6.1 g/dL (ref 6.0–8.5)
eGFR: 86 mL/min/{1.73_m2} (ref >59)

## 2023-02-06 LAB — MICROALBUMIN / CREATININE URINE RATIO
Creatinine, Urine: 186.5 mg/dL
Microalb/Creat Ratio: 44 mg/g{creat} — ABNORMAL HIGH (ref 0–29)
Microalbumin, Urine: 81.4 ug/mL

## 2023-02-09 ENCOUNTER — Encounter: Payer: PPO | Admitting: Physical Medicine and Rehabilitation

## 2023-02-11 ENCOUNTER — Encounter: Payer: Self-pay | Admitting: Physical Medicine & Rehabilitation

## 2023-02-11 ENCOUNTER — Encounter: Payer: PPO | Admitting: Physical Medicine & Rehabilitation

## 2023-02-11 ENCOUNTER — Encounter: Payer: Self-pay | Admitting: Family Medicine

## 2023-02-11 VITALS — BP 145/66 | HR 67 | Ht 65.0 in | Wt 237.0 lb

## 2023-02-11 DIAGNOSIS — M25561 Pain in right knee: Secondary | ICD-10-CM

## 2023-02-11 DIAGNOSIS — G8929 Other chronic pain: Secondary | ICD-10-CM

## 2023-02-11 DIAGNOSIS — M25562 Pain in left knee: Secondary | ICD-10-CM

## 2023-02-11 MED ORDER — MELOXICAM 7.5 MG PO TABS: 7.5000 mg | ORAL_TABLET | Freq: Every day | ORAL | 2 refills | Status: DC

## 2023-02-11 MED ORDER — LIDOCAINE HCL 1 % IJ SOLN
5.0000 mL | Freq: Once | INTRAMUSCULAR | Status: AC
Start: 2023-02-11 — End: 2023-02-11
  Administered 2023-02-11: 5 mL

## 2023-02-11 MED ORDER — TRIAMCINOLONE ACETONIDE 40 MG/ML IJ SUSP
40.0000 mg | Freq: Once | INTRAMUSCULAR | Status: AC
Start: 2023-02-11 — End: 2023-02-11
  Administered 2023-02-11: 40 mg via INTRAMUSCULAR

## 2023-02-11 NOTE — Progress Notes (Signed)
PROCEDURE NOTE  DIAGNOSIS: Chronic pain of both knees - Plan: lidocaine (XYLOCAINE) 1 % (with pres) injection 5 mL, triamcinolone acetonide (KENALOG-40) injection 40 mg  INTERVENTION:  major joint injection left knee     After informed consent and preparation of the skin with betadine and isopropyl alcohol, I injected 6mg  (1cc) of celestone and 4cc of 1% lidocaine into the left knee via anterolateral approach. Additionally, aspiration was performed prior to injection. The patient tolerated well, and no complications were encountered. Afterward the area was cleaned and dressed. Post- injection instructions were provided.    Didn't tolerate diclofenac due to urine retention and swelling.   Ranelle Oyster, MD, [facility] 02/11/2023

## 2023-02-11 NOTE — Patient Instructions (Signed)
ALWAYS FEEL FREE TO CALL OUR OFFICE WITH ANY PROBLEMS OR QUESTIONS ([phone])  **PLEASE NOTE** ALL MEDICATION REFILL REQUESTS (INCLUDING CONTROLLED SUBSTANCES) NEED TO BE MADE AT LEAST 7 DAYS PRIOR TO REFILL BEING DUE. ANY REFILL REQUESTS INSIDE THAT TIME FRAME MAY RESULT IN DELAYS IN RECEIVING YOUR PRESCRIPTION.                    

## 2023-02-18 ENCOUNTER — Ambulatory Visit: Payer: PPO

## 2023-02-27 ENCOUNTER — Ambulatory Visit (INDEPENDENT_AMBULATORY_CARE_PROVIDER_SITE_OTHER): Payer: PPO

## 2023-02-27 DIAGNOSIS — E538 Deficiency of other specified B group vitamins: Secondary | ICD-10-CM | POA: Diagnosis not present

## 2023-02-27 DIAGNOSIS — Z23 Encounter for immunization: Secondary | ICD-10-CM

## 2023-02-27 NOTE — Progress Notes (Signed)
Cyanocobalamin injection given to left deltoid.  Patient tolerated well.

## 2023-03-16 ENCOUNTER — Ambulatory Visit (INDEPENDENT_AMBULATORY_CARE_PROVIDER_SITE_OTHER): Payer: PPO | Admitting: Family Medicine

## 2023-03-16 DIAGNOSIS — Z23 Encounter for immunization: Secondary | ICD-10-CM

## 2023-04-01 ENCOUNTER — Telehealth: Payer: Self-pay | Admitting: Family

## 2023-04-01 DIAGNOSIS — E785 Hyperlipidemia, unspecified: Secondary | ICD-10-CM

## 2023-04-01 MED ORDER — SIMVASTATIN 40 MG PO TABS
40.0000 mg | ORAL_TABLET | Freq: Every day | ORAL | 0 refills | Status: DC
Start: 2023-04-01 — End: 2023-04-07

## 2023-04-01 NOTE — Telephone Encounter (Signed)
Pt has only ever been on Simvastatin, refill sent to pharmacy

## 2023-04-01 NOTE — Telephone Encounter (Signed)
Needing clarification on if pt is taking simvastatin (ZOCOR) 40 MG tablet  or rouvastin 40 MG. Pt called pharmacy to request this prescription but simvastatin (ZOCOR) 40 MG tablet  is on his current medication list.

## 2023-04-02 ENCOUNTER — Telehealth: Payer: Self-pay | Admitting: Family

## 2023-04-02 ENCOUNTER — Ambulatory Visit: Payer: PPO

## 2023-04-02 DIAGNOSIS — E785 Hyperlipidemia, unspecified: Secondary | ICD-10-CM

## 2023-04-02 DIAGNOSIS — M79604 Pain in right leg: Secondary | ICD-10-CM

## 2023-04-02 DIAGNOSIS — E538 Deficiency of other specified B group vitamins: Secondary | ICD-10-CM

## 2023-04-02 MED ORDER — GABAPENTIN 100 MG PO CAPS: 100.0000 mg | ORAL_CAPSULE | Freq: Every day | ORAL | 0 refills | Status: DC

## 2023-04-02 MED ORDER — EZETIMIBE 10 MG PO TABS
10.0000 mg | ORAL_TABLET | Freq: Every day | ORAL | 0 refills | Status: DC
Start: 2023-04-02 — End: 2023-04-07

## 2023-04-02 MED ORDER — TAMSULOSIN HCL 0.4 MG PO CAPS: 0.4000 mg | ORAL_CAPSULE | Freq: Every day | ORAL | 0 refills | Status: DC

## 2023-04-02 NOTE — Telephone Encounter (Signed)
  Prescription Request  04/02/2023  Is this a "Controlled Substance" medicine? no  Have you seen your PCP in the last 2 weeks? no  If YES, route message to pool  -  If NO, patient needs to be scheduled for appointment.  What is the name of the medication or equipment? Ezetimide 10 mg,Gabapentin 100 mg, Tamsulosin 0.4 mg Simvastatin 40 mg  Have you contacted your pharmacy to request a refill? no   Which pharmacy would you like this sent to? [facility]   Patient notified that their request is being sent to the clinical staff for review and that they should receive a response within 2 business days.

## 2023-04-02 NOTE — Telephone Encounter (Signed)
Pt has never been on Rosuvastatin, only on Simvastatin, call was made yesterday to [facility] and refill on Simvastatin was sent.

## 2023-04-07 ENCOUNTER — Ambulatory Visit (INDEPENDENT_AMBULATORY_CARE_PROVIDER_SITE_OTHER): Payer: PPO | Admitting: Family

## 2023-04-07 ENCOUNTER — Encounter: Payer: Self-pay | Admitting: Family

## 2023-04-07 VITALS — BP 123/69 | HR 67 | Temp 98.6°F | Ht 65.0 in | Wt 231.8 lb

## 2023-04-07 DIAGNOSIS — E785 Hyperlipidemia, unspecified: Secondary | ICD-10-CM

## 2023-04-07 DIAGNOSIS — R350 Frequency of micturition: Secondary | ICD-10-CM

## 2023-04-07 DIAGNOSIS — N401 Enlarged prostate with lower urinary tract symptoms: Secondary | ICD-10-CM | POA: Diagnosis not present

## 2023-04-07 DIAGNOSIS — I350 Nonrheumatic aortic (valve) stenosis: Secondary | ICD-10-CM | POA: Insufficient documentation

## 2023-04-07 DIAGNOSIS — I10 Essential (primary) hypertension: Secondary | ICD-10-CM | POA: Diagnosis not present

## 2023-04-07 DIAGNOSIS — N390 Urinary tract infection, site not specified: Secondary | ICD-10-CM | POA: Diagnosis not present

## 2023-04-07 LAB — URINALYSIS, COMPLETE
Bilirubin, UA: NEGATIVE
Ketones, UA: NEGATIVE
Leukocytes,UA: NEGATIVE
Nitrite, UA: NEGATIVE
Specific Gravity, UA: >1.03 — ABNORMAL HIGH (ref 1.005–1.030)
Urobilinogen, Ur: 0.2 mg/dL (ref 0.2–1.0)
pH, UA: 5 (ref 5.0–7.5)

## 2023-04-07 LAB — MICROSCOPIC EXAMINATION
Renal Epithel, UA: NONE SEEN /[HPF]
Yeast, UA: NONE SEEN

## 2023-04-07 MED ORDER — EZETIMIBE 10 MG PO TABS: 10.0000 mg | ORAL_TABLET | Freq: Every day | ORAL | 0 refills | Status: DC

## 2023-04-07 MED ORDER — TAMSULOSIN HCL 0.4 MG PO CAPS: 0.8000 mg | ORAL_CAPSULE | Freq: Every day | ORAL | 1 refills | Status: DC

## 2023-04-07 MED ORDER — TAMSULOSIN HCL 0.4 MG PO CAPS: 0.4000 mg | ORAL_CAPSULE | Freq: Every day | ORAL | 0 refills | Status: DC

## 2023-04-07 MED ORDER — SIMVASTATIN 40 MG PO TABS: 40.0000 mg | ORAL_TABLET | Freq: Every day | ORAL | 0 refills | Status: DC

## 2023-04-07 MED ORDER — RAMIPRIL 10 MG PO CAPS: 10.0000 mg | ORAL_CAPSULE | Freq: Every day | ORAL | 4 refills | Status: DC

## 2023-04-07 NOTE — Progress Notes (Signed)
Subjective:    Patient ID: Lonnie Hurley, male    DOB: December 21, 1940, 82 y.o.   MRN: [id number]  Chief Complaint  Patient presents with   Urinary Frequency   Pt presents to the office for urinary frequency. He is followed by Urologists every 6 months.  Urinary Frequency  This is a chronic problem. The current episode started more than 1 year ago. The problem occurs intermittently. The problem has been waxing and waning. The patient is experiencing no pain. Associated symptoms include frequency, hesitancy and urgency. Pertinent negatives include no discharge, flank pain, hematuria, nausea or vomiting. He has tried increased fluids for the symptoms. The treatment provided mild relief.  Benign Prostatic Hypertrophy This is a chronic problem. The current episode started more than 1 year ago. Irritative symptoms include frequency, nocturia (3) and urgency. Obstructive symptoms include an intermittent stream and a slower stream. Obstructive symptoms do not include straining. Associated symptoms include hesitancy. Pertinent negatives include no hematuria, nausea or vomiting. Past treatments include tamsulosin. The treatment provided mild relief.  Hypertension This is a chronic problem. The current episode started more than 1 year ago. The problem has been resolved since onset. Pertinent negatives include no malaise/fatigue, peripheral edema or shortness of breath. Risk factors for coronary artery disease include dyslipidemia and male gender. The current treatment provides moderate improvement.  Hyperlipidemia This is a chronic problem. The current episode started more than 1 year ago. Exacerbating diseases include obesity. Pertinent negatives include no shortness of breath. Current antihyperlipidemic treatment includes ezetimibe. The current treatment provides moderate improvement of lipids.      Review of Systems  Constitutional:  Negative for malaise/fatigue.  Respiratory:  Negative for shortness  of breath.   Gastrointestinal:  Negative for nausea and vomiting.  Genitourinary:  Positive for frequency, hesitancy, nocturia (3) and urgency. Negative for flank pain and hematuria.  All other systems reviewed and are negative.      Objective:   Physical Exam Vitals reviewed.  Constitutional:      General: He is not in acute distress.    Appearance: He is well-developed. He is obese.  HENT:     Head: Normocephalic.     Right Ear: Tympanic membrane normal.     Left Ear: Tympanic membrane normal.  Eyes:     General:        Right eye: No discharge.        Left eye: No discharge.     Pupils: Pupils are equal, round, and reactive to light.  Neck:     Thyroid: No thyromegaly.  Cardiovascular:     Rate and Rhythm: Normal rate and regular rhythm.     Heart sounds: Normal heart sounds. No murmur heard. Pulmonary:     Effort: Pulmonary effort is normal. No respiratory distress.     Breath sounds: Normal breath sounds. No wheezing.  Abdominal:     General: Bowel sounds are normal. There is no distension.     Palpations: Abdomen is soft.     Tenderness: There is no abdominal tenderness.  Musculoskeletal:        General: No tenderness. Normal range of motion.     Cervical back: Normal range of motion and neck supple.  Skin:    General: Skin is warm and dry.     Findings: No erythema or rash.  Neurological:     Mental Status: He is alert and oriented to person, place, and time.     Cranial Nerves: No cranial nerve deficit.  Motor: Weakness (using cane) present.     Gait: Gait abnormal.     Deep Tendon Reflexes: Reflexes are normal and symmetric.  Psychiatric:        Behavior: Behavior normal.        Thought Content: Thought content normal.        Judgment: Judgment normal.      BP 123/69   Pulse 67   Temp 98.6 F (37 C) (Temporal)   Ht 5\' 5"  (1.651 m)   Wt 231 lb 12.8 oz (105.1 kg)   SpO2 95%   BMI 38.57 kg/m       Assessment & Plan:  Kyston Tomita comes in  today with chief complaint of Urinary Frequency   Diagnosis and orders addressed:  1. Urinary frequency - Urine Culture - Urinalysis, Complete  2. Hyperlipidemia, unspecified hyperlipidemia type - ezetimibe (ZETIA) 10 MG tablet; Take 1 tablet (10 mg total) by mouth daily.  Dispense: 90 tablet; Refill: 0 - simvastatin (ZOCOR) 40 MG tablet; Take 1 tablet (40 mg total) by mouth at bedtime.  Dispense: 90 tablet; Refill: 0  3. Essential hypertension - ramipril (ALTACE) 10 MG capsule; Take 1 capsule (10 mg total) by mouth daily.  Dispense: 90 capsule; Refill: 4  4. Benign prostatic hyperplasia with urinary frequency - tamsulosin (FLOMAX) 0.4 MG CAPS capsule; Take 2 capsules (0.8 mg total) by mouth at bedtime.  Dispense: 180 capsule; Refill: 1   Will increase Flomax to 0.8 mg Keep Urologists follow up Urine culture pending  Follow up if symptoms worsen or do not improve    Jannifer Rodney, FNP

## 2023-04-07 NOTE — Progress Notes (Unsigned)
Cardiology Office Note:   Date:  04/08/2023  ID:  Revanth Trabucco, DOB 04-12-1941, MRN [id number] PCP: Junie Spencer, FNP  [facility] Providers Cardiologist:  None {  History of Present Illness:   Lonnie Hurley is a 82 y.o. male who is referred by Junie Spencer, FNP for evaluation of shortness of breath.  CT from 2019 and he was noted to have some coronary calcium and aortic atherosclerosis.      Perfusion study did not demonstrate any high risk findings.  Echo demonstrated mild AS.  Since I last saw him he has done relatively well.  He is mostly bothered by neuropathy.  He does some work in [employer] household chores.  He gets around with a cane because of joint problems and his neuropathy.  He denies any chest pressure, neck or arm discomfort.  He denies any palpitations, presyncope or syncope.  He has some baseline dyspnea.  ROS: Positive for knee pain and neuropathy.  Otherwise as stated in the HPI and negative for all other systems.  Studies Reviewed:    EKG:   EKG Interpretation Date/Time:  Wednesday April 08 2023 13:12:35 EDT Ventricular Rate:  81 PR Interval:  172 QRS Duration:  138 QT Interval:  388 QTC Calculation: 450 R Axis:   71  Text Interpretation: Normal sinus rhythm Right bundle branch block Confirmed by Rollene Rotunda ([id number]) on 04/08/2023 1:24:07 PM    Risk Assessment/Calculations:      Physical Exam:   VS:  BP 130/80   Pulse 81   Ht 5\' 6"  (1.676 m)   Wt 230 lb (104.3 kg)   BMI 37.12 kg/m    Wt Readings from Last 3 Encounters:  04/08/23 230 lb (104.3 kg)  04/07/23 231 lb 12.8 oz (105.1 kg)  02/11/23 237 lb (107.5 kg)     GEN: Well nourished, well developed in no acute distress NECK: No JVD; No carotid bruits CARDIAC: RRR, no murmurs, rubs, gallops RESPIRATORY:  Clear to auscultation without rales, wheezing or rhonchi  ABDOMEN: Soft, non-tender, non-distended EXTREMITIES:  No edema; No deformity   ASSESSMENT AND PLAN:   DOE:    This seems to be baseline.  It is not changed since he had his stress test and echo.  No change in therapy.   AS:   This was mild.  I will follow this clinically.    HTN: The blood pressure is at target.  He will continue on the meds as listed.    DYSLIPIDEMIA:   LDL was 53.  No change in therapy.    LEG PAIN:   He had no evidence of vascular disease on ABIs.  I think he probably has some neuropathy and I have suggested benfotiamine.   DM: A1c was 6.2 which is better than previous.    RBBB: This has been chronic.  No change in therapy.    Follow up with me in about 18 months.   Signed, Rollene Rotunda, MD

## 2023-04-07 NOTE — Patient Instructions (Signed)

## 2023-04-08 ENCOUNTER — Ambulatory Visit: Payer: PPO | Admitting: Cardiology

## 2023-04-08 ENCOUNTER — Encounter: Payer: Self-pay | Admitting: Cardiology

## 2023-04-08 VITALS — BP 130/80 | HR 81 | Ht 66.0 in | Wt 230.0 lb

## 2023-04-08 DIAGNOSIS — E118 Type 2 diabetes mellitus with unspecified complications: Secondary | ICD-10-CM | POA: Diagnosis not present

## 2023-04-08 DIAGNOSIS — I10 Essential (primary) hypertension: Secondary | ICD-10-CM | POA: Diagnosis not present

## 2023-04-08 DIAGNOSIS — I350 Nonrheumatic aortic (valve) stenosis: Secondary | ICD-10-CM | POA: Diagnosis not present

## 2023-04-08 DIAGNOSIS — R0602 Shortness of breath: Secondary | ICD-10-CM

## 2023-04-08 DIAGNOSIS — E785 Hyperlipidemia, unspecified: Secondary | ICD-10-CM | POA: Diagnosis not present

## 2023-04-08 NOTE — Patient Instructions (Addendum)
Medication Instructions:  The current medical regimen is effective;  continue present plan and medications.  *If you need a refill on your cardiac medications before your next appointment, please call your pharmacy*  Follow-Up: At [facility], you and your health needs are our priority.  As part of our continuing mission to provide you with exceptional heart care, we have created designated Provider Care Teams.  These Care Teams include your primary Cardiologist (physician) and Advanced Practice Providers (APPs -  Physician Assistants and Nurse Practitioners) who all work together to provide you with the care you need, when you need it.  We recommend signing up for the patient portal called "MyChart".  Sign up information is provided on this After Visit Summary.  MyChart is used to connect with patients for Virtual Visits (Telemedicine).  Patients are able to view lab/test results, encounter notes, upcoming appointments, etc.  Non-urgent messages can be sent to your provider as well.   To learn more about what you can do with MyChart, go to [email or web address].    Your next appointment:   18 month(s)  Provider:   Rollene Rotunda, MD    Benfotiamine (B1)

## 2023-04-09 ENCOUNTER — Telehealth: Payer: Self-pay | Admitting: Cardiology

## 2023-04-09 LAB — URINE CULTURE

## 2023-04-09 NOTE — Telephone Encounter (Signed)
Patient would like to know what dosage daily?

## 2023-04-09 NOTE — Telephone Encounter (Signed)
Pt c/o medication issue:  1. Name of Medication: Benfotiamine (B1)   2. How are you currently taking this medication (dosage and times per day)? As Written  3. Are you having a reaction (difficulty breathing--STAT)? No   4. What is your medication issue? Patient would like to know the dosage amount that he should take. Patient mentioned he is having a hard time finding this medication in stock. Please advise.

## 2023-04-09 NOTE — Telephone Encounter (Signed)
Attempted to contact pt.  Phone rang multiple times then received message to enter remote access code.  Will attempt again in AM. Recommended dosing is between 150 mg to 600 mg daily.

## 2023-04-10 NOTE — Telephone Encounter (Signed)
Attempted to call patient with no answer. Unable to leave message due to access code needed.

## 2023-04-10 NOTE — Telephone Encounter (Signed)
Spoke to patient and message relayed.

## 2023-04-14 DIAGNOSIS — Z85828 Personal history of other malignant neoplasm of skin: Secondary | ICD-10-CM | POA: Diagnosis not present

## 2023-04-14 DIAGNOSIS — D485 Neoplasm of uncertain behavior of skin: Secondary | ICD-10-CM | POA: Diagnosis not present

## 2023-04-14 DIAGNOSIS — L218 Other seborrheic dermatitis: Secondary | ICD-10-CM | POA: Diagnosis not present

## 2023-04-14 DIAGNOSIS — L821 Other seborrheic keratosis: Secondary | ICD-10-CM | POA: Diagnosis not present

## 2023-04-14 DIAGNOSIS — D0461 Carcinoma in situ of skin of right upper limb, including shoulder: Secondary | ICD-10-CM | POA: Diagnosis not present

## 2023-04-14 DIAGNOSIS — D0421 Carcinoma in situ of skin of right ear and external auricular canal: Secondary | ICD-10-CM | POA: Diagnosis not present

## 2023-04-14 DIAGNOSIS — L570 Actinic keratosis: Secondary | ICD-10-CM | POA: Diagnosis not present

## 2023-04-15 ENCOUNTER — Encounter: Payer: PPO | Attending: Physical Medicine and Rehabilitation | Admitting: Physical Medicine & Rehabilitation

## 2023-04-15 ENCOUNTER — Encounter: Payer: Self-pay | Admitting: Physical Medicine & Rehabilitation

## 2023-04-15 VITALS — BP 127/74 | HR 73 | Ht 66.0 in | Wt 232.0 lb

## 2023-04-15 DIAGNOSIS — M170 Bilateral primary osteoarthritis of knee: Secondary | ICD-10-CM | POA: Diagnosis not present

## 2023-04-15 DIAGNOSIS — G609 Hereditary and idiopathic neuropathy, unspecified: Secondary | ICD-10-CM | POA: Insufficient documentation

## 2023-04-15 NOTE — Progress Notes (Signed)
Subjective:    Patient ID: Lonnie Hurley, male    DOB: [birth year], 82 y.o.   MRN: [id number]  HPI  Lonnie Hurley is here in follow up of his bilateral knee pain. He had very good results with the left knee injection. He still has significant pain relief now at 2 months. He now is having more pain in the right knee.  He didn't tolerate voltaren due to urinary retention. That has since improved with flomax per his primary. He says he's on meloxicam currently. I didn't prescribe it.  I reviewed his right knee x-rays which show tricompartmental arthritis worse along the medial aspect of the joint.  He still is using a straight cane for balance.  He is getting up frequently at night to use the bathroom.  Sometimes he feels unsteady doing so.    Pain Inventory Average Pain 5 Pain Right Now 2 My pain is aching  In the last 24 hours, has pain interfered with the following? General activity 0 Relation with others 0 Enjoyment of life 0 What TIME of day is your pain at its worst? varies Sleep (in general) Good  Pain is worse with: walking, standing, and some activites Pain improves with: rest, medication, and injections Relief from Meds: 4  Family History  Problem Relation Age of Onset   Heart failure Father    Glaucoma Mother    Osteoporosis Brother    Heart disease Brother 73       stent   Hypertension Brother    Hyperlipidemia Brother    Diabetes Brother    Colon cancer Neg Hx    Stomach cancer Neg Hx    Rectal cancer Neg Hx    Social History   Socioeconomic History   Marital status: Single    Spouse name: Not on file   Number of children: 0   Years of education: 10   Highest education level: 10th grade  Occupational History   Occupation: Retired    Comment: retired Chartered loss adjuster  Tobacco Use   Smoking status: Former    Current packs/day: 0.00    Average packs/day: 1 pack/day for 50.0 years (50.0 ttl pk-yrs)    Types: Cigarettes    Start date: 08/15/1959     Quit date: 08/14/2009    Years since quitting: 13.6   Smokeless tobacco: Never  Vaping Use   Vaping status: Never Used  Substance and Sexual Activity   Alcohol use: No   Drug use: No   Sexual activity: Not Currently  Other Topics Concern   Not on file  Social History Narrative   Retired Chartered loss adjuster   Lives at home with Brother, Lonnie Hurley   Social Determinants of Health   Financial Resource Strain: Low Risk  (10/31/2022)   Overall Financial Resource Strain (CARDIA)    Difficulty of Paying Living Expenses: Not hard at all  Food Insecurity: No Food Insecurity (10/31/2022)   Hunger Vital Sign    Worried About Running Out of Food in the Last Year: Never true    Ran Out of Food in the Last Year: Never true  Transportation Needs: No Transportation Needs (10/31/2022)   PRAPARE - Administrator, Civil Service (Medical): No    Lack of Transportation (Non-Medical): No  Physical Activity: Insufficiently Active (10/31/2022)   Exercise Vital Sign    Days of Exercise per Week: 3 days    Minutes of Exercise per Session: 30 min  Stress: No Stress Concern Present (10/31/2022)  [employer] of Occupational Health - Occupational Stress Questionnaire    Feeling of Stress : Not at all  Social Connections: Moderately Isolated (10/31/2022)   Social Connection and Isolation Panel [NHANES]    Frequency of Communication with Friends and Family: More than three times a week    Frequency of Social Gatherings with Friends and Family: More than three times a week    Attends Religious Services: More than 4 times per year    Active Member of Clubs or Organizations: No    Attends Banker Meetings: Never    Marital Status: Never married   Past Surgical History:  Procedure Laterality Date   CATARACT EXTRACTION  2007   right eye   CATARACT EXTRACTION Left 03/21/15   COLONOSCOPY  02/28/2014   HERNIA REPAIR  1980   KNEE SURGERY  02-28-2008   replaced inside right knee    Left Knee Repair  11-10-2006   NOSE SURGERY     POLYPECTOMY     colon polyps   SKIN BIOPSY  10-08-2006   left ear   Past Surgical History:  Procedure Laterality Date   CATARACT EXTRACTION  2007   right eye   CATARACT EXTRACTION Left 03/21/15   COLONOSCOPY  02/28/2014   HERNIA REPAIR  1980   KNEE SURGERY  02-28-2008   replaced inside right knee   Left Knee Repair  11-10-2006   NOSE SURGERY     POLYPECTOMY     colon polyps   SKIN BIOPSY  10-08-2006   left ear   Past Medical History:  Diagnosis Date   BPH (benign prostatic hypertrophy)    Dr. Earlene Plater / Dr. Etta Grandchild  - Urologist    Cancer [facility])    lip-mole surgery   Cataract    bilateral   Colon polyps    Constipation    occasional - miralax prn   DDD (degenerative disc disease)    Hearing loss    left ear, no hearing aids   Hyperlipidemia    Hypertension    Leukoplakia    Microscopic hematuria    negative work up with Urology in the past.    OAB (overactive bladder)    with past percutaneous tibial nerve stimulation therapy   Thrombocytopenia (HCC)    Tinnitus    Tobacco abuse    Vitamin B 12 deficiency    Vitamin D deficiency    Ht 5\' 6"  (1.676 m)   BMI 37.12 kg/m   Opioid Risk Score:   Fall Risk Score:  `1  Depression screen PHQ 2/9     02/11/2023    1:51 PM 01/28/2023    1:17 PM 10/31/2022    8:57 AM 08/07/2022    2:24 PM 05/01/2022    2:11 PM 01/06/2022   10:25 AM 10/21/2021    1:24 PM  Depression screen PHQ 2/9  Decreased Interest 0 0 0 1 0 1 1  Down, Depressed, Hopeless 0 0 0 0 0 1 1  PHQ - 2 Score 0 0 0 1 0 2 2  Altered sleeping  1  1 0 0 0  Tired, decreased energy  1  1 0 1 1  Change in appetite  0  0 0 0 0  Feeling bad or failure about yourself   0  0 0 0 0  Trouble concentrating  0  0 0 0 0  Moving slowly or fidgety/restless  0  2 0 1 0  Suicidal thoughts  0  0 0  0 0  PHQ-9 Score  2  5 0 4 3  Difficult doing work/chores  Somewhat difficult  Somewhat difficult Not difficult at all Somewhat difficult  Somewhat difficult      Review of Systems  Musculoskeletal:  Positive for gait problem.  All other systems reviewed and are negative.     Objective:   Physical Exam  General: No acute distress HEENT: NCAT, EOMI, oral membranes moist Cards: reg rate  Chest: normal effort Abdomen: Soft, NT, ND Skin: dry, intact Extremities: no edema Psych: pleasant and appropriate  Skin: intact Neuro: Alert and oriented x 3. Normal insight and awareness. Intact Memory. Normal language and speech. Cranial nerve exam unremarkable. MMT: 5/5. Decreased LT/PP in both feet. .   Musculoskeletal: Patient with antalgia bilateral knees although he is favoring the right knee more today.  There is mild joint effusion notable in the right knee especially along the medial aspect.  Crepitus with range of motion.  Assessment & Plan:  Left greater than right knee pain d/t osteoarthritis. Hx of left medial-parital knee replacement 15+ years ago. Recent xray demonstrates OA at knee. I suspect majority of pain is originating at prosthesis however. Lumbar spondylosis with intermittent radiculitis Idiopathic peripheral neuropathy       Plan: Pt with meloxicam? He will check on rx.  He is probably better off staying away from NSAIDs given his age and other risk factors. Right knee pain more severe now. Will set up for steroid injection 3.  Left knee steroid injection with great results.  4.  May need ortho opinion for TKA at some point. He is hesitant to explore.  5.  Consider zilretta      At least 20 total minutes were spent in examination of patient, assessment of pertinent data,  formulation of a treatment plan, and in discussion with patient, family, and/or treating providers.  Follow up with me as available.

## 2023-04-15 NOTE — Patient Instructions (Signed)
ALWAYS FEEL FREE TO CALL OUR OFFICE WITH ANY PROBLEMS OR QUESTIONS ([phone])  **PLEASE NOTE** ALL MEDICATION REFILL REQUESTS (INCLUDING CONTROLLED SUBSTANCES) NEED TO BE MADE AT LEAST 7 DAYS PRIOR TO REFILL BEING DUE. ANY REFILL REQUESTS INSIDE THAT TIME FRAME MAY RESULT IN DELAYS IN RECEIVING YOUR PRESCRIPTION.                    

## 2023-05-04 ENCOUNTER — Ambulatory Visit (INDEPENDENT_AMBULATORY_CARE_PROVIDER_SITE_OTHER): Payer: PPO

## 2023-05-04 DIAGNOSIS — E538 Deficiency of other specified B group vitamins: Secondary | ICD-10-CM

## 2023-05-04 NOTE — Progress Notes (Signed)
B12 injection given to patient in left deltoid and tolerated well.

## 2023-05-13 ENCOUNTER — Encounter: Payer: Self-pay | Admitting: Physical Medicine & Rehabilitation

## 2023-05-13 ENCOUNTER — Encounter: Payer: PPO | Attending: Physical Medicine and Rehabilitation | Admitting: Physical Medicine & Rehabilitation

## 2023-05-13 VITALS — BP 119/73 | HR 79 | Ht 66.0 in | Wt 234.0 lb

## 2023-05-13 DIAGNOSIS — M170 Bilateral primary osteoarthritis of knee: Secondary | ICD-10-CM | POA: Diagnosis not present

## 2023-05-13 MED ORDER — BETAMETHASONE SOD PHOS & ACET 6 (3-3) MG/ML IJ SUSP
12.0000 mg | Freq: Once | INTRAMUSCULAR | Status: AC
  Administered 2023-05-13: 12 mg via INTRA_ARTICULAR

## 2023-05-13 MED ORDER — LIDOCAINE HCL 1 % IJ SOLN
5.0000 mL | Freq: Once | INTRAMUSCULAR | Status: AC
  Administered 2023-05-13: 5 mL

## 2023-05-13 NOTE — Progress Notes (Signed)
PROCEDURE NOTE  DIAGNOSIS: Primary osteoarthritis of both knees - Plan: lidocaine (XYLOCAINE) 1 % (with pres) injection 5 mL, betamethasone acetate-betamethasone sodium phosphate (CELESTONE) injection 12 mg  INTERVENTION:  major joint     After informed consent and preparation of the skin with betadine and isopropyl alcohol, I injected 6mg  (1cc) of celestone and 4cc of 1% lidocaine into the right knee via anterolateral approach. Additionally, aspiration was performed prior to injection. The patient tolerated well, and no complications were encountered. Afterward the area was cleaned and dressed. Post- injection instructions were provided.      Ranelle Oyster, MD, [facility] 05/13/2023

## 2023-05-13 NOTE — Patient Instructions (Signed)
ALWAYS FEEL FREE TO CALL OUR OFFICE WITH ANY PROBLEMS OR QUESTIONS ([phone])  **PLEASE NOTE** ALL MEDICATION REFILL REQUESTS (INCLUDING CONTROLLED SUBSTANCES) NEED TO BE MADE AT LEAST 7 DAYS PRIOR TO REFILL BEING DUE. ANY REFILL REQUESTS INSIDE THAT TIME FRAME MAY RESULT IN DELAYS IN RECEIVING YOUR PRESCRIPTION.                    

## 2023-05-26 DIAGNOSIS — C44222 Squamous cell carcinoma of skin of right ear and external auricular canal: Secondary | ICD-10-CM | POA: Diagnosis not present

## 2023-05-26 DIAGNOSIS — Z85828 Personal history of other malignant neoplasm of skin: Secondary | ICD-10-CM | POA: Diagnosis not present

## 2023-05-27 DIAGNOSIS — N401 Enlarged prostate with lower urinary tract symptoms: Secondary | ICD-10-CM | POA: Diagnosis not present

## 2023-05-27 DIAGNOSIS — N200 Calculus of kidney: Secondary | ICD-10-CM | POA: Diagnosis not present

## 2023-05-27 DIAGNOSIS — N3281 Overactive bladder: Secondary | ICD-10-CM | POA: Diagnosis not present

## 2023-05-27 DIAGNOSIS — N3941 Urge incontinence: Secondary | ICD-10-CM | POA: Diagnosis not present

## 2023-05-27 DIAGNOSIS — R351 Nocturia: Secondary | ICD-10-CM | POA: Diagnosis not present

## 2023-05-29 DIAGNOSIS — E119 Type 2 diabetes mellitus without complications: Secondary | ICD-10-CM | POA: Diagnosis not present

## 2023-05-29 DIAGNOSIS — H02132 Senile ectropion of right lower eyelid: Secondary | ICD-10-CM | POA: Diagnosis not present

## 2023-05-29 DIAGNOSIS — H26493 Other secondary cataract, bilateral: Secondary | ICD-10-CM | POA: Diagnosis not present

## 2023-05-29 DIAGNOSIS — H43812 Vitreous degeneration, left eye: Secondary | ICD-10-CM | POA: Diagnosis not present

## 2023-05-29 DIAGNOSIS — Z961 Presence of intraocular lens: Secondary | ICD-10-CM | POA: Diagnosis not present

## 2023-05-29 LAB — HM DIABETES EYE EXAM

## 2023-06-03 ENCOUNTER — Encounter: Payer: PPO | Attending: Physical Medicine and Rehabilitation | Admitting: Physical Medicine & Rehabilitation

## 2023-06-04 ENCOUNTER — Ambulatory Visit (INDEPENDENT_AMBULATORY_CARE_PROVIDER_SITE_OTHER): Payer: PPO | Admitting: Family Medicine

## 2023-06-04 DIAGNOSIS — E538 Deficiency of other specified B group vitamins: Secondary | ICD-10-CM | POA: Diagnosis not present

## 2023-06-08 ENCOUNTER — Ambulatory Visit: Payer: PPO | Admitting: Family

## 2023-06-08 ENCOUNTER — Encounter: Payer: Self-pay | Admitting: Family

## 2023-06-08 VITALS — BP 150/79 | HR 84 | Temp 97.7°F | Ht 66.0 in | Wt 235.8 lb

## 2023-06-08 DIAGNOSIS — M79604 Pain in right leg: Secondary | ICD-10-CM

## 2023-06-08 DIAGNOSIS — R350 Frequency of micturition: Secondary | ICD-10-CM | POA: Diagnosis not present

## 2023-06-08 DIAGNOSIS — M4716 Other spondylosis with myelopathy, lumbar region: Secondary | ICD-10-CM

## 2023-06-08 DIAGNOSIS — E785 Hyperlipidemia, unspecified: Secondary | ICD-10-CM

## 2023-06-08 DIAGNOSIS — M79605 Pain in left leg: Secondary | ICD-10-CM

## 2023-06-08 DIAGNOSIS — E538 Deficiency of other specified B group vitamins: Secondary | ICD-10-CM | POA: Diagnosis not present

## 2023-06-08 DIAGNOSIS — L509 Urticaria, unspecified: Secondary | ICD-10-CM

## 2023-06-08 DIAGNOSIS — E1169 Type 2 diabetes mellitus with other specified complication: Secondary | ICD-10-CM | POA: Diagnosis not present

## 2023-06-08 DIAGNOSIS — E559 Vitamin D deficiency, unspecified: Secondary | ICD-10-CM | POA: Diagnosis not present

## 2023-06-08 DIAGNOSIS — N401 Enlarged prostate with lower urinary tract symptoms: Secondary | ICD-10-CM | POA: Diagnosis not present

## 2023-06-08 DIAGNOSIS — Z91018 Allergy to other foods: Secondary | ICD-10-CM

## 2023-06-08 DIAGNOSIS — I10 Essential (primary) hypertension: Secondary | ICD-10-CM | POA: Diagnosis not present

## 2023-06-08 MED ORDER — HYDROXYZINE PAMOATE 25 MG PO CAPS: 25.0000 mg | ORAL_CAPSULE | Freq: Three times a day (TID) | ORAL | 1 refills | Status: DC | PRN

## 2023-06-08 MED ORDER — GABAPENTIN 100 MG PO CAPS: 100.0000 mg | ORAL_CAPSULE | Freq: Every day | ORAL | 1 refills | Status: DC

## 2023-06-08 MED ORDER — ONETOUCH ULTRA VI STRP: ORAL_STRIP | 3 refills | Status: AC

## 2023-06-08 MED ORDER — TAMSULOSIN HCL 0.4 MG PO CAPS: 0.8000 mg | ORAL_CAPSULE | Freq: Every day | ORAL | 1 refills | Status: AC

## 2023-06-08 MED ORDER — SIMVASTATIN 40 MG PO TABS: 40.0000 mg | ORAL_TABLET | Freq: Every day | ORAL | 1 refills | Status: DC

## 2023-06-08 MED ORDER — EZETIMIBE 10 MG PO TABS: 10.0000 mg | ORAL_TABLET | Freq: Every day | ORAL | 1 refills | Status: DC

## 2023-06-08 NOTE — Patient Instructions (Signed)

## 2023-06-08 NOTE — Progress Notes (Signed)
Subjective:    Patient ID: Lonnie Hurley, male    DOB: 09-30-1940, 82 y.o.   MRN: [id number]  Chief Complaint  Patient presents with   Medical Management of Chronic Issues    4 month follow up    Pt presents to the office today for  chronic follow up.   He is followed by Urologists every 6 months for BPH. Followed by Cardiologists annually.  He is followed by Dermatologists every 6 months. He is followed by Ortho for osteoarthritis for bilateral knee pain and getting steroid injections.    He has aortic atherosclerosis and takes Zetia and Zocor. Hypertension This is a chronic problem. The current episode started more than 1 year ago. The problem has been resolved since onset. The problem is uncontrolled. Associated symptoms include malaise/fatigue. Pertinent negatives include no blurred vision, peripheral edema or shortness of breath. Risk factors for coronary artery disease include dyslipidemia, diabetes mellitus, obesity and male gender. The current treatment provides moderate improvement.  Diabetes He presents for his follow-up diabetic visit. He has type 2 diabetes mellitus. Associated symptoms include foot paresthesias. Pertinent negatives for diabetes include no blurred vision. Symptoms are stable. Diabetic complications include peripheral neuropathy. Risk factors for coronary artery disease include dyslipidemia, diabetes mellitus, hypertension, male sex and sedentary lifestyle. He is following a generally unhealthy diet. His overall blood glucose range is 110-130 mg/dl.  Arthritis Presents for follow-up visit. He complains of pain and stiffness. Affected locations include the left knee, right knee, left MCP and right MCP. His pain is at a severity of 4/10.  Benign Prostatic Hypertrophy This is a chronic problem. The current episode started more than 1 year ago. Irritative symptoms include nocturia (3). Past treatments include tamsulosin (vibegron). The treatment provided moderate  relief.      Review of Systems  Constitutional:  Positive for malaise/fatigue.  Eyes:  Negative for blurred vision.  Respiratory:  Negative for shortness of breath.   Genitourinary:  Positive for nocturia (3).  Musculoskeletal:  Positive for stiffness.  All other systems reviewed and are negative.      Objective:   Physical Exam Vitals reviewed.  Constitutional:      General: He is not in acute distress.    Appearance: He is well-developed. He is obese.  HENT:     Head: Normocephalic.     Right Ear: Tympanic membrane normal.     Left Ear: Tympanic membrane normal.  Eyes:     General:        Right eye: No discharge.        Left eye: No discharge.     Pupils: Pupils are equal, round, and reactive to light.  Neck:     Thyroid: No thyromegaly.  Cardiovascular:     Rate and Rhythm: Normal rate and regular rhythm.     Heart sounds: Normal heart sounds. No murmur heard. Pulmonary:     Effort: Pulmonary effort is normal. No respiratory distress.     Breath sounds: Normal breath sounds. No wheezing.  Abdominal:     General: Bowel sounds are normal. There is no distension.     Palpations: Abdomen is soft.     Tenderness: There is no abdominal tenderness.  Musculoskeletal:        General: No tenderness. Normal range of motion.     Cervical back: Normal range of motion and neck supple.     Right lower leg: Edema (2+) present.     Left lower leg: Edema (2+) present.  Skin:    General: Skin is warm and dry.     Findings: No erythema or rash.  Neurological:     Mental Status: He is alert and oriented to person, place, and time.     Cranial Nerves: No cranial nerve deficit.     Deep Tendon Reflexes: Reflexes are normal and symmetric.  Psychiatric:        Behavior: Behavior normal.        Thought Content: Thought content normal.        Judgment: Judgment normal.     Diabetic Foot Exam - Simple   No data filed      BP (!) 150/79   Pulse 84   Temp 97.7 F (36.5 C)    Ht 5\' 6"  (1.676 m)   Wt 235 lb 12.8 oz (107 kg)   SpO2 94%   BMI 38.06 kg/m      Assessment & Plan:   Lonnie Hurley comes in today with chief complaint of Medical Management of Chronic Issues (4 month follow up )   Diagnosis and orders addressed:  1. Hyperlipidemia, unspecified hyperlipidemia type - ezetimibe (ZETIA) 10 MG tablet; Take 1 tablet (10 mg total) by mouth daily.  Dispense: 90 tablet; Refill: 1 - simvastatin (ZOCOR) 40 MG tablet; Take 1 tablet (40 mg total) by mouth at bedtime.  Dispense: 90 tablet; Refill: 1 - CBC with Differential/Platelet - CMP14+EGFR - Lipid panel  2. Pain in both lower extremities - gabapentin (NEURONTIN) 100 MG capsule; Take 1 capsule (100 mg total) by mouth at bedtime.  Dispense: 90 capsule; Refill: 1 - CBC with Differential/Platelet - CMP14+EGFR  3. Allergy to alpha-gal - hydrOXYzine (VISTARIL) 25 MG capsule; Take 1 capsule (25 mg total) by mouth 3 (three) times daily as needed.  Dispense: 90 capsule; Refill: 1 - CBC with Differential/Platelet - CMP14+EGFR  4. Hives - hydrOXYzine (VISTARIL) 25 MG capsule; Take 1 capsule (25 mg total) by mouth 3 (three) times daily as needed.  Dispense: 90 capsule; Refill: 1 - CBC with Differential/Platelet - CMP14+EGFR  5. Benign prostatic hyperplasia with urinary frequency - tamsulosin (FLOMAX) 0.4 MG CAPS capsule; Take 2 capsules (0.8 mg total) by mouth at bedtime.  Dispense: 180 capsule; Refill: 1 - CBC with Differential/Platelet - CMP14+EGFR  6. Primary hypertension (Primary) - CBC with Differential/Platelet - CMP14+EGFR  7. Type 2 diabetes mellitus with other specified complication, without long-term current use of insulin (HCC) - CBC with Differential/Platelet - CMP14+EGFR - Bayer DCA Hb A1c Waived - glucose blood (ONETOUCH ULTRA) test strip; Test BS daily Dx E11.9  Dispense: 100 strip; Refill: 3  8. Vitamin B 12 deficiency - CBC with Differential/Platelet - CMP14+EGFR - Vitamin  B12  9. Vitamin D deficiency - CBC with Differential/Platelet - CMP14+EGFR - VITAMIN D 25 Hydroxy (Vit-D Deficiency, Fractures)  10. Lumbar spondylosis with myelopathy - CBC with Differential/Platelet - CMP14+EGFR    Labs pending Keep follow up with specialists  Continue all medications  Health Maintenance reviewed Diet and exercise encouraged  Follow up plan: 4 months    Jannifer Rodney, FNP

## 2023-06-19 ENCOUNTER — Ambulatory Visit (INDEPENDENT_AMBULATORY_CARE_PROVIDER_SITE_OTHER): Payer: PPO | Admitting: Family Medicine

## 2023-06-19 ENCOUNTER — Encounter: Payer: Self-pay | Admitting: Family Medicine

## 2023-06-19 VITALS — BP 130/67 | HR 77 | Temp 97.3°F | Ht 66.0 in | Wt 233.2 lb

## 2023-06-19 DIAGNOSIS — R051 Acute cough: Secondary | ICD-10-CM

## 2023-06-19 DIAGNOSIS — J069 Acute upper respiratory infection, unspecified: Secondary | ICD-10-CM

## 2023-06-19 MED ORDER — DOXYCYCLINE HYCLATE 100 MG PO TABS: 100.0000 mg | ORAL_TABLET | Freq: Two times a day (BID) | ORAL | 0 refills | Status: AC

## 2023-06-19 NOTE — Progress Notes (Signed)
 Subjective:  Patient ID: Lonnie Hurley, male    DOB: 1941/01/25, 83 y.o.   MRN: [id number]  Patient Care Team: Lavell Bari LABOR, FNP as PCP - General (Family Medicine) Twana Jeneal DASEN, MD (Hematology and Oncology) Watt Rush, MD as Attending Physician (Urology) Williford, Manus HERO, MD as Consulting Physician (Dermatology) Octavia Bruckner, MD as Consulting Physician (Ophthalmology) Unice Pac, MD as Consulting Physician (Neurosurgery)   Chief Complaint:  Cough, Nasal Congestion, and Sore Throat (X 1 week )   HPI: Lonnie Hurley is a 83 y.o. male presenting on 06/19/2023 for Cough, Nasal Congestion, and Sore Throat (X 1 week )   Discussed the use of AI scribe software for clinical note transcription with the patient, who gave verbal consent to proceed.  History of Present Illness   The patient presents with a week-long history of symptoms suggestive of an upper respiratory tract infection. He reports fatigue and productive cough, with yellow sputum. The patient has been managing symptoms with home remedies, including gargling warm salt water, and over-the-counter medications such as Mucinex and Robitussin. He notes occasional shortness of breath but denies fever and chills.  The patient also mentions a recent potential exposure to COVID-19 at a Christmas party, where several attendees later tested positive. However, the onset of symptoms was more than five days ago, making antiviral therapy no longer an option.  The patient also mentions an adverse reaction to prednisone , which he reports caused increased urination. He is currently on tamsulosin , taken once in the morning and once at night.         Relevant past medical, surgical, family, and social history reviewed and updated as indicated.  Allergies and medications reviewed and updated. Data reviewed: Chart in Epic.   Past Medical History:  Diagnosis Date   BPH (benign prostatic hypertrophy)    Dr. Nicholaus / Dr. Kathlynn  -  Urologist    [facility])    lip-mole surgery   Cataract    bilateral   Colon polyps    Constipation    occasional - miralax  prn   DDD (degenerative disc disease)    Hearing loss    left ear, no hearing aids   Hyperlipidemia    Hypertension    Leukoplakia    Microscopic hematuria    negative work up with Urology in the past.    OAB (overactive bladder)    with past percutaneous tibial nerve stimulation therapy   Thrombocytopenia (HCC)    Tinnitus    Tobacco abuse    Vitamin B 12 deficiency    Vitamin D  deficiency     Past Surgical History:  Procedure Laterality Date   CATARACT EXTRACTION  2007   right eye   CATARACT EXTRACTION Left 03/21/15   COLONOSCOPY  02/28/2014   HERNIA REPAIR  1980   KNEE SURGERY  02-28-2008   replaced inside right knee   Left Knee Repair  11-10-2006   NOSE SURGERY     POLYPECTOMY     colon polyps   SKIN BIOPSY  10-08-2006   left ear    Social History   Socioeconomic History   Marital status: Single    Spouse name: Not on file   Number of children: 0   Years of education: 10   Highest education level: 10th grade  Occupational History   Occupation: Retired    Comment: retired chartered loss adjuster  Tobacco Use   Smoking status: Former    Current packs/day: 0.00  Average packs/day: 1 pack/day for 50.0 years (50.0 ttl pk-yrs)    Types: Cigarettes    Start date: 08/15/1959    Quit date: 08/14/2009    Years since quitting: 13.8   Smokeless tobacco: Never  Vaping Use   Vaping status: Never Used  Substance and Sexual Activity   Alcohol use: No   Drug use: No   Sexual activity: Not Currently  Other Topics Concern   Not on file  Social History Narrative   Retired Chartered loss adjuster   Lives at home with Brother, Desiderio   Social Drivers of Health   Financial Resource Strain: Low Risk  (10/31/2022)   Overall Financial Resource Strain (CARDIA)    Difficulty of Paying Living Expenses: Not hard at all  Food Insecurity: No Food  Insecurity (10/31/2022)   Hunger Vital Sign    Worried About Running Out of Food in the Last Year: Never true    Ran Out of Food in the Last Year: Never true  Transportation Needs: No Transportation Needs (10/31/2022)   PRAPARE - Administrator, Civil Service (Medical): No    Lack of Transportation (Non-Medical): No  Physical Activity: Insufficiently Active (10/31/2022)   Exercise Vital Sign    Days of Exercise per Week: 3 days    Minutes of Exercise per Session: 30 min  Stress: No Stress Concern Present (10/31/2022)   [employer] of Occupational Health - Occupational Stress Questionnaire    Feeling of Stress : Not at all  Social Connections: Moderately Isolated (10/31/2022)   Social Connection and Isolation Panel [NHANES]    Frequency of Communication with Friends and Family: More than three times a week    Frequency of Social Gatherings with Friends and Family: More than three times a week    Attends Religious Services: More than 4 times per year    Active Member of [employer] or Organizations: No    Attends Banker Meetings: Never    Marital Status: Never married  Intimate Partner Violence: Not At Risk (10/31/2022)   Humiliation, Afraid, Rape, and Kick questionnaire    Fear of Current or Ex-Partner: No    Emotionally Abused: No    Physically Abused: No    Sexually Abused: No    Outpatient Encounter Medications as of 06/19/2023  Medication Sig   acetaminophen  (TYLENOL ) 500 MG tablet Take 1 tablet (500 mg total) by mouth every 6 (six) hours as needed.   Calcium Carb-Cholecalciferol (CALCIUM 600 + D PO) Take by mouth 2 (two) times daily.    doxycycline  (VIBRA -TABS) 100 MG tablet Take 1 tablet (100 mg total) by mouth 2 (two) times daily for 10 days. 1 po bid   EPINEPHrine  0.3 mg/0.3 mL IJ SOAJ injection Inject 0.3 mg into the muscle as needed for anaphylaxis.   ezetimibe  (ZETIA ) 10 MG tablet Take 1 tablet (10 mg total) by mouth daily.   gabapentin  (NEURONTIN ) 100  MG capsule Take 1 capsule (100 mg total) by mouth at bedtime.   Glucosamine-Chondroit-Vit C-Mn (GLUCOSAMINE 1500 COMPLEX PO) Take by mouth 2 (two) times daily.    glucose blood (ONETOUCH ULTRA) test strip Test BS daily Dx E11.9   hydrOXYzine  (VISTARIL ) 25 MG capsule Take 1 capsule (25 mg total) by mouth 3 (three) times daily as needed.   Multiple Vitamins-Minerals (MULTIVITAMIN WITH MINERALS) tablet Take 1 tablet by mouth daily.   polyethylene glycol powder (MIRALAX ) 17 GM/SCOOP powder Take 17 g by mouth as needed for moderate constipation.   ramipril  (  ALTACE ) 10 MG capsule Take 1 capsule (10 mg total) by mouth daily.   simvastatin  (ZOCOR ) 40 MG tablet Take 1 tablet (40 mg total) by mouth at bedtime.   tamsulosin  (FLOMAX ) 0.4 MG CAPS capsule Take 2 capsules (0.8 mg total) by mouth at bedtime.   Vibegron (GEMTESA) 75 MG TABS Take 1 tablet by mouth as needed (urinary leakage).   Facility-Administered Encounter Medications as of 06/19/2023  Medication   cyanocobalamin  ((VITAMIN B-12)) injection 1,000 mcg    Allergies  Allergen Reactions   Clavulanic Acid    Diclofenac  Swelling   Penicillins Other (See Comments)    unknown   Aspirin Other (See Comments)    Causes Blood in urine   Crestor [Rosuvastatin Calcium] Other (See Comments)    Myalgia, joint problem   Gabapentin  Other (See Comments)    Blurry vision Pt able to take in small doses    Hydrocodone Other (See Comments)    Constipation     Lyrica  [Pregabalin ]     Blurry vision     Pertinent ROS per HPI, otherwise unremarkable      Objective:  BP 130/67   Pulse 77   Temp (!) 97.3 F (36.3 C)   Ht 5' 6 (1.676 m)   Wt 233 lb 3.2 oz (105.8 kg)   SpO2 93%   BMI 37.64 kg/m    Wt Readings from Last 3 Encounters:  06/19/23 233 lb 3.2 oz (105.8 kg)  06/08/23 235 lb 12.8 oz (107 kg)  05/13/23 234 lb (106.1 kg)    Physical Exam Vitals and nursing note reviewed.  Constitutional:      General: He is not in acute  distress.    Appearance: Normal appearance. He is well-developed and well-groomed. He is morbidly obese. He is not ill-appearing, toxic-appearing or diaphoretic.  HENT:     Head: Normocephalic and atraumatic.     Jaw: There is normal jaw occlusion.     Right Ear: Hearing, tympanic membrane, ear canal and external ear normal.     Left Ear: Hearing, tympanic membrane, ear canal and external ear normal.     Nose: Congestion present.     Mouth/Throat:     Lips: Pink.     Mouth: Mucous membranes are moist.     Pharynx: Oropharynx is clear. Uvula midline.  Eyes:     General: Lids are normal.     Conjunctiva/sclera: Conjunctivae normal.     Pupils: Pupils are equal, round, and reactive to light.  Neck:     Trachea: Trachea and phonation normal.  Cardiovascular:     Rate and Rhythm: Normal rate and regular rhythm.     Chest Wall: PMI is not displaced.     Pulses: Normal pulses.     Heart sounds: Normal heart sounds. No murmur heard.    No friction rub. No gallop.  Pulmonary:     Effort: Pulmonary effort is normal. No respiratory distress.     Breath sounds: No stridor. Wheezing and rhonchi present. No rales.     Comments: Congested cough Chest:     Chest wall: No tenderness.  Abdominal:     General: Bowel sounds are normal.     Palpations: Abdomen is soft.  Musculoskeletal:        General: Normal range of motion.     Cervical back: Neck supple.     Right lower leg: No edema.     Left lower leg: No edema.  Skin:    General: Skin is  warm and dry.     Capillary Refill: Capillary refill takes less than 2 seconds.     Coloration: Skin is not cyanotic, jaundiced or pale.     Findings: No rash.  Neurological:     General: No focal deficit present.     Mental Status: He is alert and oriented to person, place, and time.     Sensory: Sensation is intact.     Motor: Motor function is intact.     Coordination: Coordination is intact.     Gait: Gait abnormal (slow).     Deep Tendon  Reflexes: Reflexes are normal and symmetric.  Psychiatric:        Attention and Perception: Attention and perception normal.        Mood and Affect: Mood and affect normal.        Speech: Speech normal.        Behavior: Behavior normal. Behavior is cooperative.        Thought Content: Thought content normal.        Cognition and Memory: Cognition and memory normal.        Judgment: Judgment normal.       Results for orders placed or performed in visit on 06/03/23  HM DIABETES EYE EXAM   Collection Time: 05/29/23 12:00 AM  Result Value Ref Range   HM Diabetic Eye Exam No Retinopathy No Retinopathy       Pertinent labs & imaging results that were available during my care of the patient were reviewed by me and considered in my medical decision making.  Assessment & Plan:  Roarke was seen today for cough, nasal congestion and sore throat.  Diagnoses and all orders for this visit:  Acute cough -     COVID-19, Flu A+B and RSV  URI with cough and congestion -     doxycycline  (VIBRA -TABS) 100 MG tablet; Take 1 tablet (100 mg total) by mouth 2 (two) times daily for 10 days. 1 po bid     Assessment and Plan    Upper Respiratory Infection Nicco presents with symptoms of an upper respiratory infection, including yellow mucus production, mild dyspnea, and fatigue for one week. No fever or chills. Recent COVID-19 exposure, but symptoms onset too long ago for antiviral therapy. Using Mucinex, Robitussin, and salt water gargles with some relief. - Administer Decadron  - Prescribe doxycycline  100 mg BID for 10 days - Continue Mucinex with increased hydration  General Health Maintenance Discussed hydration and continuation of current medications. No new health maintenance issues. - Encourage adequate water intake - Continue ezetimibe  and tamsulosin  as prescribed.          Continue all other maintenance medications.  Follow up plan: Return if symptoms worsen or fail to  improve.   Continue healthy lifestyle choices, including diet (rich in fruits, vegetables, and lean proteins, and low in salt and simple carbohydrates) and exercise (at least 30 minutes of moderate physical activity daily).  Educational handout given for URI  The above assessment and management plan was discussed with the patient. The patient verbalized understanding of and has agreed to the management plan. Patient is aware to call the clinic if they develop any new symptoms or if symptoms persist or worsen. Patient is aware when to return to the clinic for a follow-up visit. Patient educated on when it is appropriate to go to the emergency department.   Rosaline Bruns, FNP-C [facility] [phone]

## 2023-06-20 LAB — COVID-19, FLU A+B AND RSV
Influenza A, NAA: NOT DETECTED
Influenza B, NAA: NOT DETECTED
RSV, NAA: NOT DETECTED
SARS-CoV-2, NAA: DETECTED — AB

## 2023-06-25 DIAGNOSIS — R3121 Asymptomatic microscopic hematuria: Secondary | ICD-10-CM | POA: Diagnosis not present

## 2023-06-25 DIAGNOSIS — R351 Nocturia: Secondary | ICD-10-CM | POA: Diagnosis not present

## 2023-06-25 DIAGNOSIS — N401 Enlarged prostate with lower urinary tract symptoms: Secondary | ICD-10-CM | POA: Diagnosis not present

## 2023-06-25 DIAGNOSIS — N3281 Overactive bladder: Secondary | ICD-10-CM | POA: Diagnosis not present

## 2023-06-26 ENCOUNTER — Telehealth: Payer: Self-pay

## 2023-06-26 NOTE — Telephone Encounter (Signed)
 Copied from CRM [id number]. Topic: Clinical - Lab/Test Results >> Jun 26, 2023  1:44 PM [name] wrote: Reason for CRM: Pt is calling in about the xrays and wants to know the results requesting call back at [phone]

## 2023-07-06 ENCOUNTER — Ambulatory Visit (INDEPENDENT_AMBULATORY_CARE_PROVIDER_SITE_OTHER): Payer: PPO | Admitting: *Deleted

## 2023-07-06 DIAGNOSIS — E538 Deficiency of other specified B group vitamins: Secondary | ICD-10-CM

## 2023-07-06 NOTE — Progress Notes (Signed)
Patient is in office today for a nurse visit for B12 Injection. Patient Injection was given in the  Left deltoid. Patient tolerated injection well.

## 2023-07-22 ENCOUNTER — Encounter: Payer: PPO | Attending: Physical Medicine and Rehabilitation | Admitting: Physical Medicine & Rehabilitation

## 2023-07-22 ENCOUNTER — Encounter: Payer: Self-pay | Admitting: Physical Medicine & Rehabilitation

## 2023-07-22 VITALS — BP 147/74 | HR 81 | Ht 66.0 in | Wt 236.0 lb

## 2023-07-22 DIAGNOSIS — M170 Bilateral primary osteoarthritis of knee: Secondary | ICD-10-CM | POA: Insufficient documentation

## 2023-07-22 NOTE — Patient Instructions (Signed)
SUPPLEMENTS USEFUL FOR OSTEOARTHRITIS: OMEGA 3 FATTY ACIDS, TURMERIC, GINGER, TART CHERRY EXTRACT, CELERY SEED, GLUCOSAMINE WITH CHONDROITIN   ?

## 2023-07-22 NOTE — Progress Notes (Signed)
 Subjective:    Patient ID: Lonnie Hurley, male    DOB: 1940-12-26, 83 y.o.   MRN: [id number]  HPI  Lonnie Hurley is here in follow up of his chronic knee pain. We injected his left knee in August and the right knee in November. He's had good results with the right knee until just now. He also feels that his legs are weak and that his right knee is creaking . He can stand for about 5 minutes or so before he feels that he needs to sit down. He walks regularly but doesn't do strengthening or regular stretching.   He does take daily glucosamine and calcium for his joints and bones.  He feels that glucosamine has helped his knees sustain as long as they have.  He feels that if he does rest after he has been up on his legs for a while he is able to get back and do some more later on in the day.     Pain Inventory Average Pain 5 Pain Right Now 4 My pain is dull and aching  In the last 24 hours, has pain interfered with the following? General activity 0 Relation with others 0 Enjoyment of life 6 What TIME of day is your pain at its worst? varies Sleep (in general) Good  Pain is worse with: walking and standing Pain improves with: injections Relief from Meds:  na  Family History  Problem Relation Age of Onset   Heart failure Father    Glaucoma Mother    Osteoporosis Brother    Heart disease Brother 78       stent   Hypertension Brother    Hyperlipidemia Brother    Diabetes Brother    Colon cancer Neg Hx    Stomach cancer Neg Hx    Rectal cancer Neg Hx    Social History   Socioeconomic History   Marital status: Single    Spouse name: Not on file   Number of children: 0   Years of education: 10   Highest education level: 10th grade  Occupational History   Occupation: Retired    Comment: retired chartered loss adjuster  Tobacco Use   Smoking status: Former    Current packs/day: 0.00    Average packs/day: 1 pack/day for 50.0 years (50.0 ttl pk-yrs)    Types:  Cigarettes    Start date: 08/15/1959    Quit date: 08/14/2009    Years since quitting: 13.9   Smokeless tobacco: Never  Vaping Use   Vaping status: Never Used  Substance and Sexual Activity   Alcohol use: No   Drug use: No   Sexual activity: Not Currently  Other Topics Concern   Not on file  Social History Narrative   Retired Chartered loss adjuster   Lives at home with Brother, Desiderio   Social Drivers of Health   Financial Resource Strain: Low Risk  (10/31/2022)   Overall Financial Resource Strain (CARDIA)    Difficulty of Paying Living Expenses: Not hard at all  Food Insecurity: No Food Insecurity (10/31/2022)   Hunger Vital Sign    Worried About Running Out of Food in the Last Year: Never true    Ran Out of Food in the Last Year: Never true  Transportation Needs: No Transportation Needs (10/31/2022)   PRAPARE - Administrator, Civil Service (Medical): No    Lack of Transportation (Non-Medical): No  Physical Activity: Insufficiently Active (10/31/2022)   Exercise Vital Sign  Days of Exercise per Week: 3 days    Minutes of Exercise per Session: 30 min  Stress: No Stress Concern Present (10/31/2022)   [employer] of Occupational Health - Occupational Stress Questionnaire    Feeling of Stress : Not at all  Social Connections: Moderately Isolated (10/31/2022)   Social Connection and Isolation Panel [NHANES]    Frequency of Communication with Friends and Family: More than three times a week    Frequency of Social Gatherings with Friends and Family: More than three times a week    Attends Religious Services: More than 4 times per year    Active Member of Clubs or Organizations: No    Attends Banker Meetings: Never    Marital Status: Never married   Past Surgical History:  Procedure Laterality Date   CATARACT EXTRACTION  2007   right eye   CATARACT EXTRACTION Left 03/21/15   COLONOSCOPY  02/28/2014   HERNIA REPAIR  1980   KNEE SURGERY  02-28-2008    replaced inside right knee   Left Knee Repair  11-10-2006   NOSE SURGERY     POLYPECTOMY     colon polyps   SKIN BIOPSY  10-08-2006   left ear   Past Surgical History:  Procedure Laterality Date   CATARACT EXTRACTION  2007   right eye   CATARACT EXTRACTION Left 03/21/15   COLONOSCOPY  02/28/2014   HERNIA REPAIR  1980   KNEE SURGERY  02-28-2008   replaced inside right knee   Left Knee Repair  11-10-2006   NOSE SURGERY     POLYPECTOMY     colon polyps   SKIN BIOPSY  10-08-2006   left ear   Past Medical History:  Diagnosis Date   BPH (benign prostatic hypertrophy)    Dr. Nicholaus / Dr. Kathlynn  - Urologist    Cancer [facility])    lip-mole surgery   Cataract    bilateral   Colon polyps    Constipation    occasional - miralax  prn   DDD (degenerative disc disease)    Hearing loss    left ear, no hearing aids   Hyperlipidemia    Hypertension    Leukoplakia    Microscopic hematuria    negative work up with Urology in the past.    OAB (overactive bladder)    with past percutaneous tibial nerve stimulation therapy   Thrombocytopenia (HCC)    Tinnitus    Tobacco abuse    Vitamin B 12 deficiency    Vitamin D  deficiency    BP (!) 147/74   Pulse 81   Ht 5' 6 (1.676 m)   Wt 236 lb (107 kg)   SpO2 90%   BMI 38.09 kg/m   Opioid Risk Score:   Fall Risk Score:  `1  Depression screen [facility] 2/9     05/13/2023    2:49 PM 04/15/2023    1:56 PM 02/11/2023    1:51 PM 01/28/2023    1:17 PM 10/31/2022    8:57 AM 08/07/2022    2:24 PM 05/01/2022    2:11 PM  Depression screen PHQ 2/9  Decreased Interest 0 0 0 0 0 1 0  Down, Depressed, Hopeless 0 0 0 0 0 0 0  PHQ - 2 Score 0 0 0 0 0 1 0  Altered sleeping    1  1 0  Tired, decreased energy    1  1 0  Change in appetite    0  0 0  Feeling bad or failure about yourself     0  0 0  Trouble concentrating    0  0 0  Moving slowly or fidgety/restless    0  2 0  Suicidal thoughts    0  0 0  PHQ-9 Score    2  5 0  Difficult doing  work/chores    Somewhat difficult  Somewhat difficult Not difficult at all    Review of Systems  Musculoskeletal:  Positive for gait problem.       Right knee pain  All other systems reviewed and are negative.     Objective:   Physical Exam General: No acute distress.  He remains obese HEENT: NCAT, EOMI, oral membranes moist Cards: reg rate  Chest: normal effort Abdomen: Soft, NT, ND Skin: dry, intact Extremities: no edema Psych: pleasant and appropriate  Skin: intact Neuro: Alert and oriented x 3. Normal insight and awareness. Intact Memory. Normal language and speech. Cranial nerve exam unremarkable. MMT: 5/5. Decreased LT/PP in both feet. .   Musculoskeletal: P there is mild joint effusion on the right knee along the medial aspect in particular.  He has crepitus with active range of motion at the right knee.  There is antalgia with weightbearing on the right also.  Left knee is more fluid and he is able to bear weight more easily on that side. Assessment & Plan:  Left greater than right knee pain d/t osteoarthritis. Hx of left medial-parital knee replacement 15+ years ago. Recent xray demonstrates OA at knee. I suspect majority of pain is originating at prosthesis however. Lumbar spondylosis with intermittent radiculitis Idiopathic peripheral neuropathy       Plan: Provided knee stretches and strengthening exercises.  We went through these all individually.  I highlighted those that he should focus on. Right knee pain more severe now.  He had temporary results with standard steroid injections. He has had therapy and tried numerous oral meds. Will arrange for zilretta . 3.  Left knee steroid injection with great results.  4.  May need ortho opinion for right TKA at some point. He is hesitant to explore.  5.  List of supplements for OA were provided.        At least 20 total minutes were spent in examination of patient, assessment of pertinent data,  formulation of a treatment  plan, and in discussion with patient, family, and/or treating providers.  Follow up with me as available for zilretta 

## 2023-08-03 ENCOUNTER — Other Ambulatory Visit: Payer: Self-pay | Admitting: Family

## 2023-08-03 DIAGNOSIS — I10 Essential (primary) hypertension: Secondary | ICD-10-CM

## 2023-08-03 MED ORDER — RAMIPRIL 10 MG PO CAPS: 10.0000 mg | ORAL_CAPSULE | Freq: Every day | ORAL | 4 refills | Status: DC

## 2023-08-03 NOTE — Telephone Encounter (Unsigned)
Copied from CRM [id number]. Topic: Clinical - Medication Refill >> Aug 03, 2023  4:48 PM Higinio Roger wrote: Most Recent Primary Care Visit:  Provider: Julious Payer D  Department: [facility]  Visit Type: INJECTION  Date: 07/06/2023  Medication: ramipril (ALTACE) 10 MG capsule  Has the patient contacted their pharmacy? Yes (Agent: If no, request that the patient contact the pharmacy for the refill. If patient does not wish to contact the pharmacy document the reason why and proceed with request.) (Agent: If yes, when and what did the pharmacy advise?) Pharmacy stated they haven't received anything from [facility]  Is this the correct pharmacy for this prescription? Yes If no, delete pharmacy and type the correct one.  This is the patient's preferred pharmacy:   [facility] [place], [place] - [address] [place] [address] Phone: [phone] Fax: [phone]   Has the prescription been filled recently? Yes  Is the patient out of the medication? Yes  Has the patient been seen for an appointment in the last year OR does the patient have an upcoming appointment? No  Can we respond through MyChart? No. Patient would like a callback at [phone]  Agent: Please be advised that Rx refills may take up to 3 business days. We ask that you follow-up with your pharmacy.

## 2023-08-03 NOTE — Telephone Encounter (Signed)
Copied from CRM [id number]. Topic: Clinical - Medication Refill >> Aug 03, 2023 10:33 AM Eunice Blase wrote: Most Recent Primary Care Visit:  Provider: Julious Payer D  Department: [facility]  Visit Type: INJECTION  Date: 07/06/2023  Medication: ramipril (ALTACE) 10 MG capsule  Has the patient contacted their pharmacy? Yes (Agent: If no, request that the patient contact the pharmacy for the refill. If patient does not wish to contact the pharmacy document the reason why and proceed with request.) (Agent: If yes, when and what did the pharmacy advise?)  Is this the correct pharmacy for this prescription? Yes If no, delete pharmacy and type the correct one.  This is the patient's preferred pharmacy:   [facility] [place], [place] - [address] [place] [address] Phone: [phone] Fax: [phone]   Has the prescription been filled recently? Yes  Is the patient out of the medication? Yes  Has the patient been seen for an appointment in the last year OR does the patient have an upcoming appointment? Yes  Can we respond through MyChart? Yes  Agent: Please be advised that Rx refills may take up to 3 business days. We ask that you follow-up with your pharmacy.

## 2023-08-03 NOTE — Telephone Encounter (Signed)
Copied from CRM [id number]. Topic: Clinical - Prescription Issue >> Aug 03, 2023  4:25 PM [name] wrote: Reason for CRM: Pt calling to follow up on prescription order for ramipril (ALTACE) 10 MG capsule. Pt would like a status update. Pt states he really needs the Medication.

## 2023-08-04 ENCOUNTER — Other Ambulatory Visit: Payer: Self-pay | Admitting: Family Medicine

## 2023-08-04 DIAGNOSIS — H93299 Other abnormal auditory perceptions, unspecified ear: Secondary | ICD-10-CM | POA: Diagnosis not present

## 2023-08-04 DIAGNOSIS — H6123 Impacted cerumen, bilateral: Secondary | ICD-10-CM | POA: Diagnosis not present

## 2023-08-04 DIAGNOSIS — I10 Essential (primary) hypertension: Secondary | ICD-10-CM

## 2023-08-04 MED ORDER — RAMIPRIL 10 MG PO CAPS: 10.0000 mg | ORAL_CAPSULE | Freq: Every day | ORAL | 4 refills | Status: DC

## 2023-08-06 ENCOUNTER — Ambulatory Visit: Payer: PPO

## 2023-08-06 ENCOUNTER — Ambulatory Visit (INDEPENDENT_AMBULATORY_CARE_PROVIDER_SITE_OTHER): Payer: PPO

## 2023-08-06 DIAGNOSIS — E538 Deficiency of other specified B group vitamins: Secondary | ICD-10-CM

## 2023-08-06 NOTE — Progress Notes (Signed)
B12 injection given to patient and tolerated well.  

## 2023-08-18 ENCOUNTER — Ambulatory Visit (INDEPENDENT_AMBULATORY_CARE_PROVIDER_SITE_OTHER)

## 2023-08-18 ENCOUNTER — Encounter: Payer: Self-pay | Admitting: Family Medicine

## 2023-08-18 ENCOUNTER — Ambulatory Visit (INDEPENDENT_AMBULATORY_CARE_PROVIDER_SITE_OTHER): Admitting: Family Medicine

## 2023-08-18 VITALS — BP 146/74 | HR 82 | Temp 97.7°F | Ht 66.0 in | Wt 233.4 lb

## 2023-08-18 DIAGNOSIS — R059 Cough, unspecified: Secondary | ICD-10-CM

## 2023-08-18 DIAGNOSIS — J069 Acute upper respiratory infection, unspecified: Secondary | ICD-10-CM

## 2023-08-18 DIAGNOSIS — I700 Atherosclerosis of aorta: Secondary | ICD-10-CM | POA: Diagnosis not present

## 2023-08-18 MED ORDER — BENZONATATE 100 MG PO CAPS: 100.0000 mg | ORAL_CAPSULE | Freq: Three times a day (TID) | ORAL | 0 refills | Status: DC | PRN

## 2023-08-18 MED ORDER — AZITHROMYCIN 500 MG PO TABS: 500.0000 mg | ORAL_TABLET | Freq: Every day | ORAL | 0 refills | Status: AC

## 2023-08-18 NOTE — Progress Notes (Signed)
 Subjective:  Patient ID: Lonnie Hurley, male    DOB: 1941/05/22, 83 y.o.   MRN: [id number]  Patient Care Team: Junie Spencer, FNP as PCP - General (Family Medicine) Exie Parody, MD (Hematology and Oncology) Bjorn Pippin, MD as Attending Physician (Urology) Williford, Talmadge Coventry, MD as Consulting Physician (Dermatology) Sallye Lat, MD as Consulting Physician (Ophthalmology) Maeola Harman, MD as Consulting Physician (Neurosurgery)   Chief Complaint:  Cough (Patient states that since he was last seen on 1/3 he has been having on and off cough)   HPI: Lonnie Hurley is a 83 y.o. male presenting on 08/18/2023 for Cough (Patient states that since he was last seen on 1/3 he has been having on and off cough)   Discussed the use of AI scribe software for clinical note transcription with the patient, who gave verbal consent to proceed.  History of Present Illness   Lonnie Hurley is an 83 year old male who presents with a persistent cough.  He has been experiencing a persistent cough that occurs in spells, sometimes producing yellow or white sputum. The cough has been ongoing since at least January. No fever, chills, or confusion.  Initially, he managed the cough with Mucinex, starting with 1200 mg and later switching to 600 mg. The cough returns after a few days of stopping the medication. In January, he was prescribed doxycycline for ten days, which provided temporary relief, but the cough returned.  He has previously taken prednisone, which he felt was effective, but it caused frequent urination. He is concerned about medications affecting his kidneys, particularly referencing the past experience with prednisone.  He recalls an incident where he got caught in the rain, which he believes may have exacerbated his symptoms.          Relevant past medical, surgical, family, and social history reviewed and updated as indicated.  Allergies and medications reviewed and updated. Data  reviewed: Chart in Epic.   Past Medical History:  Diagnosis Date   BPH (benign prostatic hypertrophy)    Dr. Earlene Plater / Dr. Etta Grandchild  - Urologist    Cancer [facility])    lip-mole surgery   Cataract    bilateral   Colon polyps    Constipation    occasional - miralax prn   DDD (degenerative disc disease)    Hearing loss    left ear, no hearing aids   Hyperlipidemia    Hypertension    Leukoplakia    Microscopic hematuria    negative work up with Urology in the past.    OAB (overactive bladder)    with past percutaneous tibial nerve stimulation therapy   Thrombocytopenia (HCC)    Tinnitus    Tobacco abuse    Vitamin B 12 deficiency    Vitamin D deficiency     Past Surgical History:  Procedure Laterality Date   CATARACT EXTRACTION  2007   right eye   CATARACT EXTRACTION Left 03/21/15   COLONOSCOPY  02/28/2014   HERNIA REPAIR  1980   KNEE SURGERY  02-28-2008   replaced inside right knee   Left Knee Repair  11-10-2006   NOSE SURGERY     POLYPECTOMY     colon polyps   SKIN BIOPSY  10-08-2006   left ear    Social History   Socioeconomic History   Marital status: Single    Spouse name: Not on file   Number of children: 0   Years of education: 10   Highest education  level: 10th grade  Occupational History   Occupation: Retired    Comment: retired Chartered loss adjuster  Tobacco Use   Smoking status: Former    Current packs/day: 0.00    Average packs/day: 1 pack/day for 50.0 years (50.0 ttl pk-yrs)    Types: Cigarettes    Start date: 08/15/1959    Quit date: 08/14/2009    Years since quitting: 14.0   Smokeless tobacco: Never  Vaping Use   Vaping status: Never Used  Substance and Sexual Activity   Alcohol use: No   Drug use: No   Sexual activity: Not Currently  Other Topics Concern   Not on file  Social History Narrative   Retired Chartered loss adjuster   Lives at home with Brother, [name]   Social Drivers of Health   Financial Resource Strain: Low Risk   (10/31/2022)   Overall Financial Resource Strain (CARDIA)    Difficulty of Paying Living Expenses: Not hard at all  Food Insecurity: No Food Insecurity (10/31/2022)   Hunger Vital Sign    Worried About Running Out of Food in the Last Year: Never true    Ran Out of Food in the Last Year: Never true  Transportation Needs: No Transportation Needs (10/31/2022)   PRAPARE - Administrator, Civil Service (Medical): No    Lack of Transportation (Non-Medical): No  Physical Activity: Insufficiently Active (10/31/2022)   Exercise Vital Sign    Days of Exercise per Week: 3 days    Minutes of Exercise per Session: 30 min  Stress: No Stress Concern Present (10/31/2022)   [employer] of Occupational Health - Occupational Stress Questionnaire    Feeling of Stress : Not at all  Social Connections: Moderately Isolated (10/31/2022)   Social Connection and Isolation Panel [NHANES]    Frequency of Communication with Friends and Family: More than three times a week    Frequency of Social Gatherings with Friends and Family: More than three times a week    Attends Religious Services: More than 4 times per year    Active Member of [employer] or Organizations: No    Attends Banker Meetings: Never    Marital Status: Never married  Intimate Partner Violence: Not At Risk (10/31/2022)   Humiliation, Afraid, Rape, and Kick questionnaire    Fear of Current or Ex-Partner: No    Emotionally Abused: No    Physically Abused: No    Sexually Abused: No    Outpatient Encounter Medications as of 08/18/2023  Medication Sig   acetaminophen (TYLENOL) 500 MG tablet Take 1 tablet (500 mg total) by mouth every 6 (six) hours as needed.   azithromycin (ZITHROMAX) 500 MG tablet Take 1 tablet (500 mg total) by mouth daily for 3 days.   benzonatate (TESSALON PERLES) 100 MG capsule Take 1 capsule (100 mg total) by mouth 3 (three) times daily as needed for cough.   Calcium Carb-Cholecalciferol (CALCIUM 600 + D  PO) Take by mouth 2 (two) times daily.    EPINEPHrine 0.3 mg/0.3 mL IJ SOAJ injection Inject 0.3 mg into the muscle as needed for anaphylaxis.   ezetimibe (ZETIA) 10 MG tablet Take 1 tablet (10 mg total) by mouth daily.   gabapentin (NEURONTIN) 100 MG capsule Take 1 capsule (100 mg total) by mouth at bedtime.   Glucosamine-Chondroit-Vit C-Mn (GLUCOSAMINE 1500 COMPLEX PO) Take by mouth 2 (two) times daily.    glucose blood (ONETOUCH ULTRA) test strip Test BS daily Dx E11.9   hydrOXYzine (VISTARIL)  25 MG capsule Take 1 capsule (25 mg total) by mouth 3 (three) times daily as needed.   Multiple Vitamins-Minerals (MULTIVITAMIN WITH MINERALS) tablet Take 1 tablet by mouth daily.   mupirocin ointment (BACTROBAN) 2 % SMARTSIG:sparingly Topical Daily   polyethylene glycol powder (MIRALAX) 17 GM/SCOOP powder Take 17 g by mouth as needed for moderate constipation.   ramipril (ALTACE) 10 MG capsule Take 1 capsule (10 mg total) by mouth daily.   simvastatin (ZOCOR) 40 MG tablet Take 1 tablet (40 mg total) by mouth at bedtime.   tamsulosin (FLOMAX) 0.4 MG CAPS capsule Take 2 capsules (0.8 mg total) by mouth at bedtime.   Vibegron (GEMTESA) 75 MG TABS Take 1 tablet by mouth as needed (urinary leakage).   Facility-Administered Encounter Medications as of 08/18/2023  Medication   cyanocobalamin ((VITAMIN B-12)) injection 1,000 mcg    Allergies  Allergen Reactions   Clavulanic Acid    Diclofenac Swelling   Penicillins Other (See Comments)    unknown   Aspirin Other (See Comments)    Causes Blood in urine   Crestor [Rosuvastatin Calcium] Other (See Comments)    Myalgia, "joint problem"   Gabapentin Other (See Comments)    Blurry vision Pt able to take in small doses    Hydrocodone Other (See Comments)    Constipation     Lyrica [Pregabalin]     Blurry vision     Pertinent ROS per HPI, otherwise unremarkable      Objective:  BP (!) 146/74   Pulse 82   Temp 97.7 F (36.5 C)   Ht 5\' 6"   (1.676 m)   Wt 233 lb 6.4 oz (105.9 kg)   SpO2 90%   BMI 37.67 kg/m    Wt Readings from Last 3 Encounters:  08/18/23 233 lb 6.4 oz (105.9 kg)  07/22/23 236 lb (107 kg)  06/19/23 233 lb 3.2 oz (105.8 kg)    Physical Exam Vitals and nursing note reviewed.  Constitutional:      General: He is not in acute distress.    Appearance: Normal appearance. He is morbidly obese. He is not ill-appearing, toxic-appearing or diaphoretic.  HENT:     Head: Normocephalic and atraumatic.     Nose: Congestion present.     Mouth/Throat:     Mouth: Mucous membranes are moist.     Pharynx: Oropharynx is clear. No oropharyngeal exudate or posterior oropharyngeal erythema.  Eyes:     Conjunctiva/sclera: Conjunctivae normal.     Pupils: Pupils are equal, round, and reactive to light.  Cardiovascular:     Rate and Rhythm: Normal rate and regular rhythm.     Heart sounds: Normal heart sounds.  Pulmonary:     Effort: Pulmonary effort is normal.     Breath sounds: Normal breath sounds.     Comments: Congested cough Musculoskeletal:     Cervical back: Neck supple.     Right lower leg: No edema.     Left lower leg: No edema.  Lymphadenopathy:     Cervical: No cervical adenopathy.  Skin:    General: Skin is warm and dry.     Capillary Refill: Capillary refill takes less than 2 seconds.  Neurological:     General: No focal deficit present.     Mental Status: He is alert and oriented to person, place, and time.  Psychiatric:        Mood and Affect: Mood normal.        Behavior: Behavior normal. Behavior is  cooperative.        Thought Content: Thought content normal.        Judgment: Judgment normal.      Results for orders placed or performed in visit on 06/19/23  COVID-19, Flu A+B and RSV   Collection Time: 06/19/23  2:58 PM   Specimen: Nasopharyngeal(NP) swabs in vial transport medium  Result Value Ref Range   SARS-CoV-2, NAA Detected (A) Not Detected   Influenza A, NAA Not Detected Not  Detected   Influenza B, NAA Not Detected Not Detected   RSV, NAA Not Detected Not Detected   Test Information: Comment        Pertinent labs & imaging results that were available during my care of the patient were reviewed by me and considered in my medical decision making.  Assessment & Plan:  Lonnie Hurley was seen today for cough.  Diagnoses and all orders for this visit:  Cough in adult -     DG Chest 2 View; Future -     benzonatate (TESSALON PERLES) 100 MG capsule; Take 1 capsule (100 mg total) by mouth 3 (three) times daily as needed for cough. -     azithromycin (ZITHROMAX) 500 MG tablet; Take 1 tablet (500 mg total) by mouth daily for 3 days.  URI with cough and congestion -     DG Chest 2 View; Future -     benzonatate (TESSALON PERLES) 100 MG capsule; Take 1 capsule (100 mg total) by mouth 3 (three) times daily as needed for cough. -     azithromycin (ZITHROMAX) 500 MG tablet; Take 1 tablet (500 mg total) by mouth daily for 3 days.     Assessment and Plan    Chronic Cough Persistent cough since January with episodes of yellow or white sputum, no fever, chills, or confusion. Cough persists post-doxycycline. Clear lung auscultation suggests no acute respiratory distress. Differential includes post-infectious cough, chronic bronchitis, or non-bacterial cause such as mycoplasma infection. No current need for prednisone due to absence of wheezing or acute respiratory distress. - Order chest x-ray to rule out pneumonia or other underlying conditions. - Prescribe Tessalon for symptomatic relief of cough. - Prescribe azithromycin to cover potential mycoplasma infection.          Continue all other maintenance medications.  Follow up plan: Return if symptoms worsen or fail to improve.   Continue healthy lifestyle choices, including diet (rich in fruits, vegetables, and lean proteins, and low in salt and simple carbohydrates) and exercise (at least 30 minutes of moderate  physical activity daily).  Educational handout given for URI  The above assessment and management plan was discussed with the patient. The patient verbalized understanding of and has agreed to the management plan. Patient is aware to call the clinic if they develop any new symptoms or if symptoms persist or worsen. Patient is aware when to return to the clinic for a follow-up visit. Patient educated on when it is appropriate to go to the emergency department.   Kari Baars, FNP-C [facility] [phone]

## 2023-08-28 ENCOUNTER — Telehealth: Payer: Self-pay | Admitting: Family

## 2023-08-28 ENCOUNTER — Other Ambulatory Visit: Payer: Self-pay

## 2023-08-28 DIAGNOSIS — R059 Cough, unspecified: Secondary | ICD-10-CM

## 2023-08-28 DIAGNOSIS — J069 Acute upper respiratory infection, unspecified: Secondary | ICD-10-CM

## 2023-08-28 MED ORDER — BENZONATATE 100 MG PO CAPS: 100.0000 mg | ORAL_CAPSULE | Freq: Three times a day (TID) | ORAL | 0 refills | Status: DC | PRN

## 2023-08-28 NOTE — Telephone Encounter (Signed)
  Prescription Request  08/28/2023  Is this a "Controlled Substance" medicine? no  Have you seen your PCP in the last 2 weeks? yes  If YES, route message to pool  -  If NO, patient needs to be scheduled for appointment.  What is the name of the medication or equipment? benzonatate  Have you contacted your pharmacy to request a refill? Yes   Which pharmacy would you [facility] this sent to? [facility]   Patient notified that their request is being sent to the clinical staff for review and that they should receive a response within 2 business days.

## 2023-08-28 NOTE — Progress Notes (Signed)
 RX sent to pharmacy. Tried to call patient to make him aware, no answer and no voicemail. [facility] 08/28/23

## 2023-08-28 NOTE — Telephone Encounter (Signed)
 Pt seen you 08/18/23 for acute cough. Ok for refill or does ntbs?

## 2023-08-31 NOTE — Telephone Encounter (Signed)
 Patient aware and verbalizes understanding.

## 2023-09-01 DIAGNOSIS — H90A22 Sensorineural hearing loss, unilateral, left ear, with restricted hearing on the contralateral side: Secondary | ICD-10-CM | POA: Diagnosis not present

## 2023-09-01 DIAGNOSIS — H903 Sensorineural hearing loss, bilateral: Secondary | ICD-10-CM | POA: Diagnosis not present

## 2023-09-03 ENCOUNTER — Ambulatory Visit (INDEPENDENT_AMBULATORY_CARE_PROVIDER_SITE_OTHER): Payer: PPO | Admitting: *Deleted

## 2023-09-03 DIAGNOSIS — E538 Deficiency of other specified B group vitamins: Secondary | ICD-10-CM

## 2023-09-03 NOTE — Progress Notes (Signed)
 Patient is in office today for a nurse visit for B12 Injection. Patient Injection was given in the  Left deltoid. Patient tolerated injection well.

## 2023-09-09 ENCOUNTER — Encounter: Payer: Self-pay | Admitting: Physical Medicine & Rehabilitation

## 2023-09-09 ENCOUNTER — Encounter: Payer: PPO | Attending: Physical Medicine and Rehabilitation | Admitting: Physical Medicine & Rehabilitation

## 2023-09-09 VITALS — BP 153/77 | HR 72 | Ht 66.0 in | Wt 234.0 lb

## 2023-09-09 DIAGNOSIS — M170 Bilateral primary osteoarthritis of knee: Secondary | ICD-10-CM | POA: Insufficient documentation

## 2023-09-09 MED ORDER — TRIAMCINOLONE ACETONIDE 32 MG IX SRER
32.0000 mg | Freq: Once | INTRA_ARTICULAR | Status: AC
  Administered 2023-09-09: 32 mg via INTRA_ARTICULAR

## 2023-09-09 NOTE — Patient Instructions (Signed)
 ALWAYS FEEL FREE TO CALL OUR OFFICE WITH ANY PROBLEMS OR QUESTIONS [phone])  **PLEASE NOTE** ALL MEDICATION REFILL REQUESTS (INCLUDING CONTROLLED SUBSTANCES) NEED TO BE MADE AT LEAST 7 DAYS PRIOR TO REFILL BEING DUE. ANY REFILL REQUESTS INSIDE THAT TIME FRAME MAY RESULT IN DELAYS IN RECEIVING YOUR PRESCRIPTION.

## 2023-09-09 NOTE — Progress Notes (Signed)
 PROCEDURE NOTE  DIAGNOSIS:  OA RIGHT KNEE  INTERVENTION:   ZILRETTA INJECTION     After informed consent and preparation of the skin with betadine and isopropyl alcohol, I injected 32MG  of zilretta dissolved into 5cc of diluent into the right knee via anterolateral approach. Contents of syring were shaken vigorously and aspiration was performed prior to injection. The patient tolerated well, and no complications were encountered. Afterward the area was cleaned and dressed. Post- injection instructions were provided including ice  if swelling or pain should occur.    Ranelle Oyster, MD, [facility] 09/09/2023

## 2023-10-05 ENCOUNTER — Ambulatory Visit (INDEPENDENT_AMBULATORY_CARE_PROVIDER_SITE_OTHER)

## 2023-10-05 DIAGNOSIS — E538 Deficiency of other specified B group vitamins: Secondary | ICD-10-CM | POA: Diagnosis not present

## 2023-10-05 NOTE — Progress Notes (Signed)
 Patient is in office today for a nurse visit for B12 Injection. Patient Injection was given in the  Right deltoid. Patient tolerated injection well.

## 2023-10-08 ENCOUNTER — Other Ambulatory Visit: Payer: Self-pay | Admitting: Family

## 2023-10-08 ENCOUNTER — Ambulatory Visit (INDEPENDENT_AMBULATORY_CARE_PROVIDER_SITE_OTHER): Payer: PPO | Admitting: Family

## 2023-10-08 ENCOUNTER — Ambulatory Visit

## 2023-10-08 ENCOUNTER — Encounter: Payer: Self-pay | Admitting: Family

## 2023-10-08 ENCOUNTER — Ambulatory Visit (INDEPENDENT_AMBULATORY_CARE_PROVIDER_SITE_OTHER)

## 2023-10-08 VITALS — BP 139/79 | HR 83 | Temp 97.8°F | Ht 66.0 in | Wt 233.2 lb

## 2023-10-08 DIAGNOSIS — R051 Acute cough: Secondary | ICD-10-CM

## 2023-10-08 DIAGNOSIS — M79604 Pain in right leg: Secondary | ICD-10-CM | POA: Diagnosis not present

## 2023-10-08 DIAGNOSIS — Z0000 Encounter for general adult medical examination without abnormal findings: Secondary | ICD-10-CM

## 2023-10-08 DIAGNOSIS — E785 Hyperlipidemia, unspecified: Secondary | ICD-10-CM | POA: Diagnosis not present

## 2023-10-08 DIAGNOSIS — L509 Urticaria, unspecified: Secondary | ICD-10-CM

## 2023-10-08 DIAGNOSIS — E1169 Type 2 diabetes mellitus with other specified complication: Secondary | ICD-10-CM

## 2023-10-08 DIAGNOSIS — Z91018 Allergy to other foods: Secondary | ICD-10-CM

## 2023-10-08 DIAGNOSIS — M79605 Pain in left leg: Secondary | ICD-10-CM | POA: Diagnosis not present

## 2023-10-08 DIAGNOSIS — I700 Atherosclerosis of aorta: Secondary | ICD-10-CM | POA: Diagnosis not present

## 2023-10-08 DIAGNOSIS — I10 Essential (primary) hypertension: Secondary | ICD-10-CM

## 2023-10-08 DIAGNOSIS — Z0001 Encounter for general adult medical examination with abnormal findings: Secondary | ICD-10-CM

## 2023-10-08 DIAGNOSIS — M170 Bilateral primary osteoarthritis of knee: Secondary | ICD-10-CM | POA: Diagnosis not present

## 2023-10-08 DIAGNOSIS — J984 Other disorders of lung: Secondary | ICD-10-CM

## 2023-10-08 DIAGNOSIS — R350 Frequency of micturition: Secondary | ICD-10-CM | POA: Diagnosis not present

## 2023-10-08 DIAGNOSIS — R059 Cough, unspecified: Secondary | ICD-10-CM | POA: Diagnosis not present

## 2023-10-08 DIAGNOSIS — N401 Enlarged prostate with lower urinary tract symptoms: Secondary | ICD-10-CM | POA: Diagnosis not present

## 2023-10-08 DIAGNOSIS — R918 Other nonspecific abnormal finding of lung field: Secondary | ICD-10-CM | POA: Diagnosis not present

## 2023-10-08 LAB — BAYER DCA HB A1C WAIVED: HB A1C (BAYER DCA - WAIVED): 5.9 % — ABNORMAL HIGH (ref 4.8–5.6)

## 2023-10-08 LAB — LIPID PANEL

## 2023-10-08 MED ORDER — PREDNISONE 10 MG (21) PO TBPK: ORAL_TABLET | ORAL | 0 refills | Status: DC

## 2023-10-08 MED ORDER — GABAPENTIN 100 MG PO CAPS
100.0000 mg | ORAL_CAPSULE | Freq: Every day | ORAL | 1 refills | Status: DC
Start: 2023-10-08 — End: 2024-01-04

## 2023-10-08 MED ORDER — HYDROXYZINE PAMOATE 25 MG PO CAPS: 25.0000 mg | ORAL_CAPSULE | Freq: Three times a day (TID) | ORAL | 1 refills | Status: DC | PRN

## 2023-10-08 MED ORDER — SIMVASTATIN 40 MG PO TABS: 40.0000 mg | ORAL_TABLET | Freq: Every day | ORAL | 1 refills | Status: DC

## 2023-10-08 MED ORDER — EZETIMIBE 10 MG PO TABS: 10.0000 mg | ORAL_TABLET | Freq: Every day | ORAL | 1 refills | Status: DC

## 2023-10-08 MED ORDER — BENZONATATE 200 MG PO CAPS: 200.0000 mg | ORAL_CAPSULE | Freq: Two times a day (BID) | ORAL | 1 refills | Status: DC | PRN

## 2023-10-08 NOTE — Patient Instructions (Signed)
 Health Maintenance After Age 81 After age 4, you are at a higher risk for certain long-term diseases and infections as well as injuries from falls. Falls are a major cause of broken bones and head injuries in people who are older than age 47. Getting regular preventive care can help to keep you healthy and well. Preventive care includes getting regular testing and making lifestyle changes as recommended by your health care provider. Talk with your health care provider about: Which screenings and tests you should have. A screening is a test that checks for a disease when you have no symptoms. A diet and exercise plan that is right for you. What should I know about screenings and tests to prevent falls? Screening and testing are the best ways to find a health problem early. Early diagnosis and treatment give you the best chance of managing medical conditions that are common after age 37. Certain conditions and lifestyle choices may make you more likely to have a fall. Your health care provider may recommend: Regular vision checks. Poor vision and conditions such as cataracts can make you more likely to have a fall. If you wear glasses, make sure to get your prescription updated if your vision changes. Medicine review. Work with your health care provider to regularly review all of the medicines you are taking, including over-the-counter medicines. Ask your health care provider about any side effects that may make you more likely to have a fall. Tell your health care provider if any medicines that you take make you feel dizzy or sleepy. Strength and balance checks. Your health care provider may recommend certain tests to check your strength and balance while standing, walking, or changing positions. Foot health exam. Foot pain and numbness, as well as not wearing proper footwear, can make you more likely to have a fall. Screenings, including: Osteoporosis screening. Osteoporosis is a condition that causes  the bones to get weaker and break more easily. Blood pressure screening. Blood pressure changes and medicines to control blood pressure can make you feel dizzy. Depression screening. You may be more likely to have a fall if you have a fear of falling, feel depressed, or feel unable to do activities that you used to do. Alcohol use screening. Using too much alcohol can affect your balance and may make you more likely to have a fall. Follow these instructions at home: Lifestyle Do not drink alcohol if: Your health care provider tells you not to drink. If you drink alcohol: Limit how much you have to: 0-1 drink a day for women. 0-2 drinks a day for men. Know how much alcohol is in your drink. In the [place] one drink equals one 12 oz bottle of beer (355 mL), one 5 oz glass of wine (148 mL), or one 1 oz glass of hard liquor (44 mL). Do not use any products that contain nicotine or tobacco. These products include cigarettes, chewing tobacco, and vaping devices, such as e-cigarettes. If you need help quitting, ask your health care provider. Activity  Follow a regular exercise program to stay fit. This will help you maintain your balance. Ask your health care provider what types of exercise are appropriate for you. If you need a cane or walker, use it as recommended by your health care provider. Wear supportive shoes that have nonskid soles. Safety  Remove any tripping hazards, such as rugs, cords, and clutter. Install safety equipment such as grab bars in bathrooms and safety rails on stairs. Keep rooms and walkways  well-lit. General instructions Talk with your health care provider about your risks for falling. Tell your health care provider if: You fall. Be sure to tell your health care provider about all falls, even ones that seem minor. You feel dizzy, tiredness (fatigue), or off-balance. Take over-the-counter and prescription medicines only as told by your health care provider. These include  supplements. Eat a healthy diet and maintain a healthy weight. A healthy diet includes low-fat dairy products, low-fat (lean) meats, and fiber from whole grains, beans, and lots of fruits and vegetables. Stay current with your vaccines. Schedule regular health, dental, and eye exams. Summary Having a healthy lifestyle and getting preventive care can help to protect your health and wellness after age 11. Screening and testing are the best way to find a health problem early and help you avoid having a fall. Early diagnosis and treatment give you the best chance for managing medical conditions that are more common for people who are older than age 28. Falls are a major cause of broken bones and head injuries in people who are older than age 48. Take precautions to prevent a fall at home. Work with your health care provider to learn what changes you can make to improve your health and wellness and to prevent falls. This information is not intended to replace advice given to you by your health care provider. Make sure you discuss any questions you have with your health care provider. Document Revised: 10/22/2020 Document Reviewed: 10/22/2020 Elsevier Patient Education  2024 [employer].

## 2023-10-08 NOTE — Progress Notes (Signed)
 Subjective:    Patient ID: Lonnie Hurley, male    DOB: 09/09/1940, 83 y.o.   MRN: [id number]  Chief Complaint  Patient presents with   Anticoagulation   Pt presents to the office today for CPE and chronic follow up.  He is followed by Urologists every 6 months for BPH.   Followed by Cardiologists annually.    He is followed by Dermatologists every 6 months.   He is followed by Ortho for osteoarthritis for bilateral knee pain and getting steroid injections.    He has aortic atherosclerosis and takes Zetia  and Zocor . Hypertension This is a chronic problem. The current episode started more than 1 year ago. The problem has been resolved since onset. The problem is uncontrolled. Associated symptoms include malaise/fatigue. Pertinent negatives include no blurred vision, headaches, peripheral edema or shortness of breath. Risk factors for coronary artery disease include dyslipidemia, diabetes mellitus, obesity and male gender. The current treatment provides moderate improvement.  Diabetes He presents for his follow-up diabetic visit. He has type 2 diabetes mellitus. Pertinent negatives for hypoglycemia include no headaches. Associated symptoms include foot paresthesias. Pertinent negatives for diabetes include no blurred vision. Symptoms are stable. Diabetic complications include peripheral neuropathy. Risk factors for coronary artery disease include dyslipidemia, diabetes mellitus, hypertension, male sex and sedentary lifestyle. He is following a generally unhealthy diet. His overall blood glucose range is 110-130 mg/dl.  Arthritis Presents for follow-up visit. He complains of pain and stiffness. Affected locations include the left knee, right knee, left MCP and right MCP. His pain is at a severity of 8/10.  Benign Prostatic Hypertrophy This is a chronic problem. The current episode started more than 1 year ago. Irritative symptoms include nocturia (2). Pertinent negatives include no chills.  Past treatments include tamsulosin  (vibegron). The treatment provided moderate relief.  Cough This is a recurrent problem. The current episode started 1 to 4 weeks ago. The problem has been waxing and waning. The problem occurs every few minutes. The cough is Productive of sputum. Associated symptoms include nasal congestion. Pertinent negatives include no chills, ear congestion, ear pain, headaches or shortness of breath. He has tried rest and OTC cough suppressant (zpak) for the symptoms. The treatment provided mild relief.      Review of Systems  Constitutional:  Positive for malaise/fatigue. Negative for chills.  HENT:  Negative for ear pain.   Eyes:  Negative for blurred vision.  Respiratory:  Positive for cough. Negative for shortness of breath.   Genitourinary:  Positive for nocturia (2).  Musculoskeletal:  Positive for stiffness.  Neurological:  Negative for headaches.  All other systems reviewed and are negative.      Objective:   Physical Exam Vitals reviewed.  Constitutional:      General: He is not in acute distress.    Appearance: He is well-developed. He is obese.  HENT:     Head: Normocephalic.     Right Ear: Tympanic membrane normal.     Left Ear: Tympanic membrane normal.  Eyes:     General:        Right eye: No discharge.        Left eye: No discharge.     Pupils: Pupils are equal, round, and reactive to light.  Neck:     Thyroid : No thyromegaly.  Cardiovascular:     Rate and Rhythm: Normal rate and regular rhythm.     Heart sounds: Normal heart sounds. No murmur heard. Pulmonary:     Effort: Pulmonary effort  is normal. No respiratory distress.     Breath sounds: Normal breath sounds. No wheezing.     Comments: Dry nonproductive cough Abdominal:     General: Bowel sounds are normal. There is no distension.     Palpations: Abdomen is soft.     Tenderness: There is no abdominal tenderness.  Musculoskeletal:        General: No tenderness. Normal range of  motion.     Cervical back: Normal range of motion and neck supple.     Right lower leg: Edema (trace) present.     Left lower leg: Edema (trace) present.  Skin:    General: Skin is warm and dry.     Findings: No erythema or rash.  Neurological:     Mental Status: He is alert and oriented to person, place, and time.     Cranial Nerves: No cranial nerve deficit.     Deep Tendon Reflexes: Reflexes are normal and symmetric.  Psychiatric:        Behavior: Behavior normal.        Thought Content: Thought content normal.        Judgment: Judgment normal.     BP 139/79   Pulse 83   Temp 97.8 F (36.6 C) (Temporal)   Ht 5\' 6"  (1.676 m)   Wt 233 lb 3.2 oz (105.8 kg)   SpO2 93%   BMI 37.64 kg/m      Assessment & Plan:   Emre Stock comes in today with chief complaint of Anticoagulation   Diagnosis and orders addressed:  1. Hyperlipidemia, unspecified hyperlipidemia type - ezetimibe  (ZETIA ) 10 MG tablet; Take 1 tablet (10 mg total) by mouth daily.  Dispense: 90 tablet; Refill: 1 - simvastatin  (ZOCOR ) 40 MG tablet; Take 1 tablet (40 mg total) by mouth at bedtime.  Dispense: 90 tablet; Refill: 1 - CMP14+EGFR - CBC with Differential/Platelet - Lipid panel  2. Pain in both lower extremities - gabapentin  (NEURONTIN ) 100 MG capsule; Take 1 capsule (100 mg total) by mouth at bedtime.  Dispense: 90 capsule; Refill: 1 - CMP14+EGFR - CBC with Differential/Platelet  3. Allergy to alpha-gal - hydrOXYzine  (VISTARIL ) 25 MG capsule; Take 1 capsule (25 mg total) by mouth 3 (three) times daily as needed.  Dispense: 90 capsule; Refill: 1 - CMP14+EGFR - CBC with Differential/Platelet  4. Hives - hydrOXYzine  (VISTARIL ) 25 MG capsule; Take 1 capsule (25 mg total) by mouth 3 (three) times daily as needed.  Dispense: 90 capsule; Refill: 1 - CMP14+EGFR - CBC with Differential/Platelet  5. Annual physical exam (Primary) - CMP14+EGFR - Bayer DCA Hb A1c Waived - CBC with  Differential/Platelet - Lipid panel - TSH - Vitamin B12  6. Primary hypertension - CMP14+EGFR - CBC with Differential/Platelet - TSH  7. Benign prostatic hyperplasia with urinary frequency - CMP14+EGFR - CBC with Differential/Platelet  8. Type 2 diabetes mellitus with other specified complication, without long-term current use of insulin (HCC) - CMP14+EGFR - Bayer DCA Hb A1c Waived - CBC with Differential/Platelet - TSH - Vitamin B12  9. Aortic atherosclerosis (HCC) - CMP14+EGFR - CBC with Differential/Platelet  10. Primary osteoarthritis of both knees - CMP14+EGFR - CBC with Differential/Platelet  11. Acute cough - DG Chest 2 View; Future    Labs pending Keep follow up with specialists  Continue all medications  Chest x-ray pending, tessalon  increased to 200 mg from 100 mg Health Maintenance reviewed Diet and exercise encouraged  Follow up plan: 4 months    Tommas Fragmin, FNP

## 2023-10-09 LAB — CMP14+EGFR
ALT: 18 IU/L (ref 0–44)
AST: 20 IU/L (ref 0–40)
Albumin: 4 g/dL (ref 3.8–4.8)
Alkaline Phosphatase: 66 IU/L (ref 44–121)
BUN/Creatinine Ratio: 14 (ref 10–24)
BUN: 12 mg/dL (ref 8–27)
Bilirubin Total: 0.5 mg/dL (ref 0.0–1.2)
CO2: 24 mmol/L (ref 20–29)
Calcium: 9.1 mg/dL (ref 8.6–10.2)
Chloride: 105 mmol/L (ref 96–106)
Creatinine, Ser: 0.86 mg/dL (ref 0.76–1.27)
Globulin, Total: 2.3 g/dL (ref 1.5–4.5)
Glucose: 129 mg/dL — ABNORMAL HIGH (ref 70–99)
Potassium: 3.9 mmol/L (ref 3.5–5.2)
Sodium: 143 mmol/L (ref 134–144)
Total Protein: 6.3 g/dL (ref 6.0–8.5)
eGFR: 88 mL/min/{1.73_m2} (ref >59)

## 2023-10-09 LAB — CBC WITH DIFFERENTIAL/PLATELET
Basophils Absolute: 0.1 10*3/uL (ref 0.0–0.2)
Basos: 1 %
EOS (ABSOLUTE): 0.2 10*3/uL (ref 0.0–0.4)
Eos: 2 %
Hematocrit: 45.7 % (ref 37.5–51.0)
Hemoglobin: 15.1 g/dL (ref 13.0–17.7)
Immature Grans (Abs): 0 10*3/uL (ref 0.0–0.1)
Immature Granulocytes: 0 %
Lymphocytes Absolute: 2.2 10*3/uL (ref 0.7–3.1)
Lymphs: 26 %
MCH: 30 pg (ref 26.6–33.0)
MCHC: 33 g/dL (ref 31.5–35.7)
MCV: 91 fL (ref 79–97)
Monocytes Absolute: 0.8 10*3/uL (ref 0.1–0.9)
Monocytes: 10 %
Neutrophils Absolute: 5.3 10*3/uL (ref 1.4–7.0)
Neutrophils: 61 %
Platelets: 128 10*3/uL — ABNORMAL LOW (ref 150–450)
RBC: 5.03 x10E6/uL (ref 4.14–5.80)
RDW: 13.1 % (ref 11.6–15.4)
WBC: 8.7 10*3/uL (ref 3.4–10.8)

## 2023-10-09 LAB — LIPID PANEL
Cholesterol, Total: 113 mg/dL (ref 100–199)
HDL: 36 mg/dL — ABNORMAL LOW (ref >39)
LDL CALC COMMENT:: 3.1 ratio (ref 0.0–5.0)
LDL Chol Calc (NIH): 48 mg/dL (ref 0–99)
Triglycerides: 173 mg/dL — ABNORMAL HIGH (ref 0–149)
VLDL Cholesterol Cal: 29 mg/dL (ref 5–40)

## 2023-10-09 LAB — VITAMIN B12: Vitamin B-12: 915 pg/mL (ref 232–1245)

## 2023-10-09 LAB — TSH: TSH: 1.86 u[IU]/mL (ref 0.450–4.500)

## 2023-10-15 ENCOUNTER — Telehealth: Payer: Self-pay

## 2023-10-15 DIAGNOSIS — H903 Sensorineural hearing loss, bilateral: Secondary | ICD-10-CM | POA: Diagnosis not present

## 2023-10-15 NOTE — Telephone Encounter (Signed)
 Copied from CRM [id number]. Topic: Clinical - Lab/Test Results >> Oct 15, 2023  8:17 AM Retta Caster wrote: Reason for CRM: Patient returning call from office. No notes but there are Labs and DG Chest 2 View (Accession [id number]) (Order [id number]) re3sults from 04/24. Needs call back on this and why the office called. [phone]

## 2023-10-20 ENCOUNTER — Encounter: Payer: Self-pay | Admitting: *Deleted

## 2023-10-27 DIAGNOSIS — H903 Sensorineural hearing loss, bilateral: Secondary | ICD-10-CM | POA: Diagnosis not present

## 2023-10-30 ENCOUNTER — Other Ambulatory Visit: Payer: Self-pay | Admitting: Family

## 2023-10-30 DIAGNOSIS — Z91018 Allergy to other foods: Secondary | ICD-10-CM

## 2023-10-30 DIAGNOSIS — L509 Urticaria, unspecified: Secondary | ICD-10-CM

## 2023-11-02 DIAGNOSIS — D1801 Hemangioma of skin and subcutaneous tissue: Secondary | ICD-10-CM | POA: Diagnosis not present

## 2023-11-02 DIAGNOSIS — L720 Epidermal cyst: Secondary | ICD-10-CM | POA: Diagnosis not present

## 2023-11-02 DIAGNOSIS — Z85828 Personal history of other malignant neoplasm of skin: Secondary | ICD-10-CM | POA: Diagnosis not present

## 2023-11-02 DIAGNOSIS — L570 Actinic keratosis: Secondary | ICD-10-CM | POA: Diagnosis not present

## 2023-11-02 DIAGNOSIS — L821 Other seborrheic keratosis: Secondary | ICD-10-CM | POA: Diagnosis not present

## 2023-11-02 DIAGNOSIS — L812 Freckles: Secondary | ICD-10-CM | POA: Diagnosis not present

## 2023-11-05 ENCOUNTER — Ambulatory Visit (INDEPENDENT_AMBULATORY_CARE_PROVIDER_SITE_OTHER)

## 2023-11-05 DIAGNOSIS — E538 Deficiency of other specified B group vitamins: Secondary | ICD-10-CM

## 2023-11-05 NOTE — Progress Notes (Signed)
 Patient is in office today for a nurse visit for B12 Injection. Patient Injection was given in the  Left deltoid. Patient tolerated injection well.

## 2023-11-16 ENCOUNTER — Ambulatory Visit: Admitting: Family

## 2023-11-16 ENCOUNTER — Encounter: Payer: Self-pay | Admitting: Family

## 2023-11-16 VITALS — BP 118/80 | HR 67 | Temp 97.0°F | Ht 66.0 in | Wt 231.4 lb

## 2023-11-16 DIAGNOSIS — H6123 Impacted cerumen, bilateral: Secondary | ICD-10-CM | POA: Diagnosis not present

## 2023-11-16 NOTE — Progress Notes (Signed)
 Subjective:    Patient ID: Lonnie Hurley, male    DOB: 1941-03-22, 83 y.o.   MRN: [id number]  Chief Complaint  Patient presents with   Cerumen Impaction    Both ears wants cleaned out    PT presents to the office today with complaints of bilateral ear cerumen. He has been luke warm water and has getting a lot of wax out. Requesting his ears washed out.  Ear Fullness  There is pain in both ears. This is a new problem. The current episode started 1 to 4 weeks ago. There has been no fever. The patient is experiencing no pain. The treatment provided mild relief.      Review of Systems  All other systems reviewed and are negative.   Social History   Socioeconomic History   Marital status: Single    Spouse name: Not on file   Number of children: 0   Years of education: 10   Highest education level: 10th grade  Occupational History   Occupation: Retired    Comment: retired Chartered loss adjuster  Tobacco Use   Smoking status: Former    Current packs/day: 0.00    Average packs/day: 1 pack/day for 50.0 years (50.0 ttl pk-yrs)    Types: Cigarettes    Start date: 08/15/1959    Quit date: 08/14/2009    Years since quitting: 14.2   Smokeless tobacco: Never  Vaping Use   Vaping status: Never Used  Substance and Sexual Activity   Alcohol use: No   Drug use: No   Sexual activity: Not Currently  Other Topics Concern   Not on file  Social History Narrative   Retired Chartered loss adjuster   Lives at home with Brother, Lonnie Hurley   Social Drivers of Health   Financial Resource Strain: Low Risk  (10/31/2022)   Overall Financial Resource Strain (CARDIA)    Difficulty of Paying Living Expenses: Not hard at all  Food Insecurity: No Food Insecurity (10/31/2022)   Hunger Vital Sign    Worried About Running Out of Food in the Last Year: Never true    Ran Out of Food in the Last Year: Never true  Transportation Needs: No Transportation Needs (10/31/2022)   PRAPARE - Therapist, art (Medical): No    Lack of Transportation (Non-Medical): No  Physical Activity: Insufficiently Active (10/31/2022)   Exercise Vital Sign    Days of Exercise per Week: 3 days    Minutes of Exercise per Session: 30 min  Stress: No Stress Concern Present (10/31/2022)   [employer] of Occupational Health - Occupational Stress Questionnaire    Feeling of Stress : Not at all  Social Connections: Moderately Isolated (10/31/2022)   Social Connection and Isolation Panel [NHANES]    Frequency of Communication with Friends and Family: More than three times a week    Frequency of Social Gatherings with Friends and Family: More than three times a week    Attends Religious Services: More than 4 times per year    Active Member of [employer] or Organizations: No    Attends Banker Meetings: Never    Marital Status: Never married   Family History  Problem Relation Age of Onset   Heart failure Father    Glaucoma Mother    Osteoporosis Brother    Heart disease Brother 28       stent   Hypertension Brother    Hyperlipidemia Brother    Diabetes Brother  Colon cancer Neg Hx    Stomach cancer Neg Hx    Rectal cancer Neg Hx         Objective:   Physical Exam Vitals reviewed.  Constitutional:      General: He is not in acute distress.    Appearance: He is well-developed.  HENT:     Head: Normocephalic.     Right Ear: External ear normal.     Left Ear: External ear normal.     Ears:     Comments: Mild cerumen in bilateral ears Eyes:     General:        Right eye: No discharge.        Left eye: No discharge.     Pupils: Pupils are equal, round, and reactive to light.  Neck:     Thyroid : No thyromegaly.  Cardiovascular:     Rate and Rhythm: Normal rate and regular rhythm.     Heart sounds: Normal heart sounds. No murmur heard. Pulmonary:     Effort: Pulmonary effort is normal. No respiratory distress.     Breath sounds: Normal breath sounds. No  wheezing.  Abdominal:     General: Bowel sounds are normal. There is no distension.     Palpations: Abdomen is soft.     Tenderness: There is no abdominal tenderness.  Musculoskeletal:        General: No tenderness. Normal range of motion.     Cervical back: Normal range of motion and neck supple.  Skin:    General: Skin is warm and dry.     Findings: No erythema or rash.  Neurological:     Mental Status: He is alert and oriented to person, place, and time.     Cranial Nerves: No cranial nerve deficit.     Deep Tendon Reflexes: Reflexes are normal and symmetric.  Psychiatric:        Behavior: Behavior normal.        Thought Content: Thought content normal.        Judgment: Judgment normal.      BP 118/80   Pulse 67   Temp (!) 97 F (36.1 C) (Temporal)   Ht 5\' 6"  (1.676 m)   Wt 231 lb 6.4 oz (105 kg)   SpO2 94%   BMI 37.35 kg/m      Assessment & Plan:  Lonnie Hurley comes in today with chief complaint of Cerumen Impaction (Both ears wants cleaned out )   Diagnosis and orders addressed:  1. Bilateral impacted cerumen (Primary) Washed bilateral ears  Can use debrox drops as needed Avoid sticking anything into ears  Follow if symptoms worsen or do not improve      Tommas Fragmin, FNP

## 2023-11-16 NOTE — Patient Instructions (Signed)
 Earwax Buildup, Adult Your ears make something called earwax. It helps keep germs called bacteria away and protects the skin in your ears. Sometimes, too much earwax can build up. This can cause discomfort or make it harder to hear. What are the causes? Earwax buildup can happen when you have too much earwax in your ears. Earwax is made in the outer part of your ear canal. It's supposed to fall out in small amounts over time. But if your ears aren't able to clean themselves like they should, earwax can build up. What increases the risk? You're more likely to get earwax buildup if: You clean your ears with cotton swabs. You pick at your ears. You use earplugs or in-ear headphones a lot. You wear hearing aids. You may also be more likely to get it if: You're male. You're older. Your ears naturally make more earwax. You have narrow ear canals or extra hair in your ears. Your earwax is too thick or sticky. You have eczema. You're dehydrated. This means there's not enough fluid in your body. What are the signs or symptoms? Symptoms of earwax buildup include: Not being able to hear as well. A feeling of fullness in your ear. Feeling like your ear is plugged. Fluid coming from your ear. Ear pain or an itchy ear. Ringing in your ear. Coughing or problems with balance. How is this diagnosed? Earwax buildup may be diagnosed based on your symptoms, medical history, and an ear exam. During the exam, your health care provider will look into your ear with a tool called an otoscope. You may also have tests, such as a hearing test. How is this treated? Earwax buildup may be treated by: Using ear drops. Having the earwax removed by a provider. The provider may: Flush the ear with water. Use a tool called a curette that has a loop on the end. Use a suction device. Having surgery. This may be done in severe cases. Follow these instructions at home:  Cleaning your ears Clean your ears as told  by your provider. You can clean the outside of your ears with a washcloth or tissue. Do not overclean your ears. Do not put anything into your ear unless told. This includes cotton swabs. General instructions Take over-the-counter and prescription medicines only as told by your provider. Drink enough fluid to keep your pee (urine) pale yellow. This helps thin the earwax. If you have hearing aids, clean them as told. Keep all follow-up visits. If earwax builds up in your ears often or if you use hearing aids, ask your provider how often you should have your ears cleaned. Contact a health care provider if: Your ear pain gets worse. You have a fever. You have pus, blood, or other fluid coming from your ear. You have hearing loss. You have ringing in your ears that won't go away. You feel like the room is spinning. This is called vertigo. Your symptoms don't get better with treatment. This information is not intended to replace advice given to you by your health care provider. Make sure you discuss any questions you have with your health care provider. Document Revised: 08/14/2022 Document Reviewed: 08/14/2022 Elsevier Patient Education  2024 [employer].

## 2023-12-07 ENCOUNTER — Ambulatory Visit (INDEPENDENT_AMBULATORY_CARE_PROVIDER_SITE_OTHER)

## 2023-12-07 DIAGNOSIS — E538 Deficiency of other specified B group vitamins: Secondary | ICD-10-CM | POA: Diagnosis not present

## 2023-12-07 NOTE — Progress Notes (Signed)
 Patient is in office today for a nurse visit for B12 Injection. Patient Injection was given in the  Left deltoid. Patient tolerated injection well.

## 2023-12-09 ENCOUNTER — Encounter: Payer: Self-pay | Admitting: Physical Medicine & Rehabilitation

## 2023-12-09 ENCOUNTER — Encounter: Attending: Physical Medicine and Rehabilitation | Admitting: Physical Medicine & Rehabilitation

## 2023-12-09 VITALS — BP 138/72 | HR 60 | Ht 66.0 in | Wt 229.0 lb

## 2023-12-09 DIAGNOSIS — M170 Bilateral primary osteoarthritis of knee: Secondary | ICD-10-CM | POA: Diagnosis not present

## 2023-12-09 MED ORDER — TRIAMCINOLONE ACETONIDE 32 MG IX SRER
32.0000 mg | Freq: Once | INTRA_ARTICULAR | Status: AC
  Administered 2023-12-09: 32 mg via INTRA_ARTICULAR

## 2023-12-09 NOTE — Progress Notes (Signed)
 PROCEDURE NOTE  DIAGNOSIS:    INTERVENTION:   ZILRETTA  INJECTION     After informed consent and preparation of the skin with betadine and isopropyl alcohol, I injected 32MG  of zilretta  dissolved into 5cc of diluent  into RIGHT KNEE via ANTEROLATERAL approach. Contents of syring were shaken vigorously and aspiration was performed prior to injection. The patient tolerated well, and no complications were encountered. Afterward the area was cleaned and dressed. Post- injection instructions were provided including ice  if swelling or pain should occur.    Arthea IVAR Gunther, MD, [facility] 12/09/2023     FOLLOW UP FOR 3 MONTHS FOR ? ZILRETTA  VS  CELESTONE  INJECTION

## 2023-12-09 NOTE — Patient Instructions (Signed)
 ALWAYS FEEL FREE TO CALL OUR OFFICE WITH ANY PROBLEMS OR QUESTIONS [phone])  **PLEASE NOTE** ALL MEDICATION REFILL REQUESTS (INCLUDING CONTROLLED SUBSTANCES) NEED TO BE MADE AT LEAST 7 DAYS PRIOR TO REFILL BEING DUE. ANY REFILL REQUESTS INSIDE THAT TIME FRAME MAY RESULT IN DELAYS IN RECEIVING YOUR PRESCRIPTION.

## 2024-01-04 ENCOUNTER — Other Ambulatory Visit: Payer: Self-pay | Admitting: Family

## 2024-01-04 DIAGNOSIS — E785 Hyperlipidemia, unspecified: Secondary | ICD-10-CM

## 2024-01-04 DIAGNOSIS — M79604 Pain in right leg: Secondary | ICD-10-CM

## 2024-01-04 MED ORDER — EZETIMIBE 10 MG PO TABS: 10.0000 mg | ORAL_TABLET | Freq: Every day | ORAL | 1 refills | Status: DC

## 2024-01-04 MED ORDER — GABAPENTIN 100 MG PO CAPS: 100.0000 mg | ORAL_CAPSULE | Freq: Every day | ORAL | 1 refills | Status: DC

## 2024-01-04 NOTE — Telephone Encounter (Signed)
 FYI Only or Action Required?: Action required by provider: medication refill request.  Patient was last seen in primary care on 11/16/2023 by Lavell Bari LABOR, FNP.  Called Nurse Triage reporting No chief complaint on file..  Symptoms began today.  Interventions attempted: Nothing.  Symptoms are: stable.  Triage Disposition: No disposition on file.  Patient/caregiver understands and will follow disposition?:

## 2024-01-04 NOTE — Telephone Encounter (Unsigned)
 Copied from CRM [id number]. Topic: Clinical - Medication Refill >> Jan 04, 2024  8:03 AM Suzette B wrote: Medication:  ezetimibe  (ZETIA ) 10 MG tablet gabapentin  (NEURONTIN ) 100 MG capsule   Has the patient contacted their pharmacy? No No he stated he was calling the office before calling the pharmacy   This is the patient's preferred pharmacy:  [facility] [place], [place] - [address] [place] [address] [place] [address] Phone: [phone] Fax: [phone]    Is this the correct pharmacy for this prescription? Yes If no, delete pharmacy and type the correct one.   Has the prescription been filled recently? Yes  Is the patient out of the medication? Yes  Has the patient been seen for an appointment in the last year OR does the patient have an upcoming appointment? Yes  Can we respond through MyChart? No  Agent: Please be advised that Rx refills may take up to 3 business days. We ask that you follow-up with your pharmacy.

## 2024-01-07 ENCOUNTER — Ambulatory Visit

## 2024-01-14 ENCOUNTER — Ambulatory Visit (INDEPENDENT_AMBULATORY_CARE_PROVIDER_SITE_OTHER)

## 2024-01-14 DIAGNOSIS — E538 Deficiency of other specified B group vitamins: Secondary | ICD-10-CM | POA: Diagnosis not present

## 2024-01-14 NOTE — Progress Notes (Signed)
 Patient is in office today for a nurse visit for B12 Injection. Patient Injection was given in the  Right deltoid. Patient tolerated injection well.

## 2024-01-28 ENCOUNTER — Encounter

## 2024-02-08 ENCOUNTER — Ambulatory Visit: Admitting: Family

## 2024-02-08 ENCOUNTER — Encounter: Payer: Self-pay | Admitting: Family

## 2024-02-08 VITALS — BP 139/65 | HR 62 | Temp 98.3°F | Ht 66.0 in | Wt 227.0 lb

## 2024-02-08 DIAGNOSIS — Z91018 Allergy to other foods: Secondary | ICD-10-CM | POA: Diagnosis not present

## 2024-02-08 DIAGNOSIS — L509 Urticaria, unspecified: Secondary | ICD-10-CM | POA: Diagnosis not present

## 2024-02-08 DIAGNOSIS — E1169 Type 2 diabetes mellitus with other specified complication: Secondary | ICD-10-CM | POA: Diagnosis not present

## 2024-02-08 DIAGNOSIS — M5416 Radiculopathy, lumbar region: Secondary | ICD-10-CM | POA: Diagnosis not present

## 2024-02-08 DIAGNOSIS — N401 Enlarged prostate with lower urinary tract symptoms: Secondary | ICD-10-CM | POA: Diagnosis not present

## 2024-02-08 DIAGNOSIS — M170 Bilateral primary osteoarthritis of knee: Secondary | ICD-10-CM | POA: Diagnosis not present

## 2024-02-08 DIAGNOSIS — M79604 Pain in right leg: Secondary | ICD-10-CM | POA: Diagnosis not present

## 2024-02-08 DIAGNOSIS — R350 Frequency of micturition: Secondary | ICD-10-CM | POA: Diagnosis not present

## 2024-02-08 DIAGNOSIS — E559 Vitamin D deficiency, unspecified: Secondary | ICD-10-CM | POA: Diagnosis not present

## 2024-02-08 DIAGNOSIS — E785 Hyperlipidemia, unspecified: Secondary | ICD-10-CM

## 2024-02-08 DIAGNOSIS — I10 Essential (primary) hypertension: Secondary | ICD-10-CM | POA: Diagnosis not present

## 2024-02-08 DIAGNOSIS — E538 Deficiency of other specified B group vitamins: Secondary | ICD-10-CM | POA: Diagnosis not present

## 2024-02-08 DIAGNOSIS — M79605 Pain in left leg: Secondary | ICD-10-CM | POA: Diagnosis not present

## 2024-02-08 DIAGNOSIS — I700 Atherosclerosis of aorta: Secondary | ICD-10-CM

## 2024-02-08 LAB — BAYER DCA HB A1C WAIVED: HB A1C (BAYER DCA - WAIVED): 5.5 % (ref 4.8–5.6)

## 2024-02-08 MED ORDER — SIMVASTATIN 40 MG PO TABS: 40.0000 mg | ORAL_TABLET | Freq: Every day | ORAL | 4 refills | Status: DC

## 2024-02-08 MED ORDER — HYDROXYZINE PAMOATE 25 MG PO CAPS: 25.0000 mg | ORAL_CAPSULE | Freq: Three times a day (TID) | ORAL | 2 refills | Status: AC | PRN

## 2024-02-08 MED ORDER — BENZONATATE 200 MG PO CAPS: 200.0000 mg | ORAL_CAPSULE | Freq: Two times a day (BID) | ORAL | 2 refills | Status: AC | PRN

## 2024-02-08 MED ORDER — GABAPENTIN 100 MG PO CAPS
100.0000 mg | ORAL_CAPSULE | Freq: Every day | ORAL | 4 refills | Status: AC
Start: 2024-02-08 — End: ?

## 2024-02-08 MED ORDER — EZETIMIBE 10 MG PO TABS: 10.0000 mg | ORAL_TABLET | Freq: Every day | ORAL | 4 refills | Status: AC

## 2024-02-08 MED ORDER — RAMIPRIL 10 MG PO CAPS: 10.0000 mg | ORAL_CAPSULE | Freq: Every day | ORAL | 4 refills | Status: AC

## 2024-02-08 NOTE — Progress Notes (Signed)
 Subjective:    Patient ID: Lonnie Hurley, male    DOB: [birth year], 83 y.o.   MRN: [id number]  Chief Complaint  Patient presents with   Medical Management of Chronic Issues   Pt presents to the office today for chronic follow up.  He is followed by Urologists every 6 months for BPH.   Followed by Cardiologists annually.    He is followed by Dermatologists every 6 months.   He is followed by Ortho for osteoarthritis for bilateral knee pain and getting steroid injections.    He has aortic atherosclerosis and takes Zetia  and Zocor .  He is morbid obese with a BMI of 36 with HTN and DM.  Hypertension This is a chronic problem. The current episode started more than 1 year ago. The problem has been resolved since onset. The problem is controlled. Associated symptoms include malaise/fatigue. Pertinent negatives include no blurred vision, headaches, peripheral edema or shortness of breath. Risk factors for coronary artery disease include dyslipidemia, diabetes mellitus, obesity and male gender. The current treatment provides moderate improvement.  Diabetes He presents for his follow-up diabetic visit. He has type 2 diabetes mellitus. Pertinent negatives for hypoglycemia include no headaches. Associated symptoms include foot paresthesias. Pertinent negatives for diabetes include no blurred vision. Symptoms are stable. Diabetic complications include peripheral neuropathy. Risk factors for coronary artery disease include dyslipidemia, diabetes mellitus, hypertension, male sex and sedentary lifestyle. He is following a generally unhealthy diet. His overall blood glucose range is 110-130 mg/dl.  Arthritis Presents for follow-up visit. He complains of pain and stiffness. Affected locations include the left knee, right knee, left MCP and right MCP. His pain is at a severity of 6/10.  Benign Prostatic Hypertrophy This is a chronic problem. The current episode started more than 1 year ago. Irritative  symptoms include nocturia (2). Pertinent negatives include no chills. Past treatments include tamsulosin  (vibegron). The treatment provided moderate relief.  Cough This is a chronic problem. The current episode started more than 1 year ago. The problem has been waxing and waning. The problem occurs every few minutes. The cough is Productive of sputum. Associated symptoms include nasal congestion. Pertinent negatives include no chills, ear congestion, ear pain, headaches or shortness of breath. He has tried rest and OTC cough suppressant (zpak) for the symptoms. The treatment provided mild relief.      Review of Systems  Constitutional:  Positive for malaise/fatigue. Negative for chills.  HENT:  Negative for ear pain.   Eyes:  Negative for blurred vision.  Respiratory:  Positive for cough. Negative for shortness of breath.   Genitourinary:  Positive for nocturia (2).  Musculoskeletal:  Positive for stiffness.  Neurological:  Negative for headaches.  All other systems reviewed and are negative.  Family History  Problem Relation Age of Onset   Heart failure Father    Glaucoma Mother    Osteoporosis Brother    Heart disease Brother 58       stent   Hypertension Brother    Hyperlipidemia Brother    Diabetes Brother    Colon cancer Neg Hx    Stomach cancer Neg Hx    Rectal cancer Neg Hx    Social History   Socioeconomic History   Marital status: Single    Spouse name: Not on file   Number of children: 0   Years of education: 10   Highest education level: 10th grade  Occupational History   Occupation: Retired    Comment: retired Chartered loss adjuster  Tobacco Use   Smoking status: Former    Current packs/day: 0.00    Average packs/day: 1 pack/day for 50.0 years (50.0 ttl pk-yrs)    Types: Cigarettes    Start date: 08/15/1959    Quit date: 08/14/2009    Years since quitting: 14.4   Smokeless tobacco: Never  Vaping Use   Vaping status: Never Used  Substance and Sexual  Activity   Alcohol use: No   Drug use: No   Sexual activity: Not Currently  Other Topics Concern   Not on file  Social History Narrative   Retired Chartered loss adjuster   Lives at home with Brother, Desiderio   Social Drivers of Health   Financial Resource Strain: Low Risk  (10/31/2022)   Overall Financial Resource Strain (CARDIA)    Difficulty of Paying Living Expenses: Not hard at all  Food Insecurity: No Food Insecurity (10/31/2022)   Hunger Vital Sign    Worried About Running Out of Food in the Last Year: Never true    Ran Out of Food in the Last Year: Never true  Transportation Needs: No Transportation Needs (10/31/2022)   PRAPARE - Administrator, Civil Service (Medical): No    Lack of Transportation (Non-Medical): No  Physical Activity: Insufficiently Active (10/31/2022)   Exercise Vital Sign    Days of Exercise per Week: 3 days    Minutes of Exercise per Session: 30 min  Stress: No Stress Concern Present (10/31/2022)   [employer] of Occupational Health - Occupational Stress Questionnaire    Feeling of Stress : Not at all  Social Connections: Moderately Isolated (10/31/2022)   Social Connection and Isolation Panel    Frequency of Communication with Friends and Family: More than three times a week    Frequency of Social Gatherings with Friends and Family: More than three times a week    Attends Religious Services: More than 4 times per year    Active Member of [employer] or Organizations: No    Attends Banker Meetings: Never    Marital Status: Never married       Objective:   Physical Exam Vitals reviewed.  Constitutional:      General: He is not in acute distress.    Appearance: He is well-developed. He is obese.  HENT:     Head: Normocephalic.     Right Ear: Tympanic membrane normal.     Left Ear: Tympanic membrane normal.  Eyes:     General:        Right eye: No discharge.        Left eye: No discharge.     Pupils: Pupils are equal,  round, and reactive to light.  Neck:     Thyroid : No thyromegaly.  Cardiovascular:     Rate and Rhythm: Normal rate and regular rhythm.     Heart sounds: Normal heart sounds. No murmur heard. Pulmonary:     Effort: Pulmonary effort is normal. No respiratory distress.     Breath sounds: Normal breath sounds. No wheezing.     Comments: Dry nonproductive cough Abdominal:     General: Bowel sounds are normal. There is no distension.     Palpations: Abdomen is soft.     Tenderness: There is no abdominal tenderness.  Musculoskeletal:        General: No tenderness. Normal range of motion.     Cervical back: Normal range of motion and neck supple.     Right lower leg: Edema (  trace) present.     Left lower leg: Edema (trace) present.  Skin:    General: Skin is warm and dry.     Findings: No erythema or rash.  Neurological:     Mental Status: He is alert and oriented to person, place, and time.     Cranial Nerves: No cranial nerve deficit.     Deep Tendon Reflexes: Reflexes are normal and symmetric.  Psychiatric:        Behavior: Behavior normal.        Thought Content: Thought content normal.        Judgment: Judgment normal.    Diabetic Foot Exam - Simple   Simple Foot Form Diabetic Foot exam was performed with the following findings: Yes 02/08/2024  3:16 PM  Visual Inspection No deformities, no ulcerations, no other skin breakdown bilaterally: Yes Sensation Testing Intact to touch and monofilament testing bilaterally: Yes Pulse Check Posterior Tibialis and Dorsalis pulse intact bilaterally: Yes Comments      BP 139/65   Pulse 62   Temp 98.3 F (36.8 C)   Ht 5' 6 (1.676 m)   Wt 227 lb (103 kg)   SpO2 98%   BMI 36.64 kg/m      Assessment & Plan:   Shayan Bramhall comes in today with chief complaint of Medical Management of Chronic Issues   Diagnosis and orders addressed:  1. Hyperlipidemia, unspecified hyperlipidemia type - ezetimibe  (ZETIA ) 10 MG tablet; Take 1  tablet (10 mg total) by mouth daily.  Dispense: 90 tablet; Refill: 4 - simvastatin  (ZOCOR ) 40 MG tablet; Take 1 tablet (40 mg total) by mouth at bedtime.  Dispense: 90 tablet; Refill: 4 - CMP14+EGFR - CBC with Differential/Platelet  2. Pain in both lower extremities - gabapentin  (NEURONTIN ) 100 MG capsule; Take 1 capsule (100 mg total) by mouth at bedtime.  Dispense: 90 capsule; Refill: 4 - CMP14+EGFR - CBC with Differential/Platelet  3. Essential hypertension - ramipril  (ALTACE ) 10 MG capsule; Take 1 capsule (10 mg total) by mouth daily.  Dispense: 90 capsule; Refill: 4 - CMP14+EGFR - CBC with Differential/Platelet  4. Allergy to alpha-gal - hydrOXYzine  (VISTARIL ) 25 MG capsule; Take 1 capsule (25 mg total) by mouth every 8 (eight) hours as needed.  Dispense: 270 capsule; Refill: 2 - CMP14+EGFR - CBC with Differential/Platelet  5. Hives - hydrOXYzine  (VISTARIL ) 25 MG capsule; Take 1 capsule (25 mg total) by mouth every 8 (eight) hours as needed.  Dispense: 270 capsule; Refill: 2 - CMP14+EGFR - CBC with Differential/Platelet  6. Primary hypertension - CMP14+EGFR - CBC with Differential/Platelet  7. Vitamin D  deficiency - CMP14+EGFR - CBC with Differential/Platelet  8. Benign prostatic hyperplasia with urinary frequency - CMP14+EGFR - CBC with Differential/Platelet  9. Vitamin B 12 deficiency - CMP14+EGFR - CBC with Differential/Platelet  10. Type 2 diabetes mellitus with other specified complication, without long-term current use of insulin (HCC) (Primary) - CMP14+EGFR - CBC with Differential/Platelet - Bayer DCA Hb A1c Waived - Microalbumin / creatinine urine ratio  11. Aortic atherosclerosis (HCC) - CMP14+EGFR - CBC with Differential/Platelet  12. Lumbar radiculopathy - CMP14+EGFR - CBC with Differential/Platelet  13. Primary osteoarthritis of both knees - CMP14+EGFR - CBC with Differential/Platelet  14. Obesity, morbid (HCC) - CMP14+EGFR - CBC with  Differential/Platelet    Labs pending Keep follow up with specialists  Continue all medications  Health Maintenance reviewed Diet and exercise encouraged  Follow up plan: 4 months    Bari Learn, FNP

## 2024-02-08 NOTE — Patient Instructions (Signed)
 Poor Blood Flow (Peripheral Vascular Disease): What to Know  Peripheral vascular disease (PVD) is a disease of the blood vessels. It usually affects arteries, which are the blood vessels that carry blood from the heart to the rest of the body. PVD is also called peripheral artery disease (PAD) or poor blood flow. PVD can result in less blood flowing to the arms, legs, and organs such as the stomach or kidneys. It most often affects the lower legs and feet. Without treatment, PVD often gets worse. PVD can lead to acute limb ischemia. This happens when there's a sudden stop of blood flow to an arm or leg. This is a medical emergency. What are the causes? The most common cause of PVD is a buildup of a fatty substance called plaque inside the arteries. This decreases blood flow. Plaque can also break off and block blood flow in small arteries. Here are some other common causes of PVD: Blood clots inside the blood vessels. Injuries to blood vessels. Irritation and swelling of blood vessels. Sudden tightening, or spasms, of the blood vessel. What increases the risk? Using tobacco or nicotine products. A family history of PVD. Common medical conditions, including: High cholesterol. Diabetes. High blood pressure. Heart disease. Other conditions, such as: Buerger's disease. This is caused by swollen and irritated blood vessels in your hands and feet. Past problems with blocked blood vessels. Kidney disease. Not getting enough exercise. Being very overweight. What are the signs or symptoms? Symptoms depend on what body part isn't getting enough blood. They may include: Cramps in your butt, legs, and feet. Pain and weakness in your legs when you're active that goes away when you rest. Leg pain when at rest along with leg numbness, tingling, or weakness. Coldness in a leg or foot, especially when compared to the other leg or foot. Skin or hair changes on the arms or legs, such as hair loss or  shiny skin. Erectile dysfunction. This means not being able to get or keep an erection. Weak pulse or no pulse in the feet. People with PVD are more likely to get open wounds and sores on their toes, feet, or legs. The sores may take longer than normal to heal. How is this diagnosed? PVD is diagnosed based on your symptoms, a physical exam, and your medical history. You may also have tests to find the cause. These may include: Ankle-brachial index test.This test compares the blood pressure readings of the legs and arms. Doppler ultrasound. This takes pictures of blood flow through your blood vessels. Imaging tests that use dye to show blood flow. These are: CT angiogram. Magnetic resonance angiogram, or MRA. How is this treated? The cause of your PVD is treated first. Other conditions, like diabetes or high cholesterol, are also treated. Treatment may include: Quitting smoking or tobacco use. Exercise programs that your health care provider says are right for you. Taking medicines, such as: Blood thinners to prevent blood clots. Medicines to improve blood flow. Medicines to improve cholesterol levels. Procedures to: Open a blocked artery and restore blood flow. This is called angioplasty. Make a new path for blood to flow around a blocked artery. This is called peripheral bypass surgery. Remove an affected leg or arm. This may be needed in cases of acute limb ischemia when other treatments haven't helped. Follow these instructions at home: Medicines Take your medicines only as told. If you're taking blood thinners: Take your medicines as told. Take them at the same time each day. Talk with  your provider before taking any products you can buy at the store. Do not do things that could hurt or bruise you. Be careful to avoid falls. Wear an alert bracelet or carry a card that says you take blood thinners. Lifestyle  Do not smoke, vape, or use nicotine or tobacco. Get regular exercise.  Ask your provider about good activities for you. Eat a diet that's low in fat and cholesterol. Talk with your provider about staying at a healthy weight. If needed, ask about losing weight. Do not drink alcohol. General instructions Take good care of your feet. To do this: Wear shoes that fit well and feel good. Check your feet often for any cuts or sores. Get vaccines as recommended by your provider. Where to find more information American Heart Association: heart.org National Heart, Lung, and Blood Institute: [employer].gl Contact a health care provider if: You have cramps in your legs when you walk. You have leg pain when you're resting. Your leg or foot feels cold. Your skin changes color. You can't get or keep an erection. You have cuts or sores on your legs or feet that don't heal. Get help right away if: You have chest pain or trouble breathing. You have sudden changes in the color and feeling of your arms or legs, such as: Your arm or leg turns cold, numb, and blue. Your arm or leg becomes red, warm, swollen, painful, or numb. You have any signs of a stroke. "BE FAST" is an easy way to remember the main warning signs: B - Balance. Feeling dizzy, sudden trouble walking, or loss of balance. E - Eyes. Trouble seeing or a change in how you see. F - Face. Sudden weakness or feeling numb in the face. The face or eyelid may droop on one side. A - Arms. Weakness or loss of feeling in an arm. This happens fast and often only on one side. S - Speech. Sudden trouble speaking, slurred speech, or trouble understanding what people say. T - Time. Time to call 911. Write down what time symptoms started. Other signs of a stroke can be: A sudden, very bad headache with no known cause. Feeling like you may throw up. Throwing up. These symptoms may be an emergency. Call 911 right away. Do not wait to see if the symptoms will go away. Do not drive yourself to the hospital. This information  is not intended to replace advice given to you by your health care provider. Make sure you discuss any questions you have with your health care provider. Document Revised: 04/14/2023 Document Reviewed: 11/11/2022 Elsevier Patient Education  2024 [employer].

## 2024-02-09 ENCOUNTER — Ambulatory Visit: Payer: Self-pay | Admitting: Family

## 2024-02-09 LAB — MICROALBUMIN / CREATININE URINE RATIO
Creatinine, Urine: 165.3 mg/dL
Microalb/Creat Ratio: 160 mg/g{creat} — ABNORMAL HIGH (ref 0–29)
Microalbumin, Urine: 264.6 ug/mL

## 2024-02-09 LAB — CBC WITH DIFFERENTIAL/PLATELET
Basophils Absolute: 0.1 x10E3/uL (ref 0.0–0.2)
Basos: 1 %
EOS (ABSOLUTE): 0.2 x10E3/uL (ref 0.0–0.4)
Eos: 3 %
Hematocrit: 43.8 % (ref 37.5–51.0)
Hemoglobin: 14.3 g/dL (ref 13.0–17.7)
Immature Grans (Abs): 0 x10E3/uL (ref 0.0–0.1)
Immature Granulocytes: 0 %
Lymphocytes Absolute: 2.8 x10E3/uL (ref 0.7–3.1)
Lymphs: 44 %
MCH: 29.7 pg (ref 26.6–33.0)
MCHC: 32.6 g/dL (ref 31.5–35.7)
MCV: 91 fL (ref 79–97)
Monocytes Absolute: 0.7 x10E3/uL (ref 0.1–0.9)
Monocytes: 12 %
Neutrophils Absolute: 2.5 x10E3/uL (ref 1.4–7.0)
Neutrophils: 40 %
Platelets: 114 x10E3/uL — ABNORMAL LOW (ref 150–450)
RBC: 4.82 x10E6/uL (ref 4.14–5.80)
RDW: 12.8 % (ref 11.6–15.4)
WBC: 6.4 x10E3/uL (ref 3.4–10.8)

## 2024-02-09 LAB — CMP14+EGFR
ALT: 15 IU/L (ref 0–44)
AST: 20 IU/L (ref 0–40)
Albumin: 3.7 g/dL — AB (ref 3.8–4.8)
Alkaline Phosphatase: 61 IU/L (ref 44–121)
BUN/Creatinine Ratio: 15 (ref 10–24)
BUN: 14 mg/dL (ref 8–27)
Bilirubin Total: 0.6 mg/dL (ref 0.0–1.2)
CO2: 25 mmol/L (ref 20–29)
Calcium: 9 mg/dL (ref 8.6–10.2)
Chloride: 106 mmol/L (ref 96–106)
Creatinine, Ser: 0.93 mg/dL (ref 0.76–1.27)
Globulin, Total: 2.4 g/dL (ref 1.5–4.5)
Glucose: 105 mg/dL — AB (ref 70–99)
Potassium: 3.8 mmol/L (ref 3.5–5.2)
Sodium: 144 mmol/L (ref 134–144)
Total Protein: 6.1 g/dL (ref 6.0–8.5)
eGFR: 83 mL/min/1.73 (ref >59)

## 2024-02-11 ENCOUNTER — Telehealth: Payer: Self-pay

## 2024-02-11 MED ORDER — DAPAGLIFLOZIN PROPANEDIOL 5 MG PO TABS: 5.0000 mg | ORAL_TABLET | Freq: Every day | ORAL | 1 refills | Status: AC

## 2024-02-11 NOTE — Telephone Encounter (Signed)
 Copied from CRM [id number]. Topic: Clinical - Prescription Issue >> Feb 11, 2024  2:08 PM Emylou G wrote: Reason for CRM: Patient called.. said ok for [facility], [place] - [address] for his new med: Farxiga  but wants to know if he needs to take it now.. then he can pick up 30 day from [id number] - [place], [place] - [address]

## 2024-02-12 NOTE — Telephone Encounter (Signed)
 He can wait til until it arrives from mail order pharmacy.

## 2024-02-12 NOTE — Telephone Encounter (Signed)
 Tried calling patient to make patient aware of PCP advise on his medication but no answer and no option to leave a message. If patient calls back, let him know that his PCP says he doesn't have to take the medicine now. He can wait until he gets the other medicine in the mail and then start taking them.

## 2024-02-16 ENCOUNTER — Ambulatory Visit (INDEPENDENT_AMBULATORY_CARE_PROVIDER_SITE_OTHER): Admitting: *Deleted

## 2024-02-16 DIAGNOSIS — E538 Deficiency of other specified B group vitamins: Secondary | ICD-10-CM | POA: Diagnosis not present

## 2024-02-16 NOTE — Progress Notes (Signed)
 Patient is in office today for a nurse visit for B12 Injection. Patient Injection was given in the  Left deltoid. Patient tolerated injection well.

## 2024-03-09 ENCOUNTER — Encounter: Payer: Self-pay | Admitting: Physical Medicine & Rehabilitation

## 2024-03-09 ENCOUNTER — Encounter: Attending: Physical Medicine & Rehabilitation | Admitting: Physical Medicine & Rehabilitation

## 2024-03-09 VITALS — BP 111/76 | HR 79 | Ht 66.0 in | Wt 223.4 lb

## 2024-03-09 DIAGNOSIS — M170 Bilateral primary osteoarthritis of knee: Secondary | ICD-10-CM | POA: Insufficient documentation

## 2024-03-09 MED ORDER — TRIAMCINOLONE ACETONIDE 32 MG IX SRER
32.0000 mg | Freq: Once | INTRA_ARTICULAR | Status: AC
  Administered 2024-03-09: 32 mg via INTRA_ARTICULAR

## 2024-03-09 NOTE — Patient Instructions (Signed)
 ALWAYS FEEL FREE TO CALL OUR OFFICE WITH ANY PROBLEMS OR QUESTIONS [phone])  **PLEASE NOTE** ALL MEDICATION REFILL REQUESTS (INCLUDING CONTROLLED SUBSTANCES) NEED TO BE MADE AT LEAST 7 DAYS PRIOR TO REFILL BEING DUE. ANY REFILL REQUESTS INSIDE THAT TIME FRAME MAY RESULT IN DELAYS IN RECEIVING YOUR PRESCRIPTION.

## 2024-03-09 NOTE — Progress Notes (Signed)
 PROCEDURE NOTE  DIAGNOSIS:  Primary OA of both knees (left-side today)  INTERVENTION:   ZILRETTA  INJECTION     After informed consent and preparation of the skin with betadine and isopropyl alcohol, I injected 32MG  of zilretta  dissolved into 5cc of diluent  into left knee via anterolateral approach. Contents of syring were shaken vigorously and aspiration was performed prior to injection. The patient tolerated well, and no complications were encountered. Afterward the area was cleaned and dressed. Post- injection instructions were provided including ice  if swelling or pain should occur.    Arthea IVAR Gunther, MD, [facility] 03/09/2024     'He's also complaining of tingling in both of his feet as well as aching tingling pain along his legs and thighs.  He has known lumbar spondylosis as well as peripheral neuropathy.  He is interested in assessing the neuropathic symptoms to determine severity and potential treatment options.

## 2024-03-18 ENCOUNTER — Ambulatory Visit: Admitting: *Deleted

## 2024-03-18 DIAGNOSIS — E538 Deficiency of other specified B group vitamins: Secondary | ICD-10-CM

## 2024-03-18 DIAGNOSIS — Z23 Encounter for immunization: Secondary | ICD-10-CM | POA: Diagnosis not present

## 2024-03-18 NOTE — Progress Notes (Signed)
 Patient is in office today for a nurse visit for B12 Injection. Patient Injection was given in the  Right deltoid. Patient tolerated injection well.

## 2024-04-05 ENCOUNTER — Telehealth: Payer: Self-pay | Admitting: Family

## 2024-04-05 NOTE — Telephone Encounter (Signed)
 I called pt & N/A to confirm appt for AWV tomorrow. If pt calls back just remind him of his telephone call from nurse & he does not come into office.

## 2024-04-06 ENCOUNTER — Ambulatory Visit

## 2024-04-06 VITALS — BP 111/76 | HR 79 | Ht 66.0 in | Wt 223.0 lb

## 2024-04-06 DIAGNOSIS — Z0000 Encounter for general adult medical examination without abnormal findings: Secondary | ICD-10-CM | POA: Diagnosis not present

## 2024-04-06 NOTE — Progress Notes (Signed)
 Subjective:   Lonnie Hurley is a 83 y.o. who presents for a Medicare Wellness preventive visit.  As a reminder, Annual Wellness Visits don't include a physical exam, and some assessments may be limited, especially if this visit is performed virtually. We may recommend an in-person follow-up visit with your provider if needed.  Visit Complete: Virtual I connected with  Lonnie Hurley on 04/06/24 by a audio enabled telemedicine application and verified that I am speaking with the correct person using two identifiers.  Patient Location: Home  Provider Location: Home Office  I discussed the limitations of evaluation and management by telemedicine. The patient expressed understanding and agreed to proceed.  Vital Signs: Because this visit was a virtual/telehealth visit, some criteria may be missing or patient reported. Any vitals not documented were not able to be obtained and vitals that have been documented are patient reported.  VideoDeclined- This patient declined Librarian, academic. Therefore the visit was completed with audio only.  Persons Participating in Visit: Patient.  AWV Questionnaire: No: Patient Medicare AWV questionnaire was not completed prior to this visit.  Cardiac Risk Factors include: advanced age (>43men, >11 women);dyslipidemia;male gender;obesity (BMI >30kg/m2);smoking/ tobacco exposure     Objective:    Today's Vitals   04/06/24 1104  BP: 111/76  Pulse: 79  Weight: 223 lb (101.2 kg)  Height: 5' 6 (1.676 m)   Body mass index is 35.99 kg/m.     04/06/2024   11:26 AM 10/31/2022    8:59 AM 10/21/2021    1:29 PM 10/18/2020    1:32 PM 10/17/2019   11:42 AM 10/12/2019    7:25 PM 10/15/2018    2:28 PM  Advanced Directives  Does Patient Have a Medical Advance Directive? No Yes Yes Yes Yes Yes No  Type of Special educational needs teacher of [place];Living will Healthcare Power of [place];Living will Healthcare Power of  [place];Living will Living will;Healthcare Power of Attorney    Does patient want to make changes to medical advance directive?     No - Patient declined    Copy of Healthcare Power of Attorney in Chart?  No - copy requested No - copy requested No - copy requested No - copy requested    Would patient like information on creating a medical advance directive?       No - Patient declined      Data saved with a previous flowsheet row definition    Current Medications (verified) Outpatient Encounter Medications as of 04/06/2024  Medication Sig   acetaminophen  (TYLENOL ) 500 MG tablet Take 1 tablet (500 mg total) by mouth every 6 (six) hours as needed.   benzonatate  (TESSALON ) 200 MG capsule Take 1 capsule (200 mg total) by mouth 2 (two) times daily as needed for cough.   Calcium Carb-Cholecalciferol (CALCIUM 600 + D PO) Take by mouth 2 (two) times daily.    EPINEPHrine  0.3 mg/0.3 mL IJ SOAJ injection Inject 0.3 mg into the muscle as needed for anaphylaxis.   ezetimibe  (ZETIA ) 10 MG tablet Take 1 tablet (10 mg total) by mouth daily.   gabapentin  (NEURONTIN ) 100 MG capsule Take 1 capsule (100 mg total) by mouth at bedtime.   Glucosamine-Chondroit-Vit C-Mn (GLUCOSAMINE 1500 COMPLEX PO) Take by mouth 2 (two) times daily.    glucose blood (ONETOUCH ULTRA) test strip Test BS daily Dx E11.9   hydrOXYzine  (VISTARIL ) 25 MG capsule Take 1 capsule (25 mg total) by mouth every 8 (eight) hours as needed.   Multiple  Vitamins-Minerals (MULTIVITAMIN WITH MINERALS) tablet Take 1 tablet by mouth daily.   mupirocin ointment (BACTROBAN) 2 % SMARTSIG:sparingly Topical Daily   polyethylene glycol powder (MIRALAX ) 17 GM/SCOOP powder Take 17 g by mouth as needed for moderate constipation.   ramipril  (ALTACE ) 10 MG capsule Take 1 capsule (10 mg total) by mouth daily.   simvastatin  (ZOCOR ) 40 MG tablet Take 1 tablet (40 mg total) by mouth at bedtime.   tamsulosin  (FLOMAX ) 0.4 MG CAPS capsule Take 2 capsules (0.8 mg total)  by mouth at bedtime.   Vibegron (GEMTESA) 75 MG TABS Take 1 tablet by mouth as needed (urinary leakage).   dapagliflozin  propanediol (FARXIGA ) 5 MG TABS tablet Take 1 tablet (5 mg total) by mouth daily before breakfast. (Patient not taking: Reported on 04/06/2024)   Facility-Administered Encounter Medications as of 04/06/2024  Medication   cyanocobalamin  ((VITAMIN B-12)) injection 1,000 mcg    Allergies (verified) Clavulanic acid, Diclofenac , Penicillins, Aspirin, Crestor [rosuvastatin calcium], Gabapentin , Hydrocodone, and Lyrica  [pregabalin ]   History: Past Medical History:  Diagnosis Date   BPH (benign prostatic hypertrophy)    Dr. Nicholaus / Dr. Kathlynn  - Urologist    [facility])    lip-mole surgery   Cataract    bilateral   Colon polyps    Constipation    occasional - miralax  prn   DDD (degenerative disc disease)    Hearing loss    left ear, no hearing aids   Hyperlipidemia    Hypertension    Leukoplakia    Microscopic hematuria    negative work up with Urology in the past.    OAB (overactive bladder)    with past percutaneous tibial nerve stimulation therapy   Thrombocytopenia    Tinnitus    Tobacco abuse    Vitamin B 12 deficiency    Vitamin D  deficiency    Past Surgical History:  Procedure Laterality Date   CATARACT EXTRACTION  2007   right eye   CATARACT EXTRACTION Left 03/21/15   COLONOSCOPY  02/28/2014   HERNIA REPAIR  1980   KNEE SURGERY  02-28-2008   replaced inside right knee   Left Knee Repair  11-10-2006   NOSE SURGERY     POLYPECTOMY     colon polyps   SKIN BIOPSY  10-08-2006   left ear   Family History  Problem Relation Age of Onset   Heart failure Father    Glaucoma Mother    Osteoporosis Brother    Heart disease Brother 37       stent   Hypertension Brother    Hyperlipidemia Brother    Diabetes Brother    Colon cancer Neg Hx    Stomach cancer Neg Hx    Rectal cancer Neg Hx    Social History   Socioeconomic History   Marital status:  Single    Spouse name: Not on file   Number of children: 0   Years of education: 10   Highest education level: 10th grade  Occupational History   Occupation: Retired    Comment: retired Chartered loss adjuster  Tobacco Use   Smoking status: Former    Current packs/day: 0.00    Average packs/day: 1 pack/day for 50.0 years (50.0 ttl pk-yrs)    Types: Cigarettes    Start date: 08/15/1959    Quit date: 08/14/2009    Years since quitting: 14.6   Smokeless tobacco: Never  Vaping Use   Vaping status: Never Used  Substance and Sexual Activity   Alcohol  use: No   Drug use: No   Sexual activity: Not Currently  Other Topics Concern   Not on file  Social History Narrative   Retired Chartered loss adjuster   Lives at home with Brother, Desiderio   Social Drivers of Health   Financial Resource Strain: Low Risk  (04/06/2024)   Overall Financial Resource Strain (CARDIA)    Difficulty of Paying Living Expenses: Not hard at all  Food Insecurity: No Food Insecurity (04/06/2024)   Hunger Vital Sign    Worried About Running Out of Food in the Last Year: Never true    Ran Out of Food in the Last Year: Never true  Transportation Needs: No Transportation Needs (04/06/2024)   PRAPARE - Administrator, Civil Service (Medical): No    Lack of Transportation (Non-Medical): No  Physical Activity: Insufficiently Active (04/06/2024)   Exercise Vital Sign    Days of Exercise per Week: 3 days    Minutes of Exercise per Session: 30 min  Stress: No Stress Concern Present (04/06/2024)   [employer] of Occupational Health - Occupational Stress Questionnaire    Feeling of Stress: Only a little  Social Connections: Moderately Isolated (04/06/2024)   Social Connection and Isolation Panel    Frequency of Communication with Friends and Family: More than three times a week    Frequency of Social Gatherings with Friends and Family: More than three times a week    Attends Religious Services:  More than 4 times per year    Active Member of [employer] or Organizations: No    Attends Engineer, structural: Never    Marital Status: Never married    Tobacco Counseling Counseling given: Yes    Clinical Intake:  Pre-visit preparation completed: Yes  Pain : No/denies pain     BMI - recorded: 35.99 Nutritional Status: BMI > 30  Obese Nutritional Risks: None Diabetes: No  Lab Results  Component Value Date   HGBA1C 5.5 02/08/2024   HGBA1C 5.9 (H) 10/08/2023   HGBA1C 6.2 (H) 02/05/2023     How often do you need to have someone help you when you read instructions, pamphlets, or other written materials from your doctor or pharmacy?: 1 - Never  Interpreter Needed?: No  Information entered by :: alia t/cma   Activities of Daily Living     04/06/2024   11:16 AM  In your present state of health, do you have any difficulty performing the following activities:  Hearing? 1  Vision? 0  Difficulty concentrating or making decisions? 0  Walking or climbing stairs? 0  Dressing or bathing? 0  Doing errands, shopping? 0  Preparing Food and eating ? N  Using the Toilet? N  In the past six months, have you accidently leaked urine? Y  Do you have problems with loss of bowel control? N  Managing your Medications? N  Managing your Finances? N  Housekeeping or managing your Housekeeping? N    Patient Care Team: Lavell Bari LABOR, FNP as PCP - General (Family Medicine) Twana Jeneal DASEN, MD (Hematology and Oncology) Watt Rush, MD as Attending Physician (Urology) Williford, Manus HERO, MD as Consulting Physician (Dermatology) Octavia Bruckner, MD as Consulting Physician (Ophthalmology) Unice Pac, MD as Consulting Physician (Neurosurgery)  I have updated your Care Teams any recent Medical Services you may have received from other providers in the past year.     Assessment:   This is a routine wellness examination for [place].  Hearing/Vision screen Hearing Screening  -  Comments:: Pt have some hearing dif Vision Screening - Comments:: Pt denies vision/pt goes to Dr. Octavia in [facility] in [place], [place]/upcoming appt   Goals Addressed   None    Depression Screen     04/06/2024   11:27 AM 02/08/2024    2:55 PM 12/09/2023    2:32 PM 09/09/2023    2:22 PM 05/13/2023    2:49 PM 04/15/2023    1:56 PM 02/11/2023    1:51 PM  PHQ 2/9 Scores  PHQ - 2 Score 1 1 0 0 0 0 0  PHQ- 9 Score 2 6 0        Fall Risk     04/06/2024   11:06 AM 02/08/2024    2:55 PM 12/09/2023    2:32 PM 09/09/2023    2:22 PM 07/22/2023    3:12 PM  Fall Risk   Falls in the past year? 0 0 0 0 0  Number falls in past yr: 0 0  0   Injury with Fall? 0 0  0   Risk for fall due to : No Fall Risks No Fall Risks     Follow up Falls evaluation completed Falls evaluation completed       MEDICARE RISK AT HOME:  Medicare Risk at Home Any stairs in or around the home?: Yes If so, are there any without handrails?: Yes Home free of loose throw rugs in walkways, pet beds, electrical cords, etc?: Yes Adequate lighting in your home to reduce risk of falls?: Yes Life alert?: No Use of a cane, walker or w/c?: No Grab bars in the bathroom?: Yes Shower chair or bench in shower?: No Elevated toilet seat or a handicapped toilet?: Yes  TIMED UP AND GO:  Was the test performed?  no  Cognitive Function: 6CIT completed    08/31/2017    2:43 PM 08/25/2016    9:21 AM 10/25/2015    9:39 AM  MMSE - Mini Mental State Exam  Orientation to time 5 5  5    Orientation to Place 5 5  5    Registration 3 3  3    Attention/ Calculation 5 5  4    Recall 3 3  3    Language- name 2 objects 2 2  2    Language- repeat 1 1 1   Language- follow 3 step command 3 3  3    Language- read & follow direction 1 1  1    Write a sentence 1 1  1    Copy design 1 1  0   Total score 30 30  28       Data saved with a previous flowsheet row definition        10/31/2022    8:59 AM 10/21/2021    1:25 PM 10/17/2019   11:48 AM  10/15/2018    2:29 PM  6CIT Screen  What Year? 0 points 0 points 0 points 0 points  What month? 0 points 0 points 0 points 0 points  What time? 0 points 0 points 0 points 0 points  Count back from 20 0 points 0 points 0 points 0 points  Months in reverse 0 points 0 points 0 points 0 points  Repeat phrase 0 points 4 points 0 points 0 points  Total Score 0 points 4 points 0 points 0 points    Immunizations Immunization History  Administered Date(s) Administered   Fluad Quad(high Dose 65+) 03/07/2019, 03/19/2020, 03/20/2021, 03/12/2022   Fluad Trivalent(High Dose 65+) 03/16/2023   INFLUENZA, HIGH  DOSE SEASONAL PF 03/13/2016, 03/17/2017, 03/15/2018, 03/18/2024   Influenza,inj,Quad PF,6+ Mos 03/14/2014, 03/16/2015   PFIZER(Purple Top)SARS-COV-2 Vaccination 08/09/2019, 08/30/2019, 03/22/2020, 10/15/2020   Pneumococcal Conjugate-13 12/29/2013   Pneumococcal Polysaccharide-23 03/16/2009   Rabies, IM 01/10/2013, 01/17/2013, 01/24/2013, 01/31/2013   Respiratory Syncytial Virus Vaccine,Recomb Aduvanted(Arexvy) 04/18/2022   Tdap 01/10/2013, 02/27/2023   Zoster Recombinant(Shingrix) 09/01/2017, 11/12/2017   Zoster, Live 03/17/2011    Screening Tests Health Maintenance  Topic Date Due   Medicare Annual Wellness (AWV)  10/31/2023   COVID-19 Vaccine (5 - 2025-26 season) 02/15/2024   OPHTHALMOLOGY EXAM  05/28/2024   HEMOGLOBIN A1C  08/10/2024   Diabetic kidney evaluation - eGFR measurement  02/07/2025   Diabetic kidney evaluation - Urine ACR  02/07/2025   FOOT EXAM  02/07/2025   Pneumococcal Vaccine: 50+ Years  Completed   Influenza Vaccine  Completed   Zoster Vaccines- Shingrix  Completed   Meningococcal B Vaccine  Aged Out   Lung Cancer Screening  Discontinued   DTaP/Tdap/Td  Discontinued   Colonoscopy  Discontinued   Hepatitis C Screening  Discontinued    Health Maintenance Items Addressed: See Nurse Notes at the end of this note  Additional Screening:  Vision Screening:  Recommended annual ophthalmology exams for early detection of glaucoma and other disorders of the eye. Is the patient up to date with their annual eye exam?  Yes  Who is the provider or what is the name of the office in which the patient attends annual eye exams? Dr. Octavia in [place], [place]  Dental Screening: Recommended annual dental exams for proper oral hygiene  Community Resource Referral / Chronic Care Management: CRR required this visit?  No   CCM required this visit?  No   Plan:    I have personally reviewed and noted the following in the patient's chart:   Medical and social history Use of alcohol, tobacco or illicit drugs  Current medications and supplements including opioid prescriptions. Patient is not currently taking opioid prescriptions. Functional ability and status Nutritional status Physical activity Advanced directives List of other physicians Hospitalizations, surgeries, and ER visits in previous 12 months Vitals Screenings to include cognitive, depression, and falls Referrals and appointments  In addition, I have reviewed and discussed with patient certain preventive protocols, quality metrics, and best practice recommendations. A written personalized care plan for preventive services as well as general preventive health recommendations were provided to patient.   Ozie Ned, CMA   04/06/2024   After Visit Summary: (MyChart) Due to this being a telephonic visit, the after visit summary with patients personalized plan was offered to patient via MyChart   Notes: Nothing significant to report at this time.

## 2024-04-06 NOTE — Patient Instructions (Signed)
 Mr. Awbrey,  Thank you for taking the time for your Medicare Wellness Visit. I appreciate your continued commitment to your health goals. Please review the care plan we discussed, and feel free to reach out if I can assist you further.  Medicare recommends these wellness visits once per year to help you and your care team stay ahead of potential health issues. These visits are designed to focus on prevention, allowing your provider to concentrate on managing your acute and chronic conditions during your regular appointments.  Please note that Annual Wellness Visits do not include a physical exam. Some assessments may be limited, especially if the visit was conducted virtually. If needed, we may recommend a separate in-person follow-up with your provider.  Ongoing Care Seeing your primary care provider every 3 to 6 months helps us  monitor your health and provide consistent, personalized care.   Referrals If a referral was made during today's visit and you haven't received any updates within two weeks, please contact the referred provider directly to check on the status.  Recommended Screenings:  Health Maintenance  Topic Date Due   Medicare Annual Wellness Visit  10/31/2023   COVID-19 Vaccine (5 - 2025-26 season) 02/15/2024   Eye exam for diabetics  05/28/2024   Hemoglobin A1C  08/10/2024   Yearly kidney function blood test for diabetes  02/07/2025   Yearly kidney health urinalysis for diabetes  02/07/2025   Complete foot exam   02/07/2025   Pneumococcal Vaccine for age over 20  Completed   Flu Shot  Completed   Zoster (Shingles) Vaccine  Completed   Meningitis B Vaccine  Aged Out   Screening for Lung Cancer  Discontinued   DTaP/Tdap/Td vaccine  Discontinued   Colon Cancer Screening  Discontinued   Hepatitis C Screening  Discontinued       04/06/2024   11:26 AM  Advanced Directives  Does Patient Have a Medical Advance Directive? No   Advance Care Planning is important because  it: Ensures you receive medical care that aligns with your values, goals, and preferences. Provides guidance to your family and loved ones, reducing the emotional burden of decision-making during critical moments.  Vision: Annual vision screenings are recommended for early detection of glaucoma, cataracts, and diabetic retinopathy. These exams can also reveal signs of chronic conditions such as diabetes and high blood pressure.  Dental: Annual dental screenings help detect early signs of oral cancer, gum disease, and other conditions linked to overall health, including heart disease and diabetes.  Please see the attached documents for additional preventive care recommendations.

## 2024-04-19 ENCOUNTER — Ambulatory Visit (INDEPENDENT_AMBULATORY_CARE_PROVIDER_SITE_OTHER): Payer: Self-pay | Admitting: *Deleted

## 2024-04-19 DIAGNOSIS — E538 Deficiency of other specified B group vitamins: Secondary | ICD-10-CM | POA: Diagnosis not present

## 2024-04-19 NOTE — Progress Notes (Signed)
 Patient is in office today for a nurse visit for B12 Injection. Patient Injection was given in the  Left deltoid. Patient tolerated injection well.

## 2024-05-02 DIAGNOSIS — L570 Actinic keratosis: Secondary | ICD-10-CM | POA: Diagnosis not present

## 2024-05-02 DIAGNOSIS — Z85828 Personal history of other malignant neoplasm of skin: Secondary | ICD-10-CM | POA: Diagnosis not present

## 2024-05-02 DIAGNOSIS — L218 Other seborrheic dermatitis: Secondary | ICD-10-CM | POA: Diagnosis not present

## 2024-05-02 DIAGNOSIS — L821 Other seborrheic keratosis: Secondary | ICD-10-CM | POA: Diagnosis not present

## 2024-05-10 ENCOUNTER — Encounter: Admitting: Physical Medicine & Rehabilitation

## 2024-05-16 ENCOUNTER — Other Ambulatory Visit: Payer: Self-pay | Admitting: Family

## 2024-05-19 ENCOUNTER — Ambulatory Visit: Admitting: *Deleted

## 2024-05-19 DIAGNOSIS — E538 Deficiency of other specified B group vitamins: Secondary | ICD-10-CM

## 2024-05-19 NOTE — Progress Notes (Signed)
 Patient is in office today for a nurse visit for B12 Injection. Injection was given in the  Left deltoid. Patient tolerated injection well.

## 2024-05-23 DIAGNOSIS — N200 Calculus of kidney: Secondary | ICD-10-CM | POA: Diagnosis not present

## 2024-05-23 DIAGNOSIS — R3121 Asymptomatic microscopic hematuria: Secondary | ICD-10-CM | POA: Diagnosis not present

## 2024-05-23 DIAGNOSIS — N3941 Urge incontinence: Secondary | ICD-10-CM | POA: Diagnosis not present

## 2024-05-23 DIAGNOSIS — N401 Enlarged prostate with lower urinary tract symptoms: Secondary | ICD-10-CM | POA: Diagnosis not present

## 2024-06-06 ENCOUNTER — Telehealth: Payer: Self-pay | Admitting: Pharmacist

## 2024-06-06 DIAGNOSIS — E785 Hyperlipidemia, unspecified: Secondary | ICD-10-CM

## 2024-06-06 DIAGNOSIS — G720 Drug-induced myopathy: Secondary | ICD-10-CM | POA: Insufficient documentation

## 2024-06-06 LAB — OPHTHALMOLOGY REPORT-SCANNED

## 2024-06-06 MED ORDER — SIMVASTATIN 40 MG PO TABS: 40.0000 mg | ORAL_TABLET | Freq: Every day | ORAL | 4 refills | Status: DC

## 2024-06-06 NOTE — Telephone Encounter (Signed)
" ° °  This patient is appearing on a report for being at risk of failing the adherence measure for cholesterol (statin) medications this calendar year.   Medication: simvastatin  Past myalgias to rosuvastatin (simvastatin  is likely not the best statin) Will f/u pharmD [id number]   Contacted pharmacy to facilitate refills. and Will collaborate with provider to facilitate refill needs.   Elliot Simoneaux Dattero Nekita Pita, PharmD, BCACP, CPP Clinical Pharmacist, [facility]  "

## 2024-06-10 ENCOUNTER — Telehealth: Payer: Self-pay

## 2024-06-10 NOTE — Progress Notes (Signed)
 Complex Care Management Note  Care Guide Note 06/10/2024 Name: [name] MRN: [id number] DOB: [birth year]  Lonnie Hurley is a 83 y.o. year old male who sees Lavell Bari LABOR, FNP for primary care. I reached out to [name] by phone today to offer complex care management services.  Mr. Flori was given information about Complex Care Management services today including:   The Complex Care Management services include support from the care team which includes your Nurse Care Manager, Clinical Social Worker, or Pharmacist.  The Complex Care Management team is here to help remove barriers to the health concerns and goals most important to you. Complex Care Management services are voluntary, and the patient may decline or stop services at any time by request to their care team member.   Complex Care Management Consent Status: Patient agreed to services and verbal consent obtained.   Follow up plan:  Telephone appointment with complex care management team member scheduled for:  06/30/2023  Encounter Outcome:  Patient Scheduled  Jeoffrey Buffalo , RMA     [facility], [facility] Guide  Direct Dial: [phone]  Website: [email or web address]

## 2024-06-13 ENCOUNTER — Encounter: Payer: Self-pay | Admitting: Family

## 2024-06-13 ENCOUNTER — Ambulatory Visit (INDEPENDENT_AMBULATORY_CARE_PROVIDER_SITE_OTHER): Payer: Self-pay | Admitting: Family

## 2024-06-13 VITALS — BP 134/73 | HR 64 | Temp 98.0°F | Ht 66.0 in | Wt 233.0 lb

## 2024-06-13 DIAGNOSIS — R600 Localized edema: Secondary | ICD-10-CM | POA: Diagnosis not present

## 2024-06-13 DIAGNOSIS — M48061 Spinal stenosis, lumbar region without neurogenic claudication: Secondary | ICD-10-CM | POA: Diagnosis not present

## 2024-06-13 DIAGNOSIS — M170 Bilateral primary osteoarthritis of knee: Secondary | ICD-10-CM

## 2024-06-13 DIAGNOSIS — R350 Frequency of micturition: Secondary | ICD-10-CM | POA: Diagnosis not present

## 2024-06-13 DIAGNOSIS — I10 Essential (primary) hypertension: Secondary | ICD-10-CM | POA: Diagnosis not present

## 2024-06-13 DIAGNOSIS — G720 Drug-induced myopathy: Secondary | ICD-10-CM

## 2024-06-13 DIAGNOSIS — N401 Enlarged prostate with lower urinary tract symptoms: Secondary | ICD-10-CM

## 2024-06-13 DIAGNOSIS — I700 Atherosclerosis of aorta: Secondary | ICD-10-CM

## 2024-06-13 DIAGNOSIS — E785 Hyperlipidemia, unspecified: Secondary | ICD-10-CM | POA: Diagnosis not present

## 2024-06-13 MED ORDER — SIMVASTATIN 40 MG PO TABS: 40.0000 mg | ORAL_TABLET | Freq: Every day | ORAL | 3 refills | Status: DC

## 2024-06-13 MED ORDER — SIMVASTATIN 40 MG PO TABS: 40.0000 mg | ORAL_TABLET | Freq: Every day | ORAL | 0 refills | Status: AC

## 2024-06-13 NOTE — Progress Notes (Signed)
 "  Subjective:    Patient ID: Lonnie Hurley, male    DOB: Aug 14, 1940, 83 y.o.   MRN: [id number]  Chief Complaint  Patient presents with   Medical Management of Chronic Issues   Pt presents to the office today for chronic follow up.  He is followed by Urologists every 6 months for BPH.   Followed by Cardiologists annually.    He is followed by Dermatologists every 6 months.   He is followed by Ortho for osteoarthritis for bilateral knee pain and getting steroid injections.    He has aortic atherosclerosis and takes Zetia  and Zocor .  He is morbid obese with a BMI of 37 with HTN and DM.  Hypertension This is a chronic problem. The current episode started more than 1 year ago. The problem has been resolved since onset. The problem is controlled. Associated symptoms include peripheral edema (slightly). Pertinent negatives include no blurred vision, headaches, malaise/fatigue or shortness of breath. Risk factors for coronary artery disease include dyslipidemia, diabetes mellitus, obesity, male gender and sedentary lifestyle. The current treatment provides moderate improvement.  Diabetes He presents for his follow-up diabetic visit. He has type 2 diabetes mellitus. Pertinent negatives for hypoglycemia include no headaches. Associated symptoms include foot paresthesias. Pertinent negatives for diabetes include no blurred vision. Symptoms are stable. Diabetic complications include peripheral neuropathy. Risk factors for coronary artery disease include dyslipidemia, diabetes mellitus, hypertension, male sex, sedentary lifestyle and obesity. He is following a generally unhealthy diet. His overall blood glucose range is 110-130 mg/dl.  Arthritis Presents for follow-up visit. He complains of pain and stiffness. Affected locations include the left knee, right knee, left MCP and right MCP. His pain is at a severity of 4/10.  Benign Prostatic Hypertrophy This is a chronic problem. The current episode  started more than 1 year ago. Irritative symptoms include nocturia (2). Pertinent negatives include no chills. Past treatments include tamsulosin  (vibegron). The treatment provided moderate relief.  Cough This is a chronic problem. The current episode started more than 1 year ago. The problem has been waxing and waning. The problem occurs every few minutes. The cough is Productive of sputum. Associated symptoms include nasal congestion. Pertinent negatives include no chills, ear congestion, ear pain, headaches or shortness of breath. He has tried rest and OTC cough suppressant (zpak) for the symptoms. The treatment provided mild relief.      Review of Systems  Constitutional:  Negative for chills and malaise/fatigue.  HENT:  Negative for ear pain.   Eyes:  Negative for blurred vision.  Respiratory:  Positive for cough. Negative for shortness of breath.   Genitourinary:  Positive for nocturia (2).  Musculoskeletal:  Positive for stiffness.  Neurological:  Negative for headaches.  All other systems reviewed and are negative.  Family History  Problem Relation Age of Onset   Heart failure Father    Glaucoma Mother    Osteoporosis Brother    Heart disease Brother 55       stent   Hypertension Brother    Hyperlipidemia Brother    Diabetes Brother    Colon cancer Neg Hx    Stomach cancer Neg Hx    Rectal cancer Neg Hx    Social History   Socioeconomic History   Marital status: Single    Spouse name: Not on file   Number of children: 0   Years of education: 10   Highest education level: 10th grade  Occupational History   Occupation: Retired    Comment: retired  chartered loss adjuster  Tobacco Use   Smoking status: Former    Current packs/day: 0.00    Average packs/day: 1 pack/day for 50.0 years (50.0 ttl pk-yrs)    Types: Cigarettes    Start date: 08/15/1959    Quit date: 08/14/2009    Years since quitting: 14.8   Smokeless tobacco: Never  Vaping Use   Vaping status: Never  Used  Substance and Sexual Activity   Alcohol use: No   Drug use: No   Sexual activity: Not Currently  Other Topics Concern   Not on file  Social History Narrative   Retired Chartered loss adjuster   Lives at home with Brother, Desiderio   Social Drivers of Health   Tobacco Use: Medium Risk (06/13/2024)   Patient History    Smoking Tobacco Use: Former    Smokeless Tobacco Use: Never    Passive Exposure: Not on Actuary Strain: Low Risk (04/06/2024)   Overall Financial Resource Strain (CARDIA)    Difficulty of Paying Living Expenses: Not hard at all  Food Insecurity: No Food Insecurity (04/06/2024)   Epic    Worried About Radiation Protection Practitioner of Food in the Last Year: Never true    Ran Out of Food in the Last Year: Never true  Transportation Needs: No Transportation Needs (04/06/2024)   Epic    Lack of Transportation (Medical): No    Lack of Transportation (Non-Medical): No  Physical Activity: Insufficiently Active (04/06/2024)   Exercise Vital Sign    Days of Exercise per Week: 3 days    Minutes of Exercise per Session: 30 min  Stress: No Stress Concern Present (04/06/2024)   [employer] of Occupational Health - Occupational Stress Questionnaire    Feeling of Stress: Only a little  Social Connections: Moderately Isolated (04/06/2024)   Social Connection and Isolation Panel    Frequency of Communication with Friends and Family: More than three times a week    Frequency of Social Gatherings with Friends and Family: More than three times a week    Attends Religious Services: More than 4 times per year    Active Member of Clubs or Organizations: No    Attends Banker Meetings: Never    Marital Status: Never married  Depression (PHQ2-9): Low Risk (06/13/2024)   Depression (PHQ2-9)    PHQ-2 Score: 0  Alcohol Screen: Low Risk (04/06/2024)   Alcohol Screen    Last Alcohol Screening Score (AUDIT): 0  Housing: Unknown (04/06/2024)   Epic    Unable to  Pay for Housing in the Last Year: No    Number of Times Moved in the Last Year: Not on file    Homeless in the Last Year: No  Utilities: Not At Risk (04/06/2024)   Epic    Threatened with loss of utilities: No  Health Literacy: Adequate Health Literacy (04/06/2024)   B1300 Health Literacy    Frequency of need for help with medical instructions: Never       Objective:   Physical Exam Vitals reviewed.  Constitutional:      General: He is not in acute distress.    Appearance: He is well-developed. He is obese.  HENT:     Head: Normocephalic.     Right Ear: Tympanic membrane normal.     Left Ear: Tympanic membrane normal.  Eyes:     General:        Right eye: No discharge.        Left eye: No  discharge.     Pupils: Pupils are equal, round, and reactive to light.  Neck:     Thyroid : No thyromegaly.  Cardiovascular:     Rate and Rhythm: Normal rate and regular rhythm.     Heart sounds: Murmur heard.  Pulmonary:     Effort: Pulmonary effort is normal. No respiratory distress.     Breath sounds: Normal breath sounds. No wheezing.     Comments: Dry nonproductive cough Abdominal:     General: Bowel sounds are normal. There is no distension.     Palpations: Abdomen is soft.     Tenderness: There is no abdominal tenderness.  Musculoskeletal:        General: No tenderness. Normal range of motion.     Cervical back: Normal range of motion and neck supple.     Right lower leg: Edema (2+) present.     Left lower leg: Edema (2+) present.  Skin:    General: Skin is warm and dry.     Findings: No erythema or rash.  Neurological:     Mental Status: He is alert and oriented to person, place, and time.     Cranial Nerves: No cranial nerve deficit.     Deep Tendon Reflexes: Reflexes are normal and symmetric.  Psychiatric:        Behavior: Behavior normal.        Thought Content: Thought content normal.        Judgment: Judgment normal.        BP 134/73   Pulse 64   Temp 98 F  (36.7 C) (Temporal)   Ht 5' 6 (1.676 m)   Wt 233 lb (105.7 kg)   SpO2 92%   BMI 37.61 kg/m      Assessment & Plan:   Edmon Magid comes in today with chief complaint of Medical Management of Chronic Issues   Diagnosis and orders addressed:  1. Hyperlipidemia, unspecified hyperlipidemia type - simvastatin  (ZOCOR ) 40 MG tablet; Take 1 tablet (40 mg total) by mouth at bedtime.  Dispense: 30 tablet; Refill: 0 - CMP14+EGFR  2. Drug-induced myopathy - simvastatin  (ZOCOR ) 40 MG tablet; Take 1 tablet (40 mg total) by mouth at bedtime.  Dispense: 30 tablet; Refill: 0 - CMP14+EGFR  3. Primary hypertension (Primary) - CMP14+EGFR  4. Benign prostatic hyperplasia with urinary frequency  - CMP14+EGFR  5. Aortic atherosclerosis - CMP14+EGFR  6. Obesity, morbid (HCC)  - CMP14+EGFR  7. Spinal stenosis of lumbar region, unspecified whether neurogenic claudication present  - CMP14+EGFR  8. Primary osteoarthritis of both knees  - CMP14+EGFR  9. Peripheral edema - CMP14+EGFR    Labs pending Keep follow up with specialists  Continue all medications  Health Maintenance reviewed Diet and exercise encouraged  Follow up plan: 4 months    Bari Learn, FNP  "

## 2024-06-13 NOTE — Patient Instructions (Signed)
 Health Maintenance After Age 81 After age 27, you are at a higher risk for certain long-term diseases and infections as well as injuries from falls. Falls are a major cause of broken bones and head injuries in people who are older than age 73. Getting regular preventive care can help to keep you healthy and well. Preventive care includes getting regular testing and making lifestyle changes as recommended by your health care provider. Talk with your health care provider about: Which screenings and tests you should have. A screening is a test that checks for a disease when you have no symptoms. A diet and exercise plan that is right for you. What should I know about screenings and tests to prevent falls? Screening and testing are the best ways to find a health problem early. Early diagnosis and treatment give you the best chance of managing medical conditions that are common after age 90. Certain conditions and lifestyle choices may make you more likely to have a fall. Your health care provider may recommend: Regular vision checks. Poor vision and conditions such as cataracts can make you more likely to have a fall. If you wear glasses, make sure to get your prescription updated if your vision changes. Medicine review. Work with your health care provider to regularly review all of the medicines you are taking, including over-the-counter medicines. Ask your health care provider about any side effects that may make you more likely to have a fall. Tell your health care provider if any medicines that you take make you feel dizzy or sleepy. Strength and balance checks. Your health care provider may recommend certain tests to check your strength and balance while standing, walking, or changing positions. Foot health exam. Foot pain and numbness, as well as not wearing proper footwear, can make you more likely to have a fall. Screenings, including: Osteoporosis screening. Osteoporosis is a condition that causes  the bones to get weaker and break more easily. Blood pressure screening. Blood pressure changes and medicines to control blood pressure can make you feel dizzy. Depression screening. You may be more likely to have a fall if you have a fear of falling, feel depressed, or feel unable to do activities that you used to do. Alcohol  use screening. Using too much alcohol  can affect your balance and may make you more likely to have a fall. Follow these instructions at home: Lifestyle Do not drink alcohol  if: Your health care provider tells you not to drink. If you drink alcohol : Limit how much you have to: 0-1 drink a day for women. 0-2 drinks a day for men. Know how much alcohol  is in your drink. In the [place] one drink equals one 12 oz bottle of beer (355 mL), one 5 oz glass of wine (148 mL), or one 1 oz glass of hard liquor (44 mL). Do not use any products that contain nicotine or tobacco. These products include cigarettes, chewing tobacco, and vaping devices, such as e-cigarettes. If you need help quitting, ask your health care provider. Activity  Follow a regular exercise program to stay fit. This will help you maintain your balance. Ask your health care provider what types of exercise are appropriate for you. If you need a cane or walker, use it as recommended by your health care provider. Wear supportive shoes that have nonskid soles. Safety  Remove any tripping hazards, such as rugs, cords, and clutter. Install safety equipment such as grab bars in bathrooms and safety rails on stairs. Keep rooms and walkways  well-lit. General instructions Talk with your health care provider about your risks for falling. Tell your health care provider if: You fall. Be sure to tell your health care provider about all falls, even ones that seem minor. You feel dizzy, tiredness (fatigue), or off-balance. Take over-the-counter and prescription medicines only as told by your health care provider. These include  supplements. Eat a healthy diet and maintain a healthy weight. A healthy diet includes low-fat dairy products, low-fat (lean) meats, and fiber from whole grains, beans, and lots of fruits and vegetables. Stay current with your vaccines. Schedule regular health, dental, and eye exams. Summary Having a healthy lifestyle and getting preventive care can help to protect your health and wellness after age 15. Screening and testing are the best way to find a health problem early and help you avoid having a fall. Early diagnosis and treatment give you the best chance for managing medical conditions that are more common for people who are older than age 42. Falls are a major cause of broken bones and head injuries in people who are older than age 64. Take precautions to prevent a fall at home. Work with your health care provider to learn what changes you can make to improve your health and wellness and to prevent falls. This information is not intended to replace advice given to you by your health care provider. Make sure you discuss any questions you have with your health care provider. Document Revised: 10/22/2020 Document Reviewed: 10/22/2020 Elsevier Patient Education  2024 [employer].

## 2024-06-21 ENCOUNTER — Ambulatory Visit

## 2024-06-21 DIAGNOSIS — E538 Deficiency of other specified B group vitamins: Secondary | ICD-10-CM

## 2024-06-21 NOTE — Progress Notes (Signed)
 Patient given b12 injection in right arm tolerated well.

## 2024-06-29 ENCOUNTER — Telehealth: Payer: Self-pay | Admitting: Cardiology

## 2024-06-29 ENCOUNTER — Other Ambulatory Visit (INDEPENDENT_AMBULATORY_CARE_PROVIDER_SITE_OTHER)

## 2024-06-29 DIAGNOSIS — Z79899 Other long term (current) drug therapy: Secondary | ICD-10-CM

## 2024-06-29 NOTE — Telephone Encounter (Signed)
 I tried to call the pt to see if he wanted to schedule a sooner appt with Dr. Lavona for preop clearance. Pt last seen 03/2023.

## 2024-06-29 NOTE — Telephone Encounter (Signed)
" ° °  Name: Lucy Woolever  DOB: 08-18-1940  MRN: [id number]  Primary Cardiologist: None  Chart reviewed as part of pre-operative protocol coverage. Patient has not been seen in our office since 03/2023. Because of Kethan Diers's past medical history and time since last visit, he will require a follow-up in-office visit in order to better assess preoperative cardiovascular risk.  Pre-op covering staff: - Please schedule appointment and call patient to inform them. If patient already had an upcoming appointment within acceptable timeframe, please add pre-op clearance to the appointment notes so provider is aware. - Please contact requesting surgeon's office via preferred method (i.e, phone, fax) to inform them of need for appointment prior to surgery.  He does not appear to be on any anticoagulation/ antiplatelets.   Reniyah Gootee E Krystle Polcyn, PA-C  06/29/2024, 9:00 AM   "

## 2024-06-29 NOTE — Progress Notes (Cosign Needed)
 "  06/29/2024 Name: Lonnie Hurley MRN: [id number] DOB: 09-10-1940  Chief Complaint  Patient presents with   Medication Management    Lonnie Hurley is a 84 y.o. year old male who presented for a telephone visit.   They were referred to the pharmacist by their PCP for assistance in managing medication access.    Subjective:  Patient is having financial difficulties obtaining Gemtesa for urinary incontinence.    Care Team: Primary Care Provider: Lavell Bari LABOR, FNP    Medication Access/Adherence  Current Pharmacy:  [facility] [phone] - [place], [place] - [address] [place] [address] Phone: [phone] Fax: [phone]  [facility] [place], [place] - [address] [place] [address] [place] [address] Phone: [phone] Fax: [phone]  [facility] [place], [place] - [address] [place] [address] Phone: [phone] Fax: [phone]   Patient reports affordability concerns with their medications: Yes  Patient reports access/transportation concerns to their pharmacy: No  Patient reports adherence concerns with their medications:  No     Objective:  Lab Results  Component Value Date   HGBA1C 5.5 02/08/2024    Lab Results  Component Value Date   CREATININE 0.93 02/08/2024   BUN 14 02/08/2024   NA 144 02/08/2024   K 3.8 02/08/2024   CL 106 02/08/2024   CO2 25 02/08/2024    Lab Results  Component Value Date   CHOL 113 10/08/2023   HDL 36 (L) 10/08/2023   LDLCALC 48 10/08/2023   TRIG 173 (H) 10/08/2023   CHOLHDL 3.1 10/08/2023    Medications Reviewed Today     Reviewed by Billee Mliss BIRCH, RPH-CPP (Pharmacist) on 07/22/24 at [phone]  Med List Status: <None>   Medication Order Taking? Sig Documenting Provider Last Dose Status Informant  acetaminophen  (TYLENOL ) 500 MG tablet [id number]  Take 1 tablet (500 mg total) by mouth every 6 (six) hours as needed.  Cherylene Homer HERO, NP  Active   benzonatate  (TESSALON ) 200 MG capsule [id number]  Take 1 capsule (200 mg total) by mouth 2 (two) times daily as needed for cough. Lavell Bari A, FNP  Active   Calcium Carb-Cholecalciferol (CALCIUM 600 + D PO) [id number]  Take by mouth 2 (two) times daily.  [provider]  Active   cyanocobalamin  ((VITAMIN B-12)) injection 1,000 mcg [id number]   Lavell Bari A, FNP  Active   dapagliflozin  propanediol (FARXIGA ) 5 MG TABS tablet [id number]  Take 1 tablet (5 mg total) by mouth daily before breakfast.  Patient not taking: Reported on 06/13/2024   Lavell Bari A, FNP  Active   EPINEPHrine  0.3 mg/0.3 mL IJ SOAJ injection [id number]  Inject 0.3 mg into the muscle as needed for anaphylaxis. Lavell Bari A, FNP  Active   ezetimibe  (ZETIA ) 10 MG tablet [id number]  Take 1 tablet (10 mg total) by mouth daily. Lavell Bari A, FNP  Active   gabapentin  (NEURONTIN ) 100 MG capsule [id number]  Take 1 capsule (100 mg total) by mouth at bedtime. Lavell Bari A, FNP  Active   Glucosamine-Chondroit-Vit C-Mn (GLUCOSAMINE 1500 COMPLEX PO) [id number]  Take by mouth 2 (two) times daily.  [provider]  Active   glucose blood (ONETOUCH ULTRA) test strip [id number]  Test BS daily Dx E11.9 Lavell Bari A, FNP  Active   hydrOXYzine  (VISTARIL ) 25 MG capsule [id number]  Take 1 capsule (25 mg total) by mouth every 8 (  eight) hours as needed. Lavell Bari LABOR, FNP  Active   Multiple Vitamins-Minerals (MULTIVITAMIN WITH MINERALS) tablet [id number]  Take 1 tablet by mouth daily. [provider]  Active Self  mupirocin ointment (BACTROBAN) 2 % [id number]  SMARTSIG:sparingly Topical Daily [provider]  Active   polyethylene glycol powder (MIRALAX ) 17 GM/SCOOP powder [id number]  Take 17 g by mouth as needed for moderate constipation. Joshua Clayborne RAMAN, PA-C  Active   ramipril  (ALTACE ) 10 MG capsule [id number]  Take 1 capsule (10 mg total) by mouth daily. Lavell Bari A,  FNP  Active   simvastatin  (ZOCOR ) 40 MG tablet [id number]  Take 1 tablet (40 mg total) by mouth at bedtime. Lavell Bari A, FNP  Active   tamsulosin  (FLOMAX ) 0.4 MG CAPS capsule [id number]  Take 2 capsules (0.8 mg total) by mouth at bedtime. Hawks, Christy A, FNP  Active   Vibegron (GEMTESA) 75 MG TABS [id number]  Take 1 tablet by mouth as needed (urinary leakage). [provider]  Active               Assessment/Plan:  -reviewed medications with patient -there are no patient assistance programs for Gemtesa at this time -patient does not qualify for LIS/extra help  Follow Up Plan: as needed  [name], PharmD, BCACP, CPP Clinical Pharmacist, [facility]   "

## 2024-06-29 NOTE — Telephone Encounter (Signed)
"  ° °  Pre-operative Risk Assessment    Patient Name: Lonnie Hurley  DOB: 1941-01-18 MRN: [id number]   Date of last office visit: 04/08/23 Date of next office visit:    Request for Surgical Clearance    Procedure:  ESWL (Lithotripsy)  Date of Surgery:  Clearance TBD                                Surgeon:  Dr Norleen Seltzer Surgeon's Group or Practice Name:  [facility]  Phone number:  [phone] Fax number:  [phone]   Type of Clearance Requested:   - Medical    Type of Anesthesia:  IV   Additional requests/questions:    Signed, Kari Fuelling   06/29/2024, 8:50 AM   "

## 2024-07-01 NOTE — Telephone Encounter (Addendum)
 I s/w the pt to offer a sooner appt for preop clearance. Pt said he wants to just keep the appt like he has with Dr. Edison 07/26/24. Pt thanked me for calling him though and asking if he wanted a sooner appt. Pt was made aware the sooner appt would not had been with Dr. Lavona, but one of the APP's . Pt said he likes all of our providers but wants to see Dr. Lavona.   Pt said stone is not bothering him right now.

## 2024-07-05 ENCOUNTER — Other Ambulatory Visit (HOSPITAL_COMMUNITY): Payer: Self-pay

## 2024-07-14 ENCOUNTER — Telehealth: Payer: Self-pay

## 2024-07-14 NOTE — Telephone Encounter (Signed)
 Called patient x4!!  No VM available Will await return call

## 2024-07-14 NOTE — Telephone Encounter (Signed)
 Copied from CRM [id number]. Topic: General - Other >> Jul 14, 2024  8:35 AM Antwanette L wrote: Reason for CRM: The pt is requesting a callback from Julie(pharmacist). I have sent Mliss a message via Teams asking her to contact the pt >> Jul 14, 2024  8:50 AM Antwanette L wrote: The pt is requesting a callback from Julie(pharmacist). I have sent Mliss a message via Teams asking her to contact the pt.The pt can be reached at [phone]

## 2024-07-22 ENCOUNTER — Ambulatory Visit (INDEPENDENT_AMBULATORY_CARE_PROVIDER_SITE_OTHER)

## 2024-07-22 DIAGNOSIS — E538 Deficiency of other specified B group vitamins: Secondary | ICD-10-CM

## 2024-07-22 NOTE — Progress Notes (Signed)
 Patient is in office today for a nurse visit for B12 Injection. Patient Injection was given in the  Left deltoid. Patient tolerated injection well.

## 2024-07-26 ENCOUNTER — Ambulatory Visit: Admitting: Cardiology

## 2024-08-22 ENCOUNTER — Ambulatory Visit

## 2024-12-13 ENCOUNTER — Ambulatory Visit: Admitting: Family

## 2025-04-07 ENCOUNTER — Ambulatory Visit
